# Patient Record
Sex: Female | Born: 1960 | Race: Black or African American | Hispanic: No | Marital: Single | Smoking: Never smoker
Health system: Southern US, Community
[De-identification: ages and names within clinical notes are randomized; demographics above are authoritative.]

## PROBLEM LIST (undated history)

## (undated) DIAGNOSIS — K469 Unspecified abdominal hernia without obstruction or gangrene: Secondary | ICD-10-CM

## (undated) DIAGNOSIS — M1990 Unspecified osteoarthritis, unspecified site: Secondary | ICD-10-CM

## (undated) DIAGNOSIS — F329 Major depressive disorder, single episode, unspecified: Secondary | ICD-10-CM

## (undated) DIAGNOSIS — J40 Bronchitis, not specified as acute or chronic: Secondary | ICD-10-CM

## (undated) DIAGNOSIS — K219 Gastro-esophageal reflux disease without esophagitis: Secondary | ICD-10-CM

## (undated) DIAGNOSIS — G4730 Sleep apnea, unspecified: Secondary | ICD-10-CM

## (undated) DIAGNOSIS — N840 Polyp of corpus uteri: Secondary | ICD-10-CM

## (undated) DIAGNOSIS — E669 Obesity, unspecified: Secondary | ICD-10-CM

## (undated) DIAGNOSIS — E7800 Pure hypercholesterolemia, unspecified: Secondary | ICD-10-CM

## (undated) DIAGNOSIS — I10 Essential (primary) hypertension: Secondary | ICD-10-CM

## (undated) DIAGNOSIS — F32A Depression, unspecified: Secondary | ICD-10-CM

## (undated) HISTORY — PX: CHOLECYSTECTOMY: SHX55

## (undated) HISTORY — PX: DILATION AND CURETTAGE OF UTERUS: SHX78

## (undated) HISTORY — DX: Essential (primary) hypertension: I10

## (undated) HISTORY — DX: Unspecified abdominal hernia without obstruction or gangrene: K46.9

## (undated) HISTORY — PX: UMBILICAL HERNIA REPAIR: SHX196

## (undated) HISTORY — DX: Obesity, unspecified: E66.9

## (undated) HISTORY — DX: Depression, unspecified: F32.A

## (undated) HISTORY — PX: OTHER SURGICAL HISTORY: SHX169

## (undated) HISTORY — DX: Unspecified osteoarthritis, unspecified site: M19.90

## (undated) HISTORY — DX: Gastro-esophageal reflux disease without esophagitis: K21.9

## (undated) HISTORY — DX: Major depressive disorder, single episode, unspecified: F32.9

## (undated) HISTORY — DX: Bronchitis, not specified as acute or chronic: J40

## (undated) HISTORY — DX: Polyp of corpus uteri: N84.0

---

## 1997-05-25 ENCOUNTER — Encounter: Admission: RE | Admit: 1997-05-25 | Discharge: 1997-05-25 | Payer: Self-pay | Admitting: Obstetrics & Gynecology

## 1997-05-25 ENCOUNTER — Other Ambulatory Visit: Admission: RE | Admit: 1997-05-25 | Discharge: 1997-05-25 | Payer: Self-pay | Admitting: Obstetrics & Gynecology

## 1998-12-01 ENCOUNTER — Encounter: Admission: RE | Admit: 1998-12-01 | Discharge: 1998-12-01 | Payer: Self-pay | Admitting: Hematology and Oncology

## 1999-01-16 ENCOUNTER — Inpatient Hospital Stay (HOSPITAL_COMMUNITY): Admission: EM | Admit: 1999-01-16 | Discharge: 1999-01-16 | Payer: Self-pay | Admitting: *Deleted

## 1999-02-28 ENCOUNTER — Encounter: Admission: RE | Admit: 1999-02-28 | Discharge: 1999-02-28 | Payer: Self-pay | Admitting: Obstetrics & Gynecology

## 1999-04-24 ENCOUNTER — Ambulatory Visit (HOSPITAL_COMMUNITY): Admission: RE | Admit: 1999-04-24 | Discharge: 1999-04-24 | Payer: Self-pay | Admitting: Obstetrics & Gynecology

## 1999-04-24 ENCOUNTER — Encounter (INDEPENDENT_AMBULATORY_CARE_PROVIDER_SITE_OTHER): Payer: Self-pay | Admitting: Specialist

## 1999-05-09 ENCOUNTER — Encounter: Admission: RE | Admit: 1999-05-09 | Discharge: 1999-05-09 | Payer: Self-pay | Admitting: Obstetrics & Gynecology

## 1999-06-29 ENCOUNTER — Encounter: Admission: RE | Admit: 1999-06-29 | Discharge: 1999-06-29 | Payer: Self-pay | Admitting: Internal Medicine

## 1999-08-27 ENCOUNTER — Emergency Department (HOSPITAL_COMMUNITY): Admission: EM | Admit: 1999-08-27 | Discharge: 1999-08-27 | Payer: Self-pay | Admitting: Emergency Medicine

## 1999-09-06 ENCOUNTER — Encounter: Admission: RE | Admit: 1999-09-06 | Discharge: 1999-09-06 | Payer: Self-pay | Admitting: Internal Medicine

## 1999-09-13 ENCOUNTER — Encounter: Admission: RE | Admit: 1999-09-13 | Discharge: 1999-12-12 | Payer: Self-pay | Admitting: *Deleted

## 1999-11-23 ENCOUNTER — Encounter: Admission: RE | Admit: 1999-11-23 | Discharge: 1999-11-23 | Payer: Self-pay | Admitting: Hematology and Oncology

## 1999-12-07 ENCOUNTER — Encounter: Admission: RE | Admit: 1999-12-07 | Discharge: 1999-12-07 | Payer: Self-pay | Admitting: Hematology and Oncology

## 2000-02-01 ENCOUNTER — Encounter: Admission: RE | Admit: 2000-02-01 | Discharge: 2000-02-01 | Payer: Self-pay | Admitting: Obstetrics

## 2000-03-20 ENCOUNTER — Encounter: Admission: RE | Admit: 2000-03-20 | Discharge: 2000-03-20 | Payer: Self-pay | Admitting: Internal Medicine

## 2000-05-08 ENCOUNTER — Encounter: Admission: RE | Admit: 2000-05-08 | Discharge: 2000-05-08 | Payer: Self-pay | Admitting: Internal Medicine

## 2000-06-05 ENCOUNTER — Encounter: Admission: RE | Admit: 2000-06-05 | Discharge: 2000-06-05 | Payer: Self-pay

## 2000-09-18 ENCOUNTER — Encounter: Admission: RE | Admit: 2000-09-18 | Discharge: 2000-09-18 | Payer: Self-pay | Admitting: Internal Medicine

## 2000-09-18 ENCOUNTER — Ambulatory Visit (HOSPITAL_COMMUNITY): Admission: RE | Admit: 2000-09-18 | Discharge: 2000-09-18 | Payer: Self-pay | Admitting: Internal Medicine

## 2000-09-18 ENCOUNTER — Encounter: Payer: Self-pay | Admitting: *Deleted

## 2000-09-18 ENCOUNTER — Ambulatory Visit (HOSPITAL_COMMUNITY): Admission: RE | Admit: 2000-09-18 | Discharge: 2000-09-18 | Payer: Self-pay | Admitting: *Deleted

## 2001-02-26 ENCOUNTER — Encounter: Admission: RE | Admit: 2001-02-26 | Discharge: 2001-02-26 | Payer: Self-pay

## 2001-04-09 ENCOUNTER — Encounter: Payer: Self-pay | Admitting: Emergency Medicine

## 2001-04-09 ENCOUNTER — Emergency Department (HOSPITAL_COMMUNITY): Admission: EM | Admit: 2001-04-09 | Discharge: 2001-04-09 | Payer: Self-pay | Admitting: Emergency Medicine

## 2001-04-11 ENCOUNTER — Encounter: Admission: RE | Admit: 2001-04-11 | Discharge: 2001-04-11 | Payer: Self-pay | Admitting: Internal Medicine

## 2001-05-14 ENCOUNTER — Encounter: Admission: RE | Admit: 2001-05-14 | Discharge: 2001-05-14 | Payer: Self-pay | Admitting: Internal Medicine

## 2001-07-17 ENCOUNTER — Encounter: Admission: RE | Admit: 2001-07-17 | Discharge: 2001-07-17 | Payer: Self-pay | Admitting: Internal Medicine

## 2001-08-21 ENCOUNTER — Encounter: Admission: RE | Admit: 2001-08-21 | Discharge: 2001-08-21 | Payer: Self-pay | Admitting: *Deleted

## 2001-10-17 ENCOUNTER — Encounter: Admission: RE | Admit: 2001-10-17 | Discharge: 2001-10-17 | Payer: Self-pay | Admitting: Internal Medicine

## 2001-10-17 ENCOUNTER — Ambulatory Visit (HOSPITAL_COMMUNITY): Admission: RE | Admit: 2001-10-17 | Discharge: 2001-10-17 | Payer: Self-pay | Admitting: Internal Medicine

## 2002-03-22 ENCOUNTER — Emergency Department (HOSPITAL_COMMUNITY): Admission: EM | Admit: 2002-03-22 | Discharge: 2002-03-22 | Payer: Self-pay | Admitting: Emergency Medicine

## 2002-03-22 ENCOUNTER — Encounter: Payer: Self-pay | Admitting: Emergency Medicine

## 2002-03-24 ENCOUNTER — Encounter: Admission: RE | Admit: 2002-03-24 | Discharge: 2002-03-24 | Payer: Self-pay | Admitting: Internal Medicine

## 2002-04-28 ENCOUNTER — Encounter: Admission: RE | Admit: 2002-04-28 | Discharge: 2002-04-28 | Payer: Self-pay | Admitting: Internal Medicine

## 2002-05-13 ENCOUNTER — Encounter: Admission: RE | Admit: 2002-05-13 | Discharge: 2002-05-13 | Payer: Self-pay | Admitting: Internal Medicine

## 2002-08-07 ENCOUNTER — Encounter: Admission: RE | Admit: 2002-08-07 | Discharge: 2002-08-07 | Payer: Self-pay | Admitting: Internal Medicine

## 2002-12-14 ENCOUNTER — Encounter: Admission: RE | Admit: 2002-12-14 | Discharge: 2002-12-14 | Payer: Self-pay | Admitting: Internal Medicine

## 2003-04-22 ENCOUNTER — Encounter: Admission: RE | Admit: 2003-04-22 | Discharge: 2003-04-22 | Payer: Self-pay | Admitting: Obstetrics and Gynecology

## 2003-04-29 ENCOUNTER — Ambulatory Visit (HOSPITAL_COMMUNITY): Admission: RE | Admit: 2003-04-29 | Discharge: 2003-04-29 | Payer: Self-pay | Admitting: Obstetrics and Gynecology

## 2003-04-29 ENCOUNTER — Encounter: Admission: RE | Admit: 2003-04-29 | Discharge: 2003-04-29 | Payer: Self-pay | Admitting: Internal Medicine

## 2004-01-12 ENCOUNTER — Ambulatory Visit: Payer: Self-pay | Admitting: Internal Medicine

## 2004-07-06 ENCOUNTER — Ambulatory Visit: Payer: Self-pay | Admitting: Internal Medicine

## 2004-08-01 ENCOUNTER — Encounter: Admission: RE | Admit: 2004-08-01 | Discharge: 2004-08-01 | Payer: Self-pay | Admitting: General Surgery

## 2004-08-01 ENCOUNTER — Ambulatory Visit (HOSPITAL_COMMUNITY): Admission: RE | Admit: 2004-08-01 | Discharge: 2004-08-01 | Payer: Self-pay | Admitting: General Surgery

## 2005-02-06 ENCOUNTER — Emergency Department (HOSPITAL_COMMUNITY): Admission: EM | Admit: 2005-02-06 | Discharge: 2005-02-06 | Payer: Self-pay | Admitting: Family Medicine

## 2005-09-21 ENCOUNTER — Ambulatory Visit: Payer: Self-pay | Admitting: Hospitalist

## 2005-10-03 ENCOUNTER — Ambulatory Visit: Payer: Self-pay | Admitting: Obstetrics & Gynecology

## 2005-10-05 ENCOUNTER — Encounter (INDEPENDENT_AMBULATORY_CARE_PROVIDER_SITE_OTHER): Payer: Self-pay | Admitting: Infectious Diseases

## 2005-10-05 LAB — CONVERTED CEMR LAB: Pap Smear: NORMAL

## 2005-10-14 ENCOUNTER — Inpatient Hospital Stay (HOSPITAL_COMMUNITY): Admission: AD | Admit: 2005-10-14 | Discharge: 2005-10-14 | Payer: Self-pay | Admitting: Obstetrics and Gynecology

## 2006-01-08 ENCOUNTER — Encounter (INDEPENDENT_AMBULATORY_CARE_PROVIDER_SITE_OTHER): Payer: Self-pay | Admitting: Infectious Diseases

## 2006-01-08 DIAGNOSIS — K439 Ventral hernia without obstruction or gangrene: Secondary | ICD-10-CM | POA: Insufficient documentation

## 2006-01-08 DIAGNOSIS — D179 Benign lipomatous neoplasm, unspecified: Secondary | ICD-10-CM | POA: Insufficient documentation

## 2006-01-08 DIAGNOSIS — K219 Gastro-esophageal reflux disease without esophagitis: Secondary | ICD-10-CM | POA: Insufficient documentation

## 2006-01-08 DIAGNOSIS — E785 Hyperlipidemia, unspecified: Secondary | ICD-10-CM

## 2006-01-08 DIAGNOSIS — J40 Bronchitis, not specified as acute or chronic: Secondary | ICD-10-CM

## 2006-01-08 DIAGNOSIS — F329 Major depressive disorder, single episode, unspecified: Secondary | ICD-10-CM

## 2006-01-08 DIAGNOSIS — N840 Polyp of corpus uteri: Secondary | ICD-10-CM

## 2006-01-08 DIAGNOSIS — M1990 Unspecified osteoarthritis, unspecified site: Secondary | ICD-10-CM | POA: Insufficient documentation

## 2006-01-08 DIAGNOSIS — I10 Essential (primary) hypertension: Secondary | ICD-10-CM | POA: Insufficient documentation

## 2006-01-08 HISTORY — DX: Polyp of corpus uteri: N84.0

## 2006-01-30 ENCOUNTER — Encounter (INDEPENDENT_AMBULATORY_CARE_PROVIDER_SITE_OTHER): Payer: Self-pay | Admitting: Infectious Diseases

## 2006-01-30 ENCOUNTER — Ambulatory Visit: Payer: Self-pay | Admitting: *Deleted

## 2006-01-30 ENCOUNTER — Ambulatory Visit: Payer: Self-pay | Admitting: Internal Medicine

## 2006-02-15 DIAGNOSIS — M170 Bilateral primary osteoarthritis of knee: Secondary | ICD-10-CM

## 2006-03-29 ENCOUNTER — Telehealth: Payer: Self-pay | Admitting: *Deleted

## 2006-04-24 ENCOUNTER — Telehealth (INDEPENDENT_AMBULATORY_CARE_PROVIDER_SITE_OTHER): Payer: Self-pay | Admitting: *Deleted

## 2006-08-05 ENCOUNTER — Encounter (INDEPENDENT_AMBULATORY_CARE_PROVIDER_SITE_OTHER): Payer: Self-pay | Admitting: Internal Medicine

## 2006-08-05 ENCOUNTER — Ambulatory Visit: Payer: Self-pay | Admitting: Internal Medicine

## 2006-08-05 DIAGNOSIS — M549 Dorsalgia, unspecified: Secondary | ICD-10-CM | POA: Insufficient documentation

## 2006-08-05 LAB — CONVERTED CEMR LAB
Albumin: 4 g/dL (ref 3.5–5.2)
Alkaline Phosphatase: 61 units/L (ref 39–117)
BUN: 7 mg/dL (ref 6–23)
Bilirubin Urine: NEGATIVE
Chloride: 100 meq/L (ref 96–112)
Creatinine, Ser: 0.62 mg/dL (ref 0.40–1.20)
HCT: 38.5 % (ref 36.0–46.0)
Hemoglobin, Urine: NEGATIVE
Hemoglobin: 12.8 g/dL (ref 12.0–15.0)
Leukocytes, UA: NEGATIVE
MCV: 94.1 fL (ref 78.0–100.0)
Nitrite: NEGATIVE
Protein, ur: NEGATIVE mg/dL
Sodium: 138 meq/L (ref 135–145)
Specific Gravity, Urine: 1.024 (ref 1.005–1.03)
Total Bilirubin: 0.3 mg/dL (ref 0.3–1.2)
Total Protein: 7.7 g/dL (ref 6.0–8.3)
Urine Glucose: NEGATIVE mg/dL
WBC: 6.1 10*3/uL (ref 4.0–10.5)

## 2006-08-08 ENCOUNTER — Telehealth (INDEPENDENT_AMBULATORY_CARE_PROVIDER_SITE_OTHER): Payer: Self-pay | Admitting: Internal Medicine

## 2006-08-20 ENCOUNTER — Telehealth: Payer: Self-pay | Admitting: *Deleted

## 2006-09-18 ENCOUNTER — Telehealth: Payer: Self-pay | Admitting: Internal Medicine

## 2006-09-30 ENCOUNTER — Ambulatory Visit: Payer: Self-pay | Admitting: Infectious Disease

## 2006-09-30 ENCOUNTER — Encounter (INDEPENDENT_AMBULATORY_CARE_PROVIDER_SITE_OTHER): Payer: Self-pay | Admitting: Infectious Diseases

## 2006-09-30 DIAGNOSIS — L293 Anogenital pruritus, unspecified: Secondary | ICD-10-CM

## 2006-09-30 LAB — CONVERTED CEMR LAB
Alkaline Phosphatase: 59 units/L (ref 39–117)
CO2: 28 meq/L (ref 19–32)
Chloride: 103 meq/L (ref 96–112)
Creatinine, Ser: 0.59 mg/dL (ref 0.40–1.20)
Glucose, Bld: 66 mg/dL — ABNORMAL LOW (ref 70–99)
LDL Cholesterol: 99 mg/dL (ref 0–99)
Potassium: 4 meq/L (ref 3.5–5.3)
VLDL: 30 mg/dL (ref 0–40)

## 2007-01-31 ENCOUNTER — Ambulatory Visit: Payer: Self-pay | Admitting: Internal Medicine

## 2007-01-31 ENCOUNTER — Telehealth (INDEPENDENT_AMBULATORY_CARE_PROVIDER_SITE_OTHER): Payer: Self-pay | Admitting: Infectious Diseases

## 2007-04-15 ENCOUNTER — Ambulatory Visit (HOSPITAL_COMMUNITY): Admission: RE | Admit: 2007-04-15 | Discharge: 2007-04-15 | Payer: Self-pay | Admitting: Obstetrics & Gynecology

## 2007-07-04 ENCOUNTER — Telehealth: Payer: Self-pay | Admitting: Internal Medicine

## 2007-07-09 ENCOUNTER — Encounter: Payer: Self-pay | Admitting: Internal Medicine

## 2007-08-11 ENCOUNTER — Ambulatory Visit: Payer: Self-pay | Admitting: *Deleted

## 2007-08-11 DIAGNOSIS — M25519 Pain in unspecified shoulder: Secondary | ICD-10-CM

## 2007-09-04 ENCOUNTER — Telehealth: Payer: Self-pay | Admitting: Internal Medicine

## 2007-09-23 ENCOUNTER — Telehealth: Payer: Self-pay | Admitting: Internal Medicine

## 2007-10-01 ENCOUNTER — Emergency Department (HOSPITAL_BASED_OUTPATIENT_CLINIC_OR_DEPARTMENT_OTHER): Admission: EM | Admit: 2007-10-01 | Discharge: 2007-10-01 | Payer: Self-pay | Admitting: Emergency Medicine

## 2007-10-24 ENCOUNTER — Emergency Department (HOSPITAL_BASED_OUTPATIENT_CLINIC_OR_DEPARTMENT_OTHER): Admission: EM | Admit: 2007-10-24 | Discharge: 2007-10-24 | Payer: Self-pay | Admitting: Emergency Medicine

## 2007-11-10 ENCOUNTER — Emergency Department (HOSPITAL_BASED_OUTPATIENT_CLINIC_OR_DEPARTMENT_OTHER): Admission: EM | Admit: 2007-11-10 | Discharge: 2007-11-10 | Payer: Self-pay | Admitting: Emergency Medicine

## 2007-11-12 ENCOUNTER — Ambulatory Visit: Payer: Self-pay | Admitting: Internal Medicine

## 2007-11-12 ENCOUNTER — Encounter: Payer: Self-pay | Admitting: Internal Medicine

## 2007-11-13 LAB — CONVERTED CEMR LAB
Albumin: 3.8 g/dL (ref 3.5–5.2)
CO2: 26 meq/L (ref 19–32)
Calcium: 10.5 mg/dL (ref 8.4–10.5)
Creatinine, Ser: 0.58 mg/dL (ref 0.40–1.20)
LDL Cholesterol: 78 mg/dL (ref 0–99)
Potassium: 4.3 meq/L (ref 3.5–5.3)
Total Protein: 7.1 g/dL (ref 6.0–8.3)
Triglycerides: 128 mg/dL (ref <150)
VLDL: 26 mg/dL (ref 0–40)

## 2007-11-26 ENCOUNTER — Emergency Department (HOSPITAL_COMMUNITY): Admission: EM | Admit: 2007-11-26 | Discharge: 2007-11-26 | Payer: Self-pay | Admitting: Family Medicine

## 2007-12-04 ENCOUNTER — Emergency Department (HOSPITAL_BASED_OUTPATIENT_CLINIC_OR_DEPARTMENT_OTHER): Admission: EM | Admit: 2007-12-04 | Discharge: 2007-12-04 | Payer: Self-pay | Admitting: Emergency Medicine

## 2007-12-09 ENCOUNTER — Telehealth: Payer: Self-pay | Admitting: Internal Medicine

## 2007-12-29 ENCOUNTER — Emergency Department (HOSPITAL_BASED_OUTPATIENT_CLINIC_OR_DEPARTMENT_OTHER): Admission: EM | Admit: 2007-12-29 | Discharge: 2007-12-29 | Payer: Self-pay | Admitting: Emergency Medicine

## 2008-01-19 ENCOUNTER — Telehealth: Payer: Self-pay | Admitting: Internal Medicine

## 2008-01-20 ENCOUNTER — Emergency Department (HOSPITAL_BASED_OUTPATIENT_CLINIC_OR_DEPARTMENT_OTHER): Admission: EM | Admit: 2008-01-20 | Discharge: 2008-01-20 | Payer: Self-pay | Admitting: Emergency Medicine

## 2008-01-20 ENCOUNTER — Ambulatory Visit: Payer: Self-pay | Admitting: Diagnostic Radiology

## 2008-02-09 ENCOUNTER — Telehealth: Payer: Self-pay | Admitting: Internal Medicine

## 2008-02-14 ENCOUNTER — Ambulatory Visit: Payer: Self-pay | Admitting: Diagnostic Radiology

## 2008-02-14 ENCOUNTER — Emergency Department (HOSPITAL_BASED_OUTPATIENT_CLINIC_OR_DEPARTMENT_OTHER): Admission: EM | Admit: 2008-02-14 | Discharge: 2008-02-14 | Payer: Self-pay | Admitting: Emergency Medicine

## 2008-03-02 ENCOUNTER — Telehealth (INDEPENDENT_AMBULATORY_CARE_PROVIDER_SITE_OTHER): Payer: Self-pay | Admitting: Internal Medicine

## 2008-04-05 ENCOUNTER — Telehealth: Payer: Self-pay | Admitting: Internal Medicine

## 2008-05-26 ENCOUNTER — Encounter: Payer: Self-pay | Admitting: Internal Medicine

## 2008-05-26 ENCOUNTER — Ambulatory Visit: Payer: Self-pay | Admitting: Internal Medicine

## 2008-05-26 DIAGNOSIS — J301 Allergic rhinitis due to pollen: Secondary | ICD-10-CM | POA: Insufficient documentation

## 2008-05-26 LAB — CONVERTED CEMR LAB
Albumin: 3.8 g/dL (ref 3.5–5.2)
BUN: 12 mg/dL (ref 6–23)
CO2: 26 meq/L (ref 19–32)
Chloride: 105 meq/L (ref 96–112)
Creatinine, Ser: 0.64 mg/dL (ref 0.40–1.20)
GFR calc non Af Amer: >60 mL/min (ref >60)
Total Bilirubin: 0.3 mg/dL (ref 0.3–1.2)
Total Protein: 7 g/dL (ref 6.0–8.3)

## 2008-07-23 ENCOUNTER — Emergency Department (HOSPITAL_BASED_OUTPATIENT_CLINIC_OR_DEPARTMENT_OTHER): Admission: EM | Admit: 2008-07-23 | Discharge: 2008-07-23 | Payer: Self-pay | Admitting: Emergency Medicine

## 2008-07-26 ENCOUNTER — Telehealth (INDEPENDENT_AMBULATORY_CARE_PROVIDER_SITE_OTHER): Payer: Self-pay | Admitting: Internal Medicine

## 2008-08-18 ENCOUNTER — Encounter: Payer: Self-pay | Admitting: Internal Medicine

## 2008-08-26 ENCOUNTER — Telehealth: Payer: Self-pay | Admitting: Internal Medicine

## 2008-09-24 ENCOUNTER — Ambulatory Visit: Payer: Self-pay | Admitting: Internal Medicine

## 2008-10-26 ENCOUNTER — Telehealth: Payer: Self-pay | Admitting: Internal Medicine

## 2008-11-23 ENCOUNTER — Telehealth: Payer: Self-pay | Admitting: Internal Medicine

## 2008-12-23 ENCOUNTER — Encounter: Payer: Self-pay | Admitting: Internal Medicine

## 2008-12-23 ENCOUNTER — Telehealth: Payer: Self-pay | Admitting: Internal Medicine

## 2008-12-23 ENCOUNTER — Ambulatory Visit: Payer: Self-pay | Admitting: Internal Medicine

## 2008-12-24 ENCOUNTER — Encounter: Payer: Self-pay | Admitting: Internal Medicine

## 2008-12-27 LAB — CONVERTED CEMR LAB
AST: 15 units/L (ref 0–37)
Albumin: 3.9 g/dL (ref 3.5–5.2)
Cholesterol: 149 mg/dL (ref 0–200)
HDL: 51 mg/dL (ref >39)
Sodium: 137 meq/L (ref 135–145)
Total Bilirubin: 0.4 mg/dL (ref 0.3–1.2)
Total CHOL/HDL Ratio: 2.9
Triglycerides: 160 mg/dL — ABNORMAL HIGH (ref <150)

## 2008-12-30 ENCOUNTER — Telehealth: Payer: Self-pay | Admitting: *Deleted

## 2009-01-03 ENCOUNTER — Telehealth: Payer: Self-pay | Admitting: Internal Medicine

## 2009-03-11 ENCOUNTER — Telehealth: Payer: Self-pay | Admitting: Internal Medicine

## 2009-04-04 ENCOUNTER — Telehealth: Payer: Self-pay | Admitting: Internal Medicine

## 2009-04-12 ENCOUNTER — Encounter: Payer: Self-pay | Admitting: Internal Medicine

## 2009-05-13 ENCOUNTER — Encounter: Payer: Self-pay | Admitting: Internal Medicine

## 2009-05-13 ENCOUNTER — Ambulatory Visit: Payer: Self-pay | Admitting: Internal Medicine

## 2009-05-13 DIAGNOSIS — R05 Cough: Secondary | ICD-10-CM

## 2009-05-13 DIAGNOSIS — R059 Cough, unspecified: Secondary | ICD-10-CM | POA: Insufficient documentation

## 2009-05-30 ENCOUNTER — Telehealth: Payer: Self-pay | Admitting: Internal Medicine

## 2009-06-12 ENCOUNTER — Emergency Department (HOSPITAL_BASED_OUTPATIENT_CLINIC_OR_DEPARTMENT_OTHER): Admission: EM | Admit: 2009-06-12 | Discharge: 2009-06-12 | Payer: Self-pay | Admitting: Emergency Medicine

## 2009-06-27 ENCOUNTER — Telehealth: Payer: Self-pay | Admitting: Internal Medicine

## 2009-07-26 ENCOUNTER — Telehealth: Payer: Self-pay | Admitting: Internal Medicine

## 2009-08-29 ENCOUNTER — Telehealth: Payer: Self-pay | Admitting: Internal Medicine

## 2009-09-13 ENCOUNTER — Emergency Department (HOSPITAL_BASED_OUTPATIENT_CLINIC_OR_DEPARTMENT_OTHER): Admission: EM | Admit: 2009-09-13 | Discharge: 2009-09-13 | Payer: Self-pay | Admitting: Emergency Medicine

## 2009-09-13 ENCOUNTER — Ambulatory Visit: Payer: Self-pay | Admitting: Radiology

## 2009-11-21 ENCOUNTER — Telehealth: Payer: Self-pay | Admitting: Internal Medicine

## 2010-01-02 ENCOUNTER — Encounter: Payer: Self-pay | Admitting: Internal Medicine

## 2010-01-11 ENCOUNTER — Telehealth (INDEPENDENT_AMBULATORY_CARE_PROVIDER_SITE_OTHER): Payer: Self-pay | Admitting: *Deleted

## 2010-01-16 ENCOUNTER — Encounter: Payer: Self-pay | Admitting: Internal Medicine

## 2010-01-18 ENCOUNTER — Ambulatory Visit: Payer: Self-pay | Admitting: Internal Medicine

## 2010-01-18 LAB — CONVERTED CEMR LAB
ALT: 10 units/L (ref 0–35)
AST: 15 units/L (ref 0–37)
BUN: 8 mg/dL (ref 6–23)
Calcium: 9.8 mg/dL (ref 8.4–10.5)
Chloride: 100 meq/L (ref 96–112)
Cholesterol: 157 mg/dL (ref 0–200)
Glucose, Bld: 96 mg/dL (ref 70–99)
HDL: 52 mg/dL (ref >39)
Triglycerides: 138 mg/dL (ref <150)
VLDL: 28 mg/dL (ref 0–40)

## 2010-01-26 ENCOUNTER — Telehealth: Payer: Self-pay | Admitting: Internal Medicine

## 2010-03-09 ENCOUNTER — Other Ambulatory Visit: Payer: Self-pay | Admitting: Obstetrics & Gynecology

## 2010-03-09 DIAGNOSIS — Z3490 Encounter for supervision of normal pregnancy, unspecified, unspecified trimester: Secondary | ICD-10-CM

## 2010-03-12 ENCOUNTER — Encounter: Payer: Self-pay | Admitting: *Deleted

## 2010-03-12 ENCOUNTER — Encounter: Payer: Self-pay | Admitting: Obstetrics & Gynecology

## 2010-03-12 ENCOUNTER — Encounter: Payer: Self-pay | Admitting: Infectious Diseases

## 2010-03-13 ENCOUNTER — Encounter: Payer: Self-pay | Admitting: Infectious Diseases

## 2010-03-13 ENCOUNTER — Encounter: Payer: Self-pay | Admitting: Surgery

## 2010-03-23 NOTE — Progress Notes (Signed)
Summary: med refill/gp  Phone Note Refill Request Message from:  Fax from Pharmacy on Jun 27, 2009 2:14 PM  Refills Requested: Medication #1:  OMEPRAZOLE 20 MG CPDR Take 1 tablet by mouth two times a day   Last Refilled: 05/28/2009  Medication #2:    Method Requested: Electronic Initial call taken by: Chinita Pester RN,  Jun 27, 2009 2:14 PM  Follow-up for Phone Call        Rx faxed to pharmacy Follow-up by: Mariea Stable MD,  Jun 29, 2009 8:31 AM    Prescriptions: OMEPRAZOLE 20 MG CPDR (OMEPRAZOLE) Take 1 tablet by mouth two times a day  #60 Capsule x 2   Entered and Authorized by:   Mariea Stable MD   Signed by:   Mariea Stable MD on 06/29/2009   Method used:   [facility]        [facility] [phone][facility] (retail)       [address]       [place], [place]  [address]       Ph: [phone] or [phone]       Fax: [phone]   RxID:   [id number]

## 2010-03-23 NOTE — Progress Notes (Signed)
Summary: refill/gg  Phone Note Refill Request  on August 29, 2009 12:26 PM  Refills Requested: Medication #1:  TRAMADOL HCL 50 MG  TABS take 1 tablet every 6 hours as needed for pain.   Last Refilled: 07/27/2009  Method Requested: Electronic Initial call taken by: Merrie Roof RN,  August 29, 2009 12:26 PM  Follow-up for Phone Call        Rx faxed to pharmacy Follow-up by: Mariea Stable MD,  August 29, 2009 5:05 PM    Prescriptions: TRAMADOL HCL 50 MG  TABS (TRAMADOL HCL) take 1 tablet every 6 hours as needed for pain.  #40 Tablet x 0   Entered and Authorized by:   Mariea Stable MD   Signed by:   Mariea Stable MD on 08/29/2009   Method used:   Electronically to        [facility] [phone]* (retail)       [address]       [place], [place]  [address]       Ph: [phone] or [phone]       Fax: [phone]   RxID:   [id number]

## 2010-03-23 NOTE — Assessment & Plan Note (Signed)
Summary: checkup [mkj]   Vital Signs:  Patient profile:   50 year old female Height:      65 inches (165.10 cm) Weight:      365.2 pounds (166.00 kg) BMI:     60.99 Temp:     98.5 degrees F oral Pulse rate:   84 / minute BP sitting:   141 / 83  (right arm)  Vitals Entered By: Chinita Pester RN (May 13, 2009 2:40 PM) CC: Check-up. Sorethroat. Difficulty sleeping at night. Coughing.  ? sinus or a cold.  Also dropping objects  Dizziness when she stands up. Is Patient Diabetic? No Pain Assessment Patient in pain? yes     Location: throat Intensity: 4 Type: sore Onset of pain  Constant Nutritional Status BMI of > 30 = obese  Have you ever been in a relationship where you felt threatened, hurt or afraid?No   Does patient need assistance? Functional Status Self care Ambulation Normal   Primary Care Provider:  Ronda Fairly MD  CC:  Check-up. Sorethroat. Difficulty sleeping at night. Coughing.  ? sinus or a cold.  Also dropping objects  Dizziness when she stands up.Marland Kitchen  History of Present Illness: Ellen [name] is a 50 yo woman with PMH as outlined in chart.  She is here for routine visit.  She has a few complaints today.  1.  Headache, itchy/watery eyes, right ear hurts and feels plugged up, and sore throat.  Also coughing, dry.  No fever, chills N/V/D.  Started 4 days ago.  OTC meds not helping.  2.  Tingling: right jaw, right hand (not the arm), and right calf area.  States there are times she is holding something with her right hand and drops it unintentionally.  Ongoing for about 1 month now.  No weakness.   Taking all meds, but reports pharmacy did not recieve fill on tramadol and is out of flonase.  Depression History:      The patient denies a depressed mood most of the day and a diminished interest in her usual daily activities.         Preventive Screening-Counseling & Management  Alcohol-Tobacco     Alcohol drinks/day: 0     Smoking Status:  never  Caffeine-Diet-Exercise     Does Patient Exercise: yes     Type of exercise: WALKING     Exercise (avg: min/session): 6     Times/week: 3  Current Medications (verified): 1)  Zocor 80 Mg  Tabs (Simvastatin) .... Take 1 Tablet At Bedtime 2)  Hydrochlorothiazide 25 Mg Tabs (Hydrochlorothiazide) .... Take 1 Tablet By Mouth Once A Day 3)  Enalapril Maleate 20 Mg Tabs (Enalapril Maleate) .... Take 2 Tablets By Mouth Once A Day 4)  Lexapro 20 Mg Tabs (Escitalopram Oxalate) .... Take 1 Tablet By Mouth Once A Day 5)  Adult Aspirin Low Strength 81 Mg  Chew (Aspirin) .... Take 1 Tablet By Mouth Once A Day 6)  Tramadol Hcl 50 Mg  Tabs (Tramadol Hcl) .... Take 1 Tablet Every 6 Hours As Needed For Pain. 7)  Omeprazole 20 Mg Cpdr (Omeprazole) .... Take 1 Tablet By Mouth Two Times A Day 8)  Flonase 50 Mcg/act Susp (Fluticasone Propionate) .... Apply 2 Sprays in Each Nostril At Bedtime. 9)  Zyrtec Allergy 10 Mg Tabs (Cetirizine Hcl) .... Take 1 Tablet By Mouth Once A Day  Allergies (verified): 1)  ! Codeine  Past History:  Past Medical History: Last updated: 01/08/2006 Depression GERD Hypertension Lipoma Degenerative joint  disease Bronchitis, hx of Obesity Hernia Benign endometrial polyp Dislipidemia  Past Surgical History: Last updated: 01/08/2006 Cholecystectomy Resection biopsy of lipoma Umbilical hernai repair 2000  Social History: Last updated: 12/23/2008 Occupation:  unemployed Single Never Smoked Alcohol use-no, former Drug use-no  Risk Factors: Smoking Status: never (05/13/2009)  Review of Systems      See HPI  Physical Exam  General:  morbidly obese, very pleasant woman, NAD.   Eyes:  pupils equal, pupils round, and pupils reactive to light.  conjunctival injection bilateral eyes Ears:  R ear normal and L ear normal.   Nose:  no external deformity, no external erythema, no nasal discharge, and no mucosal edema.   Mouth:  pharynx pink and moist, no  erythema, and no exudates.   Neck:  supple, no thyromegaly, no JVD, no carotid bruits, and no cervical lymphadenopathy.   Lungs:  normal respiratory effort, no accessory muscle use, normal breath sounds, no crackles, and no wheezes.   Heart:  normal rate, regular rhythm, no murmur, no gallop, no rub, and no JVD.   Abdomen:  normal bowel sounds.   Extremities:  +1 bilateral LE edema Neurologic:  alert & oriented X3, cranial nerves II-XII intact, strength normal in all extremities, and gait normal.    senstation to light touch, pin prick and vibration intact. Cervical Nodes:  no anterior cervical adenopathy and no posterior cervical adenopathy.   Psych:  Oriented X3, memory intact for recent and remote, normally interactive, good eye contact, not anxious appearing, and not depressed appearing.     Impression & Recommendations:  Problem # 1:  COUGH (ICD-786.2) likely related to URI vs allergies. already on zyrtec and flonase (though out of flonase, will refill) will give short supply of tessalon  Problem # 2:  OBESITY, MORBID (ICD-278.01) please see instructions. lengthy discussion including possible meds, but I do not generally feel this is appropriate due to low efficacy and possible side effects.. pt is agreeable to not starting meds.  Problem # 3:  DYSLIPIDEMIA (ICD-272.4) at goal check LFTs and Lipids on f/u visit  Her updated medication list for this problem includes:    Zocor 80 Mg Tabs (Simvastatin) .Marland Kitchen... Take 1 tablet at bedtime  Labs Reviewed: SGOT: 15 (12/23/2008)   SGPT: 11 (12/23/2008)   HDL:51 (12/23/2008), 57 (11/12/2007)  LDL:66 (12/23/2008), 78 (11/12/2007)  Chol:149 (12/23/2008), 161 (11/12/2007)  Trig:160 (12/23/2008), 128 (11/12/2007)  Problem # 4:  HYPERTENSION (ICD-401.9) pt has been well controlled in past likely elevated due to OTC cough medicines (decongestant) will monitor  Her updated medication list for this problem includes:    Hydrochlorothiazide 25  Mg Tabs (Hydrochlorothiazide) .Marland Kitchen... Take 1 tablet by mouth once a day    Enalapril Maleate 20 Mg Tabs (Enalapril maleate) .Marland Kitchen... Take 2 tablets by mouth once a day  BP today: 141/83 Prior BP: 126/81 (12/23/2008)  Labs Reviewed: K+: 4.2 (12/23/2008) Creat: : 0.61 (12/23/2008)   Chol: 149 (12/23/2008)   HDL: 51 (12/23/2008)   LDL: 66 (12/23/2008)   TG: 160 (12/23/2008)  Problem # 5:  PREVENTIVE HEALTH CARE (ICD-V70.0) mammo due in July refused flu shot sees Dr. Tamela Oddi gyn (request records)  Problem # 6:  BACK PAIN (ICD-724.5) atable on tramadol  Her updated medication list for this problem includes:    Adult Aspirin Low Strength 81 Mg Chew (Aspirin) .Marland Kitchen... Take 1 tablet by mouth once a day    Tramadol Hcl 50 Mg Tabs (Tramadol hcl) .Marland Kitchen... Take 1 tablet every 6 hours  as needed for pain.  Complete Medication List: 1)  Zocor 80 Mg Tabs (Simvastatin) .... Take 1 tablet at bedtime 2)  Hydrochlorothiazide 25 Mg Tabs (Hydrochlorothiazide) .... Take 1 tablet by mouth once a day 3)  Enalapril Maleate 20 Mg Tabs (Enalapril maleate) .... Take 2 tablets by mouth once a day 4)  Lexapro 20 Mg Tabs (Escitalopram oxalate) .... Take 1 tablet by mouth once a day 5)  Adult Aspirin Low Strength 81 Mg Chew (Aspirin) .... Take 1 tablet by mouth once a day 6)  Tramadol Hcl 50 Mg Tabs (Tramadol hcl) .... Take 1 tablet every 6 hours as needed for pain. 7)  Omeprazole 20 Mg Cpdr (Omeprazole) .... Take 1 tablet by mouth two times a day 8)  Flonase 50 Mcg/act Susp (Fluticasone propionate) .... Apply 2 sprays in each nostril at bedtime. 9)  Zyrtec Allergy 10 Mg Tabs (Cetirizine hcl) .... Take 1 tablet by mouth once a day 10)  Tessalon Perles 100 Mg Caps (Benzonatate) .... Take 1 tablet by mouth three times a day as needed cough  Patient Instructions: 1)  Please schedule a follow-up appointment in 6 months. 2)  Doing well overall. 3)  Continue your medications listed below. 4)  Have sent a prescription for  tessalon for your cough, take as directed. 5)  I have refilled your flonase and tramadol. 6)  Make sure to get your mammogram in July. 7)  Keep track of your calories every day on a notepad.  Once you have an idea of how many calories you are eating, cut back by 500 calories per day.  This should equal about 1 pound per week if you do it correctly.  8)  As for the numbness, I do not think it is anything concerning.  If it gets worse or you have any other symptoms, call clinic.  Prescriptions: FLONASE 50 MCG/ACT SUSP (FLUTICASONE PROPIONATE) apply 2 sprays in each nostril at bedtime.  #1 x 2   Entered and Authorized by:   Mariea Stable MD   Signed by:   Mariea Stable MD on 05/13/2009   Method used:   [facility] to        [facility] [phone]* (retail)       [address]       [place], [place]  [address]       Ph: [phone] or [phone]       Fax: [phone]   RxID:   [id number] TRAMADOL HCL 50 MG  TABS (TRAMADOL HCL) take 1 tablet every 6 hours as needed for pain.  #40 Tablet x 1   Entered and Authorized by:   Mariea Stable MD   Signed by:   Mariea Stable MD on 05/13/2009   Method used:   [facility]ronically to        [facility] [phone]* (retail)       [address]       [place], [place]  [address]       Ph: [phone] or [phone]       Fax: [phone]   RxID:   [id number] TESSALON PERLES 100 MG CAPS (BENZONATATE) Take 1 tablet by mouth three times a day as needed cough  #30 x 0   Entered and Authorized by:   Mariea Stable MD   Signed by:   Mariea Stable MD on 05/13/2009   Method used:  Electronically to        [id number] (retail)       [address]       [place], [place]  [address]       Ph: [phone] or [phone]       Fax: [phone]   RxID:   [id number]   Prevention & Chronic Care Immunizations   Influenza vaccine: refuses  (11/12/2007)   Influenza  vaccine deferral: Refused  (05/13/2009)    Tetanus booster: 03/22/2008: Historical    Pneumococcal vaccine: Not documented  Other Screening   Pap smear: Normal  (10/05/2005)    Mammogram: Not documented   Mammogram action/deferral: Screening mammogram in 1 year.     (08/18/2008)   Mammogram due: 08/2009  Reports requested:   Last Pap report requested.  Smoking status: never  (05/13/2009)  Lipids   Total Cholesterol: 149  (12/23/2008)   LDL: 66  (12/23/2008)   LDL Direct: Not documented   HDL: 51  (12/23/2008)   Triglycerides: 160  (12/23/2008)    SGOT (AST): 15  (12/23/2008)   SGPT (ALT): 11  (12/23/2008)   Alkaline phosphatase: 54  (12/23/2008)   Total bilirubin: 0.4  (12/23/2008)    Lipid flowsheet reviewed?: Yes   Progress toward LDL goal: At goal  Hypertension   Last Blood Pressure: 141 / 83  (05/13/2009)   Serum creatinine: 0.61  (12/23/2008)   Serum potassium 4.2  (12/23/2008)    Hypertension flowsheet reviewed?: Yes   Progress toward BP goal: At goal  Self-Management Support :   Personal Goals (by the next clinic visit) :      Personal blood pressure goal: 140/90  (05/13/2009)     Personal LDL goal: 100  (05/13/2009)    Patient will work on the following items until the next clinic visit to reach self-care goals:     Medications and monitoring: check my blood pressure, weigh myself weekly  (05/13/2009)     Eating: eat more vegetables, use fresh or frozen vegetables, eat baked foods instead of fried foods  (05/13/2009)     Activity: take a 30 minute walk every day  (05/13/2009)    Hypertension self-management support: Education handout, Resources for patients handout, Written self-care plan  (05/13/2009)   Hypertension self-care plan printed.   Hypertension education handout printed    Lipid self-management support: Education handout, Resources for patients handout, Written self-care plan  (05/13/2009)   Lipid self-care plan printed.   Lipid education  handout printed      Resource handout printed.   Nursing Instructions: Request report of last Pap     Appended Document: checkup [mkj] Received last PAP result; to be scanned into pt.'s chart.

## 2010-03-23 NOTE — Progress Notes (Signed)
Summary: refill/ hla  Phone Note Refill Request Message from:  Patient on January 26, 2010 2:06 PM  Refills Requested: Medication #1:  TRAMADOL HCL 50 MG  TABS take 1 tablet every 6 hours as needed for pain.   Dosage confirmed as above?Dosage Confirmed   Last Refilled: 10/4 last visit 11/30  Initial call taken by: Marin Roberts RN,  January 26, 2010 2:06 PM  Follow-up for Phone Call        Rx faxed to pharmacy Follow-up by: Mariea Stable MD,  January 27, 2010 10:24 AM    Prescriptions: TRAMADOL HCL 50 MG  TABS (TRAMADOL HCL) take 1 tablet every 6 hours as needed for pain.  #40 Tablet x 0   Entered and Authorized by:   Mariea Stable MD   Signed by:   Mariea Stable MD on 01/27/2010   Method used:   Electronically to        [facility] [phone]* (retail)       [address]       [place], [place]  [address]       Ph: [phone] or [phone]       Fax: [phone]   RxID:   [id number]

## 2010-03-23 NOTE — Assessment & Plan Note (Signed)
Summary: BACK PAIN/SB.   Vital Signs:  Patient profile:   50 year old female Height:      65 inches (165.10 cm) Weight:      377.7 pounds (171.68 kg) BMI:     63.08 Temp:     98.0 degrees F (36.67 degrees C) oral Pulse rate:   77 / minute BP sitting:   123 / 78  (right arm)  Vitals Entered By: Stanton Kidney Ditzler RN (January 18, 2010 11:41 AM) Is Patient Diabetic? No Pain Assessment Patient in pain? yes     Location: both knees Intensity: 8 Type: sharp Onset of pain  since fall 11/2009 Nutritional Status BMI of > 30 = obese Nutritional Status Detail appetite good  Have you ever been in a relationship where you felt threatened, hurt or afraid?denies   Does patient need assistance? Functional Status Self care Ambulation Normal Comments Since fall 11/2009 - problems with stiffness and both knees.   Primary Care Madden Piazza:  Ronda Fairly MD   History of Present Illness: 50 y/o woman with PMH significant for OA, depression, HTN, HLD comes to the clinic for a f/u visit  She complains of pain and stiffness in her both knees, which is worse after she fell down in october.  Denies abdominal pain/N/V/D/fever/cough.  Depression History:      The patient denies a depressed mood most of the day and a diminished interest in her usual daily activities.         Preventive Screening-Counseling & Management  Alcohol-Tobacco     Alcohol drinks/day: 0     Smoking Status: never  Caffeine-Diet-Exercise     Does Patient Exercise: yes     Type of exercise: WALKING     Exercise (avg: min/session): 6     Times/week: 3  Current Medications (verified): 1)  Zocor 80 Mg  Tabs (Simvastatin) .... Take 1 Tablet At Bedtime 2)  Hydrochlorothiazide 25 Mg Tabs (Hydrochlorothiazide) .... Take 1 Tablet By Mouth Once A Day 3)  Enalapril Maleate 20 Mg Tabs (Enalapril Maleate) .... Take 2 Tablets By Mouth Once A Day 4)  Lexapro 20 Mg Tabs (Escitalopram Oxalate) .... Take 1 Tablet By Mouth Once A  Day 5)  Adult Aspirin Low Strength 81 Mg  Chew (Aspirin) .... Take 1 Tablet By Mouth Once A Day 6)  Tramadol Hcl 50 Mg  Tabs (Tramadol Hcl) .... Take 1 Tablet Every 6 Hours As Needed For Pain. 7)  Omeprazole 20 Mg Cpdr (Omeprazole) .... Take 1 Tablet By Mouth Two Times A Day 8)  Flonase 50 Mcg/act Susp (Fluticasone Propionate) .... Apply 2 Sprays in Each Nostril At Bedtime. 9)  Zyrtec Allergy 10 Mg Tabs (Cetirizine Hcl) .... Take 1 Tablet By Mouth Once A Day 10)  Tessalon Perles 100 Mg Caps (Benzonatate) .... Take 1 Tablet By Mouth Three Times A Day As Needed Cough  Allergies: 1)  ! Codeine  Review of Systems      See HPI  The patient denies anorexia, fever, decreased hearing, hoarseness, chest pain, syncope, dyspnea on exertion, peripheral edema, headaches, hemoptysis, abdominal pain, and hematochezia.    Physical Exam  General:  alert , morbily obese, alert, well-nourished, and well-hydrated.   Head:  normocephalic and atraumatic.   Eyes:  vision grossly intact, pupils equal, pupils round, and pupils reactive to light.   Mouth:  pharynx pink and moist.   Neck:  supple and full ROM.   Lungs:  normal respiratory effort, no intercostal retractions, no accessory muscle use,  normal breath sounds, no dullness, no fremitus, no crackles, and no wheezes.   Heart:  normal rate, regular rhythm, no murmur, no gallop, and no rub.   Abdomen:  soft, non-tender, normal bowel sounds, no distention, no masses, no guarding, and no rigidity.   Msk:  R and L knee normal ROM, no joint tenderness, no joint swelling, no joint warmth, and no redness over joints.   Pulses:  2+pulses b/l. Extremities:  no cyanosis, clubbing or edema. Neurologic:  alert & oriented X3, cranial nerves II-XII intact, strength normal in all extremities, sensation intact to light touch, gait normal, and DTRs symmetrical and normal.     Impression & Recommendations:  Problem # 1:  DEGENERATIVE JOINT DISEASE, KNEES, BILATERAL  (ICD-715.96) Assessment Comment Only She complains of joint stiffness and pain, which is most likely due her OA. She is morbidly obese and understands that weight reduction will help allevating her symptoms. She also understands that the ultimate treatment to her joint disease is surgery, for which to be qualified she needs to reduce her weight. She seems highly motivated and was encouraged to loose her weight. Will also make a PT referral today. Her updated medication list for this problem includes:    Adult Aspirin Low Strength 81 Mg Chew (Aspirin) .Marland Kitchen... Take 1 tablet by mouth once a day    Tramadol Hcl 50 Mg Tabs (Tramadol hcl) .Marland Kitchen... Take 1 tablet every 6 hours as needed for pain.  Orders: Physical Therapy Referral (PT)  Discussed strengthening exercises, use of ice or heat, and medications.   Problem # 2:  HYPERTENSION (ICD-401.9) Assessment: Comment Only Well controlled. Continue current treatment. Her updated medication list for this problem includes:    Hydrochlorothiazide 25 Mg Tabs (Hydrochlorothiazide) .Marland Kitchen... Take 1 tablet by mouth once a day    Enalapril Maleate 20 Mg Tabs (Enalapril maleate) .Marland Kitchen... Take 2 tablets by mouth once a day  Orders: T-Comprehensive Metabolic Panel ([id number])  BP today: 123/78 Prior BP: 141/83 (05/13/2009)  Labs Reviewed: K+: 4.2 (12/23/2008) Creat: : 0.61 (12/23/2008)   Chol: 149 (12/23/2008)   HDL: 51 (12/23/2008)   LDL: 66 (12/23/2008)   TG: 160 (12/23/2008)  Problem # 3:  DYSLIPIDEMIA (ICD-272.4) Assessment: Comment Only Will check her lipid panel ad CMP . Her updated medication list for this problem includes:    Zocor 80 Mg Tabs (Simvastatin) .Marland Kitchen... Take 1 tablet at bedtime  Orders: T-Comprehensive Metabolic Panel [id number]) T-Lipid Profile ([id number])  Problem # 4:  PREVENTIVE HEALTH CARE (ICD-V70.0) Assessment: Comment Only She refused flu shot, pneumococcal shot. Will refer her to GI for  colonoscopy. Orders: Gastroenterology Referral (GI)  Complete Medication List: 1)  Zocor 80 Mg Tabs (Simvastatin) .... Take 1 tablet at bedtime 2)  Hydrochlorothiazide 25 Mg Tabs (Hydrochlorothiazide) .... Take 1 tablet by mouth once a day 3)  Enalapril Maleate 20 Mg Tabs (Enalapril maleate) .... Take 2 tablets by mouth once a day 4)  Lexapro 20 Mg Tabs (Escitalopram oxalate) .... Take 1 tablet by mouth once a day 5)  Adult Aspirin Low Strength 81 Mg Chew (Aspirin) .... Take 1 tablet by mouth once a day 6)  Tramadol Hcl 50 Mg Tabs (Tramadol hcl) .... Take 1 tablet every 6 hours as needed for pain. 7)  Omeprazole 20 Mg Cpdr (Omeprazole) .... Take 1 tablet by mouth two times a day 8)  Flonase 50 Mcg/act Susp (Fluticasone propionate) .... Apply 2 sprays in each nostril at bedtime. 9)  Zyrtec Allergy 10 Mg Tabs (Cetirizine hcl) .Marland KitchenMarland KitchenMarland Kitchen  Take 1 tablet by mouth once a day 10)  Tessalon Perles 100 Mg Caps (Benzonatate) .... Take 1 tablet by mouth three times a day as needed cough  Patient Instructions: 1)  Please take your medicines as prescribed. 2)  Please schedule a follow-up appointment in 3 month.   Orders Added: 1)  Gastroenterology Referral [GI] 2)  Physical Therapy Referral [PT] 3)  T-Comprehensive Metabolic Panel [[id number]] 4)  T-Lipid Profile [[id number]] 5)  Est. Patient Level IV [47829]   Process Orders Check Orders Results:     [facility]: Check successful Tests Sent for requisitioning (January 21, 2010 3:09 PM):     01/18/2010: [facility] -- T-Comprehensive Metabolic Panel [[id number]] (signed)     01/18/2010: [facility] -- T-Lipid Profile [phone] (signed)     Prevention & Chronic Care Immunizations   Influenza vaccine: refuses  (11/12/2007)   Influenza vaccine deferral: Refused  (01/18/2010)    Tetanus booster: 03/22/2008: Historical    Pneumococcal vaccine: Not documented  Colorectal Screening    Hemoccult: Not documented    Colonoscopy: Not documented  Other Screening   Pap smear: Interpretation/Result:Negative for intraepithelial Lesion or Malignancy.     (04/12/2008)    Mammogram: Not documented   Mammogram action/deferral: Screening mammogram in 1 year.     (08/18/2008)   Mammogram due: 08/2009   Smoking status: never  (01/18/2010)  Lipids   Total Cholesterol: 149  (12/23/2008)   LDL: 66  (12/23/2008)   LDL Direct: Not documented   HDL: 51  (12/23/2008)   Triglycerides: 160  (12/23/2008)    SGOT (AST): 15  (12/23/2008)   SGPT (ALT): 11  (12/23/2008) CMP ordered    Alkaline phosphatase: 54  (12/23/2008)   Total bilirubin: 0.4  (12/23/2008)  Hypertension   Last Blood Pressure: 123 / 78  (01/18/2010)   Serum creatinine: 0.61  (12/23/2008)   Serum potassium 4.2  (12/23/2008) CMP ordered   Self-Management Support :   Personal Goals (by the next clinic visit) :      Personal blood pressure goal: 140/90  (05/13/2009)     Personal LDL goal: 100  (05/13/2009)    Patient will work on the following items until the next clinic visit to reach self-care goals:     Medications and monitoring: take my medicines every day, check my blood pressure, bring all of my medications to every visit, weigh myself weekly  (01/18/2010)     Eating: drink diet soda or water instead of juice or soda, eat more vegetables, use fresh or frozen vegetables, eat foods that are low in salt, eat baked foods instead of fried foods, eat fruit for snacks and desserts  (01/18/2010)     Activity: take a 30 minute walk every day  (01/18/2010)    Hypertension self-management support: Written self-care plan, Education handout, Resources for patients handout  (01/18/2010)   Hypertension self-care plan printed.   Hypertension education handout printed    Lipid self-management support: Written self-care plan, Education handout, Resources for patients handout  (01/18/2010)   Lipid self-care plan  printed.   Lipid education handout printed      Resource handout printed.

## 2010-03-23 NOTE — Miscellaneous (Signed)
Summary: No further refills until seen  Pt cancelled 9/7, then No Showed 9/8, 10/19, and 10/31.  Gave 1 mo refill, and will not provide any more until seen.  Front desk to address issue with cancellations/No Shows per policy.  Clinical Lists Changes

## 2010-03-23 NOTE — Progress Notes (Signed)
Summary: refill/ hla  Phone Note Refill Request Message from:  Patient on March 11, 2009 2:16 PM  Refills Requested: Medication #1:  OMEPRAZOLE 20 MG CPDR Take 1 tablet by mouth two times a day Initial call taken by: Marin Roberts RN,  March 11, 2009 2:16 PM  Follow-up for Phone Call        Rx faxed to pharmacy Follow-up by: Mariea Stable MD,  March 11, 2009 2:31 PM    Prescriptions: OMEPRAZOLE 20 MG CPDR (OMEPRAZOLE) Take 1 tablet by mouth two times a day  #60 Capsule x 2   Entered and Authorized by:   Mariea Stable MD   Signed by:   Mariea Stable MD on 03/11/2009   Method used:   [facility]ronically to        [facility] [phone]* (retail)       [address]       [place], [place]  [address]       Ph: [phone] or [phone]       Fax: [phone]   RxID:   [id number]

## 2010-03-23 NOTE — Progress Notes (Signed)
Summary: med refill/gp  Phone Note Refill Request Message from:  Fax from Pharmacy on January 11, 2010 10:19 AM  Refills Requested: Medication #1:  HYDROCHLOROTHIAZIDE 25 MG TABS Take 1 tablet by mouth once a day   Last Refilled: 12/09/2009  Medication #2:  ENALAPRIL MALEATE 20 MG TABS Take 2 tablets by mouth once a day   Last Refilled: 12/09/2009 Last appt. 09/07/09.   Method Requested: Electronic Initial call taken by: Chinita Pester RN,  January 11, 2010 10:19 AM    Prescriptions: ENALAPRIL MALEATE 20 MG TABS (ENALAPRIL MALEATE) Take 2 tablets by mouth once a day  #60 Tablet x 11   Entered and Authorized by:   Zoila Shutter MD   Signed by:   Zoila Shutter MD on 01/11/2010   Method used:   Electronically to        [facility] [phone]* (retail)       [address]       [place], [place]  [address]       Ph: [phone] or [phone]       Fax: [phone]   RxID:   [id number] HYDROCHLOROTHIAZIDE 25 MG TABS (HYDROCHLOROTHIAZIDE) Take 1 tablet by mouth once a day  #30 Tablet x 11   Entered and Authorized by:   Zoila Shutter MD   Signed by:   Zoila Shutter MD on 01/11/2010   Method used:   Electronically to        [facility] [phone]* (retail)       [address]       [place], [place]  [address]       Ph: [phone] or [phone]       Fax: [phone]   RxID:   [id number]

## 2010-03-23 NOTE — Progress Notes (Signed)
Summary: med refill/gp  Phone Note Refill Request Message from:  Fax from Pharmacy on July 26, 2009 9:25 AM  Refills Requested: Medication #1:  ZYRTEC ALLERGY 10 MG TABS Take 1 tablet by mouth once a day   Last Refilled: 06/27/2009  Medication #2:  ZOCOR 80 MG  TABS Take 1 tablet at bedtime   Last Refilled: 06/27/2009 Last appt. 05/13/09.   Method Requested: Electronic Initial call taken by: Chinita Pester RN,  July 26, 2009 9:25 AM  Follow-up for Phone Call        Rx faxed to pharmacy Follow-up by: Mariea Stable MD,  July 26, 2009 12:19 PM    Prescriptions: ZYRTEC ALLERGY 10 MG TABS (CETIRIZINE HCL) Take 1 tablet by mouth once a day  #30 x 5   Entered and Authorized by:   Mariea Stable MD   Signed by:   Mariea Stable MD on 07/26/2009   Method used:   Electronically to        [facility] [phone]* (retail)       [address]       [place], [place]  [address]       Ph: [phone] or [phone]       Fax: [phone]   RxID:   [id number] ZOCOR 80 MG  TABS (SIMVASTATIN) Take 1 tablet at bedtime  #31 x 6   Entered and Authorized by:   Mariea Stable MD   Signed by:   Mariea Stable MD on 07/26/2009   Method used:   Electronically to        [facility] [phone]* (retail)       [address]       [place], [place]  [address]       Ph: [phone] or [phone]       Fax: [phone]   RxID:   [id number]

## 2010-03-23 NOTE — Letter (Signed)
Summary: [facility]  [facility]   Imported By: Margie Billet 05/17/2009 10:32:18  _____________________________________________________________________  External Attachment:    Type:   Image     Comment:   External Document  Appended Document: [facility]    Clinical Lists Changes  Observations: Added new observation of PAP SMEAR: Interpretation/Result:Negative for intraepithelial Lesion or Malignancy.    (04/12/2008 17:37)       Pap Smear  Procedure date:  04/12/2008  Findings:      Interpretation/Result:Negative for intraepithelial Lesion or Malignancy.      Pap Smear  Procedure date:  04/12/2008  Findings:      Interpretation/Result:Negative for intraepithelial Lesion or Malignancy.

## 2010-03-23 NOTE — Progress Notes (Signed)
Summary: refill/gg  Phone Note Refill Request  on May 30, 2009 3:39 PM  Refills Requested: Medication #1:  LEXAPRO 20 MG TABS Take 1 tablet by mouth once a day   Last Refilled: 04/26/2009  Method Requested: Electronic Initial call taken by: Merrie Roof RN,  May 30, 2009 3:39 PM  Follow-up for Phone Call        Rx faxed to pharmacy Follow-up by: Mariea Stable MD,  May 31, 2009 9:19 AM    Prescriptions: LEXAPRO 20 MG TABS (ESCITALOPRAM OXALATE) Take 1 tablet by mouth once a day  #31 x 6   Entered and Authorized by:   Mariea Stable MD   Signed by:   Mariea Stable MD on 05/31/2009   Method used:   Electronically to        [facility] [phone]* (retail)       [address]       [place], [place]  [address]       Ph: [phone] or [phone]       Fax: [phone]   RxID:   [id number]

## 2010-03-23 NOTE — Miscellaneous (Signed)
Summary: HEALTH INFORMATION RELEASE OF INFORMATION FORM   HEALTH INFORMATION RELEASE OF INFORMATION FORM   Imported By: Margie Billet 05/17/2009 12:02:33  _____________________________________________________________________  External Attachment:    Type:   Image     Comment:   External Document

## 2010-03-23 NOTE — Progress Notes (Signed)
Summary: Refill/gh  Phone Note Refill Request Message from:  Fax from Pharmacy on November 21, 2009 12:35 PM  Refills Requested: Medication #1:  TRAMADOL HCL 50 MG  TABS take 1 tablet every 6 hours as needed for pain.   Last Refilled: 10/14/2009  Method Requested: Electronic Initial call taken by: Angelina Ok RN,  November 21, 2009 12:35 PM  Follow-up for Phone Call        Rx faxed to pharmacy Follow-up by: Mariea Stable MD,  November 22, 2009 8:09 AM    Prescriptions: TRAMADOL HCL 50 MG  TABS (TRAMADOL HCL) take 1 tablet every 6 hours as needed for pain.  #40 Tablet x 0   Entered and Authorized by:   Mariea Stable MD   Signed by:   Mariea Stable MD on 11/22/2009   Method used:   Electronically to        [facility] [phone]* (retail)       [address]       [place], [place]  [address]       Ph: [phone] or [phone]       Fax: [phone]   RxID:   [id number]

## 2010-03-23 NOTE — Progress Notes (Signed)
Summary: med refill/gp  Phone Note Refill Request Message from:  Fax from Pharmacy on April 04, 2009 9:47 AM  Refills Requested: Medication #1:  TRAMADOL HCL 50 MG  TABS take 1 tablet every 6 hours as needed for pain.   Last Refilled: 02/10/2009  Method Requested: Telephone to Pharmacy Initial call taken by: Chinita Pester RN,  April 04, 2009 9:48 AM  Follow-up for Phone Call        Rx faxed to pharmacy Follow-up by: Mariea Stable MD,  April 04, 2009 1:08 PM    Prescriptions: TRAMADOL HCL 50 MG  TABS (TRAMADOL HCL) take 1 tablet every 6 hours as needed for pain.  #40 Tablet x 1   Entered and Authorized by:   Mariea Stable MD   Signed by:   Mariea Stable MD on 04/04/2009   Method used:   Electronically to        [facility] [phone]* (retail)       [address]       [place], [place]  [address]       Ph: [phone] or [phone]       Fax: [phone]   RxID:   [id number]

## 2010-03-28 ENCOUNTER — Other Ambulatory Visit: Payer: Self-pay | Admitting: Internal Medicine

## 2010-03-29 ENCOUNTER — Other Ambulatory Visit: Payer: Self-pay | Admitting: Internal Medicine

## 2010-03-29 MED ORDER — SIMVASTATIN 40 MG PO TABS: 40.0000 mg | ORAL_TABLET | Freq: Every evening | ORAL | Status: DC

## 2010-04-19 ENCOUNTER — Other Ambulatory Visit (HOSPITAL_COMMUNITY): Payer: Self-pay

## 2010-04-19 ENCOUNTER — Ambulatory Visit (HOSPITAL_COMMUNITY): Admission: RE | Admit: 2010-04-19 | Payer: Medicare Other | Source: Ambulatory Visit

## 2010-04-20 ENCOUNTER — Other Ambulatory Visit: Payer: Self-pay | Admitting: Internal Medicine

## 2010-04-21 ENCOUNTER — Other Ambulatory Visit: Payer: Self-pay | Admitting: Obstetrics & Gynecology

## 2010-04-21 DIAGNOSIS — R1900 Intra-abdominal and pelvic swelling, mass and lump, unspecified site: Secondary | ICD-10-CM

## 2010-05-02 ENCOUNTER — Ambulatory Visit (HOSPITAL_COMMUNITY): Admission: RE | Admit: 2010-05-02 | Payer: Medicare Other | Source: Ambulatory Visit

## 2010-05-06 LAB — CBC
MCH: 31.1 pg (ref 26.0–34.0)
MCHC: 33.2 g/dL (ref 30.0–36.0)
Platelets: 299 10*3/uL (ref 150–400)
RBC: 4.07 MIL/uL (ref 3.87–5.11)

## 2010-05-06 LAB — URINALYSIS, ROUTINE W REFLEX MICROSCOPIC
Hgb urine dipstick: NEGATIVE
Nitrite: NEGATIVE
Specific Gravity, Urine: 1.008 (ref 1.005–1.030)
Urobilinogen, UA: 0.2 mg/dL (ref 0.0–1.0)

## 2010-05-06 LAB — BASIC METABOLIC PANEL
Chloride: 103 mEq/L (ref 96–112)
Creatinine, Ser: 0.6 mg/dL (ref 0.4–1.2)
GFR calc Af Amer: >60 mL/min (ref >60)
Glucose, Bld: 91 mg/dL (ref 70–99)
Potassium: 4 mEq/L (ref 3.5–5.1)

## 2010-05-06 LAB — DIFFERENTIAL
Basophils Relative: 3 % — ABNORMAL HIGH (ref 0–1)
Eosinophils Absolute: 0.3 10*3/uL (ref 0.0–0.7)
Lymphs Abs: 2.2 10*3/uL (ref 0.7–4.0)
Neutrophils Relative %: 47 % (ref 43–77)

## 2010-05-06 LAB — POCT CARDIAC MARKERS: Myoglobin, poc: 101 ng/mL (ref 12–200)

## 2010-05-06 LAB — GLUCOSE, CAPILLARY: Glucose-Capillary: 92 mg/dL (ref 70–99)

## 2010-05-09 ENCOUNTER — Other Ambulatory Visit: Payer: Self-pay | Admitting: Obstetrics & Gynecology

## 2010-05-09 DIAGNOSIS — R9389 Abnormal findings on diagnostic imaging of other specified body structures: Secondary | ICD-10-CM

## 2010-05-17 ENCOUNTER — Ambulatory Visit (HOSPITAL_COMMUNITY)
Admission: RE | Admit: 2010-05-17 | Discharge: 2010-05-17 | Disposition: A | Discharge status: Home / Self Care | Payer: Medicare Other | Source: Ambulatory Visit | Attending: Obstetrics & Gynecology | Admitting: Obstetrics & Gynecology

## 2010-05-17 DIAGNOSIS — R9389 Abnormal findings on diagnostic imaging of other specified body structures: Secondary | ICD-10-CM

## 2010-05-17 DIAGNOSIS — N950 Postmenopausal bleeding: Secondary | ICD-10-CM | POA: Insufficient documentation

## 2010-05-17 DIAGNOSIS — N949 Unspecified condition associated with female genital organs and menstrual cycle: Secondary | ICD-10-CM | POA: Insufficient documentation

## 2010-05-26 ENCOUNTER — Other Ambulatory Visit: Payer: Self-pay | Admitting: Internal Medicine

## 2010-06-14 ENCOUNTER — Encounter: Payer: Medicare Other | Admitting: Internal Medicine

## 2010-07-01 ENCOUNTER — Other Ambulatory Visit: Payer: Self-pay | Admitting: Internal Medicine

## 2010-07-04 ENCOUNTER — Other Ambulatory Visit (INDEPENDENT_AMBULATORY_CARE_PROVIDER_SITE_OTHER): Payer: Medicare Other | Admitting: *Deleted

## 2010-07-04 DIAGNOSIS — M25569 Pain in unspecified knee: Secondary | ICD-10-CM

## 2010-07-04 MED ORDER — ESCITALOPRAM OXALATE 20 MG PO TABS: 20.0000 mg | ORAL_TABLET | Freq: Every day | ORAL | Status: DC

## 2010-07-07 ENCOUNTER — Other Ambulatory Visit (HOSPITAL_COMMUNITY): Payer: Medicare Other

## 2010-07-07 NOTE — Assessment & Plan Note (Signed)
[facility]                         GASTROENTEROLOGY OFFICE NOTE   NAME:Ellen [name], Ellen [name]                     MRN:          [id number]  DATE:01/30/2006                            DOB:          [birth year]    REFERRING PHYSICIAN:  Roseanna Rainbow, M.D.   REASON FOR CONSULTATION:  Abdominal pain, change in bowels.   ASSESSMENT:  A 50 year old morbidly obese black woman who has had  problems with peri-menstrual crampy abdominal pain with severe  defecation difficulty, rectal pain.  She is constipated during that  time.  It started about 4 or 5 years ago and seems worse.  Sometimes she  gets so ill she vomits.  She has some mild midline abdominal tenderness  where I think she has a ventral hernia.  This has been shown on previous  CT.   RECOMMENDATIONS AND PLAN:  1. I think a colonoscopy is appropriate to rule out endometriosis of      the bowel or intermittent obstructing lesion.  2. Pending that, an antispasmodic may be useful.  Perhaps further      assessment of this hernia, which is mildly tender, though I doubt      that.  3. Weight loss is advised.  She says she is working on that.   HISTORY:  A 50 year old African-American woman with problems as outlined  above.  She does not describe any bleeding, but her left lower quadrant  becomes quite painful and it is very difficult to defecate, and she  strains and strains.  It is around the time of her period.   PAST MEDICAL HISTORY:  1. Morbid obesity.  2. Hypertension.  3. Dyslipidemia.  4. Osteoarthritis.  5. Dilation and curettage.  6. Umbilical hernia repair.  7. Cholecystectomy.   MEDICATIONS:  1. Hydrochlorothiazide 25 mg daily.  2. Omeprazole 20 mg 2 twice a day.  3. Lipitor 40 mg daily.  4. Tramadol q.6 hours p.r.n.  5. Enalapril 20 mg 2 each day.  6. Lexapro 20 mg daily.   DRUG ALLERGIES:  CODEINE.   Note, she has a clinical diagnosis of gastroesophageal reflux disease  as  well.   FAMILY HISTORY:  Diabetes in her mother and siblings, and grandmother.   SOCIAL HISTORY:  She is single.  She lives with her mother.  She is  disabled.  No alcohol, tobacco, or drugs.   REVIEW OF SYSTEMS:  See medical history form.   PHYSICAL:  Morbidly obese black woman in no acute distress.  Height 5 feet 6 inches, weight 354 pounds, pulse 84, blood pressure  122/78.  EYES:  Anicteric.  ENT:  Normal mouth and oropharynx.  NECK:  Supple.  No thyromegaly.  CHEST:  Clear.  HEART:  S1, S2, no murmur, rub, or gallop.  ABDOMEN:  Obese, mildly tender in the midline just above the umbilicus.  There is a scar from previous umbilical hernia repair.  LYMPHATICS:  No neck or supraclavicular nodes.  SKIN:  Intertriginous areas are hyperpigmented.  EXTREMITIES:  No peripheral edema.  PSYCH:  She is alert and oriented x3.  RECTAL:  Performed in the  presence of female nursing staff, revealing  minimal anal stenosis.  No stool.  Given the size, it is difficult to  get the entire finger inserted.  She was mildly tender, but there was no  suggestion of a fissure.   I appreciate the opportunity to care for this patient.     Iva Boop, MD,FACG  Electronically Signed    CEG/MedQ  DD: 01/30/2006  DT: 01/30/2006  Job #: [id number]   cc:   Roseanna Rainbow, M.D.

## 2010-07-07 NOTE — Group Therapy Note (Signed)
NAME:  Ellen [name], Ellen [name]                        ACCOUNT NO.:  [id number]   MEDICAL RECORD NO.:  [id number]                   PATIENT TYPE:  OUT   LOCATION:  [facility]                           FACILITY:  [facility]   PHYSICIAN:  [name], MD                     DATE OF BIRTH:  Jun 12, 1960   DATE OF SERVICE:  04/22/2003                                    CLINIC NOTE   REASON FOR VISIT:  The patient is a 50 year old nulligravida black female,  morbidly obese, who weighs 381 pounds, with a blood pressure of 148/83 and  is being seen in the medical clinic for hypertension and is on Lipitor,  Lexapro, and aspirin.  She is in for her routine examinations.  Breast exam:  There are no supraclavicular nodes noted.  The breasts are very large and  pendulous but no dominant masses noted.  The abdomen is soft, obese, no  organomegaly palpated.  External genitalia is normal, BUS within normal  limits.  The vagina is clean and well rugated.  Uterus and adnexa could not  be well palpated because of the habitus of the patient.  Pap smear was  taken.  The patient does complain of dysuria, terminal, and clean catch  urine will be obtained for analysis.  Also, we will order a mammogram as the  patient has not had one in several years.                                               [name], MD    PR/MEDQ  D:  04/22/2003  T:  04/22/2003  Job:  [id number]

## 2010-07-07 NOTE — Op Note (Signed)
[facility]  Patient:    Ellen [name], Ellen [name]                     MRN: [id number] Proc. Date: 04/24/99 Adm. Date:  [id number] Attending:  Antionette Char CC:         GYN Outpatient clinic                           Operative Report  PREOPERATIVE DIAGNOSIS:       Rule out endometrial polyps, abnormal uterine bleeding.  POSTOPERATIVE DIAGNOSIS:      Rule out endometrial polyps, abnormal uterine bleeding.  OPERATION:                    D&C, hysteroscopy.  SURGEON:                      Roseanna Rainbow, M.D.  ASSISTANT:  ANESTHESIA:                   Managed anesthesia care, paracervical block.  COMPLICATIONS:                None.  ESTIMATED BLOOD LOSS:         Less than 50 cc.  HYSKON:                       150 cc.  FINDINGS:                     Upon hysteroscopic examination the endometrium appeared polypoid and lush.  DESCRIPTION OF PROCEDURE:     The patient was taken to the operating room.  A sterile speculum was placed in the patients vagina.  2 cc of lidocaine were injected into the anterior lip of the cervix.  The single tooth tenaculum was then applied to this location and 4 cc of lidocaine were injected at 4 and 7 oclock o produce a paracervical block.  The cervix was then dilated with Shawnie Pons dilators nd the hysteroscope was then advanced into the cervical canal and subsequently to he uterine fundus.  As the endometrium appeared very lush and polypoid as described above, it was difficult to visualize the ostium.  A sharp curettage was then performed until a gritty texture was noted.  The graspers were then placed through the operative port to grasp the polypoid tissue.  The scope was then reintroduced into the uterus after sharp curettage was performed.  All the instruments were hen removed from the vagina.  There was minimal bleeding noted and the tenaculum removed with good hemostasis noted.  The patient tolerated the  procedure well. The patient was taken to the PACU in stable condition.  Pathology was endometrial curettings. DD:  04/24/99 TD:  04/25/99 Job: [id number]

## 2010-07-11 ENCOUNTER — Other Ambulatory Visit: Payer: Self-pay | Admitting: Obstetrics & Gynecology

## 2010-07-11 ENCOUNTER — Encounter (HOSPITAL_COMMUNITY): Payer: Medicare Other

## 2010-07-11 LAB — CBC
HCT: 38.1 % (ref 36.0–46.0)
Hemoglobin: 12.4 g/dL (ref 12.0–15.0)
MCH: 30.3 pg (ref 26.0–34.0)
MCHC: 32.5 g/dL (ref 30.0–36.0)
MCV: 93.2 fL (ref 78.0–100.0)

## 2010-07-11 LAB — BASIC METABOLIC PANEL
CO2: 27 mEq/L (ref 19–32)
Calcium: 10.5 mg/dL (ref 8.4–10.5)
Creatinine, Ser: 0.52 mg/dL (ref 0.4–1.2)
Glucose, Bld: 99 mg/dL (ref 70–99)

## 2010-07-14 ENCOUNTER — Other Ambulatory Visit: Payer: Self-pay | Admitting: Obstetrics & Gynecology

## 2010-07-14 ENCOUNTER — Ambulatory Visit (HOSPITAL_COMMUNITY)
Admission: RE | Admit: 2010-07-14 | Discharge: 2010-07-14 | Disposition: A | Discharge status: Home / Self Care | Payer: Medicare Other | Attending: Obstetrics & Gynecology | Admitting: Obstetrics & Gynecology

## 2010-07-14 DIAGNOSIS — N840 Polyp of corpus uteri: Secondary | ICD-10-CM | POA: Insufficient documentation

## 2010-07-14 DIAGNOSIS — R9389 Abnormal findings on diagnostic imaging of other specified body structures: Secondary | ICD-10-CM | POA: Insufficient documentation

## 2010-07-14 DIAGNOSIS — N9089 Other specified noninflammatory disorders of vulva and perineum: Secondary | ICD-10-CM | POA: Insufficient documentation

## 2010-07-14 DIAGNOSIS — Z01812 Encounter for preprocedural laboratory examination: Secondary | ICD-10-CM | POA: Insufficient documentation

## 2010-07-14 DIAGNOSIS — Z01818 Encounter for other preprocedural examination: Secondary | ICD-10-CM | POA: Insufficient documentation

## 2010-07-19 NOTE — Op Note (Signed)
[name], [name]              ACCOUNT NO.:  [id number]  MEDICAL RECORD NO.:  [id number]           PATIENT TYPE:  O  LOCATION:  [facility]                          FACILITY:  [facility]  PHYSICIAN:  Roseanna Rainbow, M.D.DATE OF BIRTH:  08-11-1960  DATE OF PROCEDURE:  07/14/2010 DATE OF DISCHARGE:                              OPERATIVE REPORT   PREOPERATIVE DIAGNOSES:  Thickened endometrium, peau d'orange, mons pubis skin area.  POSTOPERATIVE DIAGNOSIS:  Thickened endometrium, peau d'orange, mons pubis skin area, endometrial polyp, uterine synechiae.  PROCEDURE:  Diagnostic dilatation and curettage, hysteroscopy, polypectomy, biopsy of mons pubis.  SURGEON:  Roseanna Rainbow, MD.  ANESTHESIA:  Laryngeal mask airway, paracervical block, local.  FINDINGS:  In the midline, there is an area of peau d'orange appearing skin, thickened in the mons pubis.  At hysteroscopy, the tubal ostia could not be seen as there were diffuse synechiae in the cornu as well as the fundus in the midline.  As there was a fairly dense looking scar in the midline in the central region of the fundus.  There is a polypoid structure emanating from the left uterine sidewall on the fundus.  This was 1 cm in diameter.  There were no other discrete lesions noted.  PATHOLOGY:  Endometrial curettings and a biopsy of the skin of the mons pubis.  All specimens to pathology.  Endometrial polyp.  ESTIMATED BLOOD LOSS:  Minimal.  COMPLICATIONS:  None.  PROCEDURE:  The patient was taken to the operating room with an IV running.  A laryngeal mask airway was placed.  She was then placed in the semi-lithotomy position in [place] stirrups and prepped and draped in the usual sterile fashion.  After a time-out had been completed, a speculum was placed.  The anterior lip of the cervix was then infiltrated with 2 mL of 1% lidocaine plain.  The single-tooth tenaculum was then applied to this location.  4 mL of 1%  lidocaine plain were then injected at 4 and 7 o'clock to produce a paracervical block.  The cervix was then dilated with [facility] dilators.  The hysteroscope was then introduced into the uterine cavity with the above findings noted.  A sharp curettage was then performed.  Using a polyp forceps, the above- noted polyp was removed.  The uterus sounded to 10 cm.  A gritty texture was noted during the curettage.  The single-tooth tenaculum was then removed with minimal bleeding noted from the cervix.  There was minimal deficit for the glycine used as a distending medium.  A patch of skin on the mons pubis was then infiltrated with 5 mL of 1% lidocaine plain and approximately 1 cm x 0.5 cm segment of skin was then excised including some of the subcutaneous tissue with a 15 blade scalpel.  The area was oversewn with horizontal mattress sutures. Adequate hemostasis was noted.  At the close of the procedure, the instrument and pack counts were said to be correct x2.  The patient was taken to the PACU awake and in stable condition.     Roseanna Rainbow, M.D.     LAJ/MEDQ  D:  07/14/2010  T:  07/15/2010  Job:  [id number]  Electronically Signed by Antionette Char M.D. on 07/19/2010 09:41:41 PM

## 2010-07-21 ENCOUNTER — Encounter: Payer: Self-pay | Admitting: Internal Medicine

## 2010-07-27 ENCOUNTER — Encounter: Payer: Self-pay | Admitting: Internal Medicine

## 2010-08-16 ENCOUNTER — Other Ambulatory Visit (INDEPENDENT_AMBULATORY_CARE_PROVIDER_SITE_OTHER): Payer: Self-pay | Admitting: Surgery

## 2010-08-16 ENCOUNTER — Encounter (HOSPITAL_COMMUNITY): Payer: Medicare Other

## 2010-08-16 ENCOUNTER — Ambulatory Visit (HOSPITAL_COMMUNITY)
Admission: RE | Admit: 2010-08-16 | Discharge: 2010-08-16 | Disposition: A | Discharge status: Home / Self Care | Payer: Medicare Other | Source: Ambulatory Visit | Attending: Surgery | Admitting: Surgery

## 2010-08-16 DIAGNOSIS — Z01811 Encounter for preprocedural respiratory examination: Secondary | ICD-10-CM

## 2010-08-16 DIAGNOSIS — M79609 Pain in unspecified limb: Secondary | ICD-10-CM | POA: Insufficient documentation

## 2010-08-16 DIAGNOSIS — Z01818 Encounter for other preprocedural examination: Secondary | ICD-10-CM | POA: Insufficient documentation

## 2010-08-16 DIAGNOSIS — Z7982 Long term (current) use of aspirin: Secondary | ICD-10-CM | POA: Insufficient documentation

## 2010-08-16 DIAGNOSIS — R209 Unspecified disturbances of skin sensation: Secondary | ICD-10-CM | POA: Insufficient documentation

## 2010-08-16 DIAGNOSIS — K430 Incisional hernia with obstruction, without gangrene: Secondary | ICD-10-CM | POA: Insufficient documentation

## 2010-08-16 DIAGNOSIS — I10 Essential (primary) hypertension: Secondary | ICD-10-CM | POA: Insufficient documentation

## 2010-08-16 DIAGNOSIS — Z79899 Other long term (current) drug therapy: Secondary | ICD-10-CM | POA: Insufficient documentation

## 2010-08-16 DIAGNOSIS — Z01812 Encounter for preprocedural laboratory examination: Secondary | ICD-10-CM | POA: Insufficient documentation

## 2010-08-16 LAB — BASIC METABOLIC PANEL
BUN: 14 mg/dL (ref 6–23)
Chloride: 102 mEq/L (ref 96–112)
Creatinine, Ser: 0.68 mg/dL (ref 0.50–1.10)
GFR calc Af Amer: >60 mL/min (ref >60)

## 2010-08-16 LAB — CBC
Hemoglobin: 12.5 g/dL (ref 12.0–15.0)
MCH: 30.3 pg (ref 26.0–34.0)
MCHC: 32.8 g/dL (ref 30.0–36.0)
Platelets: 303 10*3/uL (ref 150–400)

## 2010-08-16 LAB — SURGICAL PCR SCREEN: MRSA, PCR: NEGATIVE

## 2010-08-24 ENCOUNTER — Other Ambulatory Visit (INDEPENDENT_AMBULATORY_CARE_PROVIDER_SITE_OTHER): Payer: Self-pay | Admitting: Surgery

## 2010-08-24 ENCOUNTER — Ambulatory Visit (HOSPITAL_COMMUNITY)
Admission: RE | Admit: 2010-08-24 | Discharge: 2010-08-27 | Disposition: A | Discharge status: Home / Self Care | Payer: Medicare Other | Source: Ambulatory Visit | Attending: Surgery | Admitting: Surgery

## 2010-08-24 DIAGNOSIS — D280 Benign neoplasm of vulva: Secondary | ICD-10-CM

## 2010-08-24 DIAGNOSIS — K430 Incisional hernia with obstruction, without gangrene: Secondary | ICD-10-CM | POA: Insufficient documentation

## 2010-08-24 DIAGNOSIS — K565 Intestinal adhesions [bands], unspecified as to partial versus complete obstruction: Secondary | ICD-10-CM

## 2010-08-24 DIAGNOSIS — N9489 Other specified conditions associated with female genital organs and menstrual cycle: Secondary | ICD-10-CM | POA: Insufficient documentation

## 2010-08-26 LAB — CREATININE, SERUM: GFR calc Af Amer: >60 mL/min (ref >60)

## 2010-08-27 LAB — BASIC METABOLIC PANEL
BUN: 9 mg/dL (ref 6–23)
Creatinine, Ser: 0.54 mg/dL (ref 0.50–1.10)
GFR calc Af Amer: >60 mL/min (ref >60)
GFR calc non Af Amer: >60 mL/min (ref >60)
Potassium: 4.1 mEq/L (ref 3.5–5.1)

## 2010-08-27 LAB — CBC
HCT: 35.1 % — ABNORMAL LOW (ref 36.0–46.0)
MCHC: 32.2 g/dL (ref 30.0–36.0)
MCV: 94.1 fL (ref 78.0–100.0)
Platelets: 276 10*3/uL (ref 150–400)
RDW: 11.9 % (ref 11.5–15.5)

## 2010-08-29 ENCOUNTER — Other Ambulatory Visit (INDEPENDENT_AMBULATORY_CARE_PROVIDER_SITE_OTHER): Payer: Self-pay | Admitting: General Surgery

## 2010-08-29 DIAGNOSIS — R112 Nausea with vomiting, unspecified: Secondary | ICD-10-CM

## 2010-08-29 MED ORDER — PROMETHAZINE HCL 25 MG PO TABS: 25.0000 mg | ORAL_TABLET | Freq: Four times a day (QID) | ORAL | Status: DC | PRN

## 2010-09-05 NOTE — Discharge Summary (Signed)
  [name], [name]              ACCOUNT NO.:  [id number]  MEDICAL RECORD NO.:  [id number]  LOCATION:  1534                         FACILITY:  [facility]  PHYSICIAN:  Ardeth Sportsman, MD     DATE OF BIRTH:  1960/07/06  DATE OF ADMISSION:  08/24/2010 DATE OF DISCHARGE:  08/27/2010                              DISCHARGE SUMMARY   PRIMARY CARE PHYSICIAN:  [facility].  GYNECOLOGIST:  Roseanna Rainbow, MD  DIAGNOSIS:  Supraumbilical recurrent incarcerated ventral hernia.  PROCEDURE PERFORMED:  Laparoscopic lysis of adhesions and ventral hernia repair with mesh as well as excision of subcutaneous mass on mons pubis.  OTHER DIAGNOSES: 1. Morbid obesity. 2. Hypercholesterolemia. 3. Hypertension.  SUMMARY OF HOSPITAL COURSE:  [name] is a 50 year old morbidly obese female who developed recurrent incisional hernia.  She underwent repair. Postoperatively, she had pain and soreness that required IV narcotics. She had an On-Q continuous bupivacaine pump as well.  By the time of discharge, she was walking better in the hallways, she was tolerating oral intake, she is having her return of bowel function. Her pain was better controlled with pills.  We, therefore, thought it was reasonable to discharge home with the following instructions, 1. She is to return to clinic to see me in about 2-3 weeks. 2. She should resume her home medications as noted on the medical     discharge sheet. 3. She should call if she has worsening fever, chills, sweats, nausea,     vomiting, uncontrolled pain, diarrhea, or other concerns.     Ardeth Sportsman, MD     SCG/MEDQ  D:  08/28/2010  T:  08/28/2010  Job:  [id number]  Electronically Signed by Karie Soda MD on 09/05/2010 12:46:54 PM

## 2010-09-05 NOTE — Op Note (Signed)
Ellen [name], Ellen [name]              ACCOUNT NO.:  [id number]  MEDICAL RECORD NO.:  [id number]  LOCATION:  1534                         FACILITY:  [facility]  PHYSICIAN:  Ardeth Sportsman, MD     DATE OF BIRTH:  1960-10-03  DATE OF PROCEDURE:  08/24/2010 DATE OF DISCHARGE:                              OPERATIVE REPORT   PRIMARY CARE PHYSICIAN:  [facility].  REFERRING PHYSICIAN:  Roseanna Rainbow, MD, OB-GYN.  SURGEON:  Ardeth Sportsman, MD  ASSISTANT:  RN.  PREOPERATIVE DIAGNOSIS:  Supraumbilical recurrent incarcerated ventral wall incisional hernia.  POSTOPERATIVE DIAGNOSES: 1. Supraumbilical recurrent incarcerated ventral wall incisional     hernia. 2. Subcutaneous mass on mons pubis, question of hidradenitis versus     sebaceous cyst.  PROCEDURES PERFORMED: 1. Laparoscopic reduction and lysis of adhesions of incarcerated     ventral wall hernia. 2. Laparoscopic ventral wall hernia repair with mesh (20 x 30 cm     Parietex/Seprafilm) mesh. 3. Excision of subcutaneous mass of mons pubis with primary closure.  SPECIMENS:  Subcutaneous mass of mons pubis.  DRAINS:  None.  ESTIMATED BLOOD LOSS:  Minimal.  COMPLICATIONS:  None apparent.  INDICATIONS:  Ellen [name] is a 50 year old super morbidly obese female who has had open cholecystectomy and prior surgeries in the past.  She developed a painful mass supraumbilically.  Dr. Tamela Oddi was concerned about hernia and sent the patient to me 2 years ago.  I recommended consideration of surgery.  She had a lot of family issues that delayed her treatment, however, she had worsening symptoms and came back wishing to have it repaired.  Pathophysiology of herniation with its natural history and risks were discussed.  Options discussed.  Recommendation was made for laparoscopic reduction and repair.  Risks, benefits, alternatives discussed. Questions answered.  She agreed to proceed.  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  Her prior open cholecystectomy and fascial closure seemed intact.  She had some subcu nodularity on her mons pubis most noticeably in the midline with some mildly draining cysts concerning for some hidradenitis versus some sebaceous cyst.  I decided to excise given the inflammation around it to avoid mesh infection.  DESCRIPTION OF PROCEDURE:  Informed consent was confirmed.  The patient received IV antibiotics.  She underwent general anesthesia without any difficulty.  She was supine with her left arm tucked.  She had a Foley catheter sterilely placed.  Her abdomen and mons pubis were clipped, prepped and draped in sterile fashion.  Surgical timeout confirmed our plan.  I placed a 5 mm port in left upper quadrant using optical entry technique with the patient in steep reverse Trendelenburg and left side up.  Entry was clean.  Camera inspection revealed moderate swath of omentum and fat going up into a periumbilical and supraumbilical region of her abdomen consistent with incarcerated ventral hernia.  I placed a 2 mm port in the left lateral flank and a 5 mm port in left lower quadrant.  I began dissection and found the transverse colon and noted it was not incarcerated into the midline.  There were no adhesions  to the midline.  It was up close to it, but not complete.  I went ahead and gradually reduced a large swath of incarcerated greater omentum out of the Swiss cheese hernia using focused cautery to help free off the hernia sac and reduce it down.  There was some greater omentum noted within the hernia sac and I morcellated those and removed those.  She did have some adhesions in her right upper quadrant and I freed those down to make sure she did not have any hernia up along her prior cholecystectomy and there was not.  I matched out a region of the defects, which went from her umbilicus about 10 cm cephalad  and was about 5 cm in breath consistent with numerous Swiss cheese ventral hernias.  I chose a Parietex/Seprafilm dual-side mesh.  I chose a 20 x 30 as the only other side available was 10 x 15 and decided to incise a larger piece of mesh to help patch this up.  I secured it to the anterior abdominal wall using #1 Prolene interrupted stitches.  I had 18 stitches around the periphery and 4 centrally in 4 corners around the mesh to hold it in place.  I had good overlap of over 10 cm.  I evacuated carbon dioxide and tied the fascial stitch down and resufflated.  I used a tacker to help tack the mesh in place as well.  I had excellent overlap.  I placed a 0 Vicryl stitch around the 10 mm port site to help close that down as well.  I evacuated camera from perineum and removed the ports.  I closed the port sites and placed Steri-Strips around the puncture sites of the fascial stitch.  On inspection of the mons pubis, she did have an inflamed midline structure on the mons pubis that was 2 x 3 cm in size.  I was concerned there may be some mildly inflamed hidradenitis suppurativa.  I went ahead and excised that using horizontal biconcave ellipsoid excision until I had healthier subcutaneous tissue.  I reapproximated that using 4-0 Monocryl stitch.  Steri-Strips were applied.  Note, I did place an On-Q continuous bupivacaine pain pump in the preperitoneal planes along both subcostal ridge aiming down towards the posterior hips on both sides to makes sure I had good potential nerve blocks to help take the edge off the repair.  I had placed the sheaths in the preperitoneal plane laparoscopically and then placed the catheters through the sheath and peeled those away near the end of the case.  The patient was extubated and taken to the recovery room in stable condition.  I discussed postop care with the patient in detail in my office and prior to surgery and I am about to discuss it  again.     Ardeth Sportsman, MD     SCG/MEDQ  D:  08/24/2010  T:  08/24/2010  Job:  [id number]  cc:   Roseanna Rainbow, M.D. Fax: [phone]  [facility]  Electronically Signed by Karie Soda MD on 09/05/2010 12:46:59 PM

## 2010-09-11 ENCOUNTER — Ambulatory Visit (INDEPENDENT_AMBULATORY_CARE_PROVIDER_SITE_OTHER): Payer: Self-pay | Admitting: Surgery

## 2010-09-11 ENCOUNTER — Encounter (INDEPENDENT_AMBULATORY_CARE_PROVIDER_SITE_OTHER): Payer: Self-pay | Admitting: Surgery

## 2010-09-11 VITALS — BP 126/80 | HR 68 | Temp 96.7°F | Ht 67.0 in | Wt 351.8 lb

## 2010-09-11 DIAGNOSIS — K436 Other and unspecified ventral hernia with obstruction, without gangrene: Secondary | ICD-10-CM

## 2010-09-11 MED ORDER — OXYCODONE HCL 5 MG PO TABS: 5.0000 mg | ORAL_TABLET | Freq: Four times a day (QID) | ORAL | Status: AC | PRN

## 2010-09-11 NOTE — Progress Notes (Signed)
Subjective:     Patient ID: Ellen [name], female   DOB: 09/30/1960, 50 y.o.   MRN: [id number]  HPI  Patient comes in today feeling okay. She's had a lot of soreness. She can sleep only on her back. She just finished her oxyodone pain prescription. However she is feeling better overall. She is up walking more. He wonders if that is okay to start having sex. Urinating fine. No problems with constipation.  Review of Systems  Constitutional: Negative for fever, chills and diaphoresis.  HENT: Negative for ear pain, sore throat and trouble swallowing.   Eyes: Negative for photophobia and visual disturbance.  Respiratory: Negative for cough and choking.   Cardiovascular: Negative for chest pain and palpitations.  Gastrointestinal: Positive for abdominal pain. Negative for nausea, vomiting, diarrhea, constipation, blood in stool, abdominal distention, anal bleeding and rectal pain.  Genitourinary: Negative for dysuria, frequency and difficulty urinating.  Musculoskeletal: Negative for myalgias and gait problem.  Skin: Negative for color change, pallor and rash.  Neurological: Negative for dizziness, speech difficulty, weakness and numbness.  Hematological: Negative for adenopathy.  Psychiatric/Behavioral: Negative for confusion and agitation. The patient is not nervous/anxious.        Objective:   Physical Exam  Constitutional: She is oriented to person, place, and time. She appears well-developed and well-nourished. No distress.  HENT:  Head: Normocephalic.  Mouth/Throat: Oropharynx is clear and moist. No oropharyngeal exudate.  Eyes: Conjunctivae and EOM are normal. Pupils are equal, round, and reactive to light. No scleral icterus.  Neck: Normal range of motion. Neck supple. No tracheal deviation present.  Cardiovascular: Normal rate, regular rhythm and intact distal pulses.   Pulmonary/Chest: Effort normal and breath sounds normal. No respiratory distress. She exhibits no tenderness.    Abdominal: Soft. She exhibits no distension and no mass. There is tenderness. There is no guarding. Hernia confirmed negative in the right inguinal area and confirmed negative in the left inguinal area.       Healed lap incisions & puncture sites.  Mild soreness, esp RUQ.  Genitourinary: No vaginal discharge found.  Musculoskeletal: Normal range of motion. She exhibits no tenderness.  Lymphadenopathy:    She has no cervical adenopathy.       Right: No inguinal adenopathy present.       Left: No inguinal adenopathy present.  Neurological: She is alert and oriented to person, place, and time. No cranial nerve deficit. She exhibits normal muscle tone. Coordination normal.  Skin: Skin is warm and dry. No rash noted. She is not diaphoretic. No erythema.  Psychiatric: She has a normal mood and affect. Her behavior is normal. Judgment and thought content normal.       Assessment:     Status post repair of incarcerated supraumbilical ventral wall herniae. Sore but recovering well less than a month out.  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    Plan:     I renewed oxycodone #40. Continue Tylenol. Continue ice and heat. Activity as tolerated. Do not push through pain.  Return to clinic p.r.n. If she's recovering this well, I don't think I have to see her unless things do not improve or she worsens. She felt comfortable with this plan. She expressed appreciation for our care.

## 2010-09-13 ENCOUNTER — Other Ambulatory Visit: Payer: Self-pay | Admitting: Internal Medicine

## 2010-09-15 ENCOUNTER — Encounter: Payer: Medicare Other | Admitting: Internal Medicine

## 2010-10-14 ENCOUNTER — Other Ambulatory Visit: Payer: Self-pay | Admitting: Internal Medicine

## 2010-10-14 DIAGNOSIS — K219 Gastro-esophageal reflux disease without esophagitis: Secondary | ICD-10-CM

## 2010-10-16 NOTE — Telephone Encounter (Signed)
On omeprazole for GERD.  No showed appointment on 09/15/2010.  Will refill for one month and try to get her into clinic with Dr. Manson Passey to discuss appropriateness of further refills at that dose.

## 2010-11-08 ENCOUNTER — Encounter: Payer: PRIVATE HEALTH INSURANCE | Admitting: Internal Medicine

## 2010-11-15 ENCOUNTER — Other Ambulatory Visit: Payer: Self-pay | Admitting: *Deleted

## 2010-11-15 MED ORDER — TRAMADOL HCL 50 MG PO TABS: 50.0000 mg | ORAL_TABLET | Freq: Four times a day (QID) | ORAL | Status: DC | PRN

## 2010-11-17 LAB — URINE CULTURE
Colony Count: NO GROWTH
Culture: NO GROWTH

## 2010-11-17 LAB — CBC
HCT: 37.9
Hemoglobin: 12.9
MCHC: 34
MCV: 92.4
Platelets: 361
RBC: 4.1
RDW: 11.7
WBC: 7.9

## 2010-11-17 LAB — DIFFERENTIAL
Basophils Absolute: 0.1
Basophils Relative: 1
Eosinophils Absolute: 0.3
Eosinophils Relative: 4
Lymphocytes Relative: 37
Lymphs Abs: 2.9
Monocytes Absolute: 0.7
Monocytes Relative: 9
Neutro Abs: 3.9
Neutrophils Relative %: 50

## 2010-11-17 LAB — URINALYSIS, ROUTINE W REFLEX MICROSCOPIC
Bilirubin Urine: NEGATIVE
Glucose, UA: NEGATIVE
Ketones, ur: NEGATIVE
Nitrite: NEGATIVE
Protein, ur: NEGATIVE
Specific Gravity, Urine: 1.03
Urobilinogen, UA: 1
pH: 5.5

## 2010-11-17 LAB — BASIC METABOLIC PANEL WITH GFR
CO2: 24
Chloride: 99
GFR calc Af Amer: >60
Glucose, Bld: 81
Potassium: 4.5
Sodium: 138

## 2010-11-17 LAB — BASIC METABOLIC PANEL
BUN: 9
Calcium: 10.1
Creatinine, Ser: 0.6
GFR calc non Af Amer: >60

## 2010-11-17 LAB — URINE MICROSCOPIC-ADD ON

## 2010-11-17 LAB — PREGNANCY, URINE: Preg Test, Ur: NEGATIVE

## 2010-11-17 LAB — CK: Total CK: 88

## 2010-11-20 LAB — POCT PREGNANCY, URINE: Preg Test, Ur: NEGATIVE

## 2010-11-20 LAB — POCT URINALYSIS DIP (DEVICE)
Bilirubin Urine: NEGATIVE
Nitrite: NEGATIVE
Protein, ur: NEGATIVE
pH: 6.5

## 2010-11-20 LAB — HERPES SIMPLEX VIRUS CULTURE: Culture: NOT DETECTED

## 2010-11-21 LAB — RAPID STREP SCREEN (MED CTR MEBANE ONLY): Streptococcus, Group A Screen (Direct): POSITIVE — AB

## 2010-11-22 LAB — RAPID STREP SCREEN (MED CTR MEBANE ONLY): Streptococcus, Group A Screen (Direct): NEGATIVE

## 2010-11-24 LAB — URINALYSIS, ROUTINE W REFLEX MICROSCOPIC
Nitrite: NEGATIVE
Specific Gravity, Urine: 1.016 (ref 1.005–1.030)
Urobilinogen, UA: 0.2 mg/dL (ref 0.0–1.0)

## 2010-11-24 LAB — URINE MICROSCOPIC-ADD ON

## 2010-12-13 ENCOUNTER — Encounter: Payer: PRIVATE HEALTH INSURANCE | Admitting: Internal Medicine

## 2011-01-01 ENCOUNTER — Telehealth: Payer: Self-pay | Admitting: *Deleted

## 2011-01-01 NOTE — Telephone Encounter (Signed)
Pt is requesting a refill on Lexapro 20 mg tablets # 90.  Take 1 tablet daily.  Last refill was 12/07/2010.   Angelina Ok, RN 01/01/2011 4:13 PM.

## 2011-01-03 ENCOUNTER — Other Ambulatory Visit: Payer: Self-pay | Admitting: Internal Medicine

## 2011-01-03 ENCOUNTER — Telehealth: Payer: Self-pay | Admitting: *Deleted

## 2011-01-03 MED ORDER — ESCITALOPRAM OXALATE 20 MG PO TABS: 20.0000 mg | ORAL_TABLET | Freq: Every day | ORAL | Status: DC

## 2011-01-03 NOTE — Telephone Encounter (Signed)
Request refill per pharmacy on:  Escitalopram 20mg  - Take 1 tab daily  Qty 90 Last refilled 12/07/10.

## 2011-01-05 ENCOUNTER — Telehealth: Payer: Self-pay | Admitting: *Deleted

## 2011-01-05 NOTE — Telephone Encounter (Signed)
Pt states she has changed practices; her PCP is Dr. Ward Chatters in [facility]. Also [facility] in [facility] was called to canceled refill for Lexapro per Dr. Rogelia Boga.

## 2011-01-05 NOTE — Telephone Encounter (Signed)
Dr Manson Passey refilled on the 14th for 9 month supply. Problem is that the pt has not been seen since 11/11 so I am sending to front desk to make pt an appt.

## 2011-01-05 NOTE — Telephone Encounter (Signed)
Message copied by Hassan Buckler on Fri Jan 05, 2011  1:53 PM ------      Message from: Lianne Bushy      Created: Fri Jan 05, 2011  1:49 PM       HERE YOU GO      ----- Message -----         From: Blanch Media         Sent: 01/05/2011  12:16 PM           To: Earleen Reaper, RN            [place] and or [place],      Please cancel the refills that Dr Manson Passey provided and let pt know that since she has not been seen in one year she herself will need to take responsibility of making an appt to get additional refills. There is no reason for the front desk to make an appt that she has a hx of not keeping. Thanks!      ----- Message -----         From: Lianne Bushy         Sent: 01/05/2011  10:43 AM           To: Blanch Media            Just wanted to let you know that patient has no show last three appts with Dr. Manson Passey.  He has no appts available for Dec or Jan.  Would you like the patient to come in and see the [facility] doctor of the month.  Please just let me know.            Thanks,            [employer]

## 2011-02-09 ENCOUNTER — Other Ambulatory Visit: Payer: Self-pay | Admitting: *Deleted

## 2011-02-09 NOTE — Telephone Encounter (Signed)
Pt no longer comes to the Clinics.  Message was faxed to the requesting pharmacy.  Angelina Ok, RN 02/09/2011 2:51 PM.

## 2011-03-08 ENCOUNTER — Other Ambulatory Visit: Payer: Self-pay | Admitting: Internal Medicine

## 2011-03-19 ENCOUNTER — Emergency Department (INDEPENDENT_AMBULATORY_CARE_PROVIDER_SITE_OTHER): Payer: Medicare Other

## 2011-03-19 ENCOUNTER — Emergency Department (HOSPITAL_BASED_OUTPATIENT_CLINIC_OR_DEPARTMENT_OTHER)
Admission: EM | Admit: 2011-03-19 | Discharge: 2011-03-19 | Disposition: A | Discharge status: Home / Self Care | Payer: Medicare Other | Attending: Emergency Medicine | Admitting: Emergency Medicine

## 2011-03-19 ENCOUNTER — Encounter (HOSPITAL_BASED_OUTPATIENT_CLINIC_OR_DEPARTMENT_OTHER): Payer: Self-pay | Admitting: *Deleted

## 2011-03-19 DIAGNOSIS — R0602 Shortness of breath: Secondary | ICD-10-CM | POA: Insufficient documentation

## 2011-03-19 DIAGNOSIS — K219 Gastro-esophageal reflux disease without esophagitis: Secondary | ICD-10-CM | POA: Insufficient documentation

## 2011-03-19 DIAGNOSIS — F329 Major depressive disorder, single episode, unspecified: Secondary | ICD-10-CM | POA: Insufficient documentation

## 2011-03-19 DIAGNOSIS — F3289 Other specified depressive episodes: Secondary | ICD-10-CM | POA: Insufficient documentation

## 2011-03-19 DIAGNOSIS — R05 Cough: Secondary | ICD-10-CM

## 2011-03-19 DIAGNOSIS — E669 Obesity, unspecified: Secondary | ICD-10-CM | POA: Insufficient documentation

## 2011-03-19 DIAGNOSIS — I10 Essential (primary) hypertension: Secondary | ICD-10-CM | POA: Insufficient documentation

## 2011-03-19 DIAGNOSIS — Z79899 Other long term (current) drug therapy: Secondary | ICD-10-CM | POA: Insufficient documentation

## 2011-03-19 DIAGNOSIS — R059 Cough, unspecified: Secondary | ICD-10-CM

## 2011-03-19 DIAGNOSIS — J029 Acute pharyngitis, unspecified: Secondary | ICD-10-CM

## 2011-03-19 DIAGNOSIS — J40 Bronchitis, not specified as acute or chronic: Secondary | ICD-10-CM | POA: Insufficient documentation

## 2011-03-19 DIAGNOSIS — R0989 Other specified symptoms and signs involving the circulatory and respiratory systems: Secondary | ICD-10-CM

## 2011-03-19 NOTE — ED Provider Notes (Signed)
History     CSN: [id number]  Arrival date & time 03/19/11  1840   First MD Initiated Contact with Patient 03/19/11 2005      Chief Complaint  Patient presents with  . Sore Throat  . Shortness of Breath    (Consider location/radiation/quality/duration/timing/severity/associated sxs/prior treatment) Patient is a 51 y.o. female presenting with cough. The history is provided by the patient. No language interpreter was used.  Cough This is a new problem. The current episode started more than 1 week ago. The problem occurs constantly. The problem has been gradually worsening. The cough is productive of sputum. There has been no fever. The fever has been present for less than 1 day. Associated symptoms include ear congestion, headaches, rhinorrhea, sore throat and shortness of breath. Pertinent negatives include no chest pain. She has tried decongestants for the symptoms. The treatment provided no relief. She is not a smoker. Her past medical history does not include bronchitis or pneumonia.  Pt complains of couging and congestion.    Past Medical History  Diagnosis Date  . Depression   . GERD (gastroesophageal reflux disease)   . Hypertension   . Lipoma   . Degenerative joint disease   . Bronchitis     hx of  . Obesity   . Hernia   . ENDOMETRIAL POLYP 01/08/2006    Dr. Roseanna Rainbow.  2001:  D&C, benign;  07/14/10:  D&C, hysteroscopy, polypectomy and bx of mons pubis (peau d'orange)...... 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    Past Surgical History  Procedure Date  . Cholecystectomy   . Resection of lipoma   . Umbilical hernia repair     History reviewed. No pertinent family history.  History  Substance Use Topics  . Smoking status: Never Smoker   . Smokeless tobacco: Not on file  . Alcohol Use: No    OB History    Grav Para Term Preterm Abortions TAB SAB Ect Mult Living                  Review of Systems  HENT: Positive for sore  throat and rhinorrhea.   Respiratory: Positive for cough and shortness of breath.   Cardiovascular: Negative for chest pain.  Neurological: Positive for headaches.  All other systems reviewed and are negative.    Allergies  Codeine  Home Medications   Current Outpatient Rx  Name Route Sig Dispense Refill  . ASPIRIN 81 MG PO CHEW Oral Chew 81 mg by mouth daily.      Marland Kitchen CETIRIZINE HCL 10 MG PO TABS  TAKE 1 TABLET BY MOUTH ONCE A DAY 90 tablet 2  . ENALAPRIL MALEATE 20 MG PO TABS Oral Take 20 mg by mouth daily. Take tablets by mouth once a day     . ESCITALOPRAM OXALATE 20 MG PO TABS Oral Take 1 tablet (20 mg total) by mouth daily. 90 tablet 2  . FLUTICASONE PROPIONATE 50 MCG/ACT NA SUSP Nasal Place 2 sprays into the nose daily. Apply 2 sprays in each nostril at bedtime     . HYDROCHLOROTHIAZIDE 25 MG PO TABS Oral Take 25 mg by mouth daily.      Marland Kitchen OMEPRAZOLE 20 MG PO CPDR Oral Take 1 capsule (20 mg total) by mouth 2 (two) times daily. 60 capsule 1  . OXYCODONE HCL 5 MG PO TABS Oral Take 1 tablet (5 mg total) by mouth every 6 (six) hours as needed for pain. 40 tablet 0  . SIMVASTATIN  40 MG PO TABS Oral Take 1 tablet (40 mg total) by mouth every evening. 30 tablet 11  . TRAMADOL HCL 50 MG PO TABS Oral Take 1 tablet (50 mg total) by mouth every 6 (six) hours as needed for pain. 40 tablet 1    BP 136/69  Pulse 92  Temp(Src) 99.4 F (37.4 C) (Oral)  Resp 22  Wt 344 lb (156.037 kg)  SpO2 98%  Physical Exam  Nursing note and vitals reviewed. Constitutional: She appears well-developed and well-nourished.  HENT:  Head: Normocephalic and atraumatic.  Right Ear: External ear normal.  Left Ear: External ear normal.  Nose: Nose normal.  Mouth/Throat: Oropharynx is clear and moist.  Eyes: Conjunctivae and EOM are normal. Pupils are equal, round, and reactive to light.  Neck: Normal range of motion. Neck supple.  Cardiovascular: Normal rate and normal heart sounds.   Pulmonary/Chest:  Effort normal.  Musculoskeletal: Normal range of motion.  Neurological: She is alert.  Skin: Skin is warm.  Psychiatric: She has a normal mood and affect.    ED Course  Procedures (including critical care time)   Labs Reviewed  RAPID STREP SCREEN   Dg Chest 2 View  03/19/2011  *RADIOLOGY REPORT*  Clinical Data: Cough, congestion, sore throat  CHEST - 2 VIEW  Comparison: 08/15/2010  Findings: Normal heart size, mediastinal contours, and pulmonary vascularity. Lungs clear. Mild elongation of aorta noted. No pleural effusion or pneumothorax. Bones unremarkable.  IMPRESSION: No acute abnormalities.  Original Report Authenticated By: Lollie Marrow, M.D.     No diagnosis found.    MDM  Chest xray no pneumonia.  Pt given rx for zithromax.  I advised see primary MD for recheck        Langston Masker, [place] 03/19/11 2156

## 2011-03-19 NOTE — ED Provider Notes (Signed)
Medical screening examination/treatment/procedure(s) were performed by non-physician practitioner and as supervising physician I was immediately available for consultation/collaboration.   Rolan Bucco, MD 03/19/11 [phone]

## 2011-05-11 ENCOUNTER — Emergency Department (HOSPITAL_BASED_OUTPATIENT_CLINIC_OR_DEPARTMENT_OTHER)
Admission: EM | Admit: 2011-05-11 | Discharge: 2011-05-11 | Disposition: A | Discharge status: Home / Self Care | Payer: Medicare Other | Attending: Emergency Medicine | Admitting: Emergency Medicine

## 2011-05-11 ENCOUNTER — Emergency Department (INDEPENDENT_AMBULATORY_CARE_PROVIDER_SITE_OTHER): Payer: Medicare Other

## 2011-05-11 ENCOUNTER — Encounter (HOSPITAL_BASED_OUTPATIENT_CLINIC_OR_DEPARTMENT_OTHER): Payer: Self-pay

## 2011-05-11 DIAGNOSIS — H53149 Visual discomfort, unspecified: Secondary | ICD-10-CM | POA: Insufficient documentation

## 2011-05-11 DIAGNOSIS — Z7982 Long term (current) use of aspirin: Secondary | ICD-10-CM | POA: Insufficient documentation

## 2011-05-11 DIAGNOSIS — I10 Essential (primary) hypertension: Secondary | ICD-10-CM | POA: Insufficient documentation

## 2011-05-11 DIAGNOSIS — F329 Major depressive disorder, single episode, unspecified: Secondary | ICD-10-CM | POA: Insufficient documentation

## 2011-05-11 DIAGNOSIS — R1110 Vomiting, unspecified: Secondary | ICD-10-CM | POA: Insufficient documentation

## 2011-05-11 DIAGNOSIS — R51 Headache: Secondary | ICD-10-CM

## 2011-05-11 DIAGNOSIS — H539 Unspecified visual disturbance: Secondary | ICD-10-CM

## 2011-05-11 DIAGNOSIS — F3289 Other specified depressive episodes: Secondary | ICD-10-CM | POA: Insufficient documentation

## 2011-05-11 DIAGNOSIS — K219 Gastro-esophageal reflux disease without esophagitis: Secondary | ICD-10-CM | POA: Insufficient documentation

## 2011-05-11 DIAGNOSIS — Z79899 Other long term (current) drug therapy: Secondary | ICD-10-CM | POA: Insufficient documentation

## 2011-05-11 MED ORDER — KETOROLAC TROMETHAMINE 60 MG/2ML IM SOLN
60.0000 mg | Freq: Once | INTRAMUSCULAR | Status: AC
  Administered 2011-05-11: 60 mg via INTRAMUSCULAR
  Filled 2011-05-11: qty 2

## 2011-05-11 MED ORDER — DIPHENHYDRAMINE HCL 50 MG/ML IJ SOLN
25.0000 mg | Freq: Once | INTRAMUSCULAR | Status: AC
  Administered 2011-05-11: 25 mg via INTRAMUSCULAR
  Filled 2011-05-11: qty 1

## 2011-05-11 MED ORDER — METOCLOPRAMIDE HCL 5 MG/ML IJ SOLN
10.0000 mg | Freq: Once | INTRAMUSCULAR | Status: AC
  Administered 2011-05-11: 10 mg via INTRAMUSCULAR
  Filled 2011-05-11: qty 2

## 2011-05-11 NOTE — ED Notes (Addendum)
C/o HA above left eye with pain to both eye-visual disturbance-started yesterday at 7pm-denies n/v-positive light sensitivity-pt wearing sunglasses-pt states she has rx glasses but does not wear them

## 2011-05-11 NOTE — ED Provider Notes (Signed)
History     CSN: [id number]  Arrival date & time 05/11/11  1528   First MD Initiated Contact with Patient 05/11/11 1548      Chief Complaint  Patient presents with  . Headache    (Consider location/radiation/quality/duration/timing/severity/associated sxs/prior treatment) HPI Comments: Pt states that she has sensitivity to light:pt states that she wears reading glasses and hasn't been wearing them:pt denies weakness, fever, numbness:pt denies migraines:nothing makes the pain better or worse and it located above both eyes  Patient is a 51 y.o. female presenting with headaches. The history is provided by the patient. No language interpreter was used.  Headache  This is a new problem. The current episode started yesterday. The problem occurs constantly. The problem has not changed since onset.The headache is associated with bright light. The pain is located in the frontal region. The quality of the pain is described as throbbing. The pain is moderate. The pain does not radiate. Associated symptoms include vomiting. Pertinent negatives include no anorexia, no fever, no chest pressure, no near-syncope and no nausea. She has tried nothing for the symptoms.    Past Medical History  Diagnosis Date  . Depression   . GERD (gastroesophageal reflux disease)   . Hypertension   . Lipoma   . Degenerative joint disease   . Bronchitis     hx of  . Obesity   . Hernia   . ENDOMETRIAL POLYP 01/08/2006    Dr. Roseanna Rainbow.  2001:  D&C, benign;  07/14/10:  D&C, hysteroscopy, polypectomy and bx of mons pubis (peau d'orange)...... 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    Past Surgical History  Procedure Date  . Cholecystectomy   . Resection of lipoma   . Umbilical hernia repair     No family history on file.  History  Substance Use Topics  . Smoking status: Never Smoker   . Smokeless tobacco: Not on file  . Alcohol Use: No    OB History    Grav Para Term  Preterm Abortions TAB SAB Ect Mult Living                  Review of Systems  Constitutional: Negative.  Negative for fever.  HENT: Negative for voice change.   Eyes: Positive for photophobia. Negative for visual disturbance.  Cardiovascular: Negative.  Negative for near-syncope.  Gastrointestinal: Positive for vomiting. Negative for nausea and anorexia.  Genitourinary: Negative.   Skin: Negative.   Neurological: Positive for headaches. Negative for dizziness.    Allergies  Codeine  Home Medications   Current Outpatient Rx  Name Route Sig Dispense Refill  . ASPIRIN 81 MG PO CHEW Oral Chew 81 mg by mouth daily.      Marland Kitchen CETIRIZINE HCL 10 MG PO TABS  TAKE 1 TABLET BY MOUTH ONCE A DAY 90 tablet 2  . ENALAPRIL MALEATE 20 MG PO TABS Oral Take 20 mg by mouth daily. Take tablets by mouth once a day     . HYDROCHLOROTHIAZIDE 25 MG PO TABS Oral Take 25 mg by mouth daily.      Marland Kitchen OMEPRAZOLE 20 MG PO CPDR Oral Take 1 capsule (20 mg total) by mouth 2 (two) times daily. 60 capsule 1  . OXYCODONE HCL 5 MG PO TABS Oral Take 1 tablet (5 mg total) by mouth every 6 (six) hours as needed for pain. 40 tablet 0  . TRAMADOL HCL 50 MG PO TABS Oral Take 1 tablet (50 mg total) by mouth every  6 (six) hours as needed for pain. 40 tablet 1    BP 142/62  Pulse 80  Temp(Src) 98.6 F (37 C) (Oral)  Resp 18  Ht 5\' 7"  (1.702 m)  Wt 349 lb 7 oz (158.504 kg)  BMI 54.73 kg/m2  SpO2 96%  Physical Exam  Nursing note and vitals reviewed. Constitutional: She is oriented to person, place, and time. She appears well-developed and well-nourished.  HENT:  Head: Normocephalic and atraumatic.  Right Ear: External ear normal.  Left Ear: External ear normal.  Mouth/Throat: Oropharynx is clear and moist.  Eyes: EOM are normal. Pupils are equal, round, and reactive to light.  Fundoscopic exam:      The right eye shows red reflex.      The left eye shows red reflex. Neck: Neck supple.  Cardiovascular: Normal rate  and regular rhythm.   Pulmonary/Chest: Effort normal and breath sounds normal.  Musculoskeletal: Normal range of motion.  Neurological: She is alert and oriented to person, place, and time. She exhibits normal muscle tone. Coordination normal.       No pronator drift:normal finger to nose  Skin: Skin is warm and dry.  Psychiatric: She has a normal mood and affect.    ED Course  Procedures (including critical care time)  Labs Reviewed - No data to display Ct Head Wo Contrast  05/11/2011  *RADIOLOGY REPORT*  Clinical Data: Left supraorbital headache.  Visual disturbances. Photophobia.  History of hypertension.  CT HEAD WITHOUT CONTRAST 05/11/2011:  Technique:  Contiguous axial images were obtained from the base of the skull through the vertex without contrast.  Comparison: Unenhanced cranial CT 09/13/2009 [facility].  Findings: Ventricular system normal in size and appearance for age. No mass lesion.  No midline shift.  No acute hemorrhage or hematoma.  No extra-axial fluid collections.  No evidence of acute infarction.  No focal brain parenchymal abnormality.  Note is again made of dense calcification along the tentorium and falx, a normal variant.  No significant interval change.  No focal osseous abnormality involving the skull.  Visualized paranasal sinuses, mastoid air cells, and middle ear cavities well- aerated.  IMPRESSION: No acute or significant abnormalities.  Stable examination.  Original Report Authenticated By: Arnell Sieving, M.D.     1. Headache       MDM  Pt feeling better at this time after a migraine cocktail:pt ct negative:pt neurologically intact        Teressa Lower, NP 05/11/11 1731

## 2011-05-11 NOTE — Discharge Instructions (Signed)

## 2011-05-12 NOTE — ED Provider Notes (Signed)
Medical screening examination/treatment/procedure(s) were performed by non-physician practitioner and as supervising physician I was immediately available for consultation/collaboration.   Rolan Bucco, MD 05/12/11 [phone]

## 2011-06-14 ENCOUNTER — Emergency Department (HOSPITAL_BASED_OUTPATIENT_CLINIC_OR_DEPARTMENT_OTHER)
Admission: EM | Admit: 2011-06-14 | Discharge: 2011-06-14 | Disposition: A | Discharge status: Home / Self Care | Payer: Medicare Other | Attending: Emergency Medicine | Admitting: Emergency Medicine

## 2011-06-14 ENCOUNTER — Encounter (HOSPITAL_BASED_OUTPATIENT_CLINIC_OR_DEPARTMENT_OTHER): Payer: Self-pay | Admitting: *Deleted

## 2011-06-14 DIAGNOSIS — R109 Unspecified abdominal pain: Secondary | ICD-10-CM | POA: Insufficient documentation

## 2011-06-14 DIAGNOSIS — K529 Noninfective gastroenteritis and colitis, unspecified: Secondary | ICD-10-CM

## 2011-06-14 DIAGNOSIS — R6883 Chills (without fever): Secondary | ICD-10-CM | POA: Insufficient documentation

## 2011-06-14 DIAGNOSIS — R197 Diarrhea, unspecified: Secondary | ICD-10-CM | POA: Insufficient documentation

## 2011-06-14 DIAGNOSIS — R110 Nausea: Secondary | ICD-10-CM | POA: Insufficient documentation

## 2011-06-14 DIAGNOSIS — K5289 Other specified noninfective gastroenteritis and colitis: Secondary | ICD-10-CM | POA: Insufficient documentation

## 2011-06-14 DIAGNOSIS — R12 Heartburn: Secondary | ICD-10-CM | POA: Insufficient documentation

## 2011-06-14 DIAGNOSIS — E669 Obesity, unspecified: Secondary | ICD-10-CM | POA: Insufficient documentation

## 2011-06-14 DIAGNOSIS — R10816 Epigastric abdominal tenderness: Secondary | ICD-10-CM | POA: Insufficient documentation

## 2011-06-14 DIAGNOSIS — I10 Essential (primary) hypertension: Secondary | ICD-10-CM | POA: Insufficient documentation

## 2011-06-14 LAB — COMPREHENSIVE METABOLIC PANEL
AST: 16 U/L (ref 0–37)
Albumin: 3.1 g/dL — ABNORMAL LOW (ref 3.5–5.2)
CO2: 28 mEq/L (ref 19–32)
Calcium: 10.6 mg/dL — ABNORMAL HIGH (ref 8.4–10.5)
Creatinine, Ser: 0.6 mg/dL (ref 0.50–1.10)
GFR calc non Af Amer: >90 mL/min (ref >90)

## 2011-06-14 LAB — URINALYSIS, ROUTINE W REFLEX MICROSCOPIC
Glucose, UA: NEGATIVE mg/dL
Hgb urine dipstick: NEGATIVE
Ketones, ur: 15 mg/dL — AB
Protein, ur: NEGATIVE mg/dL

## 2011-06-14 LAB — DIFFERENTIAL
Basophils Absolute: 0 10*3/uL (ref 0.0–0.1)
Basophils Relative: 0 % (ref 0–1)
Eosinophils Relative: 1 % (ref 0–5)
Monocytes Absolute: 1.3 10*3/uL — ABNORMAL HIGH (ref 0.1–1.0)

## 2011-06-14 LAB — CBC
HCT: 38.1 % (ref 36.0–46.0)
MCHC: 34.4 g/dL (ref 30.0–36.0)
MCV: 92 fL (ref 78.0–100.0)
RDW: 11.3 % — ABNORMAL LOW (ref 11.5–15.5)

## 2011-06-14 LAB — LIPASE, BLOOD: Lipase: 12 U/L (ref 11–59)

## 2011-06-14 MED ORDER — ONDANSETRON HCL 4 MG/2ML IJ SOLN
4.0000 mg | Freq: Once | INTRAMUSCULAR | Status: AC
  Administered 2011-06-14: 4 mg via INTRAVENOUS
  Filled 2011-06-14: qty 2

## 2011-06-14 MED ORDER — KETOROLAC TROMETHAMINE 30 MG/ML IJ SOLN
30.0000 mg | Freq: Once | INTRAMUSCULAR | Status: AC
  Administered 2011-06-14: 30 mg via INTRAVENOUS
  Filled 2011-06-14: qty 1

## 2011-06-14 MED ORDER — ONDANSETRON HCL 4 MG/2ML IJ SOLN
INTRAMUSCULAR | Status: AC
  Filled 2011-06-14: qty 2

## 2011-06-14 MED ORDER — SODIUM CHLORIDE 0.9 % IV SOLN
Freq: Once | INTRAVENOUS | Status: AC
  Administered 2011-06-14: 19:00:00 via INTRAVENOUS

## 2011-06-14 MED ORDER — PROMETHAZINE HCL 25 MG PO TABS: 25.0000 mg | ORAL_TABLET | Freq: Four times a day (QID) | ORAL | Status: DC | PRN

## 2011-06-14 MED ORDER — HYDROCODONE-ACETAMINOPHEN 5-500 MG PO TABS: 1.0000 | ORAL_TABLET | Freq: Four times a day (QID) | ORAL | Status: AC | PRN

## 2011-06-14 NOTE — ED Notes (Signed)
MD at bedside. 

## 2011-06-14 NOTE — Discharge Instructions (Signed)
Viral Gastroenteritis Viral gastroenteritis is also known as stomach flu. This condition affects the stomach and intestinal tract. It can cause sudden diarrhea and vomiting. The illness typically lasts 3 to 8 days. Most people develop an immune response that eventually gets rid of the virus. While this natural response develops, the virus can make you quite ill. CAUSES  Many different viruses can cause gastroenteritis, such as rotavirus or noroviruses. You can catch one of these viruses by consuming contaminated food or water. You may also catch a virus by sharing utensils or other personal items with an infected person or by touching a contaminated surface. SYMPTOMS  The most common symptoms are diarrhea and vomiting. These problems can cause a severe loss of body fluids (dehydration) and a body salt (electrolyte) imbalance. Other symptoms may include:  Fever.   Headache.   Fatigue.   Abdominal pain.  DIAGNOSIS  Your caregiver can usually diagnose viral gastroenteritis based on your symptoms and a physical exam. A stool sample may also be taken to test for the presence of viruses or other infections. TREATMENT  This illness typically goes away on its own. Treatments are aimed at rehydration. The most serious cases of viral gastroenteritis involve vomiting so severely that you are not able to keep fluids down. In these cases, fluids must be given through an intravenous line (IV). HOME CARE INSTRUCTIONS   Drink enough fluids to keep your urine clear or pale yellow. Drink small amounts of fluids frequently and increase the amounts as tolerated.   Ask your caregiver for specific rehydration instructions.   Avoid:   Foods high in sugar.   Alcohol.   Carbonated drinks.   Tobacco.   Juice.   Caffeine drinks.   Extremely hot or cold fluids.   Fatty, greasy foods.   Too much intake of anything at one time.   Dairy products until 24 to 48 hours after diarrhea stops.   You may  consume probiotics. Probiotics are active cultures of beneficial bacteria. They may lessen the amount and number of diarrheal stools in adults. Probiotics can be found in yogurt with active cultures and in supplements.   Wash your hands well to avoid spreading the virus.   Only take over-the-counter or prescription medicines for pain, discomfort, or fever as directed by your caregiver. Do not give aspirin to children. Antidiarrheal medicines are not recommended.   Ask your caregiver if you should continue to take your regular prescribed and over-the-counter medicines.   Keep all follow-up appointments as directed by your caregiver.  SEEK IMMEDIATE MEDICAL CARE IF:   You are unable to keep fluids down.   You do not urinate at least once every 6 to 8 hours.   You develop shortness of breath.   You notice blood in your stool or vomit. This may look like coffee grounds.   You have abdominal pain that increases or is concentrated in one small area (localized).   You have persistent vomiting or diarrhea.   You have a fever.   The patient is a child younger than 3 months, and he or she has a fever.   The patient is a child older than 3 months, and he or she has a fever and persistent symptoms.   The patient is a child older than 3 months, and he or she has a fever and symptoms suddenly get worse.   The patient is a baby, and he or she has no tears when crying.  MAKE SURE YOU:     Understand these instructions.   Will watch your condition.   Will get help right away if you are not doing well or get worse.  Document Released: 02/05/2005 Document Revised: 01/25/2011 Document Reviewed: 11/22/2010 [employer] Patient Information 2012 [employer]. 

## 2011-06-14 NOTE — ED Notes (Signed)
Lower abdominal cramping x 3 days. Diarrhea x 2 days and vomited 1 x  3 days ago.

## 2011-06-14 NOTE — ED Provider Notes (Signed)
History     CSN: [id number]  Arrival date & time 06/14/11  1818   First MD Initiated Contact with Patient 06/14/11 1827      No chief complaint on file.   (Consider location/radiation/quality/duration/timing/severity/associated sxs/prior treatment) Patient is a 51 y.o. female presenting with abdominal pain. The history is provided by the patient.  Abdominal Pain The primary symptoms of the illness include abdominal pain, nausea and diarrhea. The primary symptoms of the illness do not include fever or vomiting. Episode onset: 3-4 days ago. The onset of the illness was sudden. The problem has been gradually worsening.  The patient states that she believes she is currently not pregnant. Change in bowel habit: diarrhea. Additional symptoms associated with the illness include chills and heartburn. Associated medical issues comments: lap chole in past.    Past Medical History  Diagnosis Date  . Depression   . GERD (gastroesophageal reflux disease)   . Hypertension   . Lipoma   . Degenerative joint disease   . Bronchitis     hx of  . Obesity   . Hernia   . ENDOMETRIAL POLYP 01/08/2006    Dr. Roseanna Rainbow.  2001:  D&C, benign;  07/14/10:  D&C, hysteroscopy, polypectomy and bx of mons pubis (peau d'orange)...... 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    Past Surgical History  Procedure Date  . Cholecystectomy   . Resection of lipoma   . Umbilical hernia repair     No family history on file.  History  Substance Use Topics  . Smoking status: Never Smoker   . Smokeless tobacco: Not on file  . Alcohol Use: No    OB History    Grav Para Term Preterm Abortions TAB SAB Ect Mult Living                  Review of Systems  Constitutional: Positive for chills. Negative for fever.  Gastrointestinal: Positive for heartburn, nausea, abdominal pain and diarrhea. Negative for vomiting.  All other systems reviewed and are negative.    Allergies    Codeine  Home Medications   Current Outpatient Rx  Name Route Sig Dispense Refill  . ASPIRIN 81 MG PO CHEW Oral Chew 81 mg by mouth daily.      Marland Kitchen CETIRIZINE HCL 10 MG PO TABS  TAKE 1 TABLET BY MOUTH ONCE A DAY 90 tablet 2  . ENALAPRIL MALEATE 20 MG PO TABS Oral Take 20 mg by mouth daily. Take tablets by mouth once a day     . HYDROCHLOROTHIAZIDE 25 MG PO TABS Oral Take 25 mg by mouth daily.      Marland Kitchen OMEPRAZOLE 20 MG PO CPDR Oral Take 1 capsule (20 mg total) by mouth 2 (two) times daily. 60 capsule 1  . OXYCODONE HCL 5 MG PO TABS Oral Take 1 tablet (5 mg total) by mouth every 6 (six) hours as needed for pain. 40 tablet 0  . TRAMADOL HCL 50 MG PO TABS Oral Take 1 tablet (50 mg total) by mouth every 6 (six) hours as needed for pain. 40 tablet 1    BP 135/88  Pulse 95  Temp(Src) 99.1 F (37.3 C) (Oral)  Resp 22  SpO2 98%  Physical Exam  Nursing note and vitals reviewed. Constitutional: She is oriented to person, place, and time. She appears well-developed and well-nourished. No distress.  HENT:  Head: Normocephalic and atraumatic.  Neck: Normal range of motion. Neck supple.  Cardiovascular: Normal rate and regular  rhythm.   Pulmonary/Chest: Effort normal and breath sounds normal.  Abdominal: Soft. Bowel sounds are normal.       Patient is obese.  She is ttp in the epigastrium and epigastric region.  When abd is palpated, it triggers spasms.  No rebound or guarding.  Musculoskeletal: Normal range of motion. She exhibits no edema.  Neurological: She is alert and oriented to person, place, and time.  Skin: Skin is warm and dry. She is not diaphoretic.    ED Course  Procedures (including critical care time)  Labs Reviewed  URINALYSIS, ROUTINE W REFLEX MICROSCOPIC - Abnormal; Notable for the following:    Color, Urine AMBER (*) BIOCHEMICALS MAY BE AFFECTED BY COLOR   APPearance CLOUDY (*)    Ketones, ur 15 (*)    All other components within normal limits   No results  found.   No diagnosis found.    MDM  Labs, presentation, exam consistent with viral gastroenteritis.  Will discharge with pain meds, nausea meds.        Geoffery Lyons, MD 06/14/11 2003

## 2011-08-02 ENCOUNTER — Other Ambulatory Visit: Payer: Self-pay | Admitting: Internal Medicine

## 2011-08-06 NOTE — Telephone Encounter (Signed)
Pharmacy aware reason of denial.

## 2011-10-05 ENCOUNTER — Encounter (HOSPITAL_BASED_OUTPATIENT_CLINIC_OR_DEPARTMENT_OTHER): Payer: Self-pay | Admitting: *Deleted

## 2011-10-05 ENCOUNTER — Emergency Department (HOSPITAL_BASED_OUTPATIENT_CLINIC_OR_DEPARTMENT_OTHER)
Admission: EM | Admit: 2011-10-05 | Discharge: 2011-10-05 | Disposition: A | Discharge status: Home / Self Care | Payer: Medicare Other | Attending: Emergency Medicine | Admitting: Emergency Medicine

## 2011-10-05 DIAGNOSIS — I10 Essential (primary) hypertension: Secondary | ICD-10-CM | POA: Insufficient documentation

## 2011-10-05 DIAGNOSIS — S2002XA Contusion of left breast, initial encounter: Secondary | ICD-10-CM

## 2011-10-05 DIAGNOSIS — M1990 Unspecified osteoarthritis, unspecified site: Secondary | ICD-10-CM | POA: Insufficient documentation

## 2011-10-05 DIAGNOSIS — W19XXXA Unspecified fall, initial encounter: Secondary | ICD-10-CM

## 2011-10-05 DIAGNOSIS — S2000XA Contusion of breast, unspecified breast, initial encounter: Secondary | ICD-10-CM | POA: Insufficient documentation

## 2011-10-05 DIAGNOSIS — K219 Gastro-esophageal reflux disease without esophagitis: Secondary | ICD-10-CM | POA: Insufficient documentation

## 2011-10-05 DIAGNOSIS — E669 Obesity, unspecified: Secondary | ICD-10-CM | POA: Insufficient documentation

## 2011-10-05 NOTE — ED Notes (Signed)
Patient states she tripped and fell in her bedroom and landed on her nightstand on her left breast 4 days ago.  C/O pain left breast and bilateral knees.

## 2011-10-05 NOTE — ED Provider Notes (Signed)
History     CSN: [id number]  Arrival date & time 10/05/11  1238   First MD Initiated Contact with Patient 10/05/11 1315      Chief Complaint  Patient presents with  . Fall    (Consider location/radiation/quality/duration/timing/severity/associated sxs/prior treatment) HPI Patient presents with complaint of pain in her left breast after a fall forward hitting her breast on her nightstand. The fall occurred 4 days ago. She states the area in her left breast continues to be sore and swollen. She has not had any nipple discharge or break in the skin. She also complains of some soreness in her knees and bilateral feet as she fell forward onto her knees. However she has been able to ambulate normally. She denies neck or back pain. Palpation makes pain worse. She has not tried anything specific for her symptoms.  There are no other associated systemic symptoms, there are no other alleviating or modifying factors.   Past Medical History  Diagnosis Date  . Depression   . GERD (gastroesophageal reflux disease)   . Hypertension   . Lipoma   . Degenerative joint disease   . Bronchitis     hx of  . Obesity   . Hernia   . ENDOMETRIAL POLYP 01/08/2006    Dr. Roseanna Rainbow.  2001:  D&C, benign;  07/14/10:  D&C, hysteroscopy, polypectomy and bx of mons pubis (peau d'orange)...... 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    Past Surgical History  Procedure Date  . Cholecystectomy   . Resection of lipoma   . Umbilical hernia repair     No family history on file.  History  Substance Use Topics  . Smoking status: Never Smoker   . Smokeless tobacco: Not on file  . Alcohol Use: No    OB History    Grav Para Term Preterm Abortions TAB SAB Ect Mult Living                  Review of Systems ROS reviewed and all otherwise negative except for mentioned in HPI  Allergies  Codeine  Home Medications   Current Outpatient Rx  Name Route Sig Dispense Refill  .  ASPIRIN 81 MG PO CHEW Oral Chew 81 mg by mouth daily.      Marland Kitchen CETIRIZINE HCL 10 MG PO TABS  TAKE 1 TABLET BY MOUTH ONCE A DAY 90 tablet 2  . ENALAPRIL MALEATE 20 MG PO TABS Oral Take 20 mg by mouth daily. Take tablets by mouth once a day     . HYDROCHLOROTHIAZIDE 25 MG PO TABS Oral Take 25 mg by mouth daily.      Marland Kitchen OMEPRAZOLE 20 MG PO CPDR Oral Take 1 capsule (20 mg total) by mouth 2 (two) times daily. 60 capsule 1  . PROMETHAZINE HCL 25 MG PO TABS Oral Take 1 tablet (25 mg total) by mouth every 6 (six) hours as needed for nausea. 30 tablet 0  . PROMETHAZINE HCL 25 MG PO TABS Oral Take 1 tablet (25 mg total) by mouth every 6 (six) hours as needed for nausea. 10 tablet 0  . TRAMADOL HCL 50 MG PO TABS Oral Take 1 tablet (50 mg total) by mouth every 6 (six) hours as needed for pain. 40 tablet 1    BP 141/75  Pulse 71  Temp 98.2 F (36.8 C) (Oral)  Resp 20  Ht 5\' 6"  (1.676 m)  Wt 370 lb (167.831 kg)  BMI 59.72 kg/m2  SpO2 97% Vitals  reviewed Physical Exam Physical Examination: General appearance - alert, well appearing, and in no distress Mental status - alert, oriented to person, place, and time Neck - supple, no midline tenderness to palpation Chest - clear to auscultation, no wheezes, rales or rhonchi, symmetric air entry Heart - normal rate, regular rhythm, normal S1, S2, no murmurs, rubs, clicks or gallops Abdomen - soft, nontender, nondistended, no masses or organomegaly Breasts - breasts appear normal with the exception of approx 2cm area of contusion overlying mid upper breast- no warmth or overlying erythema, no suspicious masses, no skin or nipple changes or axillary nodes Musculoskeletal - no joint tenderness, deformity or swelling Extremities - peripheral pulses normal, no pedal edema, no clubbing or cyanosis Skin - normal coloration and turgor, no rashes  ED Course  Procedures (including critical care time)  Labs Reviewed - No data to display No results found.   1.  Contusion of breast, left   2. Fall       MDM  Pt presenting with c/o breast pain after fall and hitting left breast on nightstand.  Contusion overlying left breast tissue- no sign of warmth/infection or abscess.  No nipple discharge.  Pt states she has a PMD and has gotten regular mammograms and plans to continue to do so.  Discharged with strict return precautions.  Pt agreeable with plan.        Ethelda Chick, MD 10/06/11 [phone]

## 2011-10-29 ENCOUNTER — Encounter (INDEPENDENT_AMBULATORY_CARE_PROVIDER_SITE_OTHER): Payer: Medicare Other | Admitting: Surgery

## 2011-11-13 ENCOUNTER — Encounter (INDEPENDENT_AMBULATORY_CARE_PROVIDER_SITE_OTHER): Payer: Medicare Other | Admitting: Surgery

## 2011-11-20 ENCOUNTER — Encounter (INDEPENDENT_AMBULATORY_CARE_PROVIDER_SITE_OTHER): Payer: Self-pay | Admitting: Surgery

## 2012-01-29 ENCOUNTER — Encounter (INDEPENDENT_AMBULATORY_CARE_PROVIDER_SITE_OTHER): Payer: Medicare Other | Admitting: Surgery

## 2012-02-22 ENCOUNTER — Encounter (INDEPENDENT_AMBULATORY_CARE_PROVIDER_SITE_OTHER): Payer: Self-pay | Admitting: Surgery

## 2012-03-10 ENCOUNTER — Encounter (INDEPENDENT_AMBULATORY_CARE_PROVIDER_SITE_OTHER): Payer: Medicare Other | Admitting: Surgery

## 2012-03-11 ENCOUNTER — Encounter (INDEPENDENT_AMBULATORY_CARE_PROVIDER_SITE_OTHER): Payer: Self-pay | Admitting: Surgery

## 2012-08-20 ENCOUNTER — Emergency Department (HOSPITAL_BASED_OUTPATIENT_CLINIC_OR_DEPARTMENT_OTHER)
Admission: EM | Admit: 2012-08-20 | Discharge: 2012-08-20 | Disposition: A | Discharge status: Home / Self Care | Payer: Medicare HMO | Attending: Emergency Medicine | Admitting: Emergency Medicine

## 2012-08-20 ENCOUNTER — Encounter (HOSPITAL_BASED_OUTPATIENT_CLINIC_OR_DEPARTMENT_OTHER): Payer: Self-pay

## 2012-08-20 DIAGNOSIS — Z7982 Long term (current) use of aspirin: Secondary | ICD-10-CM | POA: Insufficient documentation

## 2012-08-20 DIAGNOSIS — R059 Cough, unspecified: Secondary | ICD-10-CM | POA: Insufficient documentation

## 2012-08-20 DIAGNOSIS — E669 Obesity, unspecified: Secondary | ICD-10-CM | POA: Insufficient documentation

## 2012-08-20 DIAGNOSIS — Z8719 Personal history of other diseases of the digestive system: Secondary | ICD-10-CM | POA: Insufficient documentation

## 2012-08-20 DIAGNOSIS — Z79899 Other long term (current) drug therapy: Secondary | ICD-10-CM | POA: Insufficient documentation

## 2012-08-20 DIAGNOSIS — Z8742 Personal history of other diseases of the female genital tract: Secondary | ICD-10-CM | POA: Insufficient documentation

## 2012-08-20 DIAGNOSIS — I10 Essential (primary) hypertension: Secondary | ICD-10-CM | POA: Insufficient documentation

## 2012-08-20 DIAGNOSIS — F329 Major depressive disorder, single episode, unspecified: Secondary | ICD-10-CM | POA: Insufficient documentation

## 2012-08-20 DIAGNOSIS — J020 Streptococcal pharyngitis: Secondary | ICD-10-CM | POA: Insufficient documentation

## 2012-08-20 DIAGNOSIS — Z872 Personal history of diseases of the skin and subcutaneous tissue: Secondary | ICD-10-CM | POA: Insufficient documentation

## 2012-08-20 DIAGNOSIS — IMO0002 Reserved for concepts with insufficient information to code with codable children: Secondary | ICD-10-CM | POA: Insufficient documentation

## 2012-08-20 DIAGNOSIS — K219 Gastro-esophageal reflux disease without esophagitis: Secondary | ICD-10-CM | POA: Insufficient documentation

## 2012-08-20 DIAGNOSIS — Z8709 Personal history of other diseases of the respiratory system: Secondary | ICD-10-CM | POA: Insufficient documentation

## 2012-08-20 DIAGNOSIS — F3289 Other specified depressive episodes: Secondary | ICD-10-CM | POA: Insufficient documentation

## 2012-08-20 DIAGNOSIS — H9209 Otalgia, unspecified ear: Secondary | ICD-10-CM | POA: Insufficient documentation

## 2012-08-20 DIAGNOSIS — R05 Cough: Secondary | ICD-10-CM | POA: Insufficient documentation

## 2012-08-20 LAB — RAPID STREP SCREEN (MED CTR MEBANE ONLY): Streptococcus, Group A Screen (Direct): POSITIVE — AB

## 2012-08-20 MED ORDER — DEXAMETHASONE SODIUM PHOSPHATE 10 MG/ML IJ SOLN
10.0000 mg | Freq: Once | INTRAMUSCULAR | Status: AC
  Administered 2012-08-20: 10 mg via INTRAMUSCULAR
  Filled 2012-08-20: qty 1

## 2012-08-20 MED ORDER — PENICILLIN G BENZATHINE 1200000 UNIT/2ML IM SUSP
1.2000 10*6.[IU] | Freq: Once | INTRAMUSCULAR | Status: AC
  Administered 2012-08-20: 1.2 10*6.[IU] via INTRAMUSCULAR
  Filled 2012-08-20: qty 2

## 2012-08-20 NOTE — ED Notes (Signed)
Pt reports sore throat and bilateral ear pain 

## 2012-08-20 NOTE — ED Provider Notes (Signed)
History    CSN: [id number] Arrival date & time 08/20/12  1246  First MD Initiated Contact with Patient 08/20/12 1256     Chief Complaint  Patient presents with  . Sore Throat  . Otalgia   (Consider location/radiation/quality/duration/timing/severity/associated sxs/prior Treatment) HPI Comments: Sore throat and bilateral ear pain  Patient is a 52 y.o. female presenting with pharyngitis. The history is provided by the patient. No language interpreter was used.  Sore Throat This is a new problem. The current episode started yesterday. The problem occurs constantly. The problem has been unchanged. Associated symptoms include coughing. Nothing aggravates the symptoms. She has tried nothing for the symptoms.   Past Medical History  Diagnosis Date  . Depression   . GERD (gastroesophageal reflux disease)   . Hypertension   . Lipoma   . Degenerative joint disease   . Bronchitis     hx of  . Obesity   . Hernia   . ENDOMETRIAL POLYP 01/08/2006    Dr. Roseanna Rainbow.  2001:  D&C, benign;  07/14/10:  D&C, hysteroscopy, polypectomy and bx of mons pubis (peau d'orange)...... 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   Past Surgical History  Procedure Laterality Date  . Cholecystectomy    . Resection of lipoma    . Umbilical hernia repair     No family history on file. History  Substance Use Topics  . Smoking status: Never Smoker   . Smokeless tobacco: Not on file  . Alcohol Use: No   OB History   Grav Para Term Preterm Abortions TAB SAB Ect Mult Living                 Review of Systems  Constitutional: Negative.   Respiratory: Positive for cough.   Cardiovascular: Negative.     Allergies  Codeine  Home Medications   Current Outpatient Rx  Name  Route  Sig  Dispense  Refill  . ESCITALOPRAM OXALATE PO   Oral   Take by mouth.         . Gabapentin (NEURONTIN PO)   Oral   Take by mouth.         . simvastatin (ZOCOR) 40 MG tablet    Oral   Take 40 mg by mouth every evening.         . solifenacin (VESICARE) 10 MG tablet   Oral   Take by mouth daily.         Marland Kitchen aspirin (BL ADULT ASPIRIN LOW STRENGTH) 81 MG chewable tablet   Oral   Chew 81 mg by mouth daily.           . cetirizine (ZYRTEC) 10 MG tablet      TAKE 1 TABLET BY MOUTH ONCE A DAY   90 tablet   2   . enalapril (VASOTEC) 20 MG tablet   Oral   Take 20 mg by mouth daily. Take tablets by mouth once a day          . EXPIRED: escitalopram (LEXAPRO) 20 MG tablet   Oral   Take 1 tablet (20 mg total) by mouth daily.   90 tablet   2   . hydrochlorothiazide 25 MG tablet   Oral   Take 25 mg by mouth daily.           Marland Kitchen omeprazole (PRILOSEC) 20 MG capsule   Oral   Take 1 capsule (20 mg total) by mouth 2 (two) times daily.   60 capsule  1   . EXPIRED: simvastatin (ZOCOR) 40 MG tablet   Oral   Take 1 tablet (40 mg total) by mouth every evening.   30 tablet   11   . traMADol (ULTRAM) 50 MG tablet   Oral   Take 1 tablet (50 mg total) by mouth every 6 (six) hours as needed for pain.   40 tablet   1    BP 131/84  Pulse 104  Temp(Src) 98.4 F (36.9 C) (Oral)  Resp 22  Ht 5\' 7"  (1.702 m)  Wt 360 lb (163.295 kg)  BMI 56.37 kg/m2  SpO2 96%  LMP 08/06/2012 Physical Exam  Nursing note and vitals reviewed. Constitutional: She appears well-developed and well-nourished.  HENT:  Head: Normocephalic and atraumatic.  Right Ear: External ear normal.  Left Ear: External ear normal.  Mouth/Throat: Posterior oropharyngeal edema and posterior oropharyngeal erythema present.  Cardiovascular: Normal rate and regular rhythm.   Pulmonary/Chest: Effort normal and breath sounds normal.    ED Course  Procedures (including critical care time) Labs Reviewed  RAPID STREP SCREEN - Abnormal; Notable for the following:    Streptococcus, Group A Screen (Direct) POSITIVE (*)    All other components within normal limits   No results found. 1. Strep  pharyngitis     MDM  Pt is positive for strep:will treat pt here  Teressa Lower, NP 08/20/12 1335

## 2012-08-20 NOTE — ED Provider Notes (Signed)
Medical screening examination/treatment/procedure(s) were performed by non-physician practitioner and as supervising physician I was immediately available for consultation/collaboration.   Charles B. Sheldon, MD 08/20/12 1340 

## 2012-08-29 ENCOUNTER — Encounter: Payer: Self-pay | Admitting: Obstetrics & Gynecology

## 2012-08-29 ENCOUNTER — Other Ambulatory Visit: Payer: Self-pay

## 2012-08-29 ENCOUNTER — Ambulatory Visit (INDEPENDENT_AMBULATORY_CARE_PROVIDER_SITE_OTHER): Payer: Medicare Other | Admitting: Obstetrics & Gynecology

## 2012-08-29 ENCOUNTER — Other Ambulatory Visit: Payer: Self-pay | Admitting: Obstetrics & Gynecology

## 2012-08-29 VITALS — BP 122/85 | HR 85 | Temp 98.2°F | Ht 67.0 in | Wt 363.0 lb

## 2012-08-29 DIAGNOSIS — Z3202 Encounter for pregnancy test, result negative: Secondary | ICD-10-CM

## 2012-08-29 DIAGNOSIS — N939 Abnormal uterine and vaginal bleeding, unspecified: Secondary | ICD-10-CM

## 2012-08-29 DIAGNOSIS — N926 Irregular menstruation, unspecified: Secondary | ICD-10-CM

## 2012-08-29 LAB — HEMOGLOBIN AND HEMATOCRIT, BLOOD
HCT: 36.4 % (ref 36.0–46.0)
Hemoglobin: 12.1 g/dL (ref 12.0–15.0)

## 2012-08-29 LAB — POCT URINE PREGNANCY: Preg Test, Ur: NEGATIVE

## 2012-08-29 MED ORDER — TRANEXAMIC ACID 650 MG PO TABS: 1300.0000 mg | ORAL_TABLET | Freq: Three times a day (TID) | ORAL | Status: DC

## 2012-08-29 NOTE — Progress Notes (Signed)
.   Subjective:     Ellen [name] is a 52 y.o. female here for a problem exam.  Current complaints:Patinet has been bleeding since 08/06/2012 - Light to heavy flow with large clots.  Personal health questionnaire reviewed: no.   Gynecologic History Patient's last menstrual period was 08/06/2012. Contraception: none Last Pap: 03/2008. Results were: normal.  Endo biopsy - 02/2010 (negative) Last mammogram: 2013. Results were:  Normal.  Has an appointment next week.  Obstetric History OB History   Grav Para Term Preterm Abortions TAB SAB Ect Mult Living                   The following portions of the patient's history were reviewed and updated as appropriate: allergies, current medications, past family history, past medical history, past social history, past surgical history and problem list.  Review of Systems Pertinent items are noted in HPI.    Objective:      General:  alert     Abdomen: soft, non-tender; bowel sounds normal; no masses,  no organomegaly   Vulva:  normal  Vagina: normal vagina  Cervix:  no lesions  Corpus: Exam limited by body habitus  Adnexa:  normal adnexa      Consent was obtained for an endometrial biopsy.  The cervix was prepped.  A single tooth tenaculum was applied to the anterior lip of the cervix.  Two passes were made with the [place].  The uterus sounded to 7 cm. Assessment:    Perimenopausal, AUB  Current episode of prolonged bleeding Plan:   TVS [place] Pt education materials given re: alternatives to hysterectomy Return after the TVS

## 2012-08-29 NOTE — Patient Instructions (Signed)

## 2012-09-01 LAB — PAP IG, CT-NG, RFX HPV ASCU
Chlamydia Probe Amp: NEGATIVE
GC Probe Amp: NEGATIVE

## 2012-09-03 IMAGING — CR DG CHEST 2V
2 series · 2 of 2 positions shown · non-contrast
Comparison: 02/14/2008.

CLINICAL DATA: Preop.

CHEST - 2 VIEW

[w chest pa]
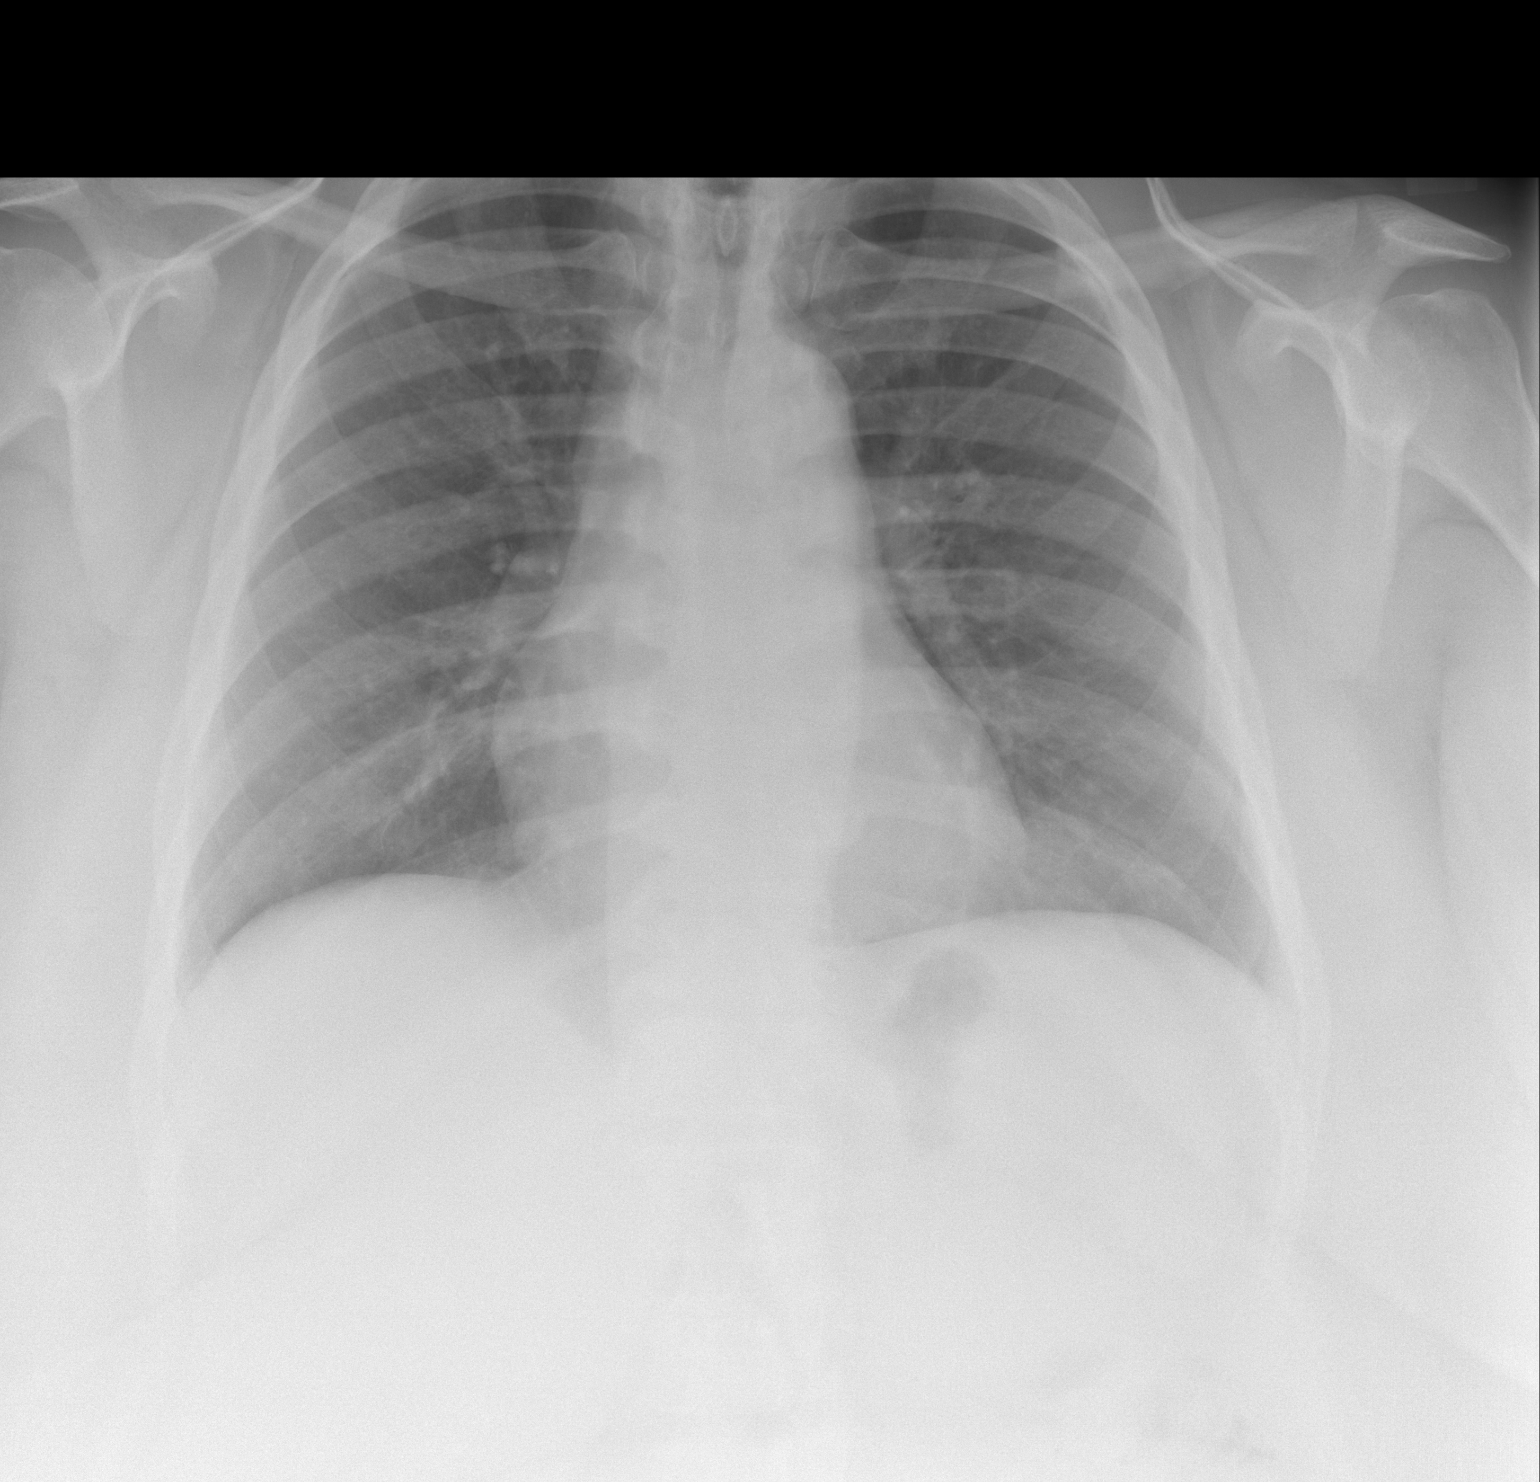

[w chest lat *]
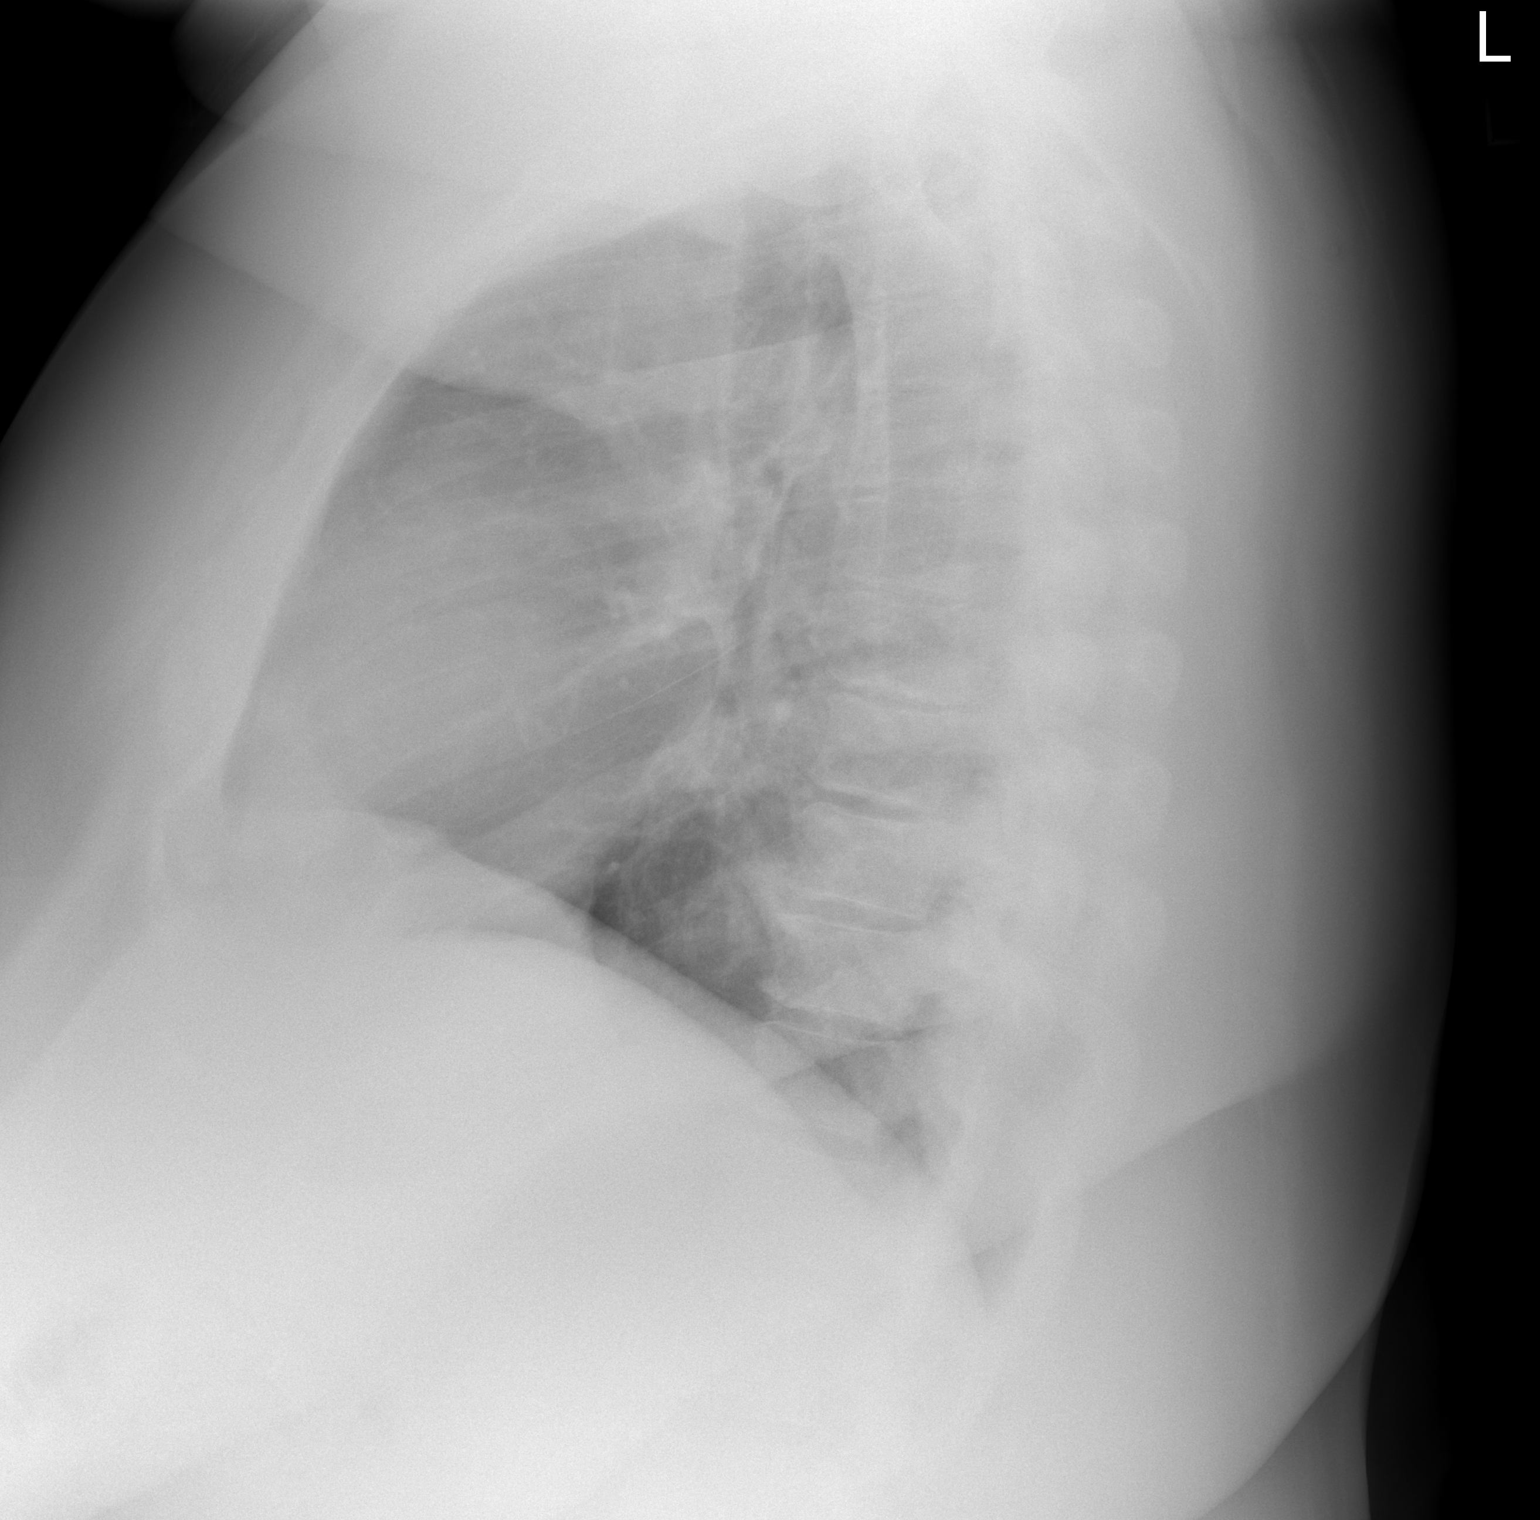

[2 of 2 positions shown; findings below may reference images not displayed]

FINDINGS: Trachea is midline.  Heart size normal.  Lungs are clear.
No pleural fluid.
IMPRESSION: No acute findings.

## 2012-09-04 ENCOUNTER — Ambulatory Visit (HOSPITAL_COMMUNITY)
Admission: RE | Admit: 2012-09-04 | Discharge: 2012-09-04 | Disposition: A | Discharge status: Home / Self Care | Payer: Medicare HMO | Source: Ambulatory Visit | Attending: Obstetrics & Gynecology | Admitting: Obstetrics & Gynecology

## 2012-09-04 DIAGNOSIS — N926 Irregular menstruation, unspecified: Secondary | ICD-10-CM

## 2012-09-04 DIAGNOSIS — N950 Postmenopausal bleeding: Secondary | ICD-10-CM | POA: Insufficient documentation

## 2012-12-25 ENCOUNTER — Other Ambulatory Visit: Payer: Self-pay | Admitting: *Deleted

## 2012-12-25 DIAGNOSIS — N390 Urinary tract infection, site not specified: Secondary | ICD-10-CM

## 2012-12-25 MED ORDER — SULFAMETHOXAZOLE-TMP DS 800-160 MG PO TABS: 1.0000 | ORAL_TABLET | Freq: Two times a day (BID) | ORAL | Status: DC

## 2013-09-23 ENCOUNTER — Ambulatory Visit (INDEPENDENT_AMBULATORY_CARE_PROVIDER_SITE_OTHER): Payer: Medicare HMO | Admitting: Surgery

## 2013-10-06 ENCOUNTER — Ambulatory Visit (INDEPENDENT_AMBULATORY_CARE_PROVIDER_SITE_OTHER): Payer: Medicare HMO | Admitting: Surgery

## 2013-10-14 ENCOUNTER — Encounter (INDEPENDENT_AMBULATORY_CARE_PROVIDER_SITE_OTHER): Payer: Self-pay | Admitting: Surgery

## 2014-08-10 ENCOUNTER — Encounter (HOSPITAL_BASED_OUTPATIENT_CLINIC_OR_DEPARTMENT_OTHER): Payer: Self-pay | Admitting: Emergency Medicine

## 2014-08-10 ENCOUNTER — Emergency Department (HOSPITAL_BASED_OUTPATIENT_CLINIC_OR_DEPARTMENT_OTHER)
Admission: EM | Admit: 2014-08-10 | Discharge: 2014-08-10 | Disposition: A | Discharge status: Home / Self Care | Payer: No Typology Code available for payment source | Attending: Emergency Medicine | Admitting: Emergency Medicine

## 2014-08-10 DIAGNOSIS — Z792 Long term (current) use of antibiotics: Secondary | ICD-10-CM | POA: Insufficient documentation

## 2014-08-10 DIAGNOSIS — Z8572 Personal history of non-Hodgkin lymphomas: Secondary | ICD-10-CM | POA: Diagnosis not present

## 2014-08-10 DIAGNOSIS — Z79899 Other long term (current) drug therapy: Secondary | ICD-10-CM | POA: Insufficient documentation

## 2014-08-10 DIAGNOSIS — S3992XA Unspecified injury of lower back, initial encounter: Secondary | ICD-10-CM | POA: Diagnosis not present

## 2014-08-10 DIAGNOSIS — Z8709 Personal history of other diseases of the respiratory system: Secondary | ICD-10-CM | POA: Diagnosis not present

## 2014-08-10 DIAGNOSIS — E669 Obesity, unspecified: Secondary | ICD-10-CM | POA: Diagnosis not present

## 2014-08-10 DIAGNOSIS — Z7982 Long term (current) use of aspirin: Secondary | ICD-10-CM | POA: Insufficient documentation

## 2014-08-10 DIAGNOSIS — Y998 Other external cause status: Secondary | ICD-10-CM | POA: Insufficient documentation

## 2014-08-10 DIAGNOSIS — M1990 Unspecified osteoarthritis, unspecified site: Secondary | ICD-10-CM | POA: Diagnosis not present

## 2014-08-10 DIAGNOSIS — F329 Major depressive disorder, single episode, unspecified: Secondary | ICD-10-CM | POA: Diagnosis not present

## 2014-08-10 DIAGNOSIS — M545 Low back pain, unspecified: Secondary | ICD-10-CM

## 2014-08-10 DIAGNOSIS — Y92410 Unspecified street and highway as the place of occurrence of the external cause: Secondary | ICD-10-CM | POA: Insufficient documentation

## 2014-08-10 DIAGNOSIS — K219 Gastro-esophageal reflux disease without esophagitis: Secondary | ICD-10-CM | POA: Diagnosis not present

## 2014-08-10 DIAGNOSIS — Z87448 Personal history of other diseases of urinary system: Secondary | ICD-10-CM | POA: Insufficient documentation

## 2014-08-10 DIAGNOSIS — S199XXA Unspecified injury of neck, initial encounter: Secondary | ICD-10-CM | POA: Insufficient documentation

## 2014-08-10 DIAGNOSIS — Y9389 Activity, other specified: Secondary | ICD-10-CM | POA: Diagnosis not present

## 2014-08-10 DIAGNOSIS — M542 Cervicalgia: Secondary | ICD-10-CM

## 2014-08-10 MED ORDER — IBUPROFEN 600 MG PO TABS: 600.0000 mg | ORAL_TABLET | Freq: Four times a day (QID) | ORAL | Status: DC | PRN

## 2014-08-10 NOTE — Discharge Instructions (Signed)
Abdominal Pain Many things can cause belly (abdominal) pain. Most times, the belly pain is not dangerous. Many cases of belly pain can be watched and treated at home. HOME CARE   Do not take medicines that help you go poop (laxatives) unless told to by your doctor.  Only take medicine as told by your doctor.  Eat or drink as told by your doctor. Your doctor will tell you if you should be on a special diet. GET HELP IF:  You do not know what is causing your belly pain.  You have belly pain while you are sick to your stomach (nauseous) or have runny poop (diarrhea).  You have pain while you pee or poop.  Your belly pain wakes you up at night.  You have belly pain that gets worse or better when you eat.  You have belly pain that gets worse when you eat fatty foods.  You have a fever. GET HELP RIGHT AWAY IF:   The pain does not go away within 2 hours.  You keep throwing up (vomiting).  The pain changes and is only in the right or left part of the belly.  You have bloody or tarry looking poop. MAKE SURE YOU:   Understand these instructions.  Will watch your condition.  Will get help right away if you are not doing well or get worse. Document Released: 07/25/2007 Document Revised: 02/10/2013 Document Reviewed: 10/15/2012 [facility] Patient Information 2015 [place], [place]. This information is not intended to replace advice given to you by your health care provider. Make sure you discuss any questions you have with your health care provider.  Follow up with [facility] further evaluation of symptoms, return immediately if new worsening signs or symptoms present
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
## 2014-12-29 ENCOUNTER — Encounter (HOSPITAL_BASED_OUTPATIENT_CLINIC_OR_DEPARTMENT_OTHER): Payer: Self-pay

## 2014-12-29 ENCOUNTER — Emergency Department (HOSPITAL_BASED_OUTPATIENT_CLINIC_OR_DEPARTMENT_OTHER): Payer: Medicare Other

## 2014-12-29 ENCOUNTER — Emergency Department (HOSPITAL_BASED_OUTPATIENT_CLINIC_OR_DEPARTMENT_OTHER)
Admission: EM | Admit: 2014-12-29 | Discharge: 2014-12-29 | Disposition: A | Discharge status: Home / Self Care | Payer: Medicare Other | Attending: Emergency Medicine | Admitting: Emergency Medicine

## 2014-12-29 DIAGNOSIS — E669 Obesity, unspecified: Secondary | ICD-10-CM | POA: Diagnosis not present

## 2014-12-29 DIAGNOSIS — M545 Low back pain, unspecified: Secondary | ICD-10-CM

## 2014-12-29 DIAGNOSIS — Z7982 Long term (current) use of aspirin: Secondary | ICD-10-CM | POA: Insufficient documentation

## 2014-12-29 DIAGNOSIS — Y998 Other external cause status: Secondary | ICD-10-CM | POA: Insufficient documentation

## 2014-12-29 DIAGNOSIS — I10 Essential (primary) hypertension: Secondary | ICD-10-CM | POA: Diagnosis not present

## 2014-12-29 DIAGNOSIS — S79911A Unspecified injury of right hip, initial encounter: Secondary | ICD-10-CM | POA: Insufficient documentation

## 2014-12-29 DIAGNOSIS — K219 Gastro-esophageal reflux disease without esophagitis: Secondary | ICD-10-CM | POA: Insufficient documentation

## 2014-12-29 DIAGNOSIS — Z79899 Other long term (current) drug therapy: Secondary | ICD-10-CM | POA: Diagnosis not present

## 2014-12-29 DIAGNOSIS — S3992XA Unspecified injury of lower back, initial encounter: Secondary | ICD-10-CM | POA: Diagnosis not present

## 2014-12-29 DIAGNOSIS — M25561 Pain in right knee: Secondary | ICD-10-CM

## 2014-12-29 DIAGNOSIS — Z8709 Personal history of other diseases of the respiratory system: Secondary | ICD-10-CM | POA: Diagnosis not present

## 2014-12-29 DIAGNOSIS — Y9289 Other specified places as the place of occurrence of the external cause: Secondary | ICD-10-CM | POA: Insufficient documentation

## 2014-12-29 DIAGNOSIS — F329 Major depressive disorder, single episode, unspecified: Secondary | ICD-10-CM | POA: Diagnosis not present

## 2014-12-29 DIAGNOSIS — R55 Syncope and collapse: Secondary | ICD-10-CM | POA: Diagnosis not present

## 2014-12-29 DIAGNOSIS — S8991XA Unspecified injury of right lower leg, initial encounter: Secondary | ICD-10-CM | POA: Insufficient documentation

## 2014-12-29 DIAGNOSIS — Y9389 Activity, other specified: Secondary | ICD-10-CM | POA: Insufficient documentation

## 2014-12-29 DIAGNOSIS — Z8572 Personal history of non-Hodgkin lymphomas: Secondary | ICD-10-CM | POA: Diagnosis not present

## 2014-12-29 DIAGNOSIS — W06XXXA Fall from bed, initial encounter: Secondary | ICD-10-CM | POA: Insufficient documentation

## 2014-12-29 MED ORDER — IBUPROFEN 800 MG PO TABS: 800.0000 mg | ORAL_TABLET | Freq: Three times a day (TID) | ORAL | Status: DC

## 2014-12-29 MED ORDER — METHOCARBAMOL 500 MG PO TABS
1000.0000 mg | ORAL_TABLET | Freq: Once | ORAL | Status: AC
  Administered 2014-12-29: 1000 mg via ORAL
  Filled 2014-12-29: qty 2

## 2014-12-29 MED ORDER — TIZANIDINE HCL 4 MG PO TABS: 4.0000 mg | ORAL_TABLET | Freq: Four times a day (QID) | ORAL | Status: DC | PRN

## 2014-12-29 MED ORDER — IBUPROFEN 800 MG PO TABS
800.0000 mg | ORAL_TABLET | Freq: Once | ORAL | Status: AC
  Administered 2014-12-29: 800 mg via ORAL
  Filled 2014-12-29: qty 1

## 2014-12-29 MED ORDER — METHOCARBAMOL 500 MG PO TABS: 500.0000 mg | ORAL_TABLET | Freq: Two times a day (BID) | ORAL | Status: DC

## 2014-12-29 NOTE — ED Notes (Signed)
C/o bilat leg pain since Sunday-states she fell Sunday before last but did not have pain at that time-pt NAD-steady gait

## 2014-12-29 NOTE — Discharge Instructions (Signed)
1. Medications: robaxin, ibuprofen, usual home medications 2. Treatment: rest, drink plenty of fluids, gentle stretching as discussed, alternate ice and heat 3. Follow Up: Please followup with your primary doctor in 3 days for discussion of your diagnoses and further evaluation after today's visit; if you do not have a primary care doctor use the resource guide provided to find one;  Return to the ER for worsening back pain, difficulty walking, loss of bowel or bladder control or other concerning symptoms     Back Exercises The following exercises strengthen the muscles that help to support the back. They also help to keep the lower back flexible. Doing these exercises can help to prevent back pain or lessen existing pain. If you have back pain or discomfort, try doing these exercises 2-3 times each day or as told by your health care provider. When the pain goes away, do them once each day, but increase the number of times that you repeat the steps for each exercise (do more repetitions). If you do not have back pain or discomfort, do these exercises once each day or as told by your health care provider. EXERCISES Single Knee to Chest Repeat these steps 3-5 times for each leg: 1. Lie on your back on a firm bed or the floor with your legs extended. 2. Bring one knee to your chest. Your other leg should stay extended and in contact with the floor. 3. Hold your knee in place by grabbing your knee or thigh. 4. Pull on your knee until you feel a gentle stretch in your lower back. 5. Hold the stretch for 10-30 seconds. 6. Slowly release and straighten your leg. Pelvic Tilt Repeat these steps 5-10 times: 1. Lie on your back on a firm bed or the floor with your legs extended. 2. Bend your knees so they are pointing toward the ceiling and your feet are flat on the floor. 3. Tighten your lower abdominal muscles to press your lower back against the floor. This motion will tilt your pelvis so your  tailbone points up toward the ceiling instead of pointing to your feet or the floor. 4. With gentle tension and even breathing, hold this position for 5-10 seconds. Cat-Cow Repeat these steps until your lower back becomes more flexible: 1. Get into a hands-and-knees position on a firm surface. Keep your hands under your shoulders, and keep your knees under your hips. You may place padding under your knees for comfort. 2. Let your head hang down, and point your tailbone toward the floor so your lower back becomes rounded like the back of a cat. 3. Hold this position for 5 seconds. 4. Slowly lift your head and point your tailbone up toward the ceiling so your back forms a sagging arch like the back of a cow. 5. Hold this position for 5 seconds. Press-Ups Repeat these steps 5-10 times: 1. Lie on your abdomen (face-down) on the floor. 2. Place your palms near your head, about shoulder-width apart. 3. While you keep your back as relaxed as possible and keep your hips on the floor, slowly straighten your arms to raise the top half of your body and lift your shoulders. Do not use your back muscles to raise your upper torso. You may adjust the placement of your hands to make yourself more comfortable. 4. Hold this position for 5 seconds while you keep your back relaxed. 5. Slowly return to lying flat on the floor. Bridges Repeat these steps 10 times: 1. Lie on your  back on a firm surface. 2. Bend your knees so they are pointing toward the ceiling and your feet are flat on the floor. 3. Tighten your buttocks muscles and lift your buttocks off of the floor until your waist is at almost the same height as your knees. You should feel the muscles working in your buttocks and the back of your thighs. If you do not feel these muscles, slide your feet 1-2 inches farther away from your buttocks. 4. Hold this position for 3-5 seconds. 5. Slowly lower your hips to the starting position, and allow your buttocks  muscles to relax completely. If this exercise is too easy, try doing it with your arms crossed over your chest. Abdominal Crunches Repeat these steps 5-10 times: 1. Lie on your back on a firm bed or the floor with your legs extended. 2. Bend your knees so they are pointing toward the ceiling and your feet are flat on the floor. 3. Cross your arms over your chest. 4. Tip your chin slightly toward your chest without bending your neck. 5. Tighten your abdominal muscles and slowly raise your trunk (torso) high enough to lift your shoulder blades a tiny bit off of the floor. Avoid raising your torso higher than that, because it can put too much stress on your low back and it does not help to strengthen your abdominal muscles. 6. Slowly return to your starting position. Back Lifts Repeat these steps 5-10 times: 1. Lie on your abdomen (face-down) with your arms at your sides, and rest your forehead on the floor. 2. Tighten the muscles in your legs and your buttocks. 3. Slowly lift your chest off of the floor while you keep your hips pressed to the floor. Keep the back of your head in line with the curve in your back. Your eyes should be looking at the floor. 4. Hold this position for 3-5 seconds. 5. Slowly return to your starting position. SEEK MEDICAL CARE IF:  Your back pain or discomfort gets much worse when you do an exercise.  Your back pain or discomfort does not lessen within 2 hours after you exercise. If you have any of these problems, stop doing these exercises right away. Do not do them again unless your health care provider says that you can. SEEK IMMEDIATE MEDICAL CARE IF:  You develop sudden, severe back pain. If this happens, stop doing the exercises right away. Do not do them again unless your health care provider says that you can.   This information is not intended to replace advice given to you by your health care provider. Make sure you discuss any questions you have with your  health care provider.   Document Released: 03/15/2004 Document Revised: 10/27/2014 Document Reviewed: 04/01/2014 Elsevier Interactive Patient Education 2016 [employer].    Emergency Department Resource Guide 1) Find a Doctor and Pay Out of Pocket Although you won't have to find out who is covered by your insurance plan, it is a good idea to ask around and get recommendations. You will then need to call the office and see if the doctor you have chosen will accept you as a new patient and what types of options they offer for patients who are self-pay. Some doctors offer discounts or will set up payment plans for their patients who do not have insurance, but you will need to ask so you aren't surprised when you get to your appointment.  2) Contact Your Local Health Department Not all health departments have  doctors that can see patients for sick visits, but many do, so it is worth a call to see if yours does. If you don't know where your local health department is, you can check in your phone book. The CDC also has a tool to help you locate your state's health department, and many state websites also have listings of all of their local health departments.  3) Find a [facility] If your illness is not likely to be very severe or complicated, you may want to try a walk in clinic. These are popping up all over the country in pharmacies, drugstores, and shopping centers. They're usually staffed by nurse practitioners or physician assistants that have been trained to treat common illnesses and complaints. They're usually fairly quick and inexpensive. However, if you have serious medical issues or chronic medical problems, these are probably not your best option.  No Primary Care Doctor: - Call Health Connect at  [phone] - they can help you locate a primary care doctor that  accepts your insurance, provides certain services, etc. - Physician Referral Service- [phone]  Chronic Pain  Problems: Organization         Address  Phone   Notes  [facility]  [phone] Patients need to be referred by their primary care doctor.   Medication Assistance: Organization         Address  Phone   Notes  [facility] Medication [facility] [address] [place], [place] [address] [phone] --Must be a resident of [facility] [place] -- Must have NO insurance coverage whatsoever (no Medicaid/ Medicare, etc.) -- The pt. MUST have a primary care doctor that directs their care regularly and follows them in the community   Med[facility]ist  [phone]   [employer]  [phone]    Agencies that provide inexpensive medical care: Organization         Address  Phone   Notes  [place]  [phone]   [facility]    [phone]   [facility] [place], [place] [address] [phone]   [place] [address], [place] [phone]   Planned Parenthood    [phone]   [facility]    [phone]   [place] and [place] [address], [place] Phone:  [phone], Fax:  [phone] Hours of Operation:  9 am - 6 pm, M-F.  Also accepts Medicaid/Medicare and self-pay.  [facility] Meeting for [place], [address], [place] Phone: [phone], Fax: [phone]. Hours of Operation:  8:30 am - 5:30 pm, M-F.  Also accepts Medicaid and self-pay.  [facility] [address] [place] Phone: [phone]   [place], [place], [place] [phone], Ext. [phone] Mondays & Thursdays: 7-9 AM.  First 15 patients are seen on a first come, first serve basis.    [place] Providers:  Organization         Address  Phone   Notes  [facility] [address], [place] [phone] Also  accepts self-pay patients.  [place], [place]  [phone]   [place], [address], [place] [phone]   [facility] [address] [place], [place] [phone]   Lucianne Lei [address], [place]   [phone] Only accepts [place] Access [place] patients after they have their name applied to their card.   Self-Pay (no insurance) in [facility]:  Organization         Address  Phone   Notes  Sickle Cell Patients, [facility] [place] [phone]   [facility] [place] [phone]   [facility] [place]  [place], [place], [place] [phone]   [facility]/Dr. Osei-Bonsu  [address], [place] or [address], [place] [phone] Phone number for both [place] and [place] locations is the same.  [facility] [address], [place] [phone]   [facility] [address], [place] or [address] [phone]   [facility] [address] [place] [phone], phone; [phone], fax Sees patients 1st and 3rd Saturday of every month.  Must not qualify for public or private insurance (i.e. Medicaid, Medicare, [place] Health Choice, Veterans' Benefits)  Household income should be no more than 200% of the poverty level The clinic cannot treat you if you are pregnant or think you are pregnant  Sexually transmitted diseases are not treated at the clinic.    Dental Care: Organization         Address  Phone  Notes  [facility] Department of [facility] [place] [phone] Accepts children up to age 43 who are enrolled in [place] or [place]; pregnant  women with a Medicaid card; and children who have applied for Medicaid or [place] Health Choice, but were declined, whose parents can pay a reduced fee at time of service.  [facility] Department of [facility]  [address], [place] [phone] Accepts children up to age 17 who are enrolled in [place] or [place]; pregnant women with a Medicaid card; and children who have applied for Medicaid or [place] Health Choice, but were declined, whose parents can pay a reduced fee at time of service.  [place] Adult [facility] Access PROGRAM  [place] [phone] Patients are seen by appointment only. Walk-ins are not accepted. [place] will see patients 4 years of age and older. Monday - Tuesday (8am-5pm) Most Wednesdays (8:30-5pm) $30 per visit, cash only  [facility] Adult [facility] Access PRO[facility]  [address], [facility] [phone] Patients are seen by appointment only. Walk-ins are not accepted. [place] will see patients 39 years of age and older. One Wednesday Evening (Monthly: Volunteer Based).  $30 per visit, cash only  [place]  [phone] for adults; Children under age 90, call Graduate Pediatric Dentistry at [phone]. Children aged 25-14, please call [phone] to request a pediatric application.  Dental services are provided in all areas of dental care including fillings, crowns and bridges, complete and partial dentures, implants, gum treatment, root canals, and extractions. Preventive care is also provided. Treatment is provided to both adults and children. Patients are selected via a lottery and there is often a waiting list.   [facility] [address], [place]  [phone] [email or web address]  [address] [place], [place] [phone], Ext. [phone] Second and Fourth Thursday of each month, opens at 6:30 AM; Clinic ends at 9 AM.  Patients are  seen on a first-come first-served basis, and a limited number are seen during each clinic.   [facility]  [address] Hillard Danker [place], [place] [phone]   Eligibility Requirements You must have lived in [place], [place], or [place] counties for at least the last three months.   You cannot be eligible for state or federal sponsored [employer], including [employer], [place], or [employer].   You generally cannot be eligible for healthcare insurance through your employer.    How to apply: Eligibility screenings are held every Tuesday and Wednesday afternoon from 1:00 pm until 4:00 pm. You do not need an appointment for the interview!  [facility] [address], [place], [place]   [place]  [place] Department  [place]  [phone]    Behavioral Health Resources in the Community: Intensive Outpatient Programs Organization         Address  Phone  Notes  [place]. [address], [place], [place] [phone]   [facility] [address], [place], [place]   [facility]: [facility] [address] [place], [place]   [place] [address]  [place], [place] or [phone]   Substance Abuse Resources Organization         Address  Phone  Notes  Alcohol and Drug Services  [phone]   [place]  [phone]   [facility] [place]   Chinita Pester  [phone]   Residential & Outpatient Substance Abuse Program  [phone]   Psychological Services Organization         Address  Phone  Notes  [facility] [place]  [place]  [phone]   [place] [address] [place] or [phone]    Mobile Crisis  Teams Organization         Address  Phone  Notes  [facility], Mobile Crisis Care Unit  [phone]   [facility]  [address] [place], [place]   [place] [address] [place] [phone]    Self-Help/Support Groups Organization         Address  Phone             Notes  [place]. of [place] - variety of support groups  [place] Call for more information  Narcotics Anonymous (NA), [facility] [address], [employer] [place]  2 meetings at this location   Special educational needs teacher         Address  Phone  Notes  ASAP Residential Treatment [place],    [place]  [phone]   [facility]  [address], [place] [address], [place], [place]   [place], [place] [phone] Admissions: 8am-3pm M-F  Incentives Substance [place] [address],    [place], [place] [phone]   [facility] [address] Jadene Pierini [place], [place]   [facility] [address],  [place], [place] - Intensive Outpatient [address] Kristeen Mans [place], [place]  [phone]   [facility] [place].,  [place], [place] [phone] or [phone]   [facility] Services (RTS) [address], [place], [place] Accepts Medicaid  [facility] [place] [address]  [place] [phone] Substance Abuse/Addiction Treatment   [facility] Organization         Address  Phone  Notes  [facility]  [phone]   Domenic Schwab, PhD [address] Arlis Porta [place], [place]   [phone] or [phone]   [place], [place] [phone], [place], [place] [phone]  Insurance/Medicaid/sponsorship through [facility] and Families [address] [place]                                    [place], [place] [phone] [place] [address], [place] [phone]    Dr. Adele Schilder  [phone]   [facility] Dept. 1) [address] [place] 2) [place] 3)  [address], [place] [phone]  [phone]   [place] [phone] or [phone] (After Hours)

## 2014-12-29 NOTE — ED Provider Notes (Signed)
CSN: [id number]     Arrival date & time 12/29/14  1136 History   First MD Initiated Contact with Patient 12/29/14 1241     Chief Complaint  Patient presents with  . Leg Pain     (Consider location/radiation/quality/duration/timing/severity/associated sxs/prior Treatment) The history is provided by the patient and medical records. No language interpreter was used.     Ellen [name] is a 54 y.o. female  with a hx of Depression, GERD, HTN, DJD, obesity presents to the Emergency Department complaining of gradual, persistent, progressively worsening right leg pain, worse in the right knee onset 2 weeks ago.  Pt reports she got up from the bed and almost immediately fell.  Question syncope as pt cannot remember the fall and reports "waking up on the floor."   She denies previous episodes of syncope or recurrent syncope since that time.  Pt reports her right hip hurt at the time but she has had increasing pain in the right leg at that time.  Pt reports she was able to get up and walk without difficulty after the episode and has walked without difficulty since that time.  Denies swelling, deformity, and this, tingling, weakness, loss of bowel or bladder control, gait disturbance, saddle anesthesia.  Patient reports taking home medication of Celexa and gabapentin without relief but has not tried any Tylenol, ibuprofen normal Celexa.  Pt denies chest pain, shortness of breath, abdominal pain, nausea, vomiting, diarrhea, weakness, dizziness, headaches, neck pain.     Past Medical History  Diagnosis Date  . Depression   . GERD (gastroesophageal reflux disease)   . Hypertension   . Lipoma   . Degenerative joint disease   . Bronchitis     hx of  . Obesity   . Hernia   . ENDOMETRIAL POLYP 01/08/2006    Dr. Agnes Lawrence.  2001:  D&C, benign;  07/14/10:  D&C, hysteroscopy, polypectomy and bx of mons pubis (peau d'orange)...... 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   Past  Surgical History  Procedure Laterality Date  . Cholecystectomy    . Resection of lipoma    . Umbilical hernia repair     No family history on file. Social History  Substance Use Topics  . Smoking status: Never Smoker   . Smokeless tobacco: None  . Alcohol Use: No   OB History    No data available     Review of Systems  Constitutional: Negative for fever, diaphoresis, appetite change, fatigue and unexpected weight change.  HENT: Negative for mouth sores.   Eyes: Negative for visual disturbance.  Respiratory: Negative for cough, chest tightness, shortness of breath and wheezing.   Cardiovascular: Negative for chest pain.  Gastrointestinal: Negative for nausea, vomiting, abdominal pain, diarrhea and constipation.  Endocrine: Negative for polydipsia, polyphagia and polyuria.  Genitourinary: Negative for dysuria, urgency, frequency and hematuria.  Musculoskeletal: Positive for myalgias (right leg), back pain (right sided, low) and arthralgias (right hip, right knee). Negative for neck stiffness.  Skin: Negative for rash.  Allergic/Immunologic: Negative for immunocompromised state.  Neurological: Positive for syncope (2 weeks ago). Negative for light-headedness and headaches.  Hematological: Does not bruise/bleed easily.  Psychiatric/Behavioral: Negative for sleep disturbance. The patient is not nervous/anxious.       Allergies  Codeine  Home Medications   Prior to Admission medications   Medication Sig Start Date End Date Taking? Authorizing Provider  aspirin (BL ADULT ASPIRIN LOW STRENGTH) 81 MG chewable tablet Chew 81 mg by mouth daily.  Historical Provider, MD  cetirizine (ZYRTEC) 10 MG tablet TAKE 1 TABLET BY MOUTH ONCE A DAY 07/01/10   Alphia Moh, MD  citalopram (CELEXA) 20 MG tablet  06/04/12   Historical Provider, MD  enalapril (VASOTEC) 20 MG tablet Take 20 mg by mouth daily. Take tablets by mouth once a day     Historical Provider, MD  escitalopram (LEXAPRO)  20 MG tablet Take 1 tablet (20 mg total) by mouth daily. 01/03/11 04/03/11  Hester Mates, MD  ESCITALOPRAM OXALATE PO Take by mouth.    Historical Provider, MD  Gabapentin (NEURONTIN PO) Take by mouth.    Historical Provider, MD  hydrochlorothiazide 25 MG tablet Take 25 mg by mouth daily.      Historical Provider, MD  ibuprofen (ADVIL,MOTRIN) 800 MG tablet Take 1 tablet (800 mg total) by mouth 3 (three) times daily. 12/29/14   Janashia Parco, PA-C  omeprazole (PRILOSEC) 20 MG capsule Take 1 capsule (20 mg total) by mouth 2 (two) times daily. 10/14/10   Oval Linsey, MD  simvastatin (ZOCOR) 40 MG tablet Take 1 tablet (40 mg total) by mouth every evening. 03/29/10 03/29/11  Alphia Moh, MD  simvastatin (ZOCOR) 40 MG tablet Take 40 mg by mouth every evening.    Historical Provider, MD  solifenacin (VESICARE) 10 MG tablet Take by mouth daily.    Historical Provider, MD  sulfamethoxazole-trimethoprim (BACTRIM DS) 800-160 MG per tablet Take 1 tablet by mouth 2 (two) times daily. 12/25/12   Lahoma Crocker, MD  tiZANidine (ZANAFLEX) 4 MG tablet Take 1 tablet (4 mg total) by mouth every 6 (six) hours as needed for muscle spasms. 12/29/14   Charday Capetillo, PA-C  traMADol (ULTRAM) 50 MG tablet Take 1 tablet (50 mg total) by mouth every 6 (six) hours as needed for pain. 11/15/10   Hester Mates, MD  tranexamic acid (LYSTEDA) 650 MG TABS Take 2 tablets (1,300 mg total) by mouth 3 (three) times daily. Take for 5 days 08/29/12   Lahoma Crocker, MD   BP 125/68 mmHg  Pulse 75  Temp(Src) 98.4 F (36.9 C) (Oral)  Resp 18  Ht 5\' 5"  (1.651 m)  Wt 370 lb (167.831 kg)  BMI 61.57 kg/m2  SpO2 96%  LMP 07/11/2014 Physical Exam  Constitutional: She appears well-developed and well-nourished. No distress.  obese  HENT:  Head: Normocephalic and atraumatic.  Mouth/Throat: Oropharynx is clear and moist. No oropharyngeal exudate.  Eyes: Conjunctivae are normal.  Neck: Normal range of motion. Neck supple.   Full ROM without pain  Cardiovascular: Normal rate, regular rhythm, normal heart sounds and intact distal pulses.   No murmur heard. Pulmonary/Chest: Effort normal and breath sounds normal. No respiratory distress. She has no wheezes.  Abdominal: Soft. She exhibits no distension. There is no tenderness.  Musculoskeletal:  Full range of motion of the T-spine and L-spine No tenderness to palpation of the spinous processes of the T-spine or L-spine Tenderness to palpation of the right paraspinous muscles of the L-spine and over the SI joint FROM of the right knee with moderate pain; TTP of the lateral and medial joint line; no abnormal patellar movement; no palpable defect in the patellar tendon and normal straight leg raise FROM of the right ankle without difficulty or pain Almost full ROM of the right hip with mild pain; TTP of the later right hip Exam is limited by body habitus  Lymphadenopathy:    She has no cervical adenopathy.  Neurological: She is alert. She has normal reflexes.  Reflex Scores:      Bicep reflexes are 2+ on the right side and 2+ on the left side.      Brachioradialis reflexes are 2+ on the right side and 2+ on the left side.      Patellar reflexes are 2+ on the right side and 2+ on the left side.      Achilles reflexes are 2+ on the right side and 2+ on the left side. Speech is clear and goal oriented, follows commands Normal 5/5 strength in upper and lower extremities bilaterally including dorsiflexion and plantar flexion, strong and equal grip strength Sensation normal to light and sharp touch Moves extremities without ataxia, coordination intact Normal gait Normal balance No Clonus   Skin: Skin is warm and dry. No rash noted. She is not diaphoretic. No erythema.  Psychiatric: She has a normal mood and affect. Her behavior is normal.  Nursing note and vitals reviewed.   ED Course  Procedures (including critical care time) Labs Review Labs Reviewed - No  data to display  Imaging Review Dg Lumbar Spine Complete  12/29/2014  CLINICAL DATA:  Fall 3 days prior. RIGHT-sided pain. Low back pain and RIGHT hip pain. EXAM: LUMBAR SPINE - COMPLETE 4+ VIEW COMPARISON:  None. FINDINGS: No acute loss of lumbar vertebral body height. There is spurring of the endplates at K9-3. No subluxation IMPRESSION: No fracture.  Multilevel disc osteophytic disease new Electronically Signed   By: Suzy Bouchard M.D.   On: 12/29/2014 13:39   Dg Knee Complete 4 Views Right  12/29/2014  CLINICAL DATA:  Recent fall with right knee pain, initial encounter EXAM: RIGHT KNEE - COMPLETE 4+ VIEW COMPARISON:  09/13/2009 FINDINGS: Degenerative changes are noted with narrowing of all 3 joint spaces. The degree of osteophytic change has progressed slightly in the interval from the prior exam. No joint effusion is seen. No acute soft tissue abnormality is noted. No fracture is noted. IMPRESSION: Advanced degenerative change increased from the prior exam. Electronically Signed   By: Inez Catalina M.D.   On: 12/29/2014 13:41   Dg Hip Unilat With Pelvis 2-3 Views Right  12/29/2014  CLINICAL DATA:  Fall in bathroom.  , RIGHT hip pain. EXAM: DG HIP (WITH OR WITHOUT PELVIS) 2-3V RIGHT COMPARISON:  None. FINDINGS: Hips are located. No pelvic fracture or sacral fracture. Dedicated view of the RIGHT hip demonstrates no femoral neck fracture. IMPRESSION: No pelvic fracture or RIGHT hip fracture. Electronically Signed   By: Suzy Bouchard M.D.   On: 12/29/2014 13:41   I have personally reviewed and evaluated these images and lab results as part of my medical decision-making.   EKG Interpretation   Date/Time:  Wednesday December 29 2014 15:06:00 EST Ventricular Rate:  73 PR Interval:  196 QRS Duration: 90 QT Interval:  372 QTC Calculation: 409 R Axis:   50 Text Interpretation:  Normal sinus rhythm Low voltage QRS Borderline ECG  No significant change since last tracing Confirmed by LIU MD,  Hinton Dyer ([id number])  on 12/29/2014 4:16:34 PM        MDM   Final diagnoses:  Syncope and collapse  Right-sided low back pain without sciatica  Right knee pain   Elinor Parkinson presents with complaints of right leg pain persistent for 2 weeks after syncopal episode.  Patient with normal neurologic exam today and no further syncopal episodes. No chest pain or shortness of breath.  Patient's EKG is nonischemic and without acute abnormalities.  No evidence of orthostasis.  Orthostatic VS for the past 24 hrs:  BP- Lying Pulse- Lying BP- Sitting Pulse- Sitting BP- Standing at 0 minutes Pulse- Standing at 0 minutes  12/29/14 1448 121/76 mmHg 76 130/86 mmHg 93 133/90 mmHg 97    X-rays are without acute abnormality. Worsening degenerative changes to patient's low back and right knee likely the cause of her pain.  Pain treated here in the emergency department with ibuprofen and Robaxin. Patient reports she feels much better. She ambulates here in the emergency room without assistance.  No near-syncope with ambulation.  We'll discharge home with conservative therapies for her pain.  No evidence of cauda equina. Patient denies red flags such as IV drug use, history of cancer or anticoagulant usage. Patient is to follow-up with her primary care physician for further evaluation of her syncopal episode. She is to return to the emergency department if symptoms return.  BP 125/68 mmHg  Pulse 75  Temp(Src) 98.4 F (36.9 C) (Oral)  Resp 18  Ht 5\' 5"  (1.651 m)  Wt 370 lb (167.831 kg)  BMI 61.57 kg/m2  SpO2 96%  LMP 07/11/2014   Abigail Butts, PA-C 12/29/14 1912  Forde Dandy, MD 12/29/14 2041

## 2015-03-29 ENCOUNTER — Encounter: Payer: Self-pay | Admitting: Family Medicine

## 2015-03-29 ENCOUNTER — Ambulatory Visit (INDEPENDENT_AMBULATORY_CARE_PROVIDER_SITE_OTHER): Payer: Medicare Other | Admitting: Family Medicine

## 2015-03-29 VITALS — BP 125/81 | HR 89 | Ht 67.0 in | Wt 370.0 lb

## 2015-03-29 DIAGNOSIS — M170 Bilateral primary osteoarthritis of knee: Secondary | ICD-10-CM

## 2015-03-29 DIAGNOSIS — M129 Arthropathy, unspecified: Secondary | ICD-10-CM | POA: Diagnosis not present

## 2015-03-29 DIAGNOSIS — M25561 Pain in right knee: Secondary | ICD-10-CM | POA: Diagnosis not present

## 2015-03-29 DIAGNOSIS — M25562 Pain in left knee: Secondary | ICD-10-CM | POA: Diagnosis not present

## 2015-03-29 DIAGNOSIS — M542 Cervicalgia: Secondary | ICD-10-CM

## 2015-03-29 MED ORDER — CYCLOBENZAPRINE HCL 10 MG PO TABS
10.0000 mg | ORAL_TABLET | Freq: Three times a day (TID) | ORAL | Status: DC | PRN
Start: 2015-03-29 — End: 2016-02-13

## 2015-03-29 MED ORDER — METHYLPREDNISOLONE ACETATE 40 MG/ML IJ SUSP
40.0000 mg | Freq: Once | INTRAMUSCULAR | Status: AC
  Administered 2015-03-29: 40 mg via INTRA_ARTICULAR

## 2015-03-29 NOTE — Patient Instructions (Signed)
Your knee pain and neck pain are due to arthritis. Start physical therapy for your neck pain. These are the 4 different classes of medicine you can take for this: Tylenol 500mg  1-2 tabs three times a day for pain. Aleve 1-2 tabs twice a day with food Glucosamine sulfate 750mg  twice a day is a supplement that may help. Capsaicin, aspercreme, or biofreeze topically up to four times a day may also help with pain. Cortisone injections are an option - you were given these today. If cortisone injections do not help, there are different types of shots that may help but they take longer to take effect. It's important that you continue to stay active. Straight leg raises, knee extensions 3 sets of 10 once a day (add ankle weight if these become too easy). Consider physical therapy to strengthen muscles around the joint that hurts to take pressure off of the joint itself. Shoe inserts with good arch support may be helpful. Walker or cane if needed. Heat or ice 15 minutes at a time 3-4 times a day as needed to help with pain. Water aerobics and cycling with low resistance are the best two types of exercise for arthritis. Follow up with me in 1 month for reevaluation.

## 2015-03-30 ENCOUNTER — Telehealth: Payer: Self-pay | Admitting: Family Medicine

## 2015-03-30 NOTE — Telephone Encounter (Signed)
We don't provide permanent placards but if she wants one for 3 months we can do that - she would have to see a PCP for a permanent one.

## 2015-03-31 DIAGNOSIS — M542 Cervicalgia: Secondary | ICD-10-CM | POA: Insufficient documentation

## 2015-03-31 NOTE — Addendum Note (Signed)
Addended by: Dene Gentry on: 03/31/2015 01:13 PM   Modules accepted: Miquel Dunn

## 2015-03-31 NOTE — Progress Notes (Signed)
PCP: No primary care provider on file.  Subjective:   HPI: Patient is a 55 y.o. female here for bilateral knee pain.  Patient reports she has had over 3-4 years of bilateral anterior knee pain. Pain up to 10/10 level, sharp. Worse left knee than the right knee. Associated popping. Remotely had cortisone injections at [facility] which provided some benefit. + swelling. No skin changes, fever, other complaints. She also reported some midline deep neck pain posteriorly without radiation - has history of DDD. Is being referred to pain management as well.  Past Medical History  Diagnosis Date  . Depression   . GERD (gastroesophageal reflux disease)   . Hypertension   . Lipoma   . Degenerative joint disease   . Bronchitis     hx of  . Obesity   . Hernia   . ENDOMETRIAL POLYP 01/08/2006    Dr. Agnes Lawrence.  2001:  D&C, benign;  07/14/10:  D&C, hysteroscopy, polypectomy and bx of mons pubis (peau d'orange)...... 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    Current Outpatient Prescriptions on File Prior to Visit  Medication Sig Dispense Refill  . aspirin (BL ADULT ASPIRIN LOW STRENGTH) 81 MG chewable tablet Chew 81 mg by mouth daily.      . cetirizine (ZYRTEC) 10 MG tablet TAKE 1 TABLET BY MOUTH ONCE A DAY 90 tablet 2  . citalopram (CELEXA) 20 MG tablet     . enalapril (VASOTEC) 20 MG tablet Take 20 mg by mouth daily. Take tablets by mouth once a day     . hydrochlorothiazide 25 MG tablet Take 25 mg by mouth daily.      . tranexamic acid (LYSTEDA) 650 MG TABS Take 2 tablets (1,300 mg total) by mouth 3 (three) times daily. Take for 5 days 15 tablet 1  . [DISCONTINUED] fluticasone (FLONASE) 50 MCG/ACT nasal spray Place 2 sprays into the nose daily. Apply 2 sprays in each nostril at bedtime      No current facility-administered medications on file prior to visit.    Past Surgical History  Procedure Laterality Date  . Cholecystectomy    . Resection of lipoma     . Umbilical hernia repair      Allergies  Allergen Reactions  . Codeine Itching    Only happened one time. Did not occur last time patient was in the hospital.    Social History   Social History  . Marital Status: Single    Spouse Name: N/A  . Number of Children: N/A  . Years of Education: N/A   Occupational History  . Not on file.   Social History Main Topics  . Smoking status: Never Smoker   . Smokeless tobacco: Not on file  . Alcohol Use: No  . Drug Use: No  . Sexual Activity: Not on file   Other Topics Concern  . Not on file   Social History Narrative   Occupation: unemployed   single    No family history on file.  BP 125/81 mmHg  Pulse 89  Ht 5\' 7"  (1.702 m)  Wt 370 lb (167.831 kg)  BMI 57.94 kg/m2  LMP 07/11/2014  Review of Systems: See HPI above.    Objective:  Physical Exam:  Gen: NAD, comfortable in exam room.  Bilateral knees: Soft tissue swelling.  No obvious effusion.  No gross deformity, ecchymoses. TTP medial and lateral joint lines > post patellar facets.  No other tenderness. FROM. Negative ant/post drawers. Negative valgus/varus testing. Negative  lachmanns. Negative mcmurrays, apleys, patellar apprehension. NV intact distally.    Assessment & Plan:  1. Bilateral knee pain - independently reviewed radiographs from 12/29/2014 - advanced arthritis of the right knee (radiographs of left knee from 2011 showed same).  Discussed tylenol, nsaids, glucosamine, topical medications.  Given intraarticular injections today.  Consider viscosupplementation, physical therapy.  Heat/ice.  F/u in 1 month.  After informed written consent, patient was seated on exam table. Right knee was prepped with alcohol swab and utilizing anteromedial approach, patient's right knee was injected intraarticularly with 3:1 marcaine: depomedrol. Patient tolerated the procedure well without immediate complications.  After informed written consent, patient was seated on  exam table. Left knee was prepped with alcohol swab and utilizing anteromedial approach, patient's left knee was injected intraarticularly with 3:1 marcaine: depomedrol. Patient tolerated the procedure well without immediate complications.  2. Neck pain - being referred to pain management.  She would like to start physical therapy so we have placed an order for this as well.

## 2015-03-31 NOTE — Assessment & Plan Note (Signed)
independently reviewed radiographs from 12/29/2014 - advanced arthritis of the right knee (radiographs of left knee from 2011 showed same).  Discussed tylenol, nsaids, glucosamine, topical medications.  Given intraarticular injections today.  Consider viscosupplementation, physical therapy.  Heat/ice.  F/u in 1 month.  After informed written consent, patient was seated on exam table. Right knee was prepped with alcohol swab and utilizing anteromedial approach, patient's right knee was injected intraarticularly with 3:1 marcaine: depomedrol. Patient tolerated the procedure well without immediate complications.  After informed written consent, patient was seated on exam table. Left knee was prepped with alcohol swab and utilizing anteromedial approach, patient's left knee was injected intraarticularly with 3:1 marcaine: depomedrol. Patient tolerated the procedure well without immediate complications.

## 2015-03-31 NOTE — Assessment & Plan Note (Signed)
being referred to pain management.  She would like to start physical therapy so we have placed an order for this as well.

## 2015-04-14 DIAGNOSIS — R42 Dizziness and giddiness: Secondary | ICD-10-CM | POA: Insufficient documentation

## 2015-04-14 DIAGNOSIS — R3915 Urgency of urination: Secondary | ICD-10-CM | POA: Insufficient documentation

## 2015-04-27 ENCOUNTER — Ambulatory Visit (INDEPENDENT_AMBULATORY_CARE_PROVIDER_SITE_OTHER): Payer: Medicare Other | Admitting: Family Medicine

## 2015-04-27 ENCOUNTER — Encounter (INDEPENDENT_AMBULATORY_CARE_PROVIDER_SITE_OTHER): Payer: Self-pay

## 2015-04-27 ENCOUNTER — Encounter: Payer: Self-pay | Admitting: Family Medicine

## 2015-04-27 VITALS — BP 129/87 | HR 87 | Ht 67.0 in | Wt 360.0 lb

## 2015-04-27 DIAGNOSIS — M129 Arthropathy, unspecified: Secondary | ICD-10-CM | POA: Diagnosis not present

## 2015-04-27 DIAGNOSIS — M170 Bilateral primary osteoarthritis of knee: Secondary | ICD-10-CM

## 2015-04-27 NOTE — Patient Instructions (Signed)
Your knee pain is due to arthritis. These are the 4 different classes of medicine you can take for this: Tylenol 500mg  1-2 tabs three times a day for pain. Aleve 1-2 tabs twice a day with food Glucosamine sulfate 750mg  twice a day is a supplement that may help. Capsaicin, aspercreme, or biofreeze topically up to four times a day may also help with pain. You can try the fish oil tablets as well. We will get approval for the gel shots and call you to start this series of 3 shots. It's important that you continue to stay active. Straight leg raises, knee extensions 3 sets of 10 once a day (add ankle weight if these become too easy). Consider physical therapy to strengthen muscles around the joint that hurts to take pressure off of the joint itself. Shoe inserts with good arch support may be helpful. Walker or cane if needed. Heat or ice 15 minutes at a time 3-4 times a day as needed to help with pain. Water aerobics and cycling with low resistance are the best two types of exercise for arthritis.

## 2015-04-28 NOTE — Progress Notes (Signed)
PCP: No primary care provider on file.  Subjective:   HPI: Patient is a 55 y.o. female here for bilateral knee pain.  2/7: Patient reports she has had over 3-4 years of bilateral anterior knee pain. Pain up to 10/10 level, sharp. Worse left knee than the right knee. Associated popping. Remotely had cortisone injections at [facility] which provided some benefit. + swelling. No skin changes, fever, other complaints. She also reported some midline deep neck pain posteriorly without radiation - has history of DDD. Is being referred to pain management as well.  3/8: Patient reports injections only helped for a few days. Pain in both knees 9/10 now, sharp, anterior. Had not heard from physical therapy. Taking tylenol at bedtime. No skin changes, fever, other complaints.  Past Medical History  Diagnosis Date  . Depression   . GERD (gastroesophageal reflux disease)   . Hypertension   . Lipoma   . Degenerative joint disease   . Bronchitis     hx of  . Obesity   . Hernia   . ENDOMETRIAL POLYP 01/08/2006    Dr. Agnes Lawrence.  2001:  D&C, benign;  07/14/10:  D&C, hysteroscopy, polypectomy and bx of mons pubis (peau d'orange)...... 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    Current Outpatient Prescriptions on File Prior to Visit  Medication Sig Dispense Refill  . albuterol (PROAIR HFA) 108 (90 Base) MCG/ACT inhaler     . aspirin (BL ADULT ASPIRIN LOW STRENGTH) 81 MG chewable tablet Chew 81 mg by mouth daily.      . cetirizine (ZYRTEC) 10 MG tablet TAKE 1 TABLET BY MOUTH ONCE A DAY 90 tablet 2  . ciclopirox (LOPROX) 0.77 % cream Apply topically.    . citalopram (CELEXA) 20 MG tablet     . cyclobenzaprine (FLEXERIL) 10 MG tablet Take 1 tablet (10 mg total) by mouth every 8 (eight) hours as needed. 60 tablet 1  . enalapril (VASOTEC) 20 MG tablet Take 20 mg by mouth daily. Take tablets by mouth once a day     . escitalopram (LEXAPRO) 20 MG tablet Take 20 mg by  mouth.    . gabapentin (NEURONTIN) 300 MG capsule Take 300 mg by mouth.    . hydrochlorothiazide 25 MG tablet Take 25 mg by mouth daily.      Marland Kitchen omeprazole (PRILOSEC) 40 MG capsule Take 40 mg by mouth.    . simvastatin (ZOCOR) 40 MG tablet Take 40 mg by mouth.    . solifenacin (VESICARE) 10 MG tablet Take 10 mg by mouth.    . tranexamic acid (LYSTEDA) 650 MG TABS Take 2 tablets (1,300 mg total) by mouth 3 (three) times daily. Take for 5 days 15 tablet 1  . [DISCONTINUED] fluticasone (FLONASE) 50 MCG/ACT nasal spray Place 2 sprays into the nose daily. Apply 2 sprays in each nostril at bedtime      No current facility-administered medications on file prior to visit.    Past Surgical History  Procedure Laterality Date  . Cholecystectomy    . Resection of lipoma    . Umbilical hernia repair      Allergies  Allergen Reactions  . Codeine Itching    Only happened one time. Did not occur last time patient was in the hospital.    Social History   Social History  . Marital Status: Single    Spouse Name: N/A  . Number of Children: N/A  . Years of Education: N/A   Occupational History  .  Not on file.   Social History Main Topics  . Smoking status: Never Smoker   . Smokeless tobacco: Not on file  . Alcohol Use: No  . Drug Use: No  . Sexual Activity: Not on file   Other Topics Concern  . Not on file   Social History Narrative   Occupation: unemployed   single    No family history on file.  BP 129/87 mmHg  Pulse 87  Ht 5\' 7"  (1.702 m)  Wt 360 lb (163.295 kg)  BMI 56.37 kg/m2  LMP 07/11/2014  Review of Systems: See HPI above.    Objective:  Physical Exam:  Gen: NAD, comfortable in exam room.  Bilateral knees: Soft tissue swelling.  No obvious effusion.  No gross deformity, ecchymoses. TTP medial and lateral joint lines > post patellar facets.  No other tenderness. FROM. Negative ant/post drawers. Negative valgus/varus testing. Negative lachmanns. Negative  mcmurrays, apleys, patellar apprehension. NV intact distally.    Assessment & Plan:  1. Bilateral knee pain - independently reviewed radiographs from 12/29/2014 - advanced arthritis of the right knee (radiographs of left knee from 2011 showed same).  Again discussed tylenol, nsaids, glucosamine, topical medications.  Minimal benefit with intraarticular cortisone injections.  Will get approval for supartz series.  Sent referral for physical therapy again.

## 2015-04-28 NOTE — Assessment & Plan Note (Signed)
independently reviewed radiographs from 12/29/2014 - advanced arthritis of the right knee (radiographs of left knee from 2011 showed same).  Again discussed tylenol, nsaids, glucosamine, topical medications.  Minimal benefit with intraarticular cortisone injections.  Will get approval for supartz series.  Sent referral for physical therapy again.

## 2015-05-07 ENCOUNTER — Emergency Department (HOSPITAL_BASED_OUTPATIENT_CLINIC_OR_DEPARTMENT_OTHER)
Admission: EM | Admit: 2015-05-07 | Discharge: 2015-05-07 | Disposition: A | Discharge status: Home / Self Care | Payer: Medicare Other | Attending: Emergency Medicine | Admitting: Emergency Medicine

## 2015-05-07 ENCOUNTER — Encounter (HOSPITAL_BASED_OUTPATIENT_CLINIC_OR_DEPARTMENT_OTHER): Payer: Self-pay

## 2015-05-07 DIAGNOSIS — J069 Acute upper respiratory infection, unspecified: Secondary | ICD-10-CM | POA: Diagnosis not present

## 2015-05-07 DIAGNOSIS — Z7982 Long term (current) use of aspirin: Secondary | ICD-10-CM | POA: Insufficient documentation

## 2015-05-07 DIAGNOSIS — R05 Cough: Secondary | ICD-10-CM | POA: Diagnosis present

## 2015-05-07 DIAGNOSIS — I10 Essential (primary) hypertension: Secondary | ICD-10-CM | POA: Insufficient documentation

## 2015-05-07 DIAGNOSIS — K219 Gastro-esophageal reflux disease without esophagitis: Secondary | ICD-10-CM | POA: Diagnosis not present

## 2015-05-07 DIAGNOSIS — Z86018 Personal history of other benign neoplasm: Secondary | ICD-10-CM | POA: Insufficient documentation

## 2015-05-07 DIAGNOSIS — Z79899 Other long term (current) drug therapy: Secondary | ICD-10-CM | POA: Diagnosis not present

## 2015-05-07 DIAGNOSIS — F329 Major depressive disorder, single episode, unspecified: Secondary | ICD-10-CM | POA: Insufficient documentation

## 2015-05-07 DIAGNOSIS — E669 Obesity, unspecified: Secondary | ICD-10-CM | POA: Insufficient documentation

## 2015-05-07 DIAGNOSIS — Z8742 Personal history of other diseases of the female genital tract: Secondary | ICD-10-CM | POA: Insufficient documentation

## 2015-05-07 MED ORDER — GI COCKTAIL ~~LOC~~
30.0000 mL | Freq: Once | ORAL | Status: AC
  Administered 2015-05-07: 30 mL via ORAL
  Filled 2015-05-07: qty 30

## 2015-05-07 MED ORDER — IBUPROFEN 600 MG PO TABS: 600.0000 mg | ORAL_TABLET | Freq: Four times a day (QID) | ORAL | Status: DC | PRN

## 2015-05-07 NOTE — ED Provider Notes (Signed)
CSN: [id number]     Arrival date & time 05/07/15  0221 History   First MD Initiated Contact with Patient 05/07/15 0239     Chief Complaint  Patient presents with  . URI     (Consider location/radiation/quality/duration/timing/severity/associated sxs/prior Treatment) Patient is a 55 y.o. female presenting with URI. The history is provided by the patient.  URI Presenting symptoms: congestion, cough and sore throat   Presenting symptoms: no fever   Severity:  Moderate Onset quality:  Gradual Timing:  Constant Progression:  Unchanged Chronicity:  New Relieved by:  Nothing Worsened by:  Nothing tried Ineffective treatments:  None tried Associated symptoms: no swollen glands and no wheezing   Risk factors: not elderly     Past Medical History  Diagnosis Date  . Depression   . GERD (gastroesophageal reflux disease)   . Hypertension   . Lipoma   . Degenerative joint disease   . Bronchitis     hx of  . Obesity   . Hernia   . ENDOMETRIAL POLYP 01/08/2006    Dr. Agnes Lawrence.  2001:  D&C, benign;  07/14/10:  D&C, hysteroscopy, polypectomy and bx of mons pubis (peau d'orange)...... 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   Past Surgical History  Procedure Laterality Date  . Cholecystectomy    . Resection of lipoma    . Umbilical hernia repair     History reviewed. No pertinent family history. Social History  Substance Use Topics  . Smoking status: Never Smoker   . Smokeless tobacco: None  . Alcohol Use: No   OB History    No data available     Review of Systems  Constitutional: Negative for fever.  HENT: Positive for congestion and sore throat.   Respiratory: Positive for cough. Negative for wheezing.   All other systems reviewed and are negative.     Allergies  Codeine  Home Medications   Prior to Admission medications   Medication Sig Start Date End Date Taking? Authorizing Provider  albuterol (PROAIR HFA) 108 (90 Base)  MCG/ACT inhaler  12/31/14   Historical Provider, MD  aspirin (BL ADULT ASPIRIN LOW STRENGTH) 81 MG chewable tablet Chew 81 mg by mouth daily.      Historical Provider, MD  cetirizine (ZYRTEC) 10 MG tablet TAKE 1 TABLET BY MOUTH ONCE A DAY 07/01/10   Alphia Moh, MD  ciclopirox (LOPROX) 0.77 % cream Apply topically. 03/09/15   Historical Provider, MD  citalopram (CELEXA) 20 MG tablet  06/04/12   Historical Provider, MD  cyclobenzaprine (FLEXERIL) 10 MG tablet Take 1 tablet (10 mg total) by mouth every 8 (eight) hours as needed. 03/29/15   Dene Gentry, MD  enalapril (VASOTEC) 20 MG tablet Take 20 mg by mouth daily. Take tablets by mouth once a day     Historical Provider, MD  escitalopram (LEXAPRO) 20 MG tablet Take 20 mg by mouth. 02/03/15   Historical Provider, MD  gabapentin (NEURONTIN) 300 MG capsule Take 300 mg by mouth. 12/21/14   Historical Provider, MD  hydrochlorothiazide 25 MG tablet Take 25 mg by mouth daily.      Historical Provider, MD  omeprazole (PRILOSEC) 40 MG capsule Take 40 mg by mouth. 12/17/14   Historical Provider, MD  simvastatin (ZOCOR) 40 MG tablet Take 40 mg by mouth. 02/03/15   Historical Provider, MD  solifenacin (VESICARE) 10 MG tablet Take 10 mg by mouth. 12/25/14   Historical Provider, MD  tranexamic acid (LYSTEDA) 650 MG TABS Take 2  tablets (1,300 mg total) by mouth 3 (three) times daily. Take for 5 days 08/29/12   Lahoma Crocker, MD   BP 131/77 mmHg  Pulse 89  Temp(Src) 97.9 F (36.6 C) (Oral)  Resp 18  Ht 5\' 7"  (1.702 m)  Wt 360 lb (163.295 kg)  BMI 56.37 kg/m2  SpO2 98%  LMP 07/11/2014 Physical Exam  Constitutional: She is oriented to person, place, and time. She appears well-developed and well-nourished. No distress.  HENT:  Head: Normocephalic and atraumatic.  Mouth/Throat: Oropharynx is clear and moist.  Eyes: Conjunctivae are normal. Pupils are equal, round, and reactive to light.  Neck: Normal range of motion. Neck supple.  Intact phonation  no pain with displacement of the trachea  Cardiovascular: Normal rate, regular rhythm and intact distal pulses.   Pulmonary/Chest: Effort normal and breath sounds normal. No stridor. No respiratory distress. She has no wheezes. She has no rales.  Abdominal: Soft. Bowel sounds are normal. There is no tenderness. There is no rebound and no guarding.  Musculoskeletal: Normal range of motion.  Lymphadenopathy:    She has no cervical adenopathy.  Neurological: She is alert and oriented to person, place, and time.  Skin: Skin is warm and dry.  Psychiatric: She has a normal mood and affect.    ED Course  Procedures (including critical care time) Labs Review Labs Reviewed - No data to display  Imaging Review No results found. I have personally reviewed and evaluated these images and lab results as part of my medical decision-making.   EKG Interpretation None      MDM   Final diagnoses:  None   Here with her significant other who is also a patient   URI: based on centor score there is no indication for further testing or treatment.  Salt water gargles TID x 5 days and ibuprofen for pain    Taisei Bonnette, MD 05/07/15 0302

## 2015-05-07 NOTE — Discharge Instructions (Signed)
Cool Mist Vaporizers  Vaporizers may help relieve the symptoms of a cough and cold. They add moisture to the air, which helps mucus to become thinner and less sticky. This makes it easier to breathe and cough up secretions. Cool mist vaporizers do not cause serious burns like hot mist vaporizers, which may also be called steamers or humidifiers. Vaporizers have not been proven to help with colds. You should not use a vaporizer if you are allergic to mold.  HOME CARE INSTRUCTIONS  · Follow the package instructions for the vaporizer.  · Do not use anything other than distilled water in the vaporizer.  · Do not run the vaporizer all of the time. This can cause mold or bacteria to grow in the vaporizer.  · Clean the vaporizer after each time it is used.  · Clean and dry the vaporizer well before storing it.  · Stop using the vaporizer if worsening respiratory symptoms develop.     This information is not intended to replace advice given to you by your health care provider. Make sure you discuss any questions you have with your health care provider.     Document Released: 11/03/2003 Document Revised: 02/10/2013 Document Reviewed: 06/25/2012  Elsevier Interactive Patient Education ©2016 [employer]

## 2015-05-07 NOTE — ED Notes (Signed)
Cough and cold symptoms for a week with subjective fever, here with another patient who complains of same

## 2015-05-07 NOTE — ED Notes (Signed)
Pt verbalizes understanding of d/c instructions and denies any further needs at this time. 

## 2015-05-25 ENCOUNTER — Encounter (HOSPITAL_BASED_OUTPATIENT_CLINIC_OR_DEPARTMENT_OTHER): Payer: Self-pay | Admitting: *Deleted

## 2015-05-25 ENCOUNTER — Emergency Department (HOSPITAL_BASED_OUTPATIENT_CLINIC_OR_DEPARTMENT_OTHER)
Admission: EM | Admit: 2015-05-25 | Discharge: 2015-05-25 | Disposition: A | Discharge status: Home / Self Care | Payer: Medicare Other | Attending: Emergency Medicine | Admitting: Emergency Medicine

## 2015-05-25 DIAGNOSIS — Z79899 Other long term (current) drug therapy: Secondary | ICD-10-CM | POA: Insufficient documentation

## 2015-05-25 DIAGNOSIS — F329 Major depressive disorder, single episode, unspecified: Secondary | ICD-10-CM | POA: Insufficient documentation

## 2015-05-25 DIAGNOSIS — K219 Gastro-esophageal reflux disease without esophagitis: Secondary | ICD-10-CM | POA: Diagnosis not present

## 2015-05-25 DIAGNOSIS — Z86018 Personal history of other benign neoplasm: Secondary | ICD-10-CM | POA: Insufficient documentation

## 2015-05-25 DIAGNOSIS — Z8742 Personal history of other diseases of the female genital tract: Secondary | ICD-10-CM | POA: Insufficient documentation

## 2015-05-25 DIAGNOSIS — E669 Obesity, unspecified: Secondary | ICD-10-CM | POA: Insufficient documentation

## 2015-05-25 DIAGNOSIS — Z8739 Personal history of other diseases of the musculoskeletal system and connective tissue: Secondary | ICD-10-CM | POA: Diagnosis not present

## 2015-05-25 DIAGNOSIS — Z7982 Long term (current) use of aspirin: Secondary | ICD-10-CM | POA: Insufficient documentation

## 2015-05-25 DIAGNOSIS — J209 Acute bronchitis, unspecified: Secondary | ICD-10-CM | POA: Diagnosis not present

## 2015-05-25 DIAGNOSIS — J40 Bronchitis, not specified as acute or chronic: Secondary | ICD-10-CM

## 2015-05-25 DIAGNOSIS — I10 Essential (primary) hypertension: Secondary | ICD-10-CM | POA: Diagnosis not present

## 2015-05-25 DIAGNOSIS — R05 Cough: Secondary | ICD-10-CM | POA: Diagnosis present

## 2015-05-25 MED ORDER — PREDNISONE 50 MG PO TABS
60.0000 mg | ORAL_TABLET | Freq: Once | ORAL | Status: AC
  Administered 2015-05-25: 60 mg via ORAL
  Filled 2015-05-25: qty 1

## 2015-05-25 MED ORDER — ALBUTEROL SULFATE (2.5 MG/3ML) 0.083% IN NEBU
5.0000 mg | INHALATION_SOLUTION | Freq: Once | RESPIRATORY_TRACT | Status: AC
  Administered 2015-05-25: 5 mg via RESPIRATORY_TRACT
  Filled 2015-05-25: qty 6

## 2015-05-25 MED ORDER — AZITHROMYCIN 250 MG PO TABS: 250.0000 mg | ORAL_TABLET | Freq: Every day | ORAL | Status: DC

## 2015-05-25 MED ORDER — IPRATROPIUM BROMIDE 0.02 % IN SOLN
0.5000 mg | Freq: Once | RESPIRATORY_TRACT | Status: AC
  Administered 2015-05-25: 0.5 mg via RESPIRATORY_TRACT
  Filled 2015-05-25: qty 2.5

## 2015-05-25 MED ORDER — PREDNISONE 20 MG PO TABS: 60.0000 mg | ORAL_TABLET | Freq: Every day | ORAL | Status: DC

## 2015-05-25 NOTE — ED Notes (Signed)
Patient states she has a approximately three week history of productive cough, was seen here two weeks ago for the same.  States she has slight improvement until three days ago and now has sore throat, productive cough with yellow secretions; symptoms are associated with chills and sweats, no diffident temperature.

## 2015-05-25 NOTE — Discharge Instructions (Signed)

## 2015-05-25 NOTE — ED Provider Notes (Signed)
CSN: [id number]     Arrival date & time 05/25/15  1008 History   First MD Initiated Contact with Patient 05/25/15 1017     Chief Complaint  Patient presents with  . Cough     (Consider location/radiation/quality/duration/timing/severity/associated sxs/prior Treatment) HPI Comments: Patient returns to the ED with complaint of persistent/worsening cough that is productive, sore throat, chills, sweats. She denies fever at any time. She was seen 2 weeks ago and diagnosed with a URI that was treated with OTC medications. She reports she felt some better until 3-4 days ago when she started to feel symptoms were worsening. She take allergy medications regularly, albuterol inhaler and nebulizer treatments. She is a nonsmoker. No nausea, vomiting or significant nasal congestion.   Patient is a 55 y.o. female presenting with cough. The history is provided by the patient. No language interpreter was used.  Cough Cough characteristics:  Dry and hacking Severity:  Moderate Duration:  2 weeks Associated symptoms: chills, diaphoresis, myalgias, shortness of breath and sore throat   Associated symptoms: no chest pain, no headaches and no rash     Past Medical History  Diagnosis Date  . Depression   . GERD (gastroesophageal reflux disease)   . Hypertension   . Lipoma   . Degenerative joint disease   . Bronchitis     hx of  . Obesity   . Hernia   . ENDOMETRIAL POLYP 01/08/2006    Dr. Agnes Lawrence.  2001:  D&C, benign;  07/14/10:  D&C, hysteroscopy, polypectomy and bx of mons pubis (peau d'orange)...... 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   Past Surgical History  Procedure Laterality Date  . Cholecystectomy    . Resection of lipoma    . Umbilical hernia repair     No family history on file. Social History  Substance Use Topics  . Smoking status: Never Smoker   . Smokeless tobacco: None  . Alcohol Use: No   OB History    No data available     Review of  Systems  Constitutional: Positive for chills and diaphoresis.  HENT: Positive for sore throat. Negative for congestion and trouble swallowing.   Respiratory: Positive for cough, chest tightness and shortness of breath.   Cardiovascular: Negative for chest pain.  Gastrointestinal: Negative for nausea and abdominal pain.  Musculoskeletal: Positive for myalgias.  Skin: Negative for rash.  Neurological: Negative for weakness and headaches.      Allergies  Codeine  Home Medications   Prior to Admission medications   Medication Sig Start Date End Date Taking? Authorizing Provider  albuterol (PROAIR HFA) 108 (90 Base) MCG/ACT inhaler  12/31/14  Yes Historical Provider, MD  aspirin (BL ADULT ASPIRIN LOW STRENGTH) 81 MG chewable tablet Chew 81 mg by mouth daily.     Yes Historical Provider, MD  cetirizine (ZYRTEC) 10 MG tablet TAKE 1 TABLET BY MOUTH ONCE A DAY 07/01/10  Yes Alphia Moh, MD  ciclopirox (LOPROX) 0.77 % cream Apply topically. 03/09/15  Yes Historical Provider, MD  citalopram (CELEXA) 20 MG tablet  06/04/12  Yes Historical Provider, MD  cyclobenzaprine (FLEXERIL) 10 MG tablet Take 1 tablet (10 mg total) by mouth every 8 (eight) hours as needed. 03/29/15  Yes Dene Gentry, MD  enalapril (VASOTEC) 20 MG tablet Take 20 mg by mouth daily. Take tablets by mouth once a day    Yes Historical Provider, MD  escitalopram (LEXAPRO) 20 MG tablet Take 20 mg by mouth. 02/03/15  Yes Historical Provider,  MD  gabapentin (NEURONTIN) 300 MG capsule Take 300 mg by mouth. 12/21/14  Yes Historical Provider, MD  ibuprofen (ADVIL,MOTRIN) 600 MG tablet Take 1 tablet (600 mg total) by mouth every 6 (six) hours as needed. 05/07/15  Yes April Palumbo, MD  omeprazole (PRILOSEC) 40 MG capsule Take 40 mg by mouth. 12/17/14  Yes Historical Provider, MD  simvastatin (ZOCOR) 40 MG tablet Take 40 mg by mouth. 02/03/15  Yes Historical Provider, MD  solifenacin (VESICARE) 10 MG tablet Take 10 mg by mouth. 12/25/14   Yes Historical Provider, MD  tranexamic acid (LYSTEDA) 650 MG TABS Take 2 tablets (1,300 mg total) by mouth 3 (three) times daily. Take for 5 days 08/29/12  Yes Lahoma Crocker, MD  hydrochlorothiazide 25 MG tablet Take 25 mg by mouth daily.      Historical Provider, MD   BP 152/100 mmHg  Pulse 95  Temp(Src) 98.5 F (36.9 C) (Oral)  Resp 20  Ht 5\' 8"  (1.727 m)  Wt 158.759 kg  BMI 53.23 kg/m2  SpO2 98%  LMP 07/11/2014 Physical Exam  Constitutional: She is oriented to person, place, and time. She appears well-developed and well-nourished.  HENT:  Head: Normocephalic.  Nose: Mucosal edema present.  Mouth/Throat: Uvula is midline. No oropharyngeal exudate, posterior oropharyngeal edema or posterior oropharyngeal erythema.  Eyes: Conjunctivae are normal.  Neck: Normal range of motion. Neck supple.  Cardiovascular: Normal rate and regular rhythm.   No murmur heard. Pulmonary/Chest: Effort normal and breath sounds normal. She has no wheezes. She has no rales.  Abdominal: Soft. Bowel sounds are normal. There is no tenderness. There is no rebound and no guarding.  Musculoskeletal: Normal range of motion. She exhibits no edema.  Neurological: She is alert and oriented to person, place, and time.  Skin: Skin is warm and dry. No rash noted.  Psychiatric: She has a normal mood and affect.    ED Course  Procedures (including critical care time) Labs Review Labs Reviewed - No data to display  Imaging Review No results found. I have personally reviewed and evaluated these images and lab results as part of my medical decision-making.   EKG Interpretation None      MDM   Final diagnoses:  None    1. Bronchitis  The patient presents with cough and congestion for the past 2 weeks. No reported fever. History of the same. Using nebulizers at home with other OTC medications with some relief, worsening now. Duoneb provided with improvement in her symptoms. Will start abx given  duration of symptoms, stay on prednisone for an additional 3 days and encourage PCP follow up.    Charlann Lange, PA-C 05/25/15 [place], MD 05/25/15 1356

## 2015-05-26 ENCOUNTER — Telehealth: Payer: Self-pay | Admitting: Family Medicine

## 2015-05-26 NOTE — Telephone Encounter (Signed)
She would have to call her insurance to ask them if they'll cover it.  If they do ask her to call us back and we can check on getting prior authorization.

## 2015-06-16 ENCOUNTER — Telehealth: Payer: Self-pay | Admitting: Family Medicine

## 2015-06-16 NOTE — Telephone Encounter (Signed)
Unable to contact. Will try again.

## 2015-07-08 ENCOUNTER — Ambulatory Visit: Payer: Medicare Other | Admitting: Family Medicine

## 2015-07-20 ENCOUNTER — Ambulatory Visit (INDEPENDENT_AMBULATORY_CARE_PROVIDER_SITE_OTHER): Payer: Medicare Other | Admitting: Family Medicine

## 2015-07-20 ENCOUNTER — Encounter: Payer: Self-pay | Admitting: Family Medicine

## 2015-07-20 VITALS — BP 139/88 | HR 108 | Ht 67.0 in | Wt 357.0 lb

## 2015-07-20 DIAGNOSIS — M170 Bilateral primary osteoarthritis of knee: Secondary | ICD-10-CM

## 2015-07-20 DIAGNOSIS — M129 Arthropathy, unspecified: Secondary | ICD-10-CM

## 2015-07-20 MED ORDER — SODIUM HYALURONATE (VISCOSUP) 25 MG/2.5ML IX SOSY
2.5000 mL | PREFILLED_SYRINGE | Freq: Once | INTRA_ARTICULAR | Status: AC
  Administered 2015-07-20: 2.5 mL via INTRA_ARTICULAR

## 2015-07-20 NOTE — Progress Notes (Signed)
PCP: PROVIDER NOT IN SYSTEM  Subjective:   HPI: Patient is a 55 y.o. female here for bilateral knee pain.  2/7: Patient reports she has had over 3-4 years of bilateral anterior knee pain. Pain up to 10/10 level, sharp. Worse left knee than the right knee. Associated popping. Remotely had cortisone injections at [facility] which provided some benefit. + swelling. No skin changes, fever, other complaints. She also reported some midline deep neck pain posteriorly without radiation - has history of DDD. Is being referred to pain management as well.  3/8: Patient reports injections only helped for a few days. Pain in both knees 9/10 now, sharp, anterior. Had not heard from physical therapy. Taking tylenol at bedtime. No skin changes, fever, other complaints.  5/31: Patient returns for bilateral supartz injections. Pain in both knees anteriorly 10/10, sharp. Swelling in knees.  Past Medical History  Diagnosis Date  . Depression   . GERD (gastroesophageal reflux disease)   . Hypertension   . Lipoma   . Degenerative joint disease   . Bronchitis     hx of  . Obesity   . Hernia   . ENDOMETRIAL POLYP 01/08/2006    Dr. Agnes Lawrence.  2001:  D&C, benign;  07/14/10:  D&C, hysteroscopy, polypectomy and bx of mons pubis (peau d'orange)...... 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    Current Outpatient Prescriptions on File Prior to Visit  Medication Sig Dispense Refill  . albuterol (PROAIR HFA) 108 (90 Base) MCG/ACT inhaler     . aspirin (BL ADULT ASPIRIN LOW STRENGTH) 81 MG chewable tablet Chew 81 mg by mouth daily.      Marland Kitchen azithromycin (ZITHROMAX) 250 MG tablet Take 1 tablet (250 mg total) by mouth daily. Take first 2 tablets together, then 1 every day until finished. 6 tablet 0  . cetirizine (ZYRTEC) 10 MG tablet TAKE 1 TABLET BY MOUTH ONCE A DAY 90 tablet 2  . ciclopirox (LOPROX) 0.77 % cream Apply topically.    . citalopram (CELEXA) 20 MG tablet     .  cyclobenzaprine (FLEXERIL) 10 MG tablet Take 1 tablet (10 mg total) by mouth every 8 (eight) hours as needed. 60 tablet 1  . enalapril (VASOTEC) 20 MG tablet Take 20 mg by mouth daily. Take tablets by mouth once a day     . escitalopram (LEXAPRO) 20 MG tablet Take 20 mg by mouth.    . gabapentin (NEURONTIN) 300 MG capsule Take 300 mg by mouth.    . hydrochlorothiazide 25 MG tablet Take 25 mg by mouth daily.      Marland Kitchen ibuprofen (ADVIL,MOTRIN) 600 MG tablet Take 1 tablet (600 mg total) by mouth every 6 (six) hours as needed. 30 tablet 0  . omeprazole (PRILOSEC) 40 MG capsule Take 40 mg by mouth.    . predniSONE (DELTASONE) 20 MG tablet Take 3 tablets (60 mg total) by mouth daily with breakfast. 9 tablet 0  . simvastatin (ZOCOR) 40 MG tablet Take 40 mg by mouth.    . solifenacin (VESICARE) 10 MG tablet Take 10 mg by mouth.    . tranexamic acid (LYSTEDA) 650 MG TABS Take 2 tablets (1,300 mg total) by mouth 3 (three) times daily. Take for 5 days 15 tablet 1  . [DISCONTINUED] fluticasone (FLONASE) 50 MCG/ACT nasal spray Place 2 sprays into the nose daily. Apply 2 sprays in each nostril at bedtime      No current facility-administered medications on file prior to visit.    Past  Surgical History  Procedure Laterality Date  . Cholecystectomy    . Resection of lipoma    . Umbilical hernia repair      Allergies  Allergen Reactions  . Codeine Itching    Only happened one time. Did not occur last time patient was in the hospital.    Social History   Social History  . Marital Status: Single    Spouse Name: N/A  . Number of Children: N/A  . Years of Education: N/A   Occupational History  . Not on file.   Social History Main Topics  . Smoking status: Never Smoker   . Smokeless tobacco: Not on file  . Alcohol Use: No  . Drug Use: No  . Sexual Activity: Not on file   Other Topics Concern  . Not on file   Social History Narrative   Occupation: unemployed   single    No family history  on file.  BP 139/88 mmHg  Pulse 108  Ht 5\' 7"  (1.702 m)  Wt 357 lb (161.934 kg)  BMI 55.90 kg/m2  LMP 07/11/2014  Review of Systems: See HPI above.    Objective:  Physical Exam:  Gen: NAD, comfortable in exam room. Exam not repeated today  Bilateral knees: Soft tissue swelling.  No obvious effusion.  No gross deformity, ecchymoses. TTP medial and lateral joint lines > post patellar facets.  No other tenderness. FROM. Negative ant/post drawers. Negative valgus/varus testing. Negative lachmanns. Negative mcmurrays, apleys, patellar apprehension. NV intact distally.    Assessment & Plan:  1. Bilateral knee arthritis - s/p cortisone injections without much relief.  Previously discussed tylenol, nsaids, glucosamine, topical medications.  Went ahead with first supartz injections today.  After informed written consent patient was lying supine on exam table.  Right knee was prepped with alcohol swab.  Utilizing superolateral approach, 3 mL of marcaine was used for local anesthesia.  Then using an 18g needle on 60cc syringe, 56 mL of clear straw-colored fluid was aspirated from right knee.  Knee was then injected with supartz.  Patient tolerated procedure well without immediate complications  After informed written consent, patient was seated on exam table. Left knee was prepped with alcohol swab and utilizing anteromedial approach, patient's left knee was injected intraarticularly with 31mL marcaine followed by Jacklyn Shell.  Patient tolerated the procedure well without immediate complications.

## 2015-07-20 NOTE — Assessment & Plan Note (Signed)
s/p cortisone injections without much relief.  Previously discussed tylenol, nsaids, glucosamine, topical medications.  Went ahead with first supartz injections today.  After informed written consent patient was lying supine on exam table.  Right knee was prepped with alcohol swab.  Utilizing superolateral approach, 3 mL of marcaine was used for local anesthesia.  Then using an 18g needle on 60cc syringe, 56 mL of clear straw-colored fluid was aspirated from right knee.  Knee was then injected with supartz.  Patient tolerated procedure well without immediate complications  After informed written consent, patient was seated on exam table. Left knee was prepped with alcohol swab and utilizing anteromedial approach, patient's left knee was injected intraarticularly with 38mL marcaine followed by Jacklyn Shell.  Patient tolerated the procedure well without immediate complications.

## 2015-07-25 ENCOUNTER — Telehealth: Payer: Self-pay | Admitting: Family Medicine

## 2015-07-25 MED ORDER — IBUPROFEN 600 MG PO TABS: 600.0000 mg | ORAL_TABLET | Freq: Three times a day (TID) | ORAL | Status: DC | PRN

## 2015-07-25 NOTE — Telephone Encounter (Signed)
That's because her arthritis is severe, it can do that.  We're hoping the gel shots give her some relief but it won't be immediate.  Ok to refill ibuprofen - I sent this to the [facility] on [address].  I wouldn't recommend a narcotic for this.  She can take tylenol in addition to the ibuprofen, topical medicines like capsaicin, and glucosamine if she isn't allergic to shellfish.

## 2015-07-27 ENCOUNTER — Ambulatory Visit: Payer: Medicare Other | Admitting: Family Medicine

## 2015-08-05 ENCOUNTER — Other Ambulatory Visit: Payer: Self-pay | Admitting: Family Medicine

## 2015-09-01 ENCOUNTER — Ambulatory Visit: Payer: Medicare Other | Admitting: Family Medicine

## 2015-09-19 NOTE — Telephone Encounter (Signed)
FInished

## 2015-11-29 ENCOUNTER — Encounter (HOSPITAL_BASED_OUTPATIENT_CLINIC_OR_DEPARTMENT_OTHER): Payer: Self-pay | Admitting: *Deleted

## 2015-11-29 ENCOUNTER — Emergency Department (HOSPITAL_BASED_OUTPATIENT_CLINIC_OR_DEPARTMENT_OTHER)
Admission: EM | Admit: 2015-11-29 | Discharge: 2015-11-29 | Disposition: A | Discharge status: Home / Self Care | Payer: Medicare Other | Attending: Emergency Medicine | Admitting: Emergency Medicine

## 2015-11-29 ENCOUNTER — Emergency Department (HOSPITAL_BASED_OUTPATIENT_CLINIC_OR_DEPARTMENT_OTHER): Payer: Medicare Other

## 2015-11-29 DIAGNOSIS — J209 Acute bronchitis, unspecified: Secondary | ICD-10-CM | POA: Insufficient documentation

## 2015-11-29 DIAGNOSIS — R05 Cough: Secondary | ICD-10-CM | POA: Diagnosis present

## 2015-11-29 DIAGNOSIS — Z79899 Other long term (current) drug therapy: Secondary | ICD-10-CM | POA: Insufficient documentation

## 2015-11-29 DIAGNOSIS — B9789 Other viral agents as the cause of diseases classified elsewhere: Secondary | ICD-10-CM

## 2015-11-29 DIAGNOSIS — J069 Acute upper respiratory infection, unspecified: Secondary | ICD-10-CM | POA: Diagnosis not present

## 2015-11-29 DIAGNOSIS — Z7982 Long term (current) use of aspirin: Secondary | ICD-10-CM | POA: Diagnosis not present

## 2015-11-29 DIAGNOSIS — I10 Essential (primary) hypertension: Secondary | ICD-10-CM | POA: Insufficient documentation

## 2015-11-29 MED ORDER — ALBUTEROL SULFATE HFA 108 (90 BASE) MCG/ACT IN AERS
4.0000 | INHALATION_SPRAY | Freq: Once | RESPIRATORY_TRACT | Status: AC
  Administered 2015-11-29: 4 via RESPIRATORY_TRACT
  Filled 2015-11-29: qty 6.7

## 2015-11-29 MED ORDER — DOXYCYCLINE HYCLATE 100 MG PO CAPS: 100.0000 mg | ORAL_CAPSULE | Freq: Two times a day (BID) | ORAL | 0 refills | Status: AC

## 2015-11-29 MED ORDER — BENZONATATE 100 MG PO CAPS: 100.0000 mg | ORAL_CAPSULE | Freq: Three times a day (TID) | ORAL | 0 refills | Status: DC

## 2015-11-29 MED ORDER — ALBUTEROL SULFATE HFA 108 (90 BASE) MCG/ACT IN AERS: 2.0000 | INHALATION_SPRAY | RESPIRATORY_TRACT | 0 refills | Status: AC | PRN

## 2015-11-29 MED FILL — DOXYCYCLINE HYC 100 MG CAP: 100 | 7 days supply | Qty: 14 | Fill #0

## 2015-11-29 MED FILL — BENZONATATE 100 MG CAPSULE: 100 | 7 days supply | Qty: 21 | Fill #0

## 2015-11-29 MED FILL — PROAIR HFA 90 MCG INHALER: 108 (90 BAS | 17 days supply | Qty: 9 | Fill #0

## 2015-11-29 NOTE — ED Triage Notes (Signed)
Cough x 4 days. She feels SOB with exertion. Tongue is painful. States her has pressure in her chest when she coughs.

## 2015-11-29 NOTE — ED Notes (Signed)
Patient transported to X-ray 

## 2015-11-29 NOTE — ED Notes (Signed)
Dry cough, running nose, and sore throat since last Friday. Taking mucinex without relief.

## 2015-11-29 NOTE — ED Provider Notes (Signed)
[facility] [place] [facility] Provider Note   CSN: [id number] Arrival date & time: 11/29/15  1035     History   Chief Complaint Chief Complaint  Patient presents with  . Cough    HPI Ellen [name] is a 55 y.o. female.  HPI 55 year old female with past medical history of obesity and recurrent bronchitis who presents with cough, congestion, and runny nose. Patient states that her symptoms started approximately 5 days ago. Her symptoms started as a mild sore throat and nasal congestion. She then developed generalized body aches over the following 24 hours that have now resolved. Over the last several days, her nasal congestion and sore throat have improved but she now has persistent, worsening cough productive of sputum. She has mild associated shortness of breath. Denies any chest pain beyond mild chest discomfort after coughing. She does have known sick contacts. No fevers.  Past Medical History:  Diagnosis Date  . Bronchitis    hx of  . Degenerative joint disease   . Depression   . ENDOMETRIAL POLYP 01/08/2006   Dr. Agnes Lawrence.  2001:  D&C, benign;  07/14/10:  D&C, hysteroscopy, polypectomy and bx of mons pubis (peau d'orange)...... 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  Marland Kitchen GERD (gastroesophageal reflux disease)   . Hernia   . Hypertension   . Lipoma   . Obesity     Patient Active Problem List   Diagnosis Date Noted  . Urgency of micturation 04/14/2015  . Dizziness 04/14/2015  . Neck pain 03/31/2015  . Abnormal uterine bleeding 08/29/2012  . Incarcerated ventral hernia 09/11/2010  . COUGH 05/13/2009  . ALLERGIC RHINITIS, SEASONAL 05/26/2008  . SHOULDER PAIN, LEFT 08/11/2007  . PRURITUS, VAGINAL 09/30/2006  . BACK PAIN 08/05/2006  . 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 LIPOMA 01/08/2006  . DYSLIPIDEMIA 01/08/2006  . OBESITY, MORBID 01/08/2006  . DEPRESSION 01/08/2006  . HYPERTENSION 01/08/2006  . BRONCHITIS NOS 01/08/2006  . GERD  01/08/2006  . HERNIA, VENTRAL 01/08/2006  . ENDOMETRIAL POLYP 01/08/2006  . DEGENERATIVE JOINT DISEASE 01/08/2006    Past Surgical History:  Procedure Laterality Date  . CHOLECYSTECTOMY    . resection of lipoma    . UMBILICAL HERNIA REPAIR      OB History    No data available       Home Medications    Prior to Admission medications   Medication Sig Start Date End Date Taking? Authorizing Provider  aspirin (BL ADULT ASPIRIN LOW STRENGTH) 81 MG chewable tablet Chew 81 mg by mouth daily.     Yes Historical Provider, MD  cetirizine (ZYRTEC) 10 MG tablet TAKE 1 TABLET BY MOUTH ONCE A DAY 07/01/10  Yes Alphia Moh, MD  ciclopirox (LOPROX) 0.77 % cream Apply topically. 03/09/15  Yes Historical Provider, MD  citalopram (CELEXA) 20 MG tablet  06/04/12  Yes Historical Provider, MD  enalapril (VASOTEC) 20 MG tablet Take 20 mg by mouth daily. Take tablets by mouth once a day    Yes Historical Provider, MD  escitalopram (LEXAPRO) 20 MG tablet Take 20 mg by mouth. 02/03/15  Yes Historical Provider, MD  gabapentin (NEURONTIN) 300 MG capsule Take 300 mg by mouth. 12/21/14  Yes Historical Provider, MD  phentermine (ADIPEX-P) 37.5 MG tablet Take 37.5 mg by mouth daily before breakfast.   Yes Historical Provider, MD  simvastatin (ZOCOR) 40 MG tablet Take 40 mg by mouth. 02/03/15  Yes Historical Provider, MD  solifenacin (VESICARE) 10 MG tablet Take 10 mg by mouth.  12/25/14  Yes Historical Provider, MD  albuterol (PROVENTIL HFA;VENTOLIN HFA) 108 (90 Base) MCG/ACT inhaler Inhale 2 puffs into the lungs every 4 (four) hours as needed for wheezing or shortness of breath. 11/29/15   Duffy Bruce, MD  benzonatate (TESSALON) 100 MG capsule Take 1 capsule (100 mg total) by mouth every 8 (eight) hours. 11/29/15   Duffy Bruce, MD  cyclobenzaprine (FLEXERIL) 10 MG tablet Take 1 tablet (10 mg total) by mouth every 8 (eight) hours as needed. 03/29/15   Dene Gentry, MD  doxycycline (VIBRAMYCIN) 100 MG  capsule Take 1 capsule (100 mg total) by mouth 2 (two) times daily. 11/29/15 12/06/15  Duffy Bruce, MD  hydrochlorothiazide 25 MG tablet Take 25 mg by mouth daily.      Historical Provider, MD  ibuprofen (ADVIL,MOTRIN) 600 MG tablet Take 1 tablet (600 mg total) by mouth every 8 (eight) hours as needed. 07/25/15   Dene Gentry, MD  omeprazole (PRILOSEC) 40 MG capsule Take 40 mg by mouth. 12/17/14   Historical Provider, MD  predniSONE (DELTASONE) 20 MG tablet Take 3 tablets (60 mg total) by mouth daily with breakfast. 05/25/15   Charlann Lange, PA-C  tranexamic acid (LYSTEDA) 650 MG TABS Take 2 tablets (1,300 mg total) by mouth 3 (three) times daily. Take for 5 days 08/29/12   Lahoma Crocker, MD    Family History No family history on file.  Social History Social History  Substance Use Topics  . Smoking status: Never Smoker  . Smokeless tobacco: Never Used  . Alcohol use No     Allergies   Codeine   Review of Systems Review of Systems  Constitutional: Positive for fatigue. Negative for chills and fever.  HENT: Positive for congestion, rhinorrhea and sore throat.   Eyes: Negative for visual disturbance.  Respiratory: Positive for cough and shortness of breath. Negative for wheezing.   Cardiovascular: Negative for chest pain and leg swelling.  Gastrointestinal: Negative for abdominal pain, diarrhea, nausea and vomiting.  Genitourinary: Negative for dysuria, flank pain, vaginal bleeding and vaginal discharge.  Musculoskeletal: Negative for neck pain.  Skin: Negative for rash.  Allergic/Immunologic: Negative for immunocompromised state.  Neurological: Negative for syncope and headaches.  Hematological: Does not bruise/bleed easily.  All other systems reviewed and are negative.    Physical Exam Updated Vital Signs BP 150/86   Pulse 92   Temp 97.7 F (36.5 C) (Oral)   Resp 22   Ht 5\' 7"  (1.702 m)   Wt (!) 390 lb (176.9 kg)   LMP 07/11/2014   SpO2 98%   BMI 61.08 kg/m     Physical Exam  Constitutional: She is oriented to person, place, and time. She appears well-developed and well-nourished. No distress.  HENT:  Head: Normocephalic and atraumatic.  Eyes: Conjunctivae are normal.  Neck: Neck supple.  Cardiovascular: Normal rate, regular rhythm and normal heart sounds.  Exam reveals no friction rub.   No murmur heard. Pulmonary/Chest: Effort normal. No respiratory distress. She has wheezes (Faint, end expiratory only). She has no rales.  Abdominal: Soft. Bowel sounds are normal. She exhibits no distension.  Musculoskeletal: She exhibits no edema.  Neurological: She is alert and oriented to person, place, and time. She exhibits normal muscle tone.  Skin: Skin is warm. Capillary refill takes less than 2 seconds.  Psychiatric: She has a normal mood and affect.  Nursing note and vitals reviewed.    ED Treatments / Results  Labs (all labs ordered are listed, but only abnormal  results are displayed) Labs Reviewed - No data to display  EKG  EKG Interpretation None       Radiology Dg Chest 2 View  Result Date: 11/29/2015 CLINICAL DATA:  Cough, congestion, sore throat EXAM: CHEST  2 VIEW COMPARISON:  03/19/2011 FINDINGS: Heart and mediastinal contours are within normal limits. No focal opacities or effusions. No acute bony abnormality. IMPRESSION: No active cardiopulmonary disease. Electronically Signed   By: Rolm Baptise M.D.   On: 11/29/2015 11:23    Procedures Procedures (including critical care time)  Medications Ordered in ED Medications  albuterol (PROVENTIL HFA;VENTOLIN HFA) 108 (90 Base) MCG/ACT inhaler 4 puff (4 puffs Inhalation Given 11/29/15 1120)     Initial Impression / Assessment and Plan / ED Course  I have reviewed the triage vital signs and the nursing notes.  Pertinent labs & imaging results that were available during my care of the patient were reviewed by me and considered in my medical decision making (see chart for  details).  Clinical Course   44 old female who presents with a several day history of cough, congestion, and sore throat. Vital signs are stable and within normal limits. Examination is as above. Suspect viral URI with possible acute on chronic bronchitis. Chest x-ray shows no pneumonia. Symptoms completely resolved with albuterol inhaler. She has no associated chest pain and I do not suspect ACS or CHF. She is not tachycardic, tachypnea, hypoxia, and I do not suspect PE. She has known sick contacts. Will treat for possible acute on chronic bronchitis. Given her obesity and hypertension, I feel the risks of steroids outweigh the benefits but will advise regular use of albuterol at home. She had not been using it prior to today. Return precautions given.  Final Clinical Impressions(s) / ED Diagnoses   Final diagnoses:  Viral URI with cough  Acute bronchitis, unspecified organism    New Prescriptions Discharge Medication List as of 11/29/2015 12:06 PM    START taking these medications   Details  benzonatate (TESSALON) 100 MG capsule Take 1 capsule (100 mg total) by mouth every 8 (eight) hours., Starting Tue 11/29/2015, Print    doxycycline (VIBRAMYCIN) 100 MG capsule Take 1 capsule (100 mg total) by mouth 2 (two) times daily., Starting Tue 11/29/2015, Until Tue 12/06/2015, Print         Duffy Bruce, MD 11/29/15 [phone]

## 2016-02-13 ENCOUNTER — Encounter (HOSPITAL_BASED_OUTPATIENT_CLINIC_OR_DEPARTMENT_OTHER): Payer: Self-pay

## 2016-02-13 ENCOUNTER — Emergency Department (HOSPITAL_BASED_OUTPATIENT_CLINIC_OR_DEPARTMENT_OTHER)
Admission: EM | Admit: 2016-02-13 | Discharge: 2016-02-13 | Disposition: A | Discharge status: Home / Self Care | Payer: Medicare Other | Attending: Emergency Medicine | Admitting: Emergency Medicine

## 2016-02-13 DIAGNOSIS — Z79899 Other long term (current) drug therapy: Secondary | ICD-10-CM | POA: Insufficient documentation

## 2016-02-13 DIAGNOSIS — I10 Essential (primary) hypertension: Secondary | ICD-10-CM | POA: Diagnosis not present

## 2016-02-13 DIAGNOSIS — R229 Localized swelling, mass and lump, unspecified: Secondary | ICD-10-CM | POA: Diagnosis not present

## 2016-02-13 DIAGNOSIS — R208 Other disturbances of skin sensation: Secondary | ICD-10-CM

## 2016-02-13 DIAGNOSIS — R109 Unspecified abdominal pain: Secondary | ICD-10-CM | POA: Diagnosis present

## 2016-02-13 NOTE — Discharge Instructions (Signed)
You may have a developing infection, it is difficult to tell at this point. You should see a medical doctor if it becomes more swollen, red, hot, and more painful (like a boil) immediately. Keep a close eye on the area to make sure it is not changing. You may apply ice and try tylenol for pain.  Follow up with your primary care doctor this week You may purchase over the counter 4% lidocaine patches at any drugstore  And apply to that area to help with your pain.

## 2016-02-13 NOTE — ED Triage Notes (Signed)
C/o bilat flank pain x 5 days-NAD-steady gait

## 2016-02-13 NOTE — ED Notes (Signed)
ED Provider at bedside. 

## 2016-02-13 NOTE — ED Notes (Signed)
Pt states unable to provide urine at this time, RN Vinnie Level informed.

## 2016-02-13 NOTE — ED Provider Notes (Signed)
[facility] Provider Note   CSN: [id number] Arrival date & time: 02/13/16  1235     History   Chief Complaint Chief Complaint  Patient presents with  . Flank Pain    HPI Ellen [name] is a 55 y.o. female 2. Morbid obesity and hypertension who presents emergency Department with complaint of painful nodule. The patient has a tender nodule on the inferior fold of skin in her back throughout the area where her last ribs sit. The nodule is new to the patient's knowledge and states it is tender to touch. It hurts when she stretches her back. She denies any discharge, localized warmth. She has a history of a previous pilonidal abscess.  HPI  Past Medical History:  Diagnosis Date  . Bronchitis    hx of  . Degenerative joint disease   . Depression   . ENDOMETRIAL POLYP 01/08/2006   Dr. Agnes Lawrence.  2001:  D&C, benign;  07/14/10:  D&C, hysteroscopy, polypectomy and bx of mons pubis (peau d'orange)...... 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  Marland Kitchen GERD (gastroesophageal reflux disease)   . Hernia   . Hypertension   . Lipoma   . Obesity     Patient Active Problem List   Diagnosis Date Noted  . Urgency of micturation 04/14/2015  . Dizziness 04/14/2015  . Neck pain 03/31/2015  . Abnormal uterine bleeding 08/29/2012  . Incarcerated ventral hernia 09/11/2010  . COUGH 05/13/2009  . ALLERGIC RHINITIS, SEASONAL 05/26/2008  . SHOULDER PAIN, LEFT 08/11/2007  . PRURITUS, VAGINAL 09/30/2006  . BACK PAIN 08/05/2006  . 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 LIPOMA 01/08/2006  . DYSLIPIDEMIA 01/08/2006  . OBESITY, MORBID 01/08/2006  . DEPRESSION 01/08/2006  . HYPERTENSION 01/08/2006  . BRONCHITIS NOS 01/08/2006  . GERD 01/08/2006  . HERNIA, VENTRAL 01/08/2006  . ENDOMETRIAL POLYP 01/08/2006  . DEGENERATIVE JOINT DISEASE 01/08/2006    Past Surgical History:  Procedure Laterality Date  . CHOLECYSTECTOMY    . resection of lipoma    .  UMBILICAL HERNIA REPAIR      OB History    No data available       Home Medications    Prior to Admission medications   Medication Sig Start Date End Date Taking? Authorizing Provider  albuterol (PROVENTIL HFA;VENTOLIN HFA) 108 (90 Base) MCG/ACT inhaler Inhale 2 puffs into the lungs every 4 (four) hours as needed for wheezing or shortness of breath. 11/29/15   Duffy Bruce, MD  aspirin (BL ADULT ASPIRIN LOW STRENGTH) 81 MG chewable tablet Chew 81 mg by mouth daily.      Historical Provider, MD  cetirizine (ZYRTEC) 10 MG tablet TAKE 1 TABLET BY MOUTH ONCE A DAY 07/01/10   Alphia Moh, MD  ciclopirox (LOPROX) 0.77 % cream Apply topically. 03/09/15   Historical Provider, MD  citalopram (CELEXA) 20 MG tablet  06/04/12   Historical Provider, MD  enalapril (VASOTEC) 20 MG tablet Take 20 mg by mouth daily. Take tablets by mouth once a day     Historical Provider, MD  escitalopram (LEXAPRO) 20 MG tablet Take 20 mg by mouth. 02/03/15   Historical Provider, MD  gabapentin (NEURONTIN) 300 MG capsule Take 300 mg by mouth. 12/21/14   Historical Provider, MD  omeprazole (PRILOSEC) 40 MG capsule Take 40 mg by mouth. 12/17/14   Historical Provider, MD  phentermine (ADIPEX-P) 37.5 MG tablet Take 37.5 mg by mouth daily before breakfast.    Historical Provider, MD  simvastatin (ZOCOR) 40 MG  tablet Take 40 mg by mouth. 02/03/15   Historical Provider, MD  solifenacin (VESICARE) 10 MG tablet Take 10 mg by mouth. 12/25/14   Historical Provider, MD  tranexamic acid (LYSTEDA) 650 MG TABS Take 2 tablets (1,300 mg total) by mouth 3 (three) times daily. Take for 5 days 08/29/12   Lahoma Crocker, MD    Family History No family history on file.  Social History Social History  Substance Use Topics  . Smoking status: Never Smoker  . Smokeless tobacco: Never Used  . Alcohol use No     Allergies   Codeine   Review of Systems Review of Systems  Ten systems are reviewed and are negative for acute  change except as noted in the HPI  Physical Exam Updated Vital Signs BP 146/98 (BP Location: Left Wrist)   Pulse 104   Temp 97.9 F (36.6 C) (Oral)   Resp 20   Ht 5\' 6"  (1.676 m)   Wt (!) 173.3 kg   LMP 07/11/2014   SpO2 95%   BMI 61.66 kg/m   Physical Exam  Constitutional: She is oriented to person, place, and time. She appears well-developed and well-nourished. No distress.  HENT:  Head: Normocephalic and atraumatic.  Eyes: Conjunctivae are normal. No scleral icterus.  Neck: Normal range of motion.  Cardiovascular: Normal rate, regular rhythm and normal heart sounds.  Exam reveals no gallop and no friction rub.   No murmur heard. Pulmonary/Chest: Effort normal and breath sounds normal. No respiratory distress.  Abdominal: Soft. Bowel sounds are normal. She exhibits no distension and no mass. There is no tenderness. There is no guarding.  Neurological: She is alert and oriented to person, place, and time.  Skin: Skin is warm and dry. She is not diaphoretic.     Nursing note and vitals reviewed.    ED Treatments / Results  Labs (all labs ordered are listed, but only abnormal results are displayed) Labs Reviewed - No data to display  EKG  EKG Interpretation None       Radiology No results found.  Procedures Procedures (including critical care time)  Medications Ordered in ED Medications - No data to display   Initial Impression / Assessment and Plan / ED Course  I have reviewed the triage vital signs and the nursing notes.  Pertinent labs & imaging results that were available during my care of the patient were reviewed by me and considered in my medical decision making (see chart for details).  Clinical Course     patient with intertrigo She has a tender subcutaneous nodule in the skin of her back. It feels most consistent with a lipoma, however, made be a developing infection considering its tenderness. I've advised the patient follow up with her  primary care physician. She'll buy over-the-counter Lamisil for treatment of a fungal infections associated with the skin folds. She can use Tylenol. I advised patient for home examinations and watchful waiting. Discussed return precautions with the patient. She'll follow-up with her PCP this week for reevaluation and potential outpatient imaging of the nodule.  Final Clinical Impressions(s) / ED Diagnoses   Final diagnoses:  Single skin nodule  Localized tenderness of skin    New Prescriptions New Prescriptions   No medications on file     Margarita Mail, PA-C 02/13/16 [place], MD 02/13/16 [id number]

## 2017-01-17 ENCOUNTER — Emergency Department (HOSPITAL_BASED_OUTPATIENT_CLINIC_OR_DEPARTMENT_OTHER)
Admission: EM | Admit: 2017-01-17 | Discharge: 2017-01-17 | Disposition: A | Discharge status: Home / Self Care | Payer: Medicare Other | Attending: Emergency Medicine | Admitting: Emergency Medicine

## 2017-01-17 ENCOUNTER — Emergency Department (HOSPITAL_BASED_OUTPATIENT_CLINIC_OR_DEPARTMENT_OTHER): Payer: Medicare Other

## 2017-01-17 ENCOUNTER — Encounter (HOSPITAL_BASED_OUTPATIENT_CLINIC_OR_DEPARTMENT_OTHER): Payer: Self-pay | Admitting: *Deleted

## 2017-01-17 ENCOUNTER — Other Ambulatory Visit: Payer: Self-pay

## 2017-01-17 DIAGNOSIS — Y9389 Activity, other specified: Secondary | ICD-10-CM | POA: Insufficient documentation

## 2017-01-17 DIAGNOSIS — I10 Essential (primary) hypertension: Secondary | ICD-10-CM | POA: Insufficient documentation

## 2017-01-17 DIAGNOSIS — Y92481 Parking lot as the place of occurrence of the external cause: Secondary | ICD-10-CM | POA: Insufficient documentation

## 2017-01-17 DIAGNOSIS — Y999 Unspecified external cause status: Secondary | ICD-10-CM | POA: Insufficient documentation

## 2017-01-17 DIAGNOSIS — T148XXA Other injury of unspecified body region, initial encounter: Secondary | ICD-10-CM | POA: Diagnosis not present

## 2017-01-17 DIAGNOSIS — Z79899 Other long term (current) drug therapy: Secondary | ICD-10-CM | POA: Diagnosis not present

## 2017-01-17 DIAGNOSIS — Z7982 Long term (current) use of aspirin: Secondary | ICD-10-CM | POA: Diagnosis not present

## 2017-01-17 HISTORY — DX: Pure hypercholesterolemia, unspecified: E78.00

## 2017-01-17 MED ORDER — METHOCARBAMOL 500 MG PO TABS: 500.0000 mg | ORAL_TABLET | Freq: Three times a day (TID) | ORAL | 0 refills | Status: DC | PRN

## 2017-01-17 MED ORDER — METHOCARBAMOL 500 MG PO TABS
500.0000 mg | ORAL_TABLET | Freq: Once | ORAL | Status: AC
  Administered 2017-01-17: 500 mg via ORAL
  Filled 2017-01-17: qty 1

## 2017-01-17 MED ORDER — KETOROLAC TROMETHAMINE 60 MG/2ML IM SOLN
60.0000 mg | Freq: Once | INTRAMUSCULAR | Status: AC
  Administered 2017-01-17: 60 mg via INTRAMUSCULAR
  Filled 2017-01-17: qty 2

## 2017-01-17 MED FILL — tiZANidine HCL 4 MG TABS: 4 | 10 days supply | Qty: 30 | Fill #0
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
## 2017-01-22 ENCOUNTER — Other Ambulatory Visit: Payer: Self-pay

## 2017-01-22 ENCOUNTER — Emergency Department (HOSPITAL_BASED_OUTPATIENT_CLINIC_OR_DEPARTMENT_OTHER)
Admission: EM | Admit: 2017-01-22 | Discharge: 2017-01-22 | Disposition: A | Discharge status: Home / Self Care | Payer: Medicare Other | Attending: Emergency Medicine | Admitting: Emergency Medicine

## 2017-01-22 ENCOUNTER — Encounter (HOSPITAL_BASED_OUTPATIENT_CLINIC_OR_DEPARTMENT_OTHER): Payer: Self-pay | Admitting: *Deleted

## 2017-01-22 DIAGNOSIS — S161XXD Strain of muscle, fascia and tendon at neck level, subsequent encounter: Secondary | ICD-10-CM | POA: Insufficient documentation

## 2017-01-22 DIAGNOSIS — Z79899 Other long term (current) drug therapy: Secondary | ICD-10-CM | POA: Diagnosis not present

## 2017-01-22 DIAGNOSIS — Z7982 Long term (current) use of aspirin: Secondary | ICD-10-CM | POA: Diagnosis not present

## 2017-01-22 DIAGNOSIS — T148XXA Other injury of unspecified body region, initial encounter: Secondary | ICD-10-CM

## 2017-01-22 DIAGNOSIS — I10 Essential (primary) hypertension: Secondary | ICD-10-CM | POA: Insufficient documentation

## 2017-01-22 MED ORDER — CYCLOBENZAPRINE HCL 10 MG PO TABS: 10.0000 mg | ORAL_TABLET | Freq: Two times a day (BID) | ORAL | 0 refills | Status: DC | PRN

## 2017-01-22 MED FILL — CYCLOBENZAPRINE 10 MG TABLE: 10 | 10 days supply | Qty: 20 | Fill #0
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
## 2017-01-22 NOTE — Discharge Instructions (Signed)
You can take Tylenol or Ibuprofen as directed for pain. You can alternate Tylenol and Ibuprofen every 4 hours. If you take Tylenol at 1pm, then you can take Ibuprofen at 5pm. Then you can take Tylenol again at 9pm.   Take Flexeril as prescribed. This medication will make you drowsy so do not drive or drink alcohol when taking it.  Follow-up with your primary care doctor in the next 2-4 days.   Return to the Emergency Department immediately for any worsening back pain, neck pain, difficulty walking, numbness/weaknss of your arms or legs, urinary or bowel accidents, fever or any other worsening or concerning symptoms.

## 2017-02-10 ENCOUNTER — Encounter (HOSPITAL_BASED_OUTPATIENT_CLINIC_OR_DEPARTMENT_OTHER): Payer: Self-pay

## 2017-02-10 ENCOUNTER — Emergency Department (HOSPITAL_BASED_OUTPATIENT_CLINIC_OR_DEPARTMENT_OTHER): Payer: Medicare Other

## 2017-02-10 ENCOUNTER — Emergency Department (HOSPITAL_BASED_OUTPATIENT_CLINIC_OR_DEPARTMENT_OTHER)
Admission: EM | Admit: 2017-02-10 | Discharge: 2017-02-10 | Disposition: A | Discharge status: Home / Self Care | Payer: Medicare Other | Attending: Emergency Medicine | Admitting: Emergency Medicine

## 2017-02-10 ENCOUNTER — Other Ambulatory Visit: Payer: Self-pay

## 2017-02-10 DIAGNOSIS — M25511 Pain in right shoulder: Secondary | ICD-10-CM | POA: Diagnosis present

## 2017-02-10 DIAGNOSIS — I10 Essential (primary) hypertension: Secondary | ICD-10-CM | POA: Diagnosis not present

## 2017-02-10 MED ORDER — CYCLOBENZAPRINE HCL 10 MG PO TABS: 10.0000 mg | ORAL_TABLET | Freq: Two times a day (BID) | ORAL | 0 refills | Status: DC | PRN

## 2017-02-10 MED ORDER — METHOCARBAMOL 500 MG PO TABS: 500.0000 mg | ORAL_TABLET | Freq: Three times a day (TID) | ORAL | 0 refills | Status: DC | PRN
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
## 2017-02-10 NOTE — Discharge Instructions (Signed)
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
## 2017-04-05 ENCOUNTER — Emergency Department (HOSPITAL_BASED_OUTPATIENT_CLINIC_OR_DEPARTMENT_OTHER): Payer: Medicare Other

## 2017-04-05 ENCOUNTER — Encounter (HOSPITAL_BASED_OUTPATIENT_CLINIC_OR_DEPARTMENT_OTHER): Payer: Self-pay

## 2017-04-05 ENCOUNTER — Inpatient Hospital Stay (HOSPITAL_BASED_OUTPATIENT_CLINIC_OR_DEPARTMENT_OTHER)
Admission: EM | Admit: 2017-04-05 | Discharge: 2017-04-08 | DRG: 152 | Disposition: A | Discharge status: Home / Self Care | Payer: Medicare Other | Attending: Internal Medicine | Admitting: Internal Medicine

## 2017-04-05 DIAGNOSIS — Z7982 Long term (current) use of aspirin: Secondary | ICD-10-CM

## 2017-04-05 DIAGNOSIS — B974 Respiratory syncytial virus as the cause of diseases classified elsewhere: Secondary | ICD-10-CM | POA: Diagnosis present

## 2017-04-05 DIAGNOSIS — J9601 Acute respiratory failure with hypoxia: Secondary | ICD-10-CM | POA: Diagnosis present

## 2017-04-05 DIAGNOSIS — Z7951 Long term (current) use of inhaled steroids: Secondary | ICD-10-CM | POA: Diagnosis not present

## 2017-04-05 DIAGNOSIS — E871 Hypo-osmolality and hyponatremia: Secondary | ICD-10-CM | POA: Diagnosis present

## 2017-04-05 DIAGNOSIS — F419 Anxiety disorder, unspecified: Secondary | ICD-10-CM | POA: Diagnosis present

## 2017-04-05 DIAGNOSIS — F329 Major depressive disorder, single episode, unspecified: Secondary | ICD-10-CM | POA: Diagnosis present

## 2017-04-05 DIAGNOSIS — I361 Nonrheumatic tricuspid (valve) insufficiency: Secondary | ICD-10-CM | POA: Diagnosis not present

## 2017-04-05 DIAGNOSIS — Z885 Allergy status to narcotic agent status: Secondary | ICD-10-CM

## 2017-04-05 DIAGNOSIS — R4182 Altered mental status, unspecified: Secondary | ICD-10-CM | POA: Diagnosis present

## 2017-04-05 DIAGNOSIS — Z6841 Body Mass Index (BMI) 40.0 and over, adult: Secondary | ICD-10-CM

## 2017-04-05 DIAGNOSIS — E7800 Pure hypercholesterolemia, unspecified: Secondary | ICD-10-CM | POA: Diagnosis present

## 2017-04-05 DIAGNOSIS — R7989 Other specified abnormal findings of blood chemistry: Secondary | ICD-10-CM

## 2017-04-05 DIAGNOSIS — J069 Acute upper respiratory infection, unspecified: Principal | ICD-10-CM | POA: Diagnosis present

## 2017-04-05 DIAGNOSIS — R6510 Systemic inflammatory response syndrome (SIRS) of non-infectious origin without acute organ dysfunction: Secondary | ICD-10-CM | POA: Diagnosis not present

## 2017-04-05 DIAGNOSIS — R0602 Shortness of breath: Secondary | ICD-10-CM | POA: Diagnosis not present

## 2017-04-05 DIAGNOSIS — I10 Essential (primary) hypertension: Secondary | ICD-10-CM | POA: Diagnosis present

## 2017-04-05 DIAGNOSIS — R778 Other specified abnormalities of plasma proteins: Secondary | ICD-10-CM | POA: Diagnosis present

## 2017-04-05 DIAGNOSIS — J302 Other seasonal allergic rhinitis: Secondary | ICD-10-CM | POA: Diagnosis present

## 2017-04-05 DIAGNOSIS — K219 Gastro-esophageal reflux disease without esophagitis: Secondary | ICD-10-CM | POA: Diagnosis present

## 2017-04-05 DIAGNOSIS — Z79899 Other long term (current) drug therapy: Secondary | ICD-10-CM

## 2017-04-05 DIAGNOSIS — Z713 Dietary counseling and surveillance: Secondary | ICD-10-CM | POA: Diagnosis not present

## 2017-04-05 DIAGNOSIS — Z9049 Acquired absence of other specified parts of digestive tract: Secondary | ICD-10-CM

## 2017-04-05 DIAGNOSIS — E785 Hyperlipidemia, unspecified: Secondary | ICD-10-CM | POA: Diagnosis present

## 2017-04-05 DIAGNOSIS — R748 Abnormal levels of other serum enzymes: Secondary | ICD-10-CM | POA: Diagnosis present

## 2017-04-05 LAB — COMPREHENSIVE METABOLIC PANEL
ALK PHOS: 100 U/L (ref 38–126)
ALT: 19 U/L (ref 14–54)
AST: 23 U/L (ref 15–41)
Albumin: 3.5 g/dL (ref 3.5–5.0)
Anion gap: 9 (ref 5–15)
BILIRUBIN TOTAL: 0.6 mg/dL (ref 0.3–1.2)
BUN: 9 mg/dL (ref 6–20)
CALCIUM: 10.4 mg/dL — AB (ref 8.9–10.3)
CO2: 25 mmol/L (ref 22–32)
Chloride: 98 mmol/L — ABNORMAL LOW (ref 101–111)
Creatinine, Ser: 0.67 mg/dL (ref 0.44–1.00)
GFR calc Af Amer: >60 mL/min (ref >60)
GFR calc non Af Amer: >60 mL/min (ref >60)
GLUCOSE: 129 mg/dL — AB (ref 65–99)
Potassium: 3.9 mmol/L (ref 3.5–5.1)
SODIUM: 132 mmol/L — AB (ref 135–145)
Total Protein: 7.8 g/dL (ref 6.5–8.1)

## 2017-04-05 LAB — I-STAT VENOUS BLOOD GAS, ED
ACID-BASE EXCESS: 2 mmol/L (ref 0.0–2.0)
BICARBONATE: 26.4 mmol/L (ref 20.0–28.0)
O2 Saturation: 78 %
TCO2: 28 mmol/L (ref 22–32)
pCO2, Ven: 43.7 mmHg — ABNORMAL LOW (ref 44.0–60.0)
pH, Ven: 7.4 (ref 7.250–7.430)
pO2, Ven: 49 mmHg — ABNORMAL HIGH (ref 32.0–45.0)

## 2017-04-05 LAB — CBC WITH DIFFERENTIAL/PLATELET
BASOS PCT: 0 %
Basophils Absolute: 0 10*3/uL (ref 0.0–0.1)
EOS ABS: 0 10*3/uL (ref 0.0–0.7)
EOS PCT: 0 %
HEMATOCRIT: 40.2 % (ref 36.0–46.0)
Hemoglobin: 13.7 g/dL (ref 12.0–15.0)
Lymphocytes Relative: 10 %
Lymphs Abs: 1.4 10*3/uL (ref 0.7–4.0)
MCH: 31.4 pg (ref 26.0–34.0)
MCHC: 34.1 g/dL (ref 30.0–36.0)
MCV: 92 fL (ref 78.0–100.0)
MONO ABS: 2.2 10*3/uL — AB (ref 0.1–1.0)
MONOS PCT: 16 %
NEUTROS ABS: 10.4 10*3/uL — AB (ref 1.7–7.7)
Neutrophils Relative %: 74 %
PLATELETS: 305 10*3/uL (ref 150–400)
RBC: 4.37 MIL/uL (ref 3.87–5.11)
RDW: 11.9 % (ref 11.5–15.5)
WBC: 14.1 10*3/uL — ABNORMAL HIGH (ref 4.0–10.5)

## 2017-04-05 LAB — INFLUENZA PANEL BY PCR (TYPE A & B)
INFLAPCR: NEGATIVE
Influenza B By PCR: NEGATIVE

## 2017-04-05 LAB — I-STAT CG4 LACTIC ACID, ED: Lactic Acid, Venous: 1 mmol/L (ref 0.5–1.9)

## 2017-04-05 LAB — TROPONIN I
Troponin I: 0.14 ng/mL (ref <0.03)
Troponin I: 0.14 ng/mL (ref <0.03)

## 2017-04-05 MED ORDER — SODIUM CHLORIDE 0.9 % IV BOLUS (SEPSIS)
1000.0000 mL | Freq: Once | INTRAVENOUS | Status: AC
  Administered 2017-04-05: 1000 mL via INTRAVENOUS

## 2017-04-05 MED ORDER — CEFTRIAXONE SODIUM 2 G IJ SOLR
2.0000 g | Freq: Once | INTRAMUSCULAR | Status: AC
  Administered 2017-04-05: 2 g via INTRAVENOUS
  Filled 2017-04-05: qty 20

## 2017-04-05 MED ORDER — DEXAMETHASONE SODIUM PHOSPHATE 10 MG/ML IJ SOLN
10.0000 mg | Freq: Once | INTRAMUSCULAR | Status: AC
  Administered 2017-04-05: 10 mg via INTRAVENOUS
  Filled 2017-04-05: qty 1

## 2017-04-05 MED ORDER — ASPIRIN 81 MG PO CHEW
324.0000 mg | CHEWABLE_TABLET | Freq: Once | ORAL | Status: AC
Start: 2017-04-05 — End: 2017-04-05
  Administered 2017-04-05: 324 mg via ORAL
  Filled 2017-04-05: qty 4

## 2017-04-05 MED ORDER — ACETAMINOPHEN 500 MG PO TABS
1000.0000 mg | ORAL_TABLET | Freq: Once | ORAL | Status: AC
  Administered 2017-04-05: 1000 mg via ORAL
  Filled 2017-04-05: qty 2

## 2017-04-05 MED ORDER — VANCOMYCIN HCL IN DEXTROSE 1-5 GM/200ML-% IV SOLN
1000.0000 mg | Freq: Once | INTRAVENOUS | Status: AC
  Administered 2017-04-05: 1000 mg via INTRAVENOUS
  Filled 2017-04-05: qty 200

## 2017-04-05 MED ORDER — OSELTAMIVIR PHOSPHATE 75 MG PO CAPS
75.0000 mg | ORAL_CAPSULE | Freq: Once | ORAL | Status: AC
  Administered 2017-04-05: 75 mg via ORAL
  Filled 2017-04-05: qty 1

## 2017-04-05 MED ORDER — VANCOMYCIN HCL 10 G IV SOLR
2000.0000 mg | Freq: Once | INTRAVENOUS | Status: DC
  Filled 2017-04-05: qty 2000

## 2017-04-05 NOTE — ED Notes (Addendum)
Date and time results received: 04/05/17 1246  Test: Trop I Critical Value: 0.14  Name of Provider Notified: Quintella Reichert, MD  Orders Received? Or Actions Taken?: MD acknowledged receiving the result.

## 2017-04-05 NOTE — Progress Notes (Signed)
57 year old female presents with febrile illness and hypoxic respiratory failure.  Patient does have the significant past medical history for hypertension, dyslipidemia and obesity.  She initially presented to [facility] with flulike illness, altered mentation and fever.  She received IV fluids, IV antibiotics and IV steroids, as well as Tamiflu.  After initial treatment her mentation improved but she was noted to be hypoxic down to 88% on room air, her chest x-ray was clear for infiltrates, her troponin was elevated 0.14.  EKG was sinus tachycardia 126 bpm.   Considering acute hypoxic respiratory failure and elevated troponin patient is being transferred to [facility] for further evaluation.

## 2017-04-05 NOTE — Progress Notes (Signed)
Patient arrived from [place]. Paged [facility] admissions and let them now.  Jule Whitsel, RN

## 2017-04-05 NOTE — ED Triage Notes (Addendum)
Pt reports headache, body aches, cough, nasal congestion, sore throat, fever, N/V since Sunday; denies taking anything for symptoms since they began. Pt reports emesis x 1 today.

## 2017-04-05 NOTE — ED Notes (Signed)
Patient attempted to get a urine sample but missed the cup. EMT told patient that we will try for another sample in a little.

## 2017-04-05 NOTE — ED Notes (Signed)
Date and time results received: 04/05/17 1828  Test: Trop Critical Value: 0.14  Name of Provider Notified: Dr. Kathrynn Humble  Orders Received? Or Actions Taken?: Acknowledged receipt of results

## 2017-04-05 NOTE — ED Notes (Signed)
Loss of 2nd IV access while in CT; MD notified and okay with only 1 IV access at this time.

## 2017-04-05 NOTE — ED Provider Notes (Signed)
[place] EMERGENCY DEPARTMENT Provider Note   CSN: [id number] Arrival date & time: 04/05/17  1054     History   Chief Complaint Chief Complaint  Patient presents with  . Generalized Body Aches    HPI Ellen [name] is a 57 y.o. female.  The history is provided by the patient. No language interpreter was used.    Ellen [name] is a 57 y.o. female who presents to the Emergency Department complaining of "sick."  Level 5 caveat due to altered mental status.  She reports feeling sick starting on Sunday with weakness and bronchitis.  She has difficulty walking and headache.  She feels confused and has difficulty describing her symptoms.  She does report associated sore throat, cough, nasal congestion, vomiting.  She does report having sick contacts.  No diarrhea.  Past Medical History:  Diagnosis Date  . Bronchitis    hx of  . Degenerative joint disease   . Depression   . ENDOMETRIAL POLYP 01/08/2006   Dr. Agnes Lawrence.  2001:  D&C, benign;  07/14/10:  D&C, hysteroscopy, polypectomy and bx of mons pubis (peau d'orange)...... 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  Marland Kitchen GERD (gastroesophageal reflux disease)   . Hernia   . High cholesterol   . Hypertension   . Lipoma   . Obesity     Patient Active Problem List   Diagnosis Date Noted  . Urgency of micturation 04/14/2015  . Dizziness 04/14/2015  . Neck pain 03/31/2015  . Abnormal uterine bleeding 08/29/2012  . Incarcerated ventral hernia 09/11/2010  . COUGH 05/13/2009  . ALLERGIC RHINITIS, SEASONAL 05/26/2008  . SHOULDER PAIN, LEFT 08/11/2007  . PRURITUS, VAGINAL 09/30/2006  . BACK PAIN 08/05/2006  . 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 LIPOMA 01/08/2006  . DYSLIPIDEMIA 01/08/2006  . OBESITY, MORBID 01/08/2006  . DEPRESSION 01/08/2006  . HYPERTENSION 01/08/2006  . BRONCHITIS NOS 01/08/2006  . GERD 01/08/2006  . HERNIA, VENTRAL 01/08/2006  . ENDOMETRIAL POLYP 01/08/2006    . DEGENERATIVE JOINT DISEASE 01/08/2006    Past Surgical History:  Procedure Laterality Date  . CHOLECYSTECTOMY    . DILATION AND CURETTAGE OF UTERUS    . resection of lipoma    . UMBILICAL HERNIA REPAIR      OB History    No data available       Home Medications    Prior to Admission medications   Medication Sig Start Date End Date Taking? Authorizing Provider  albuterol (PROVENTIL HFA;VENTOLIN HFA) 108 (90 Base) MCG/ACT inhaler Inhale 2 puffs into the lungs every 4 (four) hours as needed for wheezing or shortness of breath. 11/29/15   Duffy Bruce, MD  aspirin (BL ADULT ASPIRIN LOW STRENGTH) 81 MG chewable tablet Chew 81 mg by mouth daily.      [provider]  cetirizine (ZYRTEC) 10 MG tablet TAKE 1 TABLET BY MOUTH ONCE A DAY 07/01/10   Alphia Moh, MD  ciclopirox (LOPROX) 0.77 % cream Apply topically. 03/09/15   [provider]  citalopram (CELEXA) 20 MG tablet  06/04/12   [provider]  cyclobenzaprine (FLEXERIL) 10 MG tablet Take 1 tablet (10 mg total) by mouth 2 (two) times daily as needed for muscle spasms. 02/10/17   Long, Wonda Olds, MD  enalapril (VASOTEC) 20 MG tablet Take 20 mg by mouth daily. Take tablets by mouth once a day     [provider]  escitalopram (LEXAPRO) 20 MG tablet Take 20 mg by mouth. 02/03/15  [provider]  gabapentin (NEURONTIN) 300 MG capsule Take 300 mg by mouth. 12/21/14   [provider]  omeprazole (PRILOSEC) 40 MG capsule Take 40 mg by mouth. 12/17/14   [provider]  phentermine (ADIPEX-P) 37.5 MG tablet Take 37.5 mg by mouth daily before breakfast.    [provider]  simvastatin (ZOCOR) 40 MG tablet Take 40 mg by mouth. 02/03/15   [provider]  solifenacin (VESICARE) 10 MG tablet Take 10 mg by mouth. 12/25/14   [provider]  tranexamic acid (LYSTEDA) 650 MG TABS Take 2 tablets (1,300 mg total) by mouth 3 (three) times daily. Take for 5  days 08/29/12   Lahoma Crocker, MD  fluticasone [facility]) 50 MCG/ACT nasal spray Place 2 sprays into the nose daily. Apply 2 sprays in each nostril at bedtime   05/11/11  [provider]    Family History No family history on file.  Social History Social History   Tobacco Use  . Smoking status: Never Smoker  . Smokeless tobacco: Never Used  Substance Use Topics  . Alcohol use: No    Alcohol/week: 0.0 oz  . Drug use: No     Allergies   Codeine   Review of Systems Review of Systems  All other systems reviewed and are negative.    Physical Exam Updated Vital Signs BP (!) 116/103   Pulse 99   Temp 99 F (37.2 C) (Oral)   Resp (!) 25   Ht 5\' 7"  (1.702 m)   Wt (!) 158.8 kg (350 lb)   LMP 07/11/2014   SpO2 94%   BMI 54.82 kg/m   Physical Exam  Constitutional: She appears well-developed and well-nourished.  Ill-appearing  HENT:  Head: Normocephalic and atraumatic.  Mild conjunctival drainage bilaterally.  Mild rhinorrhea.  Minimal erythema in the posterior oropharynx.  Cardiovascular: Regular rhythm.  No murmur heard. tachcyardic  Pulmonary/Chest: Effort normal and breath sounds normal. No respiratory distress.  Abdominal: Soft. There is no tenderness. There is no rebound and no guarding.  Musculoskeletal: She exhibits no edema or tenderness.  Neurological:  Very confused, slow to answer questions.  Drowsy but conversant.  Moves all extremities symmetrically.  Skin: Skin is warm and dry.  Psychiatric: She has a normal mood and affect. Her behavior is normal.  Nursing note and vitals reviewed.    ED Treatments / Results  Labs (all labs ordered are listed, but only abnormal results are displayed) Labs Reviewed  COMPREHENSIVE METABOLIC PANEL - Abnormal; Notable for the following components:      Result Value   Sodium 132 (*)    Chloride 98 (*)    Glucose, Bld 129 (*)    Calcium 10.4 (*)    All other components within normal limits  CBC WITH  DIFFERENTIAL/PLATELET - Abnormal; Notable for the following components:   WBC 14.1 (*)    Neutro Abs 10.4 (*)    Monocytes Absolute 2.2 (*)    All other components within normal limits  TROPONIN I - Abnormal; Notable for the following components:   Troponin I 0.14 (*)    All other components within normal limits  I-STAT VENOUS BLOOD GAS, ED - Abnormal; Notable for the following components:   pCO2, Ven 43.7 (*)    pO2, Ven 49.0 (*)    All other components within normal limits  CULTURE, BLOOD (ROUTINE X 2)  CULTURE, BLOOD (ROUTINE X 2)  URINALYSIS, ROUTINE W REFLEX MICROSCOPIC  PREGNANCY, URINE  INFLUENZA PANEL BY  PCR (TYPE A & B)  TROPONIN I  I-STAT CG4 LACTIC ACID, ED  I-STAT CG4 LACTIC ACID, ED    EKG  EKG Interpretation  Date/Time:  Friday April 05 2017 12:08:49 EST Ventricular Rate:  126 PR Interval:    QRS Duration: 85 QT Interval:  279 QTC Calculation: 404 R Axis:   65 Text Interpretation:  Sinus tachycardia Low voltage, precordial leads Borderline repolarization abnormality Minimal ST elevation, lateral leads Baseline wander in lead(s) II III aVF V1 V2 Confirmed by Quintella Reichert [phone]) on 04/05/2017 12:12:35 PM       Radiology Dg Chest 2 View  Result Date: 04/05/2017 CLINICAL DATA:  Cough, fever, congestion EXAM: CHEST  2 VIEW COMPARISON:  01/17/2017 FINDINGS: The heart size and mediastinal contours are within normal limits. Both lungs are clear. The visualized skeletal structures are unremarkable. IMPRESSION: No active cardiopulmonary disease. Electronically Signed   By: Kathreen Devoid   On: 04/05/2017 12:55   Ct Head Wo Contrast  Result Date: 04/05/2017 CLINICAL DATA:  Altered mental status.  Leg weakness. EXAM: CT HEAD WITHOUT CONTRAST TECHNIQUE: Contiguous axial images were obtained from the base of the skull through the vertex without intravenous contrast. COMPARISON:  CT head dated May 11, 2011. FINDINGS: Brain: No evidence of acute infarction, hemorrhage,  hydrocephalus, extra-axial collection or mass lesion/mass effect. Unchanged dense calcification along the tentorium and falx. Vascular: No hyperdense vessel or unexpected calcification. Skull: Normal. Negative for fracture or focal lesion. Sinuses/Orbits: Mild paranasal sinus mucosal thickening. Clear mastoid air cells. No acute orbital abnormality. Other: None. IMPRESSION: 1.  No acute intracranial abnormality. Electronically Signed   By: Titus Dubin M.D.   On: 04/05/2017 13:00    Procedures Procedures (including critical care time) CRITICAL CARE Performed by: Quintella Reichert   Total critical care time: 35 minutes  Critical care time was exclusive of separately billable procedures and treating other patients.  Critical care was necessary to treat or prevent imminent or life-threatening deterioration.  Critical care was time spent personally by me on the following activities: development of treatment plan with patient and/or surrogate as well as nursing, discussions with consultants, evaluation of patient's response to treatment, examination of patient, obtaining history from patient or surrogate, ordering and performing treatments and interventions, ordering and review of laboratory studies, ordering and review of radiographic studies, pulse oximetry and re-evaluation of patient's condition.  Medications Ordered in ED Medications  oseltamivir (TAMIFLU) capsule 75 mg (not administered)  acetaminophen (TYLENOL) tablet 1,000 mg (1,000 mg Oral Given 04/05/17 1127)  dexamethasone (DECADRON) injection 10 mg (10 mg Intravenous Given 04/05/17 1259)  cefTRIAXone (ROCEPHIN) 2 g in sodium chloride 0.9 % 100 mL IVPB (0 g Intravenous Stopped 04/05/17 1334)  sodium chloride 0.9 % bolus 1,000 mL (0 mLs Intravenous Stopped 04/05/17 1335)  vancomycin (VANCOCIN) IVPB 1000 mg/200 mL premix (0 mg Intravenous Stopped 04/05/17 1445)  sodium chloride 0.9 % bolus 1,000 mL (1,000 mLs Intravenous New Bag/Given 04/05/17  1454)     Initial Impression / Assessment and Plan / ED Course  I have reviewed the triage vital signs and the nursing notes.  Pertinent labs & imaging results that were available during my care of the patient were reviewed by me and considered in my medical decision making (see chart for details).      Pt here for evaluation of feeling "sick" on initial eval concern for possible sepsis/meningitis based on initial confusion and she was treated empirically with antibiotics.    1400 on patient recheck  she endorses feeling better, mental status is significantly improved.  She began feeling ill on Sunday with difficulty breathing - thought it was bronchitis.  She had associated runny nose, watery eyes, headache, cough productive of yellow sputum.  No chest pain.    On repeat assessment with significant improvement in mental status feel meningitis is highly unlikely.    She does have new oxygen requirement of 2 L [place], troponin is mildly elevated.  Plan to admit for further treatment.  Patient initially requested [facility].  They were contacted and did agree to admit the patient but no beds were available.  Patient is now agreeable to admission at [facility] - hospitalist contacted for admission.     Final Clinical Impressions(s) / ED Diagnoses   Final diagnoses:  Acute respiratory failure with hypoxia [facility])    ED Discharge Orders    None       Quintella Reichert, MD 04/05/17 1736

## 2017-04-06 ENCOUNTER — Inpatient Hospital Stay (HOSPITAL_COMMUNITY): Payer: Medicare Other

## 2017-04-06 ENCOUNTER — Other Ambulatory Visit (HOSPITAL_COMMUNITY): Payer: Medicare Other

## 2017-04-06 ENCOUNTER — Other Ambulatory Visit: Payer: Self-pay

## 2017-04-06 ENCOUNTER — Encounter (HOSPITAL_COMMUNITY): Payer: Self-pay | Admitting: Family Medicine

## 2017-04-06 DIAGNOSIS — J9601 Acute respiratory failure with hypoxia: Secondary | ICD-10-CM

## 2017-04-06 DIAGNOSIS — R0602 Shortness of breath: Secondary | ICD-10-CM

## 2017-04-06 DIAGNOSIS — I361 Nonrheumatic tricuspid (valve) insufficiency: Secondary | ICD-10-CM

## 2017-04-06 DIAGNOSIS — F419 Anxiety disorder, unspecified: Secondary | ICD-10-CM | POA: Diagnosis present

## 2017-04-06 LAB — RESPIRATORY PANEL BY PCR
Adenovirus: NOT DETECTED
BORDETELLA PERTUSSIS-RVPCR: NOT DETECTED
CORONAVIRUS HKU1-RVPPCR: NOT DETECTED
Chlamydophila pneumoniae: NOT DETECTED
Coronavirus 229E: NOT DETECTED
Coronavirus NL63: NOT DETECTED
Coronavirus OC43: NOT DETECTED
INFLUENZA B-RVPPCR: NOT DETECTED
Influenza A: NOT DETECTED
Metapneumovirus: NOT DETECTED
Mycoplasma pneumoniae: NOT DETECTED
PARAINFLUENZA VIRUS 2-RVPPCR: NOT DETECTED
PARAINFLUENZA VIRUS 3-RVPPCR: NOT DETECTED
Parainfluenza Virus 1: NOT DETECTED
Parainfluenza Virus 4: NOT DETECTED
RESPIRATORY SYNCYTIAL VIRUS-RVPPCR: DETECTED — AB
RHINOVIRUS / ENTEROVIRUS - RVPPCR: NOT DETECTED

## 2017-04-06 LAB — BASIC METABOLIC PANEL
ANION GAP: 10 (ref 5–15)
BUN: 7 mg/dL (ref 6–20)
CO2: 25 mmol/L (ref 22–32)
Calcium: 11.5 mg/dL — ABNORMAL HIGH (ref 8.9–10.3)
Chloride: 102 mmol/L (ref 101–111)
Creatinine, Ser: 0.54 mg/dL (ref 0.44–1.00)
GFR calc Af Amer: >60 mL/min (ref >60)
GFR calc non Af Amer: >60 mL/min (ref >60)
GLUCOSE: 137 mg/dL — AB (ref 65–99)
POTASSIUM: 4.2 mmol/L (ref 3.5–5.1)
Sodium: 137 mmol/L (ref 135–145)

## 2017-04-06 LAB — CBC WITH DIFFERENTIAL/PLATELET
Basophils Absolute: 0 10*3/uL (ref 0.0–0.1)
Basophils Relative: 0 %
Eosinophils Absolute: 0 10*3/uL (ref 0.0–0.7)
Eosinophils Relative: 0 %
HEMATOCRIT: 37.5 % (ref 36.0–46.0)
Hemoglobin: 12.7 g/dL (ref 12.0–15.0)
LYMPHS ABS: 1.3 10*3/uL (ref 0.7–4.0)
LYMPHS PCT: 7 %
MCH: 31.7 pg (ref 26.0–34.0)
MCHC: 33.9 g/dL (ref 30.0–36.0)
MCV: 93.5 fL (ref 78.0–100.0)
MONO ABS: 1.8 10*3/uL — AB (ref 0.1–1.0)
MONOS PCT: 10 %
NEUTROS ABS: 15.3 10*3/uL — AB (ref 1.7–7.7)
Neutrophils Relative %: 83 %
Platelets: 298 10*3/uL (ref 150–400)
RBC: 4.01 MIL/uL (ref 3.87–5.11)
RDW: 12.1 % (ref 11.5–15.5)
WBC: 18.4 10*3/uL — ABNORMAL HIGH (ref 4.0–10.5)

## 2017-04-06 LAB — URINALYSIS, ROUTINE W REFLEX MICROSCOPIC
BILIRUBIN URINE: NEGATIVE
Glucose, UA: NEGATIVE mg/dL
Hgb urine dipstick: NEGATIVE
Ketones, ur: NEGATIVE mg/dL
Leukocytes, UA: NEGATIVE
NITRITE: NEGATIVE
PH: 6 (ref 5.0–8.0)
Protein, ur: NEGATIVE mg/dL
SPECIFIC GRAVITY, URINE: 1.026 (ref 1.005–1.030)

## 2017-04-06 LAB — PHOSPHORUS: PHOSPHORUS: 2 mg/dL — AB (ref 2.5–4.6)

## 2017-04-06 LAB — MAGNESIUM: MAGNESIUM: 1.9 mg/dL (ref 1.7–2.4)

## 2017-04-06 LAB — GLUCOSE, CAPILLARY: Glucose-Capillary: 126 mg/dL — ABNORMAL HIGH (ref 65–99)

## 2017-04-06 LAB — TROPONIN I
Troponin I: 0.16 ng/mL (ref <0.03)
Troponin I: 0.25 ng/mL (ref <0.03)
Troponin I: 0.14 ng/mL (ref <0.03)

## 2017-04-06 LAB — HIV ANTIBODY (ROUTINE TESTING W REFLEX): HIV Screen 4th Generation wRfx: NONREACTIVE

## 2017-04-06 MED ORDER — LEVALBUTEROL HCL 0.63 MG/3ML IN NEBU
0.6300 mg | INHALATION_SOLUTION | Freq: Three times a day (TID) | RESPIRATORY_TRACT | Status: DC
  Administered 2017-04-07: 0.63 mg via RESPIRATORY_TRACT
  Filled 2017-04-06: qty 3

## 2017-04-06 MED ORDER — GUAIFENESIN ER 600 MG PO TB12
1200.0000 mg | ORAL_TABLET | Freq: Two times a day (BID) | ORAL | Status: DC
  Administered 2017-04-06 – 2017-04-08 (×5): 1200 mg via ORAL
  Filled 2017-04-06 (×5): qty 2

## 2017-04-06 MED ORDER — SODIUM CHLORIDE 0.9% FLUSH: 3.0000 mL | INTRAVENOUS | Status: DC | PRN

## 2017-04-06 MED ORDER — SIMVASTATIN 40 MG PO TABS
40.0000 mg | ORAL_TABLET | Freq: Every day | ORAL | Status: DC
  Administered 2017-04-06 – 2017-04-07 (×2): 40 mg via ORAL
  Filled 2017-04-06 (×3): qty 1

## 2017-04-06 MED ORDER — ENOXAPARIN SODIUM 80 MG/0.8ML ~~LOC~~ SOLN
75.0000 mg | SUBCUTANEOUS | Status: DC
  Administered 2017-04-07 – 2017-04-08 (×2): 75 mg via SUBCUTANEOUS
  Filled 2017-04-06 (×2): qty 0.8

## 2017-04-06 MED ORDER — HYDROCODONE-ACETAMINOPHEN 5-325 MG PO TABS
1.0000 | ORAL_TABLET | ORAL | Status: DC | PRN
  Administered 2017-04-06 – 2017-04-07 (×2): 1 via ORAL
  Administered 2017-04-07 – 2017-04-08 (×2): 2 via ORAL
  Filled 2017-04-06 (×2): qty 1
  Filled 2017-04-06 (×2): qty 2

## 2017-04-06 MED ORDER — LEVALBUTEROL HCL 0.63 MG/3ML IN NEBU
0.6300 mg | INHALATION_SOLUTION | Freq: Four times a day (QID) | RESPIRATORY_TRACT | Status: DC
  Administered 2017-04-06 (×3): 0.63 mg via RESPIRATORY_TRACT
  Filled 2017-04-06 (×3): qty 3

## 2017-04-06 MED ORDER — LORATADINE 10 MG PO TABS
10.0000 mg | ORAL_TABLET | Freq: Every day | ORAL | Status: DC
  Administered 2017-04-06 – 2017-04-08 (×3): 10 mg via ORAL
  Filled 2017-04-06 (×3): qty 1

## 2017-04-06 MED ORDER — SODIUM CHLORIDE 0.9 % IV SOLN: 250.0000 mL | INTRAVENOUS | Status: DC | PRN

## 2017-04-06 MED ORDER — SENNOSIDES-DOCUSATE SODIUM 8.6-50 MG PO TABS
1.0000 | ORAL_TABLET | Freq: Every evening | ORAL | Status: DC | PRN
  Filled 2017-04-06: qty 1

## 2017-04-06 MED ORDER — ENALAPRIL MALEATE 20 MG PO TABS
20.0000 mg | ORAL_TABLET | Freq: Every day | ORAL | Status: DC
  Administered 2017-04-06 – 2017-04-08 (×3): 20 mg via ORAL
  Filled 2017-04-06 (×3): qty 1

## 2017-04-06 MED ORDER — GUAIFENESIN-DM 100-10 MG/5ML PO SYRP
5.0000 mL | ORAL_SOLUTION | ORAL | Status: DC | PRN
Start: 2017-04-06 — End: 2017-04-08
  Administered 2017-04-06 – 2017-04-07 (×2): 5 mL via ORAL
  Filled 2017-04-06 (×4): qty 5

## 2017-04-06 MED ORDER — CITALOPRAM HYDROBROMIDE 20 MG PO TABS: 20.0000 mg | ORAL_TABLET | Freq: Every day | ORAL | Status: DC

## 2017-04-06 MED ORDER — ONDANSETRON HCL 4 MG PO TABS: 4.0000 mg | ORAL_TABLET | Freq: Four times a day (QID) | ORAL | Status: DC | PRN

## 2017-04-06 MED ORDER — SODIUM CHLORIDE 0.9% FLUSH
3.0000 mL | Freq: Two times a day (BID) | INTRAVENOUS | Status: DC
  Administered 2017-04-06 – 2017-04-08 (×4): 3 mL via INTRAVENOUS

## 2017-04-06 MED ORDER — IPRATROPIUM BROMIDE 0.02 % IN SOLN
0.5000 mg | Freq: Three times a day (TID) | RESPIRATORY_TRACT | Status: DC
  Administered 2017-04-07: 0.5 mg via RESPIRATORY_TRACT
  Filled 2017-04-06: qty 2.5

## 2017-04-06 MED ORDER — CYCLOBENZAPRINE HCL 10 MG PO TABS: 10.0000 mg | ORAL_TABLET | Freq: Two times a day (BID) | ORAL | Status: DC | PRN

## 2017-04-06 MED ORDER — ESCITALOPRAM OXALATE 10 MG PO TABS
20.0000 mg | ORAL_TABLET | Freq: Every day | ORAL | Status: DC
  Administered 2017-04-06: 20 mg via ORAL
  Filled 2017-04-06: qty 2

## 2017-04-06 MED ORDER — ACETAMINOPHEN 325 MG PO TABS: 650.0000 mg | ORAL_TABLET | Freq: Four times a day (QID) | ORAL | Status: DC | PRN

## 2017-04-06 MED ORDER — SODIUM CHLORIDE 0.9% FLUSH
3.0000 mL | Freq: Two times a day (BID) | INTRAVENOUS | Status: DC
  Administered 2017-04-07 – 2017-04-08 (×2): 3 mL via INTRAVENOUS

## 2017-04-06 MED ORDER — GABAPENTIN 300 MG PO CAPS
300.0000 mg | ORAL_CAPSULE | Freq: Two times a day (BID) | ORAL | Status: DC
  Administered 2017-04-06 (×2): 300 mg via ORAL
  Filled 2017-04-06 (×2): qty 1

## 2017-04-06 MED ORDER — ENOXAPARIN SODIUM 40 MG/0.4ML ~~LOC~~ SOLN
40.0000 mg | SUBCUTANEOUS | Status: DC
  Administered 2017-04-06: 40 mg via SUBCUTANEOUS
  Filled 2017-04-06: qty 0.4

## 2017-04-06 MED ORDER — ASPIRIN 81 MG PO CHEW
81.0000 mg | CHEWABLE_TABLET | Freq: Every day | ORAL | Status: DC
  Administered 2017-04-06 – 2017-04-08 (×3): 81 mg via ORAL
  Filled 2017-04-06 (×3): qty 1

## 2017-04-06 MED ORDER — BISACODYL 5 MG PO TBEC: 5.0000 mg | DELAYED_RELEASE_TABLET | Freq: Every day | ORAL | Status: DC | PRN

## 2017-04-06 MED ORDER — GABAPENTIN 300 MG PO CAPS
300.0000 mg | ORAL_CAPSULE | Freq: Every day | ORAL | Status: DC
  Administered 2017-04-07: 300 mg via ORAL
  Filled 2017-04-06: qty 1

## 2017-04-06 MED ORDER — ONDANSETRON HCL 4 MG/2ML IJ SOLN: 4.0000 mg | Freq: Four times a day (QID) | INTRAMUSCULAR | Status: DC | PRN

## 2017-04-06 MED ORDER — DARIFENACIN HYDROBROMIDE ER 15 MG PO TB24
15.0000 mg | ORAL_TABLET | Freq: Every day | ORAL | Status: DC
  Administered 2017-04-06 – 2017-04-08 (×3): 15 mg via ORAL
  Filled 2017-04-06 (×3): qty 1

## 2017-04-06 MED ORDER — ACETAMINOPHEN 650 MG RE SUPP: 650.0000 mg | Freq: Four times a day (QID) | RECTAL | Status: DC | PRN

## 2017-04-06 MED ORDER — IPRATROPIUM BROMIDE 0.02 % IN SOLN
0.5000 mg | Freq: Four times a day (QID) | RESPIRATORY_TRACT | Status: DC
  Administered 2017-04-06 (×3): 0.5 mg via RESPIRATORY_TRACT
  Filled 2017-04-06 (×3): qty 2.5

## 2017-04-06 MED ORDER — SODIUM CHLORIDE 0.9 % IV SOLN
INTRAVENOUS | Status: DC
  Administered 2017-04-06: 75 mL/h via INTRAVENOUS
  Administered 2017-04-07 – 2017-04-08 (×3): via INTRAVENOUS

## 2017-04-06 MED ORDER — PANTOPRAZOLE SODIUM 40 MG PO TBEC
40.0000 mg | DELAYED_RELEASE_TABLET | Freq: Every day | ORAL | Status: DC
  Administered 2017-04-06 – 2017-04-08 (×3): 40 mg via ORAL
  Filled 2017-04-06 (×3): qty 1

## 2017-04-06 MED ORDER — ESCITALOPRAM OXALATE 10 MG PO TABS
10.0000 mg | ORAL_TABLET | Freq: Every day | ORAL | Status: DC
  Administered 2017-04-07 – 2017-04-08 (×2): 10 mg via ORAL
  Filled 2017-04-06 (×2): qty 1

## 2017-04-06 NOTE — Plan of Care (Signed)
  Progressing Clinical Measurements: Ability to maintain clinical measurements within normal limits will improve 04/06/2017 0915 - Progressing by Alonna Buckler, RN Will remain free from infection 04/06/2017 0915 - Progressing by Alonna Buckler, RN Diagnostic test results will improve 04/06/2017 0915 - Progressing by Alonna Buckler, RN Respiratory complications will improve 04/06/2017 0915 - Progressing by Alonna Buckler, RN Cardiovascular complication will be avoided 04/06/2017 0915 - Progressing by Alonna Buckler, RN Spiritual Needs Ability to function at adequate level 04/06/2017 0915 - Progressing by Alonna Buckler, RN

## 2017-04-06 NOTE — Progress Notes (Signed)
Lab result called and Respiratory Panel showed + result for RSV.  Text page sent to Dr. Alfredia Ferguson.

## 2017-04-06 NOTE — Progress Notes (Signed)
Pt and family instructed on the use of flutter valve and incentive spirometer. Pt able to demonstrate back good technique with both devices.

## 2017-04-06 NOTE — Progress Notes (Signed)
Patient educated on Incentive Spirometry and use of Flutter Valve.  Taught using teach-back and return demonstration

## 2017-04-06 NOTE — Progress Notes (Signed)
Troponin 0.16. Patient without complains of chest pain 0/10. Text paged NP on call [place] with results.  Patient is comfortably resting.  Will continue to monitor.  Tyniya Kuyper, RN

## 2017-04-06 NOTE — Progress Notes (Signed)
PROGRESS NOTE    VERCIE POKORNY  JXB:[id number] DOB: 09-Sep-1960 DOA: 04/05/2017 PCP: Deborah Chalk, FNP   Brief Narrative:  Ellen [name] is a 57 y.o. female with medical history significant for hypertension, hyperlipidemia, anxiety, and obesity, now presenting to the emergency department for evaluation of generalized aches, cough, congestion, sore throat, fevers, nausea, and one episode of nonbloody vomiting.  Patient reported that she had been in her usual state of health until approximately 5 days ago when the aforementioned symptoms began.  Symptoms have continued to worsen.  She denies abdominal pain chest pain, headache, or neck stiffness.Marland Kitchen She was admitted to the telemetry unit for ongoing evaluation and management of influenza-like illness with hypoxia and elevated troponin without angina. Still remains on O2 and ECHO Pending.   Assessment & Plan:   Principal Problem:   Acute hypoxemic respiratory failure (HCC) Active Problems:   Obesity, morbid, BMI 50 or higher (HCC)   Essential hypertension   SIRS (systemic inflammatory response syndrome) (HCC)   Elevated troponin   Hyponatremia   Anxiety   Acute respiratory failure with hypoxia (HCC)  Acute Respiratory Failure with Hypoxia 2/2 to RSV Infection -Presented with influenza-like illness; Gas Hx of Bronchitis  -Reportedly had saturation in 80's ED  -CXR clear, Influenza Negative -Respiratory Virus Panel Positive for RSV -Tachycardic initially, but no CP, leg tenderness and tachycardia resolved with IVF in ED   -Troponin elevated and Continued to Rise and peaked 0.25 -Echo ordered to assess for Heart Failure and Valvular Disease   -Continue supplemental O2 as needed and Wean O2 as tolerated; Patient continues to Require 2 Liters of O2 via [place] -Started Xopenex/Atrovent q6h and added Muicinex 1,200 mg po BID -Stopped Tamiflu as Influenza Negative -PT/OT Evaluate and Treat -Will need Walk Screen prior to D/C to assess O2  Needs -Added Flutter Valve, Incentive Spirometry  -Hold Abx for now; Patient was Febrile Yesterday   SIRS from RSV Infection  -Presented with fever, aches, cough, congestion, N/V, and sore throat  -CXR clear, influenza PCR negative  -Abdominal exam benign; no meningismus; no rash or wounds  -She complains of bilateral flank discomfort but denies dysuria  -Treated with an empiric dose of Tamiflu, Rocephin, and vanc in ED, but will hold Abx today and Reassess -Respiratory Virus Panel Positive for RSV -U/A Negative; Urine Cx Pending   Elevated Troponin -Troponin is elevated to 0.14 without chest pain -Troponin Trended from 0.14 -> 0.14 -> 0.16 -> 0.25 -> 0.14 -No known CAD  -Treated in ED with ASA 324 mg; C/w 81 mg po Daily  -Continue Cardiac Monitoring with Telemetry  -Checking Echocardiogram and pending    -Repeat EKG in AM -If significantly trending upward will Discuss and Consult Cardiology   Leukocytosis -Worsening. WBC went from 14.1 -> 18.4 -? Steroid Demargination from Decadron given in ED -Continue to Monitor for S/Sx of Infection -Repeat CBC in AM   Hypertension  -BP at goal  -C/w Enalapril 20 mg po Daily   Depression and Anxiety -Stable. No SI/HI -C/w Escitalopram 10 mg po Daily   Obesity, BMI >50  -Hold/Stop phentermine in light of elevated Troponin -Weight Loss Counseling given   Hypercalcemia -Unclear Etiology -Ca2+ was 10.4 on admission and went to 11.5 -Will Check PTH, Ionized Ca2+, and Vitamin D Level -Start IVF Rehydration with NS at 75 mL/hr; Given 1 Liter IVF Bolus yesterday   Hypophosphatemia -Patient's Phos Level was 2.0 -Replete with KPhos 500 mg BID -Continue to Monitor and Replete  as Necessary -Repeat CMP in AM   Hx of DJD -C/w Cyclobenzaprine 10 mg po BIDprn, Gabapentin 300 mg po Daily, and Hydrocodone-Acetaminophen 1-2 tab po q4hprn  GERD -C/w Pantoprazole 40 mg po Daily  HLD -C/w Simvastatin 40 mg po qHS  DVT prophylaxis:  Enoxaparin 75 mg sq q24h Code Status: FULL CODE Family Communication: Husband at bedside Disposition Plan: Anticipate D/C in Next 24-48 hours if improving  Consultants:   None   Procedures:  ECHOCARDIOGRAM (ordered and pending)    Antimicrobials:  Anti-infectives (From admission, onward)   Start     Dose/Rate Route Frequency Ordered Stop   04/05/17 1715  oseltamivir (TAMIFLU) capsule 75 mg     75 mg Oral  Once 04/05/17 1705 04/05/17 1746   04/05/17 1245  vancomycin (VANCOCIN) IVPB 1000 mg/200 mL premix     1,000 mg 200 mL/hr over 60 Minutes Intravenous  Once 04/05/17 1243 04/05/17 1445   04/05/17 1215  cefTRIAXone (ROCEPHIN) 2 g in sodium chloride 0.9 % 100 mL IVPB     2 g 200 mL/hr over 30 Minutes Intravenous  Once 04/05/17 1200 04/05/17 1334   04/05/17 1215  vancomycin (VANCOCIN) 2,000 mg in sodium chloride 0.9 % 500 mL IVPB  Status:  Discontinued     2,000 mg 250 mL/hr over 120 Minutes Intravenous  Once 04/05/17 1203 04/05/17 1243     Subjective: Seen and examined at bedside and was still feeling SOB. No nausea, vomiting, or CP. States she has a cough but not able to expectorate much and had some soreness in sides from coughing. No lightheadedness or dizziness.   Objective: Vitals:   04/05/17 2032 04/05/17 2100 04/05/17 2309 04/06/17 0528  BP:  (!) 146/97 (!) 149/91 (!) 152/88  Pulse:  93 98 97  Resp:  14 16 18   Temp: 98.7 F (37.1 C)  98.6 F (37 C) 99 F (37.2 C)  TempSrc: Oral  Oral Oral  SpO2:  97% 95% 97%  Weight:   (!) 157.9 kg (348 lb 3.2 oz) (!) 157.7 kg (347 lb 11.2 oz)  Height:   5\' 7"  (1.702 m)     Intake/Output Summary (Last 24 hours) at 04/06/2017 0745 Last data filed at 04/06/2017 0938 Gross per 24 hour  Intake 1120 ml  Output 350 ml  Net 770 ml   Filed Weights   04/05/17 1058 04/05/17 2309 04/06/17 0528  Weight: (!) 158.8 kg (350 lb) (!) 157.9 kg (348 lb 3.2 oz) (!) 157.7 kg (347 lb 11.2 oz)   Examination: Physical Exam:  Constitutional:  WN/WD morbidly obese AAF in NAD and appears calm  Eyes: Lids and conjunctivae normal, sclerae anicteric  ENMT: External Ears, Nose appear normal. Grossly normal hearing. Mucous membranes are moist. Neck: Appears normal, supple, no cervical masses, normal ROM, no appreciable thyromegaly, no  Respiratory: Diminished to auscultation bilaterally with mild rhonchi. Normal respiratory effort and patient is not tachypenic but is wearing supplemental O2 via [place] Cardiovascular: Slightly tachycardic, no appreciable murmurs / rubs / gallops. S1 and S2 auscultated. No extremity edema.  Abdomen: Soft, non-tender, Distended due to body habitus. No masses palpated. No appreciable hepatosplenomegaly. Bowel sounds positive x4.  GU: Deferred. Musculoskeletal: No clubbing / cyanosis of digits/nails. No joint deformity upper and lower extremities. Good ROM, no contractures.  Skin: No rashes, lesions, ulcers on a limited skin eval. No induration; Warm and dry.  Neurologic: CN 2-12 grossly intact with no focal deficits.Romberg sign and cerebellar reflexes not assessed.  Psychiatric: Normal judgment  and insight. Alert and oriented x 3. Normal mood and appropriate affect.   Data Reviewed: I have personally reviewed following labs and imaging studies  CBC: Recent Labs  Lab 04/05/17 1135  WBC 14.1*  NEUTROABS 10.4*  HGB 13.7  HCT 40.2  MCV 92.0  PLT 580   Basic Metabolic Panel: Recent Labs  Lab 04/05/17 1135  NA 132*  K 3.9  CL 98*  CO2 25  GLUCOSE 129*  BUN 9  CREATININE 0.67  CALCIUM 10.4*   GFR: Estimated Creatinine Clearance: 122.5 mL/min (by C-G formula based on SCr of 0.67 mg/dL). Liver Function Tests: Recent Labs  Lab 04/05/17 1135  AST 23  ALT 19  ALKPHOS 100  BILITOT 0.6  PROT 7.8  ALBUMIN 3.5   No results for input(s): LIPASE, AMYLASE in the last 168 hours. No results for input(s): AMMONIA in the last 168 hours. Coagulation Profile: No results for input(s): INR, PROTIME in the  last 168 hours. Cardiac Enzymes: Recent Labs  Lab 04/05/17 1135 04/05/17 1744 04/06/17 0055  TROPONINI 0.14* 0.14* 0.16*   BNP (last 3 results) No results for input(s): PROBNP in the last 8760 hours. HbA1C: No results for input(s): HGBA1C in the last 72 hours. CBG: Recent Labs  Lab 04/06/17 0714  GLUCAP 126*   Lipid Profile: No results for input(s): CHOL, HDL, LDLCALC, TRIG, CHOLHDL, LDLDIRECT in the last 72 hours. Thyroid Function Tests: No results for input(s): TSH, T4TOTAL, FREET4, T3FREE, THYROIDAB in the last 72 hours. Anemia Panel: No results for input(s): VITAMINB12, FOLATE, FERRITIN, TIBC, IRON, RETICCTPCT in the last 72 hours. Sepsis Labs: Recent Labs  Lab 04/05/17 1227  LATICACIDVEN 1.00    No results found for this or any previous visit (from the past 240 hour(s)).   Radiology Studies: Dg Chest 2 View  Result Date: 04/05/2017 CLINICAL DATA:  Cough, fever, congestion EXAM: CHEST  2 VIEW COMPARISON:  01/17/2017 FINDINGS: The heart size and mediastinal contours are within normal limits. Both lungs are clear. The visualized skeletal structures are unremarkable. IMPRESSION: No active cardiopulmonary disease. Electronically Signed   By: Kathreen Devoid   On: 04/05/2017 12:55   Ct Head Wo Contrast  Result Date: 04/05/2017 CLINICAL DATA:  Altered mental status.  Leg weakness. EXAM: CT HEAD WITHOUT CONTRAST TECHNIQUE: Contiguous axial images were obtained from the base of the skull through the vertex without intravenous contrast. COMPARISON:  CT head dated May 11, 2011. FINDINGS: Brain: No evidence of acute infarction, hemorrhage, hydrocephalus, extra-axial collection or mass lesion/mass effect. Unchanged dense calcification along the tentorium and falx. Vascular: No hyperdense vessel or unexpected calcification. Skull: Normal. Negative for fracture or focal lesion. Sinuses/Orbits: Mild paranasal sinus mucosal thickening. Clear mastoid air cells. No acute orbital  abnormality. Other: None. IMPRESSION: 1.  No acute intracranial abnormality. Electronically Signed   By: Titus Dubin M.D.   On: 04/05/2017 13:00   Scheduled Meds: . aspirin  81 mg Oral Daily  . darifenacin  15 mg Oral Daily  . enalapril  20 mg Oral Daily  . enoxaparin (LOVENOX) injection  40 mg Subcutaneous Q24H  . escitalopram  20 mg Oral Daily  . gabapentin  300 mg Oral BID  . loratadine  10 mg Oral Daily  . pantoprazole  40 mg Oral Daily  . simvastatin  40 mg Oral q1800  . sodium chloride flush  3 mL Intravenous Q12H  . sodium chloride flush  3 mL Intravenous Q12H   Continuous Infusions: . sodium chloride  LOS: 1 day   Kerney Elbe, DO [facility] Pager [phone]  If 7PM-7AM, please contact night-coverage [email or web address] Password [facility] 04/06/2017, 7:45 AM

## 2017-04-06 NOTE — Progress Notes (Signed)
  Echocardiogram 2D Echocardiogram has been performed.  Ellen [name] 04/06/2017, 3:43 PM

## 2017-04-06 NOTE — H&P (Signed)
History and Physical    Ellen [name] HTD:[id number] DOB: 1960-11-14 DOA: 04/05/2017  PCP: Deborah Chalk, FNP   Patient coming from: Home, by way of [facility]   Chief Complaint: Aches, cough, congestion, sore throat, N/V, fever   HPI: Ellen [name] is a 57 y.o. female with medical history significant for hypertension, hyperlipidemia, anxiety, and obesity, now presenting to the emergency department for evaluation of generalized aches, cough, congestion, sore throat, fevers, nausea, and one episode of nonbloody vomiting.  Patient reports that she had been in her usual state of health until approximately 5 days ago when the aforementioned symptoms began.  Symptoms have continued to worsen.  She denies abdominal pain chest pain, headache, or neck stiffness.  [facility] ED Course: Upon arrival to the ED, patient is found to be febrile to 39.6 C, saturating well on 2 L/min of supplemental oxygen, tachycardic to 130, and with stable blood pressure.  EKG features a sinus tachycardia with rate 126 and chest x-rays negative for acute cardiopulmonary disease.  Head CT is negative for acute intracranial abnormality.  Chemistry panel is notable for a mild hyponatremia and CBC features a leukocytosis to 14,100.  Acid is reassuring at 1.00.  Influenza PCR is negative.  Troponin is elevated to 0.14, and again 0.14 six hours later.  Blood cultures were collected, 324 mg of aspirin was given, 2 L of normal saline administered, and patient was treated with empiric Tamiflu, Rocephin, and vancomycin.  Tachycardia resolved with the IV fluids, she remains hemodynamically stable, and will be admitted to the telemetry unit for ongoing evaluation and management of influenza-like illness with hypoxia and elevated troponin without angina.  Review of Systems:  All other systems reviewed and apart from HPI, are negative.  Past Medical History:  Diagnosis Date  . Bronchitis    hx of  . Degenerative joint  disease   . Depression   . ENDOMETRIAL POLYP 01/08/2006   Dr. Agnes Lawrence.  2001:  D&C, benign;  07/14/10:  D&C, hysteroscopy, polypectomy and bx of mons pubis (peau d'orange)...... 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  Marland Kitchen GERD (gastroesophageal reflux disease)   . Hernia   . High cholesterol   . Hypertension   . Lipoma   . Obesity     Past Surgical History:  Procedure Laterality Date  . CHOLECYSTECTOMY    . DILATION AND CURETTAGE OF UTERUS    . resection of lipoma    . UMBILICAL HERNIA REPAIR       reports that  has never smoked. she has never used smokeless tobacco. She reports that she does not drink alcohol or use drugs.  Allergies  Allergen Reactions  . Codeine Itching    Only happened one time. Did not occur last time patient was in the hospital.    History reviewed. No pertinent family history.   Prior to Admission medications   Medication Sig Start Date End Date Taking? Authorizing Provider  albuterol (PROVENTIL HFA;VENTOLIN HFA) 108 (90 Base) MCG/ACT inhaler Inhale 2 puffs into the lungs every 4 (four) hours as needed for wheezing or shortness of breath. 11/29/15   Duffy Bruce, MD  aspirin (BL ADULT ASPIRIN LOW STRENGTH) 81 MG chewable tablet Chew 81 mg by mouth daily.      [provider]  cetirizine (ZYRTEC) 10 MG tablet TAKE 1 TABLET BY MOUTH ONCE A DAY 07/01/10   Alphia Moh, MD  ciclopirox (LOPROX) 0.77 % cream Apply topically. 03/09/15  [provider]  citalopram (CELEXA) 20 MG tablet  06/04/12   [provider]  cyclobenzaprine (FLEXERIL) 10 MG tablet Take 1 tablet (10 mg total) by mouth 2 (two) times daily as needed for muscle spasms. 02/10/17   Long, Wonda Olds, MD  enalapril (VASOTEC) 20 MG tablet Take 20 mg by mouth daily. Take tablets by mouth once a day     [provider]  escitalopram (LEXAPRO) 20 MG tablet Take 20 mg by mouth. 02/03/15   [provider]  gabapentin  (NEURONTIN) 300 MG capsule Take 300 mg by mouth. 12/21/14   [provider]  omeprazole (PRILOSEC) 40 MG capsule Take 40 mg by mouth. 12/17/14   [provider]  phentermine (ADIPEX-P) 37.5 MG tablet Take 37.5 mg by mouth daily before breakfast.    [provider]  simvastatin (ZOCOR) 40 MG tablet Take 40 mg by mouth. 02/03/15   [provider]  solifenacin (VESICARE) 10 MG tablet Take 10 mg by mouth. 12/25/14   [provider]  fluticasone (FLONASE) 50 MCG/ACT nasal spray Place 2 sprays into the nose daily. Apply 2 sprays in each nostril at bedtime   05/11/11  [provider]    Physical Exam: Vitals:   04/05/17 2030 04/05/17 2032 04/05/17 2100 04/05/17 2309  BP: 140/89  (!) 146/97 (!) 149/91  Pulse: 100  93 98  Resp: 13  14 16   Temp:  98.7 F (37.1 C)  98.6 F (37 C)  TempSrc:  Oral  Oral  SpO2: 97%  97% 95%  Weight:    (!) 157.9 kg (348 lb 3.2 oz)  Height:    5\' 7"  (1.702 m)      Constitutional: NAD, calm, obese Eyes: PERTLA, lids and conjunctivae normal ENMT: Mucous membranes are moist. Posterior pharynx clear of any exudate or lesions.   Neck: normal, supple, no masses, no thyromegaly Respiratory: Breath sounds diminished bilaterally, no wheezing, no crackles. Normal respiratory effort.   Cardiovascular: S1 & S2 heard, regular rate and rhythm. No significant JVD. Abdomen: No distension, no tenderness, no masses palpated. Bowel sounds normal.  Musculoskeletal: no clubbing / cyanosis. No joint deformity upper and lower extremities.   Skin: no significant rashes, lesions, ulcers. Warm, dry, well-perfused. Neurologic: CN 2-12 grossly intact. Sensation intact. Strength 5/5 in all 4 limbs.  Psychiatric: Alert and oriented x 3. Pleasant and cooperative.     Labs on Admission: I have personally reviewed following labs and imaging studies  CBC: Recent Labs  Lab 04/05/17 1135  WBC 14.1*  NEUTROABS 10.4*  HGB 13.7  HCT 40.2    MCV 92.0  PLT 161   Basic Metabolic Panel: Recent Labs  Lab 04/05/17 1135  NA 132*  K 3.9  CL 98*  CO2 25  GLUCOSE 129*  BUN 9  CREATININE 0.67  CALCIUM 10.4*   GFR: Estimated Creatinine Clearance: 122.6 mL/min (by C-G formula based on SCr of 0.67 mg/dL). Liver Function Tests: Recent Labs  Lab 04/05/17 1135  AST 23  ALT 19  ALKPHOS 100  BILITOT 0.6  PROT 7.8  ALBUMIN 3.5   No results for input(s): LIPASE, AMYLASE in the last 168 hours. No results for input(s): AMMONIA in the last 168 hours. Coagulation Profile: No results for input(s): INR, PROTIME in the last 168 hours. Cardiac Enzymes: Recent Labs  Lab 04/05/17 1135 04/05/17 1744  TROPONINI 0.14* 0.14*   BNP (last 3 results) No results for input(s): PROBNP in the last 8760 hours. HbA1C: No  results for input(s): HGBA1C in the last 72 hours. CBG: No results for input(s): GLUCAP in the last 168 hours. Lipid Profile: No results for input(s): CHOL, HDL, LDLCALC, TRIG, CHOLHDL, LDLDIRECT in the last 72 hours. Thyroid Function Tests: No results for input(s): TSH, T4TOTAL, FREET4, T3FREE, THYROIDAB in the last 72 hours. Anemia Panel: No results for input(s): VITAMINB12, FOLATE, FERRITIN, TIBC, IRON, RETICCTPCT in the last 72 hours. Urine analysis:    Component Value Date/Time   COLORURINE AMBER (A) 06/14/2011 1828   APPEARANCEUR CLOUDY (A) 06/14/2011 1828   LABSPEC 1.025 06/14/2011 1828   PHURINE 6.5 06/14/2011 1828   GLUCOSEU NEGATIVE 06/14/2011 1828   GLUCOSEU NEG mg/dL 08/05/2006 2022   HGBUR NEGATIVE 06/14/2011 1828   BILIRUBINUR NEGATIVE 06/14/2011 1828   KETONESUR 15 (A) 06/14/2011 1828   PROTEINUR NEGATIVE 06/14/2011 1828   UROBILINOGEN 1.0 06/14/2011 1828   NITRITE NEGATIVE 06/14/2011 1828   LEUKOCYTESUR NEGATIVE 06/14/2011 1828   Sepsis Labs: @LABRCNTIP (procalcitonin:4,lacticidven:4) )No results found for this or any previous visit (from the past 240 hour(s)).   Radiological Exams on  Admission: Dg Chest 2 View  Result Date: 04/05/2017 CLINICAL DATA:  Cough, fever, congestion EXAM: CHEST  2 VIEW COMPARISON:  01/17/2017 FINDINGS: The heart size and mediastinal contours are within normal limits. Both lungs are clear. The visualized skeletal structures are unremarkable. IMPRESSION: No active cardiopulmonary disease. Electronically Signed   By: Kathreen Devoid   On: 04/05/2017 12:55   Ct Head Wo Contrast  Result Date: 04/05/2017 CLINICAL DATA:  Altered mental status.  Leg weakness. EXAM: CT HEAD WITHOUT CONTRAST TECHNIQUE: Contiguous axial images were obtained from the base of the skull through the vertex without intravenous contrast. COMPARISON:  CT head dated May 11, 2011. FINDINGS: Brain: No evidence of acute infarction, hemorrhage, hydrocephalus, extra-axial collection or mass lesion/mass effect. Unchanged dense calcification along the tentorium and falx. Vascular: No hyperdense vessel or unexpected calcification. Skull: Normal. Negative for fracture or focal lesion. Sinuses/Orbits: Mild paranasal sinus mucosal thickening. Clear mastoid air cells. No acute orbital abnormality. Other: None. IMPRESSION: 1.  No acute intracranial abnormality. Electronically Signed   By: Titus Dubin M.D.   On: 04/05/2017 13:00    EKG: Independently reviewed. Sinus tachycardia (rate 126).  Assessment/Plan   1. Acute hypoxic respiratory failure  - Presents with influenza-like illness  - Reportedly had saturation in 80's ED  - CXR clear, influenza negative  - Tachycardic initially, but no CP, leg tenderness and tachycardia resolved with IVF in ED   - Check respiratory virus panel, continue droplet precautions  - Troponin elevated and echo ordered  - Continue supplemental O2 as needed, supportive care   2. SIRS  - Presents with fever, aches, cough, congestion, N/V, and sore throat  - CXR clear, influenza PCR negative  - Abdominal exam benign; no meningismus; no rash or wounds  - She  complains of bilateral flank discomfort but denies dysuria  - Treated with an empiric dose of Tamiflu, Rocephin, and vanc in ED  - Seems to be an acute viral URI  - Check UA, respiratory virus panel    3. Elevated troponin - Troponin is elevated to 0.14 without chest pain - Second troponin also 0.14 six hours later  - No known CAD  - Treated in ED with ASA 324 mg  - Continue cardiac monitoring, trend troponin, check echocardiogram    4. Hypertension  - BP at goal  - Continue enalapril   5. Anxiety disorder - Stable, continue Celexa and  Lexapro    6. Obesity, BMI >50  - Hold/stop phentermine in light of elevated troponin     DVT prophylaxis: Lovenox Code Status: Full  Family Communication: Discussed with patient Disposition Plan: Admit to telemetry Consults called: None Admission status: Inpatient    Vianne Bulls, MD [facility] Pager [phone]  If 7PM-7AM, please contact night-coverage [email or web address] Password [facility]  04/06/2017, 12:37 AM

## 2017-04-06 NOTE — Progress Notes (Signed)
2D Echo in progress

## 2017-04-07 ENCOUNTER — Inpatient Hospital Stay (HOSPITAL_COMMUNITY): Payer: Medicare Other

## 2017-04-07 DIAGNOSIS — E871 Hypo-osmolality and hyponatremia: Secondary | ICD-10-CM

## 2017-04-07 LAB — COMPREHENSIVE METABOLIC PANEL
ALT: 35 U/L (ref 14–54)
AST: 30 U/L (ref 15–41)
Albumin: 2.5 g/dL — ABNORMAL LOW (ref 3.5–5.0)
Alkaline Phosphatase: 88 U/L (ref 38–126)
Anion gap: 7 (ref 5–15)
BILIRUBIN TOTAL: 0.6 mg/dL (ref 0.3–1.2)
BUN: 9 mg/dL (ref 6–20)
CHLORIDE: 104 mmol/L (ref 101–111)
CO2: 28 mmol/L (ref 22–32)
CREATININE: 0.59 mg/dL (ref 0.44–1.00)
Calcium: 11 mg/dL — ABNORMAL HIGH (ref 8.9–10.3)
GFR calc Af Amer: >60 mL/min (ref >60)
GLUCOSE: 112 mg/dL — AB (ref 65–99)
Potassium: 4.6 mmol/L (ref 3.5–5.1)
Sodium: 139 mmol/L (ref 135–145)
Total Protein: 6.4 g/dL — ABNORMAL LOW (ref 6.5–8.1)

## 2017-04-07 LAB — CBC WITH DIFFERENTIAL/PLATELET
Basophils Absolute: 0 10*3/uL (ref 0.0–0.1)
Basophils Relative: 0 %
EOS PCT: 0 %
Eosinophils Absolute: 0 10*3/uL (ref 0.0–0.7)
HEMATOCRIT: 36.8 % (ref 36.0–46.0)
Hemoglobin: 11.9 g/dL — ABNORMAL LOW (ref 12.0–15.0)
Lymphocytes Relative: 18 %
Lymphs Abs: 2.6 10*3/uL (ref 0.7–4.0)
MCH: 30.7 pg (ref 26.0–34.0)
MCHC: 32.3 g/dL (ref 30.0–36.0)
MCV: 94.8 fL (ref 78.0–100.0)
MONOS PCT: 13 %
Monocytes Absolute: 1.9 10*3/uL — ABNORMAL HIGH (ref 0.1–1.0)
NEUTROS PCT: 69 %
Neutro Abs: 10 10*3/uL — ABNORMAL HIGH (ref 1.7–7.7)
PLATELETS: 282 10*3/uL (ref 150–400)
RBC: 3.88 MIL/uL (ref 3.87–5.11)
RDW: 12.5 % (ref 11.5–15.5)
WBC: 14.5 10*3/uL — AB (ref 4.0–10.5)

## 2017-04-07 LAB — GLUCOSE, CAPILLARY: Glucose-Capillary: 96 mg/dL (ref 65–99)

## 2017-04-07 LAB — URINE CULTURE: Culture: NO GROWTH

## 2017-04-07 LAB — MAGNESIUM: MAGNESIUM: 1.9 mg/dL (ref 1.7–2.4)

## 2017-04-07 LAB — PHOSPHORUS: Phosphorus: 2.5 mg/dL (ref 2.5–4.6)

## 2017-04-07 MED ORDER — MENTHOL 3 MG MT LOZG
1.0000 | LOZENGE | OROMUCOSAL | Status: DC | PRN
Start: 2017-04-07 — End: 2017-04-08
  Filled 2017-04-07: qty 9

## 2017-04-07 MED ORDER — IPRATROPIUM BROMIDE 0.02 % IN SOLN: 0.5000 mg | Freq: Four times a day (QID) | RESPIRATORY_TRACT | Status: DC | PRN

## 2017-04-07 MED ORDER — IPRATROPIUM BROMIDE 0.02 % IN SOLN
0.5000 mg | Freq: Two times a day (BID) | RESPIRATORY_TRACT | Status: DC
  Administered 2017-04-07 – 2017-04-08 (×2): 0.5 mg via RESPIRATORY_TRACT
  Filled 2017-04-07 (×2): qty 2.5

## 2017-04-07 MED ORDER — PHENOL 1.4 % MT LIQD
1.0000 | OROMUCOSAL | Status: DC | PRN
Start: 2017-04-07 — End: 2017-04-08

## 2017-04-07 MED ORDER — LEVALBUTEROL HCL 0.63 MG/3ML IN NEBU
0.6300 mg | INHALATION_SOLUTION | Freq: Two times a day (BID) | RESPIRATORY_TRACT | Status: DC
  Administered 2017-04-07 – 2017-04-08 (×2): 0.63 mg via RESPIRATORY_TRACT
  Filled 2017-04-07 (×2): qty 3

## 2017-04-07 MED ORDER — LEVALBUTEROL HCL 0.63 MG/3ML IN NEBU: 0.6300 mg | INHALATION_SOLUTION | Freq: Four times a day (QID) | RESPIRATORY_TRACT | Status: DC | PRN

## 2017-04-07 NOTE — Plan of Care (Signed)
  Health Behavior/Discharge Planning: Ability to manage health-related needs will improve 04/07/2017 1016 - Not Progressing by Imagene Gurney, RN

## 2017-04-07 NOTE — Progress Notes (Signed)
SATURATION QUALIFICATIONS: (This note is used to comply with regulatory documentation for home oxygen)  Patient Saturations on Room Air at Rest = 92%  Patient Saturations on Room Air while Ambulating = 95%  Patient Saturations on 0 Liters of oxygen while Ambulating = 95%  Please briefly explain why patient needs home oxygen:  Pt does not qualify for home O2.

## 2017-04-07 NOTE — Evaluation (Signed)
Occupational Therapy Evaluation Patient Details Name: Ellen [name] MRN: [id number] DOB: Sep 03, 1960 Today's Date: 04/07/2017    History of Present Illness Pt is Ellen 57 y.o. female with medical history significant for hypertension, hyperlipidemia, anxiety, and obesity presenting with influenza-like illness. She was admitted with acute hypoxic respiratory failure, elevated troponin, and SIRS.   Clinical Impression   PTA, pt was independent with ADL and functional mobility. She currently is able to complete ADL with overall supervision for safety in hospital setting. She presents with decreased activity tolerance for ADL and does become short of breath with activity but SpO2 96-97% throughout session on RA. Pt would benefit from continued OT services while admitted. Feel she will make good progress with acute OT and do not anticipate need for OT follow-up post-acute D/C. Will continue to follow.     Follow Up Recommendations  No OT follow up;Supervision - Intermittent    Equipment Recommendations  3 in 1 bedside commode    Recommendations for Other Services       Precautions / Restrictions Precautions Precautions: Fall Restrictions Weight Bearing Restrictions: No      Mobility Bed Mobility Overal bed mobility: Needs Assistance Bed Mobility: Supine to Sit;Sit to Supine     Supine to sit: Supervision Sit to supine: Supervision   General bed mobility comments: for safety  Transfers Overall transfer level: Needs assistance   Transfers: Sit to/from Stand Sit to Stand: Supervision         General transfer comment: for safety    Balance Overall balance assessment: Mild deficits observed, not formally tested                                         ADL either performed or assessed with clinical judgement   ADL Overall ADL's : Needs assistance/impaired Eating/Feeding: Modified independent   Grooming: Supervision/safety;Standing   Upper Body Bathing:  Modified independent   Lower Body Bathing: Supervison/ safety;Sit to/from stand   Upper Body Dressing : Modified independent   Lower Body Dressing: Supervision/safety;Sit to/from stand   Toilet Transfer: Supervision/safety;Ambulation;BSC Toilet Transfer Details (indicate cue type and reason): short distance in room to b[facility] Toileting- Clothing Manipulation and Hygiene: Supervision/safety;Sit to/from stand       Functional mobility during ADLs: Supervision/safety General ADL Comments: Limited activity tolerance for ADL.      Vision Baseline Vision/History: Wears glasses Wears Glasses: At all times Patient Visual Report: No change from baseline Vision Assessment?: No apparent visual deficits     Perception     Praxis      Pertinent Vitals/Pain Pain Assessment: No/denies pain     Hand Dominance     Extremity/Trunk Assessment Upper Extremity Assessment Upper Extremity Assessment: Overall WFL for tasks assessed   Lower Extremity Assessment Lower Extremity Assessment: Defer to PT evaluation       Communication Communication Communication: No difficulties   Cognition Arousal/Alertness: Lethargic Behavior During Therapy: WFL for tasks assessed/performed(initially dozing off but once mobilizing was alert) Overall Cognitive Status: Within Functional Limits for tasks assessed                                 General Comments: As pt was waking up, did provide unrelated answers to questions posed by therapist. However, as she became more awake, pt with cognition WFL.   General Comments  SpO2 96-97% throughout on RA    Exercises     Shoulder Instructions      Home Living Family/patient expects to be discharged to:: Private residence Living Arrangements: Other relatives(sisters; neice) Available Help at Discharge: Family;Available 24 hours/day               Bathroom Shower/Tub: Teacher, early years/pre: Standard     Home  Equipment: None          Prior Functioning/Environment Level of Independence: Independent        Comments: Reports she does not drive but is otherwise independent for ADL and IADL.        OT Problem List: Decreased activity tolerance;Impaired balance (sitting and/or standing);Decreased safety awareness;Decreased knowledge of precautions;Obesity      OT Treatment/Interventions: Self-care/ADL training;Therapeutic exercise;Energy conservation;DME and/or AE instruction;Therapeutic activities;Patient/family education;Balance training    OT Goals(Current goals can be found in the care plan section) Acute Rehab OT Goals Patient Stated Goal: back to "doing everything" OT Goal Formulation: With patient Time For Goal Achievement: 04/21/17 Potential to Achieve Goals: Good ADL Goals Pt Will Transfer to Toilet: (P) with modified independence;bedside commode Pt Will Perform Toileting - Clothing Manipulation and hygiene: (P) with modified independence;sit to/from stand Additional ADL Goal #1: (P) Pt will verbalize 3 strategies to conserve energy during her morning ADL routine.  OT Frequency: Min 2X/week   Barriers to D/C:            Co-evaluation              AM-PAC PT "6 Clicks" Daily Activity     Outcome Measure Help from another person eating meals?: None Help from another person taking care of personal grooming?: Ellen Little Help from another person toileting, which includes using toliet, bedpan, or urinal?: Ellen Little Help from another person bathing (including washing, rinsing, drying)?: Ellen Little Help from another person to put on and taking off regular upper body clothing?: None Help from another person to put on and taking off regular lower body clothing?: Ellen Little 6 Click Score: 20   End of Session Nurse Communication: Other (comment)(nurse tech: pt urinated)  Activity Tolerance: Patient tolerated treatment well Patient left: in bed;with call bell/phone within reach  OT  Visit Diagnosis: Other abnormalities of gait and mobility (R26.89);Muscle weakness (generalized) (M62.81)                Time: 6967-8938 OT Time Calculation (min): 12 min Charges:  OT General Charges $OT Visit: 1 Visit OT Evaluation $OT Eval Moderate Complexity: 1 Mod G-Codes:     Norman Herrlich, MS OTR/L  Pager: [place] Ellen [name] 04/07/2017, 11:31 AM

## 2017-04-07 NOTE — Progress Notes (Signed)
PROGRESS NOTE    Ellen [name]  DDU:[id number] DOB: [birth year] DOA: 04/05/2017 PCP: Deborah Chalk, FNP   Brief Narrative:  Ellen [name] is a 57 y.o. female with medical history significant for hypertension, hyperlipidemia, anxiety, and obesity, now presenting to the emergency department for evaluation of generalized aches, cough, congestion, sore throat, fevers, nausea, and one episode of nonbloody vomiting.  Patient reported that she had been in her usual state of health until approximately 5 days ago when the aforementioned symptoms began.  Symptoms have continued to worsen.  She denies abdominal pain chest pain, headache, or neck stiffness.Marland Kitchen She was admitted to the telemetry unit for ongoing evaluation and management of influenza-like illness with hypoxia and elevated troponin without angina. Still remains on O2 and ECHO Pending. Patient improving and anticipate D/C Home tomorrow   Assessment & Plan:   Principal Problem:   Acute hypoxemic respiratory failure (HCC) Active Problems:   Obesity, morbid, BMI 50 or higher (HCC)   Essential hypertension   SIRS (systemic inflammatory response syndrome) (HCC)   Elevated troponin   Hyponatremia   Anxiety   Acute respiratory failure with hypoxia (HCC)  Acute Respiratory Failure with Hypoxia 2/2 to RSV Infection, improved -Presented with influenza-like illness; Gas Hx of Bronchitis  -Reportedly had saturation in 80's ED; Now improved  -CXR this AM showed no acute cardiopulmonary disease and was clear, Influenza Negative -Respiratory Virus Panel Positive for RSV -Tachycardic initially, but no CP, leg tenderness and tachycardia resolved with IVF in ED   -Troponin elevated and Continued to Rise and peaked 0.25 but now 0.14 -Echo ordered to assess for Heart Failure and Valvular Disease and still pending read -Continue supplemental O2 as needed and Wean O2 as tolerated; Patient no longer requiring 2 Liters of O2 via [place] -Started  Xopenex/Atrovent q6h and added Muicinex 1,200 mg po BID -Stopped Tamiflu as Influenza Negative -PT/OT Evaluate and Treat and recommending no Follow Up -Walk Screen prior to D/C to assess O2 Needs and patient did not require O2 -Added Flutter Valve, Incentive Spirometry  -Continue to Hold Abx for now; Patient was Febrile on day of admission   SIRS from RSV Infection  -Presented with fever, aches, cough, congestion, N/V, and sore throat  -CXR clear, influenza PCR negative  -Abdominal exam benign; no meningismus; no rash or wounds  -She complains of bilateral flank discomfort but denies dysuria  -Treated with an empiric dose of Tamiflu, Rocephin, and vanc in ED, but will hold Abx today and Reassess -Respiratory Virus Panel Positive for RSV -U/A Negative; Urine Cx showed No Growth  Elevated Troponin -Troponin is elevated to 0.14 without chest pain -Troponin Trended from 0.14 -> 0.14 -> 0.16 -> 0.25 -> 0.14 -No known CAD  -Treated in ED with ASA 324 mg; C/w 81 mg po Daily  -Continue Cardiac Monitoring with Telemetry  -Checking Echocardiogram and pending read   -Repeat EKG in AM -If significantly trending upward will Discuss and Consult Cardiology   Leukocytosis -Worsening. WBC went from 14.1 -> 18.4 -> 14.5 -? Steroid Demargination from Decadron given in ED -Continue to Monitor for S/Sx of Infection -Repeat CBC in AM   Hypertension  -BP at goal  -C/w Enalapril 20 mg po Daily   Depression and Anxiety -Stable. No SI/HI -C/w Escitalopram 10 mg po Daily   Obesity, BMI >50  -Hold/Stop phentermine in light of elevated Troponin -Weight Loss Counseling given   Hypercalcemia -Unclear Etiology -Ca2+ was 10.4 on admission and went to 11.5 and  now 11.0 -Checking PTH, Ionized Ca2+, and Vitamin D Level and pending  -C/w IVF Rehydration with NS at 75 mL/hr; Given 1 Liter IVF Bolus yesterday   Hypophosphatemia -Patient's Phos Level was 2.0 and improved to 2.5 -Replete with KPhos  500 mg BID x2 yesterday  -Continue to Monitor and Replete as Necessary -Repeat CMP in AM   Hx of DJD -C/w Cyclobenzaprine 10 mg po BIDprn, Gabapentin 300 mg po Daily, and Hydrocodone-Acetaminophen 1-2 tab po q4hprn  GERD -C/w Pantoprazole 40 mg po Daily  HLD -C/w Simvastatin 40 mg po qHS  DVT prophylaxis: Enoxaparin 75 mg sq q24h Code Status: FULL CODE Family Communication: Husband at bedside but he was sleeping  Disposition Plan: Anticipate D/C in Next 24 hours pending ECHO results. PT/OT recommending no follow up but recommending 3 in 1 Bedside commode  Consultants:   None   Procedures:  ECHOCARDIOGRAM (ordered and pending)    Antimicrobials:  Anti-infectives (From admission, onward)   Start     Dose/Rate Route Frequency Ordered Stop   04/05/17 1715  oseltamivir (TAMIFLU) capsule 75 mg     75 mg Oral  Once 04/05/17 1705 04/05/17 1746   04/05/17 1245  vancomycin (VANCOCIN) IVPB 1000 mg/200 mL premix     1,000 mg 200 mL/hr over 60 Minutes Intravenous  Once 04/05/17 1243 04/05/17 1445   04/05/17 1215  cefTRIAXone (ROCEPHIN) 2 g in sodium chloride 0.9 % 100 mL IVPB     2 g 200 mL/hr over 30 Minutes Intravenous  Once 04/05/17 1200 04/05/17 1334   04/05/17 1215  vancomycin (VANCOCIN) 2,000 mg in sodium chloride 0.9 % 500 mL IVPB  Status:  Discontinued     2,000 mg 250 mL/hr over 120 Minutes Intravenous  Once 04/05/17 1203 04/05/17 1243     Subjective: Seen and examined at bedside and was feeling better. No CP and states she is coughing a little more. No longer needing O2 and no lightheadedness or dizziness.   Objective: Vitals:   04/07/17 0515 04/07/17 0559 04/07/17 0701 04/07/17 1200  BP: 129/64   121/67  Pulse: 73   81  Resp: 16   18  Temp: 98.4 F (36.9 C)   (!) 97.3 F (36.3 C)  TempSrc: Oral   Oral  SpO2: 95% 94%  99%  Weight:   (!) 157.9 kg (348 lb 3.2 oz)   Height:        Intake/Output Summary (Last 24 hours) at 04/07/2017 1841 Last data filed at  04/07/2017 1837 Gross per 24 hour  Intake 2461.25 ml  Output 2975 ml  Net -513.75 ml   Filed Weights   04/05/17 2309 04/06/17 0528 04/07/17 0701  Weight: (!) 157.9 kg (348 lb 3.2 oz) (!) 157.7 kg (347 lb 11.2 oz) (!) 157.9 kg (348 lb 3.2 oz)   Examination: Physical Exam:  Constitutional: WN/WD morbidly obese AAF who is calm and in NAD. Just awoken from sleep and is a little drowsy.  Eyes: Sclerae anicteric. Lids normal ENMT: External ears and nose appear normal. Neck: Supple with no appreciable JVD Respiratory: Diminished to auscultation bilaterally. Unlabored breathing and off of Supplemental O2 via [place] Cardiovascular: RRR; No appreciable m/r/g. No extremity edema Abdomen: Soft, NT, Distended due to body habitus. Bowel sounds present GU: Deferred Musculoskeletal: No contractures; No cyanosi Skin: Warm and Dry; No appreciable rashes, lesions on a limited skin eval Neurologic: CN 2-12 grossly intact. No appreciable focal deficits; Psychiatric: Normal mood and affect. Intact judgement and insight   Data  Reviewed: I have personally reviewed following labs and imaging studies  CBC: Recent Labs  Lab 04/05/17 1135 04/06/17 0657 04/07/17 0646  WBC 14.1* 18.4* 14.5*  NEUTROABS 10.4* 15.3* 10.0*  HGB 13.7 12.7 11.9*  HCT 40.2 37.5 36.8  MCV 92.0 93.5 94.8  PLT 305 298 326   Basic Metabolic Panel: Recent Labs  Lab 04/05/17 1135 04/06/17 0657 04/07/17 0646  NA 132* 137 139  K 3.9 4.2 4.6  CL 98* 102 104  CO2 25 25 28   GLUCOSE 129* 137* 112*  BUN 9 7 9   CREATININE 0.67 0.54 0.59  CALCIUM 10.4* 11.5* 11.0*  MG  --  1.9 1.9  PHOS  --  2.0* 2.5   GFR: Estimated Creatinine Clearance: 122.6 mL/min (by C-G formula based on SCr of 0.59 mg/dL). Liver Function Tests: Recent Labs  Lab 04/05/17 1135 04/07/17 0646  AST 23 30  ALT 19 35  ALKPHOS 100 88  BILITOT 0.6 0.6  PROT 7.8 6.4*  ALBUMIN 3.5 2.5*   No results for input(s): LIPASE, AMYLASE in the last 168 hours. No  results for input(s): AMMONIA in the last 168 hours. Coagulation Profile: No results for input(s): INR, PROTIME in the last 168 hours. Cardiac Enzymes: Recent Labs  Lab 04/05/17 1135 04/05/17 1744 04/06/17 0055 04/06/17 0657 04/06/17 1254  TROPONINI 0.14* 0.14* 0.16* 0.25* 0.14*   BNP (last 3 results) No results for input(s): PROBNP in the last 8760 hours. HbA1C: No results for input(s): HGBA1C in the last 72 hours. CBG: Recent Labs  Lab 04/06/17 0714 04/07/17 0758  GLUCAP 126* 96   Lipid Profile: No results for input(s): CHOL, HDL, LDLCALC, TRIG, CHOLHDL, LDLDIRECT in the last 72 hours. Thyroid Function Tests: No results for input(s): TSH, T4TOTAL, FREET4, T3FREE, THYROIDAB in the last 72 hours. Anemia Panel: No results for input(s): VITAMINB12, FOLATE, FERRITIN, TIBC, IRON, RETICCTPCT in the last 72 hours. Sepsis Labs: Recent Labs  Lab 04/05/17 1227  LATICACIDVEN 1.00    Recent Results (from the past 240 hour(s))  Blood Culture (routine x 2)     Status: None (Preliminary result)   Collection Time: 04/05/17 12:15 PM  Result Value Ref Range Status   Specimen Description   Final    BLOOD LEFT ARM Performed at [facility], [address] [place], [place] [address]    Special Requests   Final    BOTTLES DRAWN AEROBIC AND ANAEROBIC Blood Culture adequate volume Performed at [facility], [address] [place], [place] [address]    Culture   Final    NO GROWTH 2 DAYS Performed at [facility], [place] [address], [place], [place] [address]    Report Status PENDING  Incomplete  Blood Culture (routine x 2)     Status: None (Preliminary result)   Collection Time: 04/05/17 12:28 PM  Result Value Ref Range Status   Specimen Description   Final    BLOOD RIGHT ANTECUBITAL Performed at [facility], [address] [place], [place] [address]    Special Requests   Final    BOTTLES DRAWN AEROBIC AND ANAEROBIC Blood Culture adequate  volume Performed at [facility], [address] [place], [place] [address]    Culture   Final    NO GROWTH 2 DAYS Performed at [facility], [place] [address], [place], [place] [address]    Report Status PENDING  Incomplete  Respiratory Panel by PCR  Status: Abnormal   Collection Time: 04/06/17 12:56 AM  Result Value Ref Range Status   Adenovirus NOT DETECTED NOT DETECTED Final   Coronavirus 229E NOT DETECTED NOT DETECTED Final   Coronavirus HKU1 NOT DETECTED NOT DETECTED Final   Coronavirus NL63 NOT DETECTED NOT DETECTED Final   Coronavirus OC43 NOT DETECTED NOT DETECTED Final   Metapneumovirus NOT DETECTED NOT DETECTED Final   Rhinovirus / Enterovirus NOT DETECTED NOT DETECTED Final   Influenza A NOT DETECTED NOT DETECTED Final   Influenza B NOT DETECTED NOT DETECTED Final   Parainfluenza Virus 1 NOT DETECTED NOT DETECTED Final   Parainfluenza Virus 2 NOT DETECTED NOT DETECTED Final   Parainfluenza Virus 3 NOT DETECTED NOT DETECTED Final   Parainfluenza Virus 4 NOT DETECTED NOT DETECTED Final   Respiratory Syncytial Virus DETECTED (A) NOT DETECTED Final    Comment: CRITICAL RESULT CALLED TO, READ BACK BY AND VERIFIED WITH: E OWENS,RN AT 1232 04/06/17 BY L BENFIELD    Bordetella pertussis NOT DETECTED NOT DETECTED Final   Chlamydophila pneumoniae NOT DETECTED NOT DETECTED Final   Mycoplasma pneumoniae NOT DETECTED NOT DETECTED Final    Comment: Performed at [facility], [place] [address], [place], [place] [address]  Urine Culture     Status: None   Collection Time: 04/06/17  2:39 AM  Result Value Ref Range Status   Specimen Description URINE, CLEAN CATCH  Final   Special Requests NONE  Final   Culture   Final    NO GROWTH Performed at [facility], [place] [address], [place], [place] [address]    Report Status 04/07/2017 FINAL  Final    Radiology Studies: Dg Chest Port 1 View  Result Date: 04/07/2017 CLINICAL DATA:  Shortness of breath. EXAM:  PORTABLE CHEST 1 VIEW COMPARISON:  Chest x-ray from yesterday. FINDINGS: The heart size and mediastinal contours are within normal limits. Both lungs are clear. The visualized skeletal structures are unremarkable. IMPRESSION: No active disease. Electronically Signed   By: Titus Dubin M.D.   On: 04/07/2017 11:31   Dg Chest Port 1 View  Result Date: 04/06/2017 CLINICAL DATA:  Shortness of breath EXAM: PORTABLE CHEST 1 VIEW COMPARISON:  04/05/2017 FINDINGS: The heart size and mediastinal contours are within normal limits. Both lungs are clear. The visualized skeletal structures are unremarkable except for moderate diffuse thoracic spondylosis. Trachea is midline. No effusion or pneumothorax. IMPRESSION: No active disease. Electronically Signed   By: Jerilynn Mages.  Shick M.D.   On: 04/06/2017 10:49   Scheduled Meds: . aspirin  81 mg Oral Daily  . darifenacin  15 mg Oral Daily  . enalapril  20 mg Oral Daily  . enoxaparin (LOVENOX) injection  75 mg Subcutaneous Q24H  . escitalopram  10 mg Oral Daily  . gabapentin  300 mg Oral QHS  . guaiFENesin  1,200 mg Oral BID  . ipratropium  0.5 mg Nebulization BID  . levalbuterol  0.63 mg Nebulization BID  . loratadine  10 mg Oral Daily  . pantoprazole  40 mg Oral Daily  . simvastatin  40 mg Oral q1800  . sodium chloride flush  3 mL Intravenous Q12H  . sodium chloride flush  3 mL Intravenous Q12H   Continuous Infusions: . sodium chloride    . sodium chloride 75 mL/hr at 04/07/17 0707    LOS: 2 days   Kerney Elbe, DO [facility] Pager [phone]  If 7PM-7AM, please contact night-coverage [email or web address] Password [facility] 04/07/2017, 6:41 PM

## 2017-04-07 NOTE — Evaluation (Signed)
Physical Therapy Evaluation and Discharge Patient Details Name: Ellen [name] MRN: [id number] DOB: 02-10-1961 Today's Date: 04/07/2017   History of Present Illness  Pt is a 57 y.o. female with medical history significant for hypertension, hyperlipidemia, anxiety, and obesity presenting with influenza-like illness. She was admitted with acute hypoxic respiratory failure, elevated troponin, and SIRS.  Clinical Impression  Patient evaluated by Physical Therapy with no further acute PT needs identified. All education has been completed and the patient has no further questions. PT is signing off. Thank you for this referral.     Follow Up Recommendations No PT follow up    Equipment Recommendations  None recommended by PT  Discussed use of cane (especially when outside home). Pt refused as she reports she always has someone with her to hold onto and that feels more secure than any cane would.    Recommendations for Other Services       Precautions / Restrictions Precautions Precautions: Fall Restrictions Weight Bearing Restrictions: No      Mobility  Bed Mobility Overal bed mobility: Needs Assistance Bed Mobility: Supine to Sit;Sit to Supine     Supine to sit: Modified independent (Device/Increase time);HOB elevated Sit to supine: Modified independent (Device/Increase time);HOB elevated   General bed mobility comments: very mindful of her IV and surroundings  Transfers Overall transfer level: Needs assistance Equipment used: None Transfers: Sit to/from Stand Sit to Stand: Supervision         General transfer comment: for safety; denied dizziness; no LOB  Ambulation/Gait Ambulation/Gait assistance: Supervision;Min guard Ambulation Distance (Feet): 250 Feet Assistive device: None Gait Pattern/deviations: Step-through pattern;Decreased stride length;Drifts right/left;Wide base of support   Gait velocity interpretation: Below normal speed for age/gender General Gait  Details: tends to reach for rails and other things to hold onto; reports she does this at home  Stairs            Wheelchair Mobility    Modified Rankin (Stroke Patients Only)       Balance Overall balance assessment: Mild deficits observed, not formally tested                                           Pertinent Vitals/Pain Pain Assessment: 0-10 Pain Score: 7  Pain Location: headache Pain Descriptors / Indicators: Aching Pain Intervention(s): Monitored during session;Premedicated before session    Home Living Family/patient expects to be discharged to:: Private residence Living Arrangements: Other relatives(sisters; neice) Available Help at Discharge: Family;Available 24 hours/day           Home Equipment: None      Prior Function Level of Independence: Independent         Comments: Reports she does not drive but is otherwise independent for ADL and IADL.     Hand Dominance        Extremity/Trunk Assessment   Upper Extremity Assessment Upper Extremity Assessment: Overall WFL for tasks assessed    Lower Extremity Assessment Lower Extremity Assessment: Overall WFL for tasks assessed    Cervical / Trunk Assessment Cervical / Trunk Assessment: Other exceptions Cervical / Trunk Exceptions: morbidly obese  Communication   Communication: No difficulties  Cognition Arousal/Alertness: Awake/alert Behavior During Therapy: WFL for tasks assessed/performed Overall Cognitive Status: Within Functional Limits for tasks assessed  General Comments General comments (skin integrity, edema, etc.): sitting at rest on RA 91%; upon standing 93%; with walking 95% (checked x 3)    Exercises     Assessment/Plan    PT Assessment Patent does not need any further PT services  PT Problem List         PT Treatment Interventions      PT Goals (Current goals can be found in the Care Plan  section)  Acute Rehab PT Goals Patient Stated Goal: back to "doing everything" PT Goal Formulation: All assessment and education complete, DC therapy    Frequency     Barriers to discharge        Co-evaluation               AM-PAC PT "6 Clicks" Daily Activity  Outcome Measure Difficulty turning over in bed (including adjusting bedclothes, sheets and blankets)?: A Little Difficulty moving from lying on back to sitting on the side of the bed? : None Difficulty sitting down on and standing up from a chair with arms (e.g., wheelchair, bedside commode, etc,.)?: None Help needed moving to and from a bed to chair (including a wheelchair)?: None Help needed walking in hospital room?: None Help needed climbing 3-5 steps with a railing? : A Little 6 Click Score: 22    End of Session   Activity Tolerance: Patient tolerated treatment well Patient left: in bed;with call bell/phone within reach;with family/visitor present Nurse Communication: Mobility status;Other (comment)(SaO2) PT Visit Diagnosis: Unsteadiness on feet (R26.81)    Time: 2831-5176 PT Time Calculation (min) (ACUTE ONLY): 19 min   Charges:   PT Evaluation $PT Eval Low Complexity: 1 Low     PT G Codes:          [employer], PT 04/07/2017, 2:56 PM

## 2017-04-08 LAB — CBC WITH DIFFERENTIAL/PLATELET
BASOS PCT: 0 %
Basophils Absolute: 0 10*3/uL (ref 0.0–0.1)
EOS ABS: 0.1 10*3/uL (ref 0.0–0.7)
Eosinophils Relative: 1 %
HCT: 36.4 % (ref 36.0–46.0)
HEMOGLOBIN: 11.9 g/dL — AB (ref 12.0–15.0)
LYMPHS ABS: 3.1 10*3/uL (ref 0.7–4.0)
LYMPHS PCT: 30 %
MCH: 31.3 pg (ref 26.0–34.0)
MCHC: 32.7 g/dL (ref 30.0–36.0)
MCV: 95.8 fL (ref 78.0–100.0)
Monocytes Absolute: 1.1 10*3/uL — ABNORMAL HIGH (ref 0.1–1.0)
Monocytes Relative: 11 %
NEUTROS ABS: 5.9 10*3/uL (ref 1.7–7.7)
Neutrophils Relative %: 58 %
Platelets: 289 10*3/uL (ref 150–400)
RBC: 3.8 MIL/uL — ABNORMAL LOW (ref 3.87–5.11)
RDW: 12.8 % (ref 11.5–15.5)
WBC: 10.2 10*3/uL (ref 4.0–10.5)

## 2017-04-08 LAB — COMPREHENSIVE METABOLIC PANEL
ALK PHOS: 88 U/L (ref 38–126)
ALT: 51 U/L (ref 14–54)
ANION GAP: 8 (ref 5–15)
AST: 36 U/L (ref 15–41)
Albumin: 2.5 g/dL — ABNORMAL LOW (ref 3.5–5.0)
BILIRUBIN TOTAL: 0.5 mg/dL (ref 0.3–1.2)
BUN: 10 mg/dL (ref 6–20)
CALCIUM: 11.2 mg/dL — AB (ref 8.9–10.3)
CO2: 26 mmol/L (ref 22–32)
Chloride: 104 mmol/L (ref 101–111)
Creatinine, Ser: 0.54 mg/dL (ref 0.44–1.00)
GFR calc non Af Amer: >60 mL/min (ref >60)
Glucose, Bld: 103 mg/dL — ABNORMAL HIGH (ref 65–99)
POTASSIUM: 4.4 mmol/L (ref 3.5–5.1)
SODIUM: 138 mmol/L (ref 135–145)
TOTAL PROTEIN: 6.5 g/dL (ref 6.5–8.1)

## 2017-04-08 LAB — PTH, INTACT AND CALCIUM
Calcium, Total (PTH): 11.4 mg/dL — ABNORMAL HIGH (ref 8.7–10.2)
PTH: 46 pg/mL (ref 15–65)

## 2017-04-08 LAB — ECHOCARDIOGRAM COMPLETE
Height: 67 in
WEIGHTICAEL: 5563.2 [oz_av]

## 2017-04-08 LAB — MAGNESIUM: Magnesium: 1.7 mg/dL (ref 1.7–2.4)

## 2017-04-08 LAB — CALCIUM, IONIZED: Calcium, Ionized, Serum: 6.5 mg/dL — ABNORMAL HIGH (ref 4.5–5.6)

## 2017-04-08 LAB — VITAMIN D 25 HYDROXY (VIT D DEFICIENCY, FRACTURES): VIT D 25 HYDROXY: 19.8 ng/mL — AB (ref 30.0–100.0)

## 2017-04-08 LAB — PHOSPHORUS: PHOSPHORUS: 3 mg/dL (ref 2.5–4.6)

## 2017-04-08 LAB — GLUCOSE, CAPILLARY: GLUCOSE-CAPILLARY: 91 mg/dL (ref 65–99)

## 2017-04-08 MED ORDER — GUAIFENESIN ER 600 MG PO TB12: 1200.0000 mg | ORAL_TABLET | Freq: Two times a day (BID) | ORAL | 0 refills | Status: DC

## 2017-04-08 MED ORDER — LEVALBUTEROL HCL 0.63 MG/3ML IN NEBU: 0.6300 mg | INHALATION_SOLUTION | Freq: Four times a day (QID) | RESPIRATORY_TRACT | 0 refills | Status: AC | PRN

## 2017-04-08 MED ORDER — IPRATROPIUM BROMIDE 0.02 % IN SOLN: 0.5000 mg | Freq: Four times a day (QID) | RESPIRATORY_TRACT | 0 refills | Status: AC | PRN

## 2017-04-08 NOTE — Progress Notes (Signed)
Visited with patient and provided Holiday representative.  Patient wants to name daughter and fiance' as 82.  Will follow up with patient for completion.      04/08/17 1315  Clinical Encounter Type  Visited With Patient;Patient and family together  Visit Type Initial;Spiritual support  Spiritual Encounters  Spiritual Needs Literature (Advanced Directive)

## 2017-04-08 NOTE — Discharge Summary (Signed)
Physician Discharge Summary  Ellen [name]:[id number] DOB: 03/02/1960 DOA: 04/05/2017  PCP: Deborah Chalk, FNP  Admit date: 04/05/2017 Discharge date: 04/08/2017  Admitted From: Home Disposition:  Home  Recommendations for Outpatient Follow-up:  1. Follow up with PCP in 1-2 weeks 2. Follow up with PCP/Endocrinology for outpatient evaluation of Hypercalcemia 3. Follow up with Cardiology as an outpatient for Elevate Troponin and for potential outpatient stress testing  4. Please obtain CMP/CBC, Mag, Phos in one week 5. Please follow up on the following pending results:  Home Health: No Equipment/Devices: None   Discharge Condition: Stable  CODE STATUS: FULL CODE Diet recommendation: Heart Healthy Diet   Brief/Interim Summary: Ellen Nazaryan Flemingis a 57 y.o.femalewith medical history significant forhypertension, hyperlipidemia, anxiety, and obesity, now presenting to the emergency department for evaluation of generalized aches, cough, congestion, sore throat, fevers, nausea, and one episode of nonbloody vomiting. Patient reported that she had been in her usual state of health until approximately 5 days ago when the aforementioned symptoms began. Symptoms have continued to worsen. She denies abdominal pain chest pain, headache, or neck stiffness.Marland Kitchen She was admitted to the telemetry unit for ongoing evaluation and management of influenza-like illness with hypoxia and elevated troponin without angina. She improved and was weaned off of O2. ECHO done and showed EF of 65-70% and Grade 1 DD and troponin was flat. Her breathing improved and PT evaluated and she did well. She was deemed medically stable to D/C Home and follow up with Cardiology for stress testing.   Discharge Diagnoses:  Principal Problem:   Acute hypoxemic respiratory failure (HCC) Active Problems:   Obesity, morbid, BMI 50 or higher (HCC)   Essential hypertension   SIRS (systemic inflammatory response syndrome)  (HCC)   Elevated troponin   Hyponatremia   Anxiety   Acute respiratory failure with hypoxia (HCC)  Acute Respiratory Failure with Hypoxia 2/2 to RSV Infection, improved now on Room AIr -Presented with influenza-like illness; Has Hx of Bronchitis  -Reportedly had saturation in 80's ED; Now improved  -CXR this AM showed no acute cardiopulmonary disease and was clear, Influenza Negative -Respiratory Virus Panel Positive for RSV -Tachycardic initially, but no CP, leg tenderness and tachycardia resolved with IVF in ED -Troponin elevated and Continued to Rise and peaked 0.25 but now 0.14 -Echo ordered to assess for Heart Failure and Valvular Disease and showed EF of 65-70% and Grade 1 DD -Continue supplemental O2 as needed and Wean O2 as tolerated; Patient no longer requiring 2 Liters of O2 via Victor -Started Xopenex/Atrovent q6h and added Muicinex 1,200 mg po BID -Will D/C with Nebulizer -Stopped Tamiflu as Influenza Negative -PT/OT Evaluate and Treat and recommending no Follow Up -Walk Screen prior to D/C to assess O2 Needs and patient did not require O2 -Added Flutter Valve, Incentive Spirometry  -Continue to Hold Abx for now; Patient was Febrile on day of admission  -Follow up with PCP as an outpatient  SIRS from RSV Infection -Presented with fever, aches, cough, congestion, N/V, and sore throat -CXR clear, influenza PCR negative -Abdominal exam benign; no meningismus; no rash or wounds -She complained of bilateral flank discomfort but denies dysuria -Treated with an empiric dose of Tamiflu, Rocephin, and vanc in ED, but will hold Abx today and Reassess -Respiratory Virus Panel Positive for RSV -U/A Negative; Urine Cx showed No Growth -Improved  Elevated Troponin -Troponin is elevated to 0.14 without chest pain -Troponin Trended from 0.14 -> 0.14 -> 0.16 -> 0.25 -> 0.14 -No  known CAD -Treated in ED with ASA 324 mg; C/w 81 mg po Daily  -Continue Cardiac Monitoring with  Telemetry  -Checking Echocardiogram and showed EF of 65-70% and Grade 1 DD -Repeat EKG in AM -Follow up with Cardiology as an outpatient for outpatient testing   Leukocytosis, improved -Worsening. WBC went from 14.1 -> 18.4 -> 14.5 -> 10.2 -? Steroid Demargination from Decadron given in ED -Continue to Monitor for S/Sx of Infection -Repeat CBC as an outpatient   Hypertension -BP at goal -C/w Enalapril 20 mg po Daily  Depression and Anxiety -Stable. No SI/HI -C/w Escitalopram 10 mg po Daily   Obesity, BMI >50 -Hold/Stop phentermine in light of elevated Troponin -Weight Loss Counseling given   Hypercalcemia -Unclear Etiology -Ca2+ was 10.4 on admission and went to 11.5 and now 11.2 -Checking PTH, Ionized Ca2+, and Vitamin D Level and pending  -C/w IVF Rehydration with NS at 75 mL/hr; Given 1 Liter IVF Bolus yesterday  -Follow up with PCP/Endocrinology for further evaluation and testing  Hypophosphatemia -Patient's Phos Level was 2.0 and improved to 3.0 -Continue to Monitor and Replete as Necessary -Repeat CMP in AM   Hx of DJD -C/w Cyclobenzaprine 10 mg po BIDprn, Gabapentin 300 mg po Daily, and Hydrocodone-Acetaminophen 1-2 tab po q4hprn  GERD -C/w Pantoprazole 40 mg po Daily  HLD -C/w Simvastatin 40 mg po qHS  Discharge Instructions  Discharge Instructions    Call MD for:  difficulty breathing, headache or visual disturbances   Complete by:  As directed    Call MD for:  extreme fatigue   Complete by:  As directed    Call MD for:  hives   Complete by:  As directed    Call MD for:  persistant dizziness or light-headedness   Complete by:  As directed    Call MD for:  persistant nausea and vomiting   Complete by:  As directed    Call MD for:  redness, tenderness, or signs of infection (pain, swelling, redness, odor or green/yellow discharge around incision site)   Complete by:  As directed    Call MD for:  severe uncontrolled pain   Complete by:   As directed    Call MD for:  temperature >100.4   Complete by:  As directed    Diet - low sodium heart healthy   Complete by:  As directed    Discharge instructions   Complete by:  As directed    Follow up with PCP and Cardiology as an outpatient. Have PCP evaluate your hypercalcemia. Please see Cardiology for possible outpatient stress testing and Hospital Follow up. Take all medications as prescribed. If symptoms change or worsen please return to the ED for evaluation.   Increase activity slowly   Complete by:  As directed      Allergies as of 04/08/2017      Reactions   Codeine Itching   Only happened one time. Did not occur last time patient was in the hospital.      Medication List    TAKE these medications   albuterol 108 (90 Base) MCG/ACT inhaler Commonly known as:  PROVENTIL HFA;VENTOLIN HFA Inhale 2 puffs into the lungs every 4 (four) hours as needed for wheezing or shortness of breath.   amLODipine 5 MG tablet Commonly known as:  NORVASC Take 5 mg by mouth daily.   atorvastatin 20 MG tablet Commonly known as:  LIPITOR Take 20 mg by mouth daily.   BL ADULT ASPIRIN LOW STRENGTH  81 MG chewable tablet Generic drug:  aspirin Chew 81 mg by mouth daily.   cetirizine 10 MG tablet Commonly known as:  ZYRTEC TAKE 1 TABLET BY MOUTH ONCE A DAY What changed:    how much to take  how to take this  when to take this   cyclobenzaprine 10 MG tablet Commonly known as:  FLEXERIL Take 1 tablet (10 mg total) by mouth 2 (two) times daily as needed for muscle spasms.   enalapril 20 MG tablet Commonly known as:  VASOTEC Take 20 mg by mouth daily.   escitalopram 10 MG tablet Commonly known as:  LEXAPRO Take 10 mg by mouth daily.   fluticasone 50 MCG/ACT nasal spray Commonly known as:  FLONASE Place 2 sprays into both nostrils daily as needed for allergies or rhinitis.   gabapentin 300 MG capsule Commonly known as:  NEURONTIN Take 300 mg by mouth at bedtime.    guaiFENesin 600 MG 12 hr tablet Commonly known as:  MUCINEX Take 2 tablets (1,200 mg total) by mouth 2 (two) times daily.   ipratropium 0.02 % nebulizer solution Commonly known as:  ATROVENT Take 2.5 mLs (0.5 mg total) by nebulization every 6 (six) hours as needed for wheezing or shortness of breath.   levalbuterol 0.63 MG/3ML nebulizer solution Commonly known as:  XOPENEX Take 3 mLs (0.63 mg total) by nebulization every 6 (six) hours as needed for wheezing or shortness of breath.   medroxyPROGESTERone 10 MG tablet Commonly known as:  PROVERA Take 10 mg by mouth daily.   meloxicam 7.5 MG tablet Commonly known as:  MOBIC Take 7.5 mg by mouth daily.   omeprazole 40 MG capsule Commonly known as:  PRILOSEC Take 40 mg by mouth daily.   oxybutynin 10 MG 24 hr tablet Commonly known as:  DITROPAN-XL Take 10 mg by mouth daily.   oxyCODONE-acetaminophen 5-325 MG tablet Commonly known as:  PERCOCET/ROXICET Take 1 tablet by mouth every morning.   Vitamin D (Ergocalciferol) 50000 units Caps capsule Commonly known as:  DRISDOL Take 50,000 Units by mouth every Monday.            Durable Medical Equipment  (From admission, onward)        Start     Ordered   04/08/17 1256  For home use only DME Nebulizer/meds  Once    Question:  Patient needs a nebulizer to treat with the following condition  Answer:  SOB (shortness of breath)   04/08/17 1255     Follow-up Information    Deborah Chalk, FNP. Call.   Specialty:  Nurse Practitioner Contact information: [place] [address] [place] [address] [phone]        [place]. Call.   Why:  Follow up within 1 week Contact information: [place] [address] [phone]         Allergies  Allergen Reactions  . Codeine Itching    Only happened one time. Did not occur last time patient was in the hospital.    Consultations:  None  Procedures/Studies: Dg Chest 2 View  Result Date: 04/05/2017 CLINICAL DATA:  Cough, fever, congestion EXAM: CHEST  2 VIEW COMPARISON:  01/17/2017 FINDINGS: The heart size and mediastinal contours are within normal limits. Both lungs are clear. The visualized skeletal structures are unremarkable. IMPRESSION: No active cardiopulmonary disease. Electronically Signed   By: Kathreen Devoid   On: 04/05/2017 12:55   Ct Head Wo  Contrast  Result Date: 04/05/2017 CLINICAL DATA:  Altered mental status.  Leg weakness. EXAM: CT HEAD WITHOUT CONTRAST TECHNIQUE: Contiguous axial images were obtained from the base of the skull through the vertex without intravenous contrast. COMPARISON:  CT head dated May 11, 2011. FINDINGS: Brain: No evidence of acute infarction, hemorrhage, hydrocephalus, extra-axial collection or mass lesion/mass effect. Unchanged dense calcification along the tentorium and falx. Vascular: No hyperdense vessel or unexpected calcification. Skull: Normal. Negative for fracture or focal lesion. Sinuses/Orbits: Mild paranasal sinus mucosal thickening. Clear mastoid air cells. No acute orbital abnormality. Other: None. IMPRESSION: 1.  No acute intracranial abnormality. Electronically Signed   By: Titus Dubin M.D.   On: 04/05/2017 13:00   Dg Chest Port 1 View  Result Date: 04/07/2017 CLINICAL DATA:  Shortness of breath. EXAM: PORTABLE CHEST 1 VIEW COMPARISON:  Chest x-ray from yesterday. FINDINGS: The heart size and mediastinal contours are within normal limits. Both lungs are clear. The visualized skeletal structures are unremarkable. IMPRESSION: No active disease. Electronically Signed   By: Titus Dubin M.D.   On: 04/07/2017 11:31   Dg Chest Port 1 View  Result Date: 04/06/2017 CLINICAL DATA:  Shortness of breath EXAM: PORTABLE CHEST 1 VIEW COMPARISON:  04/05/2017 FINDINGS: The heart size and mediastinal contours are within normal limits. Both lungs are clear. The  visualized skeletal structures are unremarkable except for moderate diffuse thoracic spondylosis. Trachea is midline. No effusion or pneumothorax. IMPRESSION: No active disease. Electronically Signed   By: Jerilynn Mages.  Shick M.D.   On: 04/06/2017 10:49   ECHOCARDIOGRAM ------------------------------------------------------------------- Study Conclusions  - Left ventricle: The cavity size was normal. There was moderate   concentric hypertrophy. Systolic function was vigorous. The   estimated ejection fraction was in the range of 65% to 70%. Wall   motion was normal; there were no regional wall motion   abnormalities. Doppler parameters are consistent with abnormal   left ventricular relaxation (grade 1 diastolic dysfunction).   There was no evidence of elevated ventricular filling pressure by   Doppler parameters. - Aortic valve: There was no regurgitation. - Mitral valve: There was no regurgitation. - Left atrium: The atrium was normal in size. - Right ventricle: Systolic function was normal. - Tricuspid valve: There was mild regurgitation. - Pulmonary arteries: Systolic pressure was within the normal   range. - Inferior vena cava: The vessel was normal in size. - Pericardium, extracardiac: There was no pericardial effusion.  Subjective: Seen and examined and was feeling better. No CP or SOB. Breathing has improved. Ready to go home.  Discharge Exam: Vitals:   04/08/17 0913 04/08/17 1102  BP:  126/86  Pulse:  91  Resp:  18  Temp:  98.1 F (36.7 C)  SpO2: 96% 95%   Vitals:   04/07/17 1943 04/08/17 0538 04/08/17 0913 04/08/17 1102  BP: 125/61 (!) 143/74  126/86  Pulse: 100 68  91  Resp: 18 18  18   Temp: 98.4 F (36.9 C) 98.1 F (36.7 C)  98.1 F (36.7 C)  TempSrc: Oral Oral  Oral  SpO2:  96% 96% 95%  Weight:  (!) 159.3 kg (351 lb 1.6 oz)    Height:       General: Pt is an obese AAF who is alert, awake, not in acute distress Cardiovascular: RRR, S1/S2 +, no rubs, no  gallops Respiratory: Diminished  bilaterally, no wheezing, no rhonchi Abdominal: Soft, NT, ND, bowel sounds + Extremities: no edema, no cyanosis  The results of significant diagnostics from  this hospitalization (including imaging, microbiology, ancillary and laboratory) are listed below for reference.    Microbiology: Recent Results (from the past 240 hour(s))  Blood Culture (routine x 2)     Status: None (Preliminary result)   Collection Time: 04/05/17 12:15 PM  Result Value Ref Range Status   Specimen Description   Final    BLOOD LEFT ARM Performed at [facility], [address] [place], [place] [address]    Special Requests   Final    BOTTLES DRAWN AEROBIC AND ANAEROBIC Blood Culture adequate volume Performed at [facility], [address] [place], [place] [address]    Culture   Final    NO GROWTH 2 DAYS Performed at [facility], [place] [address], [place], [place] [address]    Report Status PENDING  Incomplete  Blood Culture (routine x 2)     Status: None (Preliminary result)   Collection Time: 04/05/17 12:28 PM  Result Value Ref Range Status   Specimen Description   Final    BLOOD RIGHT ANTECUBITAL Performed at [facility], [address] [place], [place] [address]    Special Requests   Final    BOTTLES DRAWN AEROBIC AND ANAEROBIC Blood Culture adequate volume Performed at [facility], [address] [place], [place] [address]    Culture   Final    NO GROWTH 2 DAYS Performed at [facility], [place] [address], [place], [place] [address]    Report Status PENDING  Incomplete  Respiratory Panel by PCR     Status: Abnormal   Collection Time: 04/06/17 12:56 AM  Result Value Ref Range Status   Adenovirus NOT DETECTED NOT DETECTED Final   Coronavirus 229E NOT DETECTED NOT DETECTED Final   Coronavirus HKU1 NOT DETECTED NOT DETECTED Final   Coronavirus NL63 NOT DETECTED NOT DETECTED Final   Coronavirus OC43 NOT  DETECTED NOT DETECTED Final   Metapneumovirus NOT DETECTED NOT DETECTED Final   Rhinovirus / Enterovirus NOT DETECTED NOT DETECTED Final   Influenza A NOT DETECTED NOT DETECTED Final   Influenza B NOT DETECTED NOT DETECTED Final   Parainfluenza Virus 1 NOT DETECTED NOT DETECTED Final   Parainfluenza Virus 2 NOT DETECTED NOT DETECTED Final   Parainfluenza Virus 3 NOT DETECTED NOT DETECTED Final   Parainfluenza Virus 4 NOT DETECTED NOT DETECTED Final   Respiratory Syncytial Virus DETECTED (A) NOT DETECTED Final    Comment: CRITICAL RESULT CALLED TO, READ BACK BY AND VERIFIED WITH: E OWENS,RN AT 1232 04/06/17 BY L BENFIELD    Bordetella pertussis NOT DETECTED NOT DETECTED Final   Chlamydophila pneumoniae NOT DETECTED NOT DETECTED Final   Mycoplasma pneumoniae NOT DETECTED NOT DETECTED Final    Comment: Performed at [facility], [place] [address] [place], [place] [address]  Urine Culture     Status: None   Collection Time: 04/06/17  2:39 AM  Result Value Ref Range Status   Specimen Description URINE, CLEAN CATCH  Final   Special Requests NONE  Final   Culture   Final    NO GROWTH Performed at [facility], [place] [address], [place], [place] [address]    Report Status 04/07/2017 FINAL  Final    Labs: BNP (last 3 results) No results for input(s): BNP in the last 8760 hours. Basic Metabolic Panel: Recent Labs  Lab 04/05/17 1135 04/06/17 0657 04/07/17 0646 04/08/17 0551  NA 132*  137 139 138  K 3.9 4.2 4.6 4.4  CL 98* 102 104 104  CO2 25 25 28 26   GLUCOSE 129* 137* 112* 103*  BUN 9 7 9 10   CREATININE 0.67 0.54 0.59 0.54  CALCIUM 10.4* 11.5* 11.0* 11.2*  MG  --  1.9 1.9 1.7  PHOS  --  2.0* 2.5 3.0   Liver Function Tests: Recent Labs  Lab 04/05/17 1135 04/07/17 0646 04/08/17 0551  AST 23 30 36  ALT 19 35 51  ALKPHOS 100 88 88  BILITOT 0.6 0.6 0.5  PROT 7.8 6.4* 6.5  ALBUMIN 3.5 2.5* 2.5*   No results for input(s): LIPASE, AMYLASE in the last 168 hours. No  results for input(s): AMMONIA in the last 168 hours. CBC: Recent Labs  Lab 04/05/17 1135 04/06/17 0657 04/07/17 0646 04/08/17 0551  WBC 14.1* 18.4* 14.5* 10.2  NEUTROABS 10.4* 15.3* 10.0* 5.9  HGB 13.7 12.7 11.9* 11.9*  HCT 40.2 37.5 36.8 36.4  MCV 92.0 93.5 94.8 95.8  PLT 305 298 282 289   Cardiac Enzymes: Recent Labs  Lab 04/05/17 1135 04/05/17 1744 04/06/17 0055 04/06/17 0657 04/06/17 1254  TROPONINI 0.14* 0.14* 0.16* 0.25* 0.14*   BNP: Invalid input(s): POCBNP CBG: Recent Labs  Lab 04/06/17 0714 04/07/17 0758 04/08/17 0707  GLUCAP 126* 96 91   D-Dimer No results for input(s): DDIMER in the last 72 hours. Hgb A1c No results for input(s): HGBA1C in the last 72 hours. Lipid Profile No results for input(s): CHOL, HDL, LDLCALC, TRIG, CHOLHDL, LDLDIRECT in the last 72 hours. Thyroid function studies No results for input(s): TSH, T4TOTAL, T3FREE, THYROIDAB in the last 72 hours.  Invalid input(s): FREET3 Anemia work up No results for input(s): VITAMINB12, FOLATE, FERRITIN, TIBC, IRON, RETICCTPCT in the last 72 hours. Urinalysis    Component Value Date/Time   COLORURINE YELLOW 04/06/2017 0251   APPEARANCEUR CLEAR 04/06/2017 0251   LABSPEC 1.026 04/06/2017 0251   PHURINE 6.0 04/06/2017 0251   GLUCOSEU NEGATIVE 04/06/2017 0251   GLUCOSEU NEG mg/dL 08/05/2006 2022   HGBUR NEGATIVE 04/06/2017 0251   BILIRUBINUR NEGATIVE 04/06/2017 0251   KETONESUR NEGATIVE 04/06/2017 0251   PROTEINUR NEGATIVE 04/06/2017 0251   UROBILINOGEN 1.0 06/14/2011 1828   NITRITE NEGATIVE 04/06/2017 0251   LEUKOCYTESUR NEGATIVE 04/06/2017 0251   Sepsis Labs Invalid input(s): PROCALCITONIN,  WBC,  LACTICIDVEN Microbiology Recent Results (from the past 240 hour(s))  Blood Culture (routine x 2)     Status: None (Preliminary result)   Collection Time: 04/05/17 12:15 PM  Result Value Ref Range Status   Specimen Description   Final    BLOOD LEFT ARM Performed at [facility],  [address] [place], [place] [address]    Special Requests   Final    BOTTLES DRAWN AEROBIC AND ANAEROBIC Blood Culture adequate volume Performed at [facility], [address] [place], [place] [address]    Culture   Final    NO GROWTH 2 DAYS Performed at [facility], [place] [address] [place], [place] [address]    Report Status PENDING  Incomplete  Blood Culture (routine x 2)     Status: None (Preliminary result)   Collection Time: 04/05/17 12:28 PM  Result Value Ref Range Status   Specimen Description   Final    BLOOD RIGHT ANTECUBITAL Performed at [facility], [address], [place], [place] [address]    Special Requests   Final  BOTTLES DRAWN AEROBIC AND ANAEROBIC Blood Culture adequate volume Performed at [facility], [address] [place], [place] [address]    Culture   Final    NO GROWTH 2 DAYS Performed at [facility], [place] [address], [place], [place] [address]    Report Status PENDING  Incomplete  Respiratory Panel by PCR     Status: Abnormal   Collection Time: 04/06/17 12:56 AM  Result Value Ref Range Status   Adenovirus NOT DETECTED NOT DETECTED Final   Coronavirus 229E NOT DETECTED NOT DETECTED Final   Coronavirus HKU1 NOT DETECTED NOT DETECTED Final   Coronavirus NL63 NOT DETECTED NOT DETECTED Final   Coronavirus OC43 NOT DETECTED NOT DETECTED Final   Metapneumovirus NOT DETECTED NOT DETECTED Final   Rhinovirus / Enterovirus NOT DETECTED NOT DETECTED Final   Influenza A NOT DETECTED NOT DETECTED Final   Influenza B NOT DETECTED NOT DETECTED Final   Parainfluenza Virus 1 NOT DETECTED NOT DETECTED Final   Parainfluenza Virus 2 NOT DETECTED NOT DETECTED Final   Parainfluenza Virus 3 NOT DETECTED NOT DETECTED Final   Parainfluenza Virus 4 NOT DETECTED NOT DETECTED Final   Respiratory Syncytial Virus DETECTED (A) NOT DETECTED Final    Comment: CRITICAL RESULT CALLED TO, READ BACK BY AND VERIFIED WITH: E  OWENS,RN AT 1232 04/06/17 BY L BENFIELD    Bordetella pertussis NOT DETECTED NOT DETECTED Final   Chlamydophila pneumoniae NOT DETECTED NOT DETECTED Final   Mycoplasma pneumoniae NOT DETECTED NOT DETECTED Final    Comment: Performed at [facility], [place] [address] [place], [place] [address]  Urine Culture     Status: None   Collection Time: 04/06/17  2:39 AM  Result Value Ref Range Status   Specimen Description URINE, CLEAN CATCH  Final   Special Requests NONE  Final   Culture   Final    NO GROWTH Performed at [facility], [place] [address], [place], [place] [address]    Report Status 04/07/2017 FINAL  Final   Time coordinating discharge: 35 minutes  SIGNED:  Kerney Elbe, DO [facility] 04/08/2017, 1:10 PM Pager [phone]  If 7PM-7AM, please contact night-coverage [email or web address] Password [email or web address]

## 2017-04-08 NOTE — Progress Notes (Signed)
Nebulizer machine ordered as requested and to be delivered to the patient's room today prior to discharge; Aneta Mins [phone]

## 2017-04-08 NOTE — Plan of Care (Signed)
  Health Behavior/Discharge Planning: Ability to manage health-related needs will improve 04/08/2017 1026 - Progressing by Imagene Gurney, RN

## 2017-04-08 NOTE — Progress Notes (Signed)
Orders received for pt discharge.  Discharge summary printed and reviewed with pt.  Explained medication regimen, and pt had no further questions at this time.  IV removed and site remains clean, dry, intact.  Telemetry removed.  Pt in stable condition and awaiting transport. 

## 2017-04-10 LAB — CULTURE, BLOOD (ROUTINE X 2)
CULTURE: NO GROWTH
Culture: NO GROWTH
SPECIAL REQUESTS: ADEQUATE
Special Requests: ADEQUATE

## 2017-04-16 ENCOUNTER — Ambulatory Visit: Payer: Medicare Other | Admitting: Cardiology

## 2017-04-17 ENCOUNTER — Encounter: Payer: Self-pay | Admitting: Cardiology

## 2017-04-26 NOTE — Progress Notes (Signed)
Referring-Sheikh, Bertram Savin, DO Reason for referral-Elevated troponin  HPI: 57 year old female for evaluation of elevated troponin at request of Kerney Elbe, DO.  Patient admitted February 2019 with respiratory failure, hypoxia and positive for RSV.  Echocardiogram showed normal LV systolic function, grade 1 diastolic dysfunction and mild tricuspid regurgitation.  Electrocardiogram showed sinus tachycardia with nonspecific ST changes.  Troponin noted to be minimally elevated at 0.14 and 0.25.  Cardiology now asked to evaluate.  Note patient presented as outlined above with hypoxia and altered mental status.  She has not had any exertional chest pain prior to her admission or after.  She has some dyspnea on exertion which she attributes to her weight.  No orthopnea, PND, pedal edema or syncope.  Because of the above cardiology asked to evaluate.  Current Outpatient Medications  Medication Sig Dispense Refill  . albuterol (PROVENTIL HFA;VENTOLIN HFA) 108 (90 Base) MCG/ACT inhaler Inhale 2 puffs into the lungs every 4 (four) hours as needed for wheezing or shortness of breath. 1 Inhaler 0  . amLODipine (NORVASC) 5 MG tablet Take 5 mg by mouth daily.    Marland Kitchen aspirin (BL ADULT ASPIRIN LOW STRENGTH) 81 MG chewable tablet Chew 81 mg by mouth daily.      Marland Kitchen atorvastatin (LIPITOR) 20 MG tablet Take 20 mg by mouth daily.  1  . cyclobenzaprine (FLEXERIL) 10 MG tablet Take 1 tablet (10 mg total) by mouth 2 (two) times daily as needed for muscle spasms. 20 tablet 0  . enalapril (VASOTEC) 20 MG tablet Take 20 mg by mouth daily.     Marland Kitchen escitalopram (LEXAPRO) 10 MG tablet Take 10 mg by mouth daily.    . fluticasone (FLONASE) 50 MCG/ACT nasal spray Place 2 sprays into both nostrils daily as needed for allergies or rhinitis.     Marland Kitchen gabapentin (NEURONTIN) 300 MG capsule Take 300 mg by mouth at bedtime.     Marland Kitchen guaiFENesin (MUCINEX) 600 MG 12 hr tablet Take 2 tablets (1,200 mg total) by mouth 2 (two) times  daily. 10 tablet 0  . ipratropium (ATROVENT) 0.02 % nebulizer solution Take 2.5 mLs (0.5 mg total) by nebulization every 6 (six) hours as needed for wheezing or shortness of breath. 75 mL 0  . levalbuterol (XOPENEX) 0.63 MG/3ML nebulizer solution Take 3 mLs (0.63 mg total) by nebulization every 6 (six) hours as needed for wheezing or shortness of breath. 3 mL 0  . medroxyPROGESTERone (PROVERA) 10 MG tablet Take 10 mg by mouth daily.  3  . meloxicam (MOBIC) 7.5 MG tablet Take 7.5 mg by mouth daily.  1  . omeprazole (PRILOSEC) 40 MG capsule Take 40 mg by mouth daily.     Marland Kitchen oxybutynin (DITROPAN-XL) 10 MG 24 hr tablet Take 10 mg by mouth daily.  8  . oxyCODONE-acetaminophen (PERCOCET/ROXICET) 5-325 MG tablet Take 1 tablet by mouth every morning.    . Vitamin D, Ergocalciferol, (DRISDOL) 50000 units CAPS capsule Take 50,000 Units by mouth every Monday.   2   No current facility-administered medications for this visit.     Allergies  Allergen Reactions  . Codeine Itching    Only happened one time. Did not occur last time patient was in the hospital.     Past Medical History:  Diagnosis Date  . Bronchitis    hx of  . Degenerative joint disease   . Depression   . ENDOMETRIAL POLYP 01/08/2006   Dr. Agnes Lawrence.  2001:  D&C, benign;  07/14/10:  D&C, hysteroscopy, polypectomy and bx of mons pubis (peau d'orange)...... 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  Marland Kitchen GERD (gastroesophageal reflux disease)   . Hernia   . High cholesterol   . Hypertension   . Lipoma   . Obesity     Past Surgical History:  Procedure Laterality Date  . CHOLECYSTECTOMY    . DILATION AND CURETTAGE OF UTERUS    . resection of lipoma    . UMBILICAL HERNIA REPAIR      Social History   Socioeconomic History  . Marital status: Single    Spouse name: Not on file  . Number of children: Not on file  . Years of education: Not on file  . Highest education level: Not on file  Social  Needs  . Financial resource strain: Not on file  . Food insecurity - worry: Not on file  . Food insecurity - inability: Not on file  . Transportation needs - medical: Not on file  . Transportation needs - non-medical: Not on file  Occupational History  . Not on file  Tobacco Use  . Smoking status: Never Smoker  . Smokeless tobacco: Never Used  Substance and Sexual Activity  . Alcohol use: No    Alcohol/week: 0.0 oz  . Drug use: No  . Sexual activity: Not on file  Other Topics Concern  . Not on file  Social History Narrative   Occupation: unemployed   single    Family History  Problem Relation Age of Onset  . Heart disease Mother   . Diabetes Mother   . Cancer Father     ROS: Arthralgias but no fevers or chills, productive cough, hemoptysis, dysphasia, odynophagia, melena, hematochezia, dysuria, hematuria, rash, seizure activity, orthopnea, PND, pedal edema, claudication. Remaining systems are negative.  Physical Exam:   Blood pressure 130/69, pulse 60, height 5\' 7"  (1.702 m), weight (!) 358 lb 6.4 oz (162.6 kg), last menstrual period 07/11/2014.  General:  Well developed/morbidly obese in NAD Skin warm/dry Patient not depressed No peripheral clubbing Back-normal HEENT-normal/normal eyelids Neck supple/normal carotid upstroke bilaterally; no bruits; no JVD; no thyromegaly chest - CTA/ normal expansion CV - RRR/normal S1 and S2; no murmurs, rubs or gallops;  PMI nondisplaced Abdomen -NT/ND, no HSM, no mass, + bowel sounds, no bruit 2+ femoral pulses, no bruits Ext-no edema, chords, 2+ DP Neuro-grossly nonfocal  ECG -April 05, 2017-sinus tachycardia, nonspecific ST changes.  Personally reviewed  Todays ECG shows NSR with no ST changes.  A/P  1 mildly elevated troponin-occurred in the setting of hypoxia and RSV infection.  She has not had cardiac symptoms including no chest pain.  Her electrocardiogram shows no diagnostic ST changes.  Troponin showed no clear  trend.  This was likely secondary to her respiratory infection and I do not think further cardiac evaluation is indicated unless she develops symptoms.  Also note her LV function was normal on echocardiogram.  2 morbid obesity-we discussed the importance of weight loss.  3 hypertension-blood pressure is controlled.  Continue present medications.  4 hyperlipidemia-continue statin.  Managed by primary care.  Kirk Ruths, MD

## 2017-05-01 ENCOUNTER — Ambulatory Visit (INDEPENDENT_AMBULATORY_CARE_PROVIDER_SITE_OTHER): Payer: Medicare Other | Admitting: Cardiology

## 2017-05-01 ENCOUNTER — Encounter: Payer: Self-pay | Admitting: Cardiology

## 2017-05-01 VITALS — BP 130/69 | HR 60 | Ht 67.0 in | Wt 358.4 lb

## 2017-05-01 DIAGNOSIS — R778 Other specified abnormalities of plasma proteins: Secondary | ICD-10-CM

## 2017-05-01 DIAGNOSIS — I10 Essential (primary) hypertension: Secondary | ICD-10-CM | POA: Diagnosis not present

## 2017-05-01 DIAGNOSIS — E7800 Pure hypercholesterolemia, unspecified: Secondary | ICD-10-CM

## 2017-05-01 DIAGNOSIS — R748 Abnormal levels of other serum enzymes: Secondary | ICD-10-CM | POA: Diagnosis not present

## 2017-05-01 DIAGNOSIS — R7989 Other specified abnormal findings of blood chemistry: Principal | ICD-10-CM

## 2017-05-01 NOTE — Patient Instructions (Signed)
Medication Instructions:  Your physician recommends that you continue on your current medications as directed. Please refer to the Current Medication list given to you today.   Labwork: -None  Testing/Procedures: -None  Follow-Up: As needed, please call office at [phone].  Any Other Special Instructions Will Be Listed Below (If Applicable).     If you need a refill on your cardiac medications before your next appointment, please call your pharmacy.

## 2017-05-14 ENCOUNTER — Ambulatory Visit: Payer: Medicare Other | Admitting: Cardiology

## 2017-06-06 ENCOUNTER — Encounter (HOSPITAL_BASED_OUTPATIENT_CLINIC_OR_DEPARTMENT_OTHER): Payer: Self-pay | Admitting: *Deleted

## 2017-06-06 ENCOUNTER — Emergency Department (HOSPITAL_BASED_OUTPATIENT_CLINIC_OR_DEPARTMENT_OTHER)
Admission: EM | Admit: 2017-06-06 | Discharge: 2017-06-06 | Disposition: A | Discharge status: Home / Self Care | Payer: Medicare HMO | Attending: Emergency Medicine | Admitting: Emergency Medicine

## 2017-06-06 ENCOUNTER — Other Ambulatory Visit: Payer: Self-pay

## 2017-06-06 ENCOUNTER — Emergency Department (HOSPITAL_BASED_OUTPATIENT_CLINIC_OR_DEPARTMENT_OTHER): Payer: Medicare HMO

## 2017-06-06 DIAGNOSIS — I10 Essential (primary) hypertension: Secondary | ICD-10-CM | POA: Insufficient documentation

## 2017-06-06 DIAGNOSIS — Z79899 Other long term (current) drug therapy: Secondary | ICD-10-CM | POA: Diagnosis not present

## 2017-06-06 DIAGNOSIS — R1084 Generalized abdominal pain: Secondary | ICD-10-CM | POA: Diagnosis not present

## 2017-06-06 DIAGNOSIS — R1010 Upper abdominal pain, unspecified: Secondary | ICD-10-CM | POA: Diagnosis present

## 2017-06-06 LAB — CBC
HCT: 37.3 % (ref 36.0–46.0)
Hemoglobin: 13 g/dL (ref 12.0–15.0)
MCH: 33.2 pg (ref 26.0–34.0)
MCHC: 34.9 g/dL (ref 30.0–36.0)
MCV: 95.2 fL (ref 78.0–100.0)
Platelets: 301 10*3/uL (ref 150–400)
RBC: 3.92 MIL/uL (ref 3.87–5.11)
RDW: 11.7 % (ref 11.5–15.5)
WBC: 8.8 10*3/uL (ref 4.0–10.5)

## 2017-06-06 LAB — COMPREHENSIVE METABOLIC PANEL
ALT: 14 U/L (ref 14–54)
ANION GAP: 7 (ref 5–15)
AST: 17 U/L (ref 15–41)
Albumin: 3.4 g/dL — ABNORMAL LOW (ref 3.5–5.0)
Alkaline Phosphatase: 75 U/L (ref 38–126)
BILIRUBIN TOTAL: 0.3 mg/dL (ref 0.3–1.2)
BUN: 14 mg/dL (ref 6–20)
CO2: 24 mmol/L (ref 22–32)
Calcium: 10.7 mg/dL — ABNORMAL HIGH (ref 8.9–10.3)
Chloride: 107 mmol/L (ref 101–111)
Creatinine, Ser: 0.63 mg/dL (ref 0.44–1.00)
GFR calc non Af Amer: >60 mL/min (ref >60)
Glucose, Bld: 108 mg/dL — ABNORMAL HIGH (ref 65–99)
POTASSIUM: 3.8 mmol/L (ref 3.5–5.1)
SODIUM: 138 mmol/L (ref 135–145)
TOTAL PROTEIN: 6.7 g/dL (ref 6.5–8.1)

## 2017-06-06 LAB — URINALYSIS, ROUTINE W REFLEX MICROSCOPIC
Bilirubin Urine: NEGATIVE
Glucose, UA: NEGATIVE mg/dL
KETONES UR: NEGATIVE mg/dL
Leukocytes, UA: NEGATIVE
NITRITE: NEGATIVE
Protein, ur: NEGATIVE mg/dL
Specific Gravity, Urine: 1.01 (ref 1.005–1.030)
pH: 6 (ref 5.0–8.0)

## 2017-06-06 LAB — URINALYSIS, MICROSCOPIC (REFLEX)

## 2017-06-06 LAB — LIPASE, BLOOD: Lipase: 23 U/L (ref 11–51)

## 2017-06-06 MED ORDER — MORPHINE SULFATE (PF) 4 MG/ML IV SOLN
4.0000 mg | Freq: Once | INTRAVENOUS | Status: AC
  Administered 2017-06-06: 4 mg via INTRAVENOUS
  Filled 2017-06-06: qty 1

## 2017-06-06 MED ORDER — IOPAMIDOL (ISOVUE-300) INJECTION 61%
100.0000 mL | Freq: Once | INTRAVENOUS | Status: AC | PRN
  Administered 2017-06-06: 100 mL via INTRAVENOUS

## 2017-06-06 MED ORDER — ONDANSETRON HCL 4 MG/2ML IJ SOLN
4.0000 mg | Freq: Once | INTRAMUSCULAR | Status: AC
  Administered 2017-06-06: 4 mg via INTRAVENOUS
  Filled 2017-06-06: qty 2

## 2017-06-06 NOTE — Discharge Instructions (Addendum)
No specific cause was found to explain your abdominal pain today. No concerning findings on CT or lab testing. Return here as needed for new or worsening symptoms

## 2017-06-06 NOTE — ED Provider Notes (Signed)
[facility] DEPART[facility] Provider Note   CSN: [id number] Arrival date & time: 06/06/17  1752     History   Chief Complaint Chief Complaint  Patient presents with  . Abdominal Pain    HPI Ellen [name] is a 57 y.o. female.  Complaint is abdominal pain  HPI: 57 year old female.  History of ventral hernia repair.  Previous cholecystectomy.  3 weeks of intermittent upper abdominal pain.  Sometimes on the right sometimes on the left.  Not associated with food.  No vomiting.  No diarrhea.  No fever.  No urinary symptoms.  States it hurts when she moves around in the bed or hurts when she pushes on her upper abdomen.  Past Medical History:  Diagnosis Date  . Bronchitis    hx of  . Degenerative joint disease   . Depression   . ENDOMETRIAL POLYP 01/08/2006   Dr. Agnes Lawrence.  2001:  D&C, benign;  07/14/10:  D&C, hysteroscopy, polypectomy and bx of mons pubis (peau d'orange)...... 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  Marland Kitchen GERD (gastroesophageal reflux disease)   . Hernia   . High cholesterol   . Hypertension   . Lipoma   . Obesity     Patient Active Problem List   Diagnosis Date Noted  . Anxiety 04/06/2017  . Acute respiratory failure with hypoxia ([place])   . Acute hypoxemic respiratory failure ([place]) 04/05/2017  . SIRS (systemic inflammatory response syndrome) ([place]) 04/05/2017  . Elevated troponin 04/05/2017  . Hyponatremia 04/05/2017  . Urgency of micturation 04/14/2015  . Dizziness 04/14/2015  . Neck pain 03/31/2015  . Abnormal uterine bleeding 08/29/2012  . Incarcerated ventral hernia 09/11/2010  . COUGH 05/13/2009  . ALLERGIC RHINITIS, SEASONAL 05/26/2008  . SHOULDER PAIN, LEFT 08/11/2007  . PRURITUS, VAGINAL 09/30/2006  . BACK PAIN 08/05/2006  . 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 LIPOMA 01/08/2006  . DYSLIPIDEMIA 01/08/2006  . Obesity, morbid, BMI 50 or higher ([place]) 01/08/2006  . DEPRESSION 01/08/2006  .  Essential hypertension 01/08/2006  . BRONCHITIS NOS 01/08/2006  . GERD 01/08/2006  . HERNIA, VENTRAL 01/08/2006  . ENDOMETRIAL POLYP 01/08/2006  . DEGENERATIVE JOINT DISEASE 01/08/2006    Past Surgical History:  Procedure Laterality Date  . CHOLECYSTECTOMY    . DILATION AND CURETTAGE OF UTERUS    . resection of lipoma    . UMBILICAL HERNIA REPAIR       OB History   None      Home Medications    Prior to Admission medications   Medication Sig Start Date End Date Taking? Authorizing Provider  albuterol (PROVENTIL HFA;VENTOLIN HFA) 108 (90 Base) MCG/ACT inhaler Inhale 2 puffs into the lungs every 4 (four) hours as needed for wheezing or shortness of breath. 11/29/15   Duffy Bruce, MD  amLODipine (NORVASC) 5 MG tablet Take 5 mg by mouth daily.    [provider]  aspirin (BL ADULT ASPIRIN LOW STRENGTH) 81 MG chewable tablet Chew 81 mg by mouth daily.      [provider]  atorvastatin (LIPITOR) 20 MG tablet Take 20 mg by mouth daily. 03/28/17   [provider]  cyclobenzaprine (FLEXERIL) 10 MG tablet Take 1 tablet (10 mg total) by mouth 2 (two) times daily as needed for muscle spasms. 02/10/17   Long, Wonda Olds, MD  enalapril (VASOTEC) 20 MG tablet Take 20 mg by mouth daily.     [provider]  escitalopram (LEXAPRO) 10 MG tablet Take 10 mg  by mouth daily.    [provider]  fluticasone (FLONASE) 50 MCG/ACT nasal spray Place 2 sprays into both nostrils daily as needed for allergies or rhinitis.  04/04/17   [provider]  gabapentin (NEURONTIN) 300 MG capsule Take 300 mg by mouth at bedtime.  12/21/14   [provider]  guaiFENesin (MUCINEX) 600 MG 12 hr tablet Take 2 tablets (1,200 mg total) by mouth 2 (two) times daily. 04/08/17   Raiford Noble Latif, DO  ipratropium (ATROVENT) 0.02 % nebulizer solution Take 2.5 mLs (0.5 mg total) by nebulization every 6 (six) hours as needed for wheezing or shortness of breath. 04/08/17    Sheikh, Omair Latif, DO  levalbuterol (XOPENEX) 0.63 MG/3ML nebulizer solution Take 3 mLs (0.63 mg total) by nebulization every 6 (six) hours as needed for wheezing or shortness of breath. 04/08/17   Raiford Noble Latif, DO  medroxyPROGESTERone (PROVERA) 10 MG tablet Take 10 mg by mouth daily. 03/26/17   [provider]  meloxicam (MOBIC) 7.5 MG tablet Take 7.5 mg by mouth daily. 04/02/17   [provider]  omeprazole (PRILOSEC) 40 MG capsule Take 40 mg by mouth daily.  12/17/14   [provider]  oxybutynin (DITROPAN-XL) 10 MG 24 hr tablet Take 10 mg by mouth daily. 03/24/17   [provider]  oxyCODONE-acetaminophen (PERCOCET/ROXICET) 5-325 MG tablet Take 1 tablet by mouth every morning.    [provider]  Vitamin D, Ergocalciferol, (DRISDOL) 50000 units CAPS capsule Take 50,000 Units by mouth every Monday.  03/24/17   [provider]    Family History Family History  Problem Relation Age of Onset  . Heart disease Mother   . Diabetes Mother   . Cancer Father     Social History Social History   Tobacco Use  . Smoking status: Never Smoker  . Smokeless tobacco: Never Used  Substance Use Topics  . Alcohol use: No    Alcohol/week: 0.0 oz  . Drug use: No     Allergies   Codeine   Review of Systems Review of Systems  Constitutional: Negative for appetite change, chills, diaphoresis, fatigue and fever.  HENT: Negative for mouth sores, sore throat and trouble swallowing.   Eyes: Negative for visual disturbance.  Respiratory: Negative for cough, chest tightness, shortness of breath and wheezing.   Cardiovascular: Negative for chest pain.  Gastrointestinal: Positive for abdominal pain. Negative for abdominal distention, diarrhea, nausea and vomiting.  Endocrine: Negative for polydipsia, polyphagia and polyuria.  Genitourinary: Negative for dysuria, frequency and hematuria.  Musculoskeletal: Negative for gait problem.  Skin:  Negative for color change, pallor and rash.  Neurological: Negative for dizziness, syncope, light-headedness and headaches.  Hematological: Does not bruise/bleed easily.  Psychiatric/Behavioral: Negative for behavioral problems and confusion.     Physical Exam Updated Vital Signs BP (!) 142/79 (BP Location: Right Wrist)   Pulse 75   Temp 98.1 F (36.7 C) (Oral)   Resp 20   Ht 5\' 7"  (1.702 m)   Wt (!) 162.4 kg (358 lb)   LMP 07/11/2014   SpO2 96%   BMI 56.07 kg/m   Physical Exam  Constitutional: She is oriented to person, place, and time. She appears well-developed and well-nourished. No distress.  Obese African American female.  Amatory in the bed without apparent discomfort.  HENT:  Head: Normocephalic.  Eyes: Pupils are equal, round, and reactive to light. Conjunctivae are normal. No scleral icterus.  Neck: Normal range of motion. Neck supple. No thyromegaly present.  Cardiovascular: Normal rate and regular rhythm. Exam reveals no gallop and no friction rub.  No murmur heard. Pulmonary/Chest: Effort normal and breath sounds normal. No respiratory distress. She has no wheezes. She has no rales.  Abdominal: Soft. Bowel sounds are normal. She exhibits no distension. There is no tenderness. There is no rebound.  Tenderness in the right lateral upper abdomen.  No flank tenderness.  Girth does limit exam.  No peritoneal irritation.  Musculoskeletal: Normal range of motion.  Neurological: She is alert and oriented to person, place, and time.  Skin: Skin is warm and dry. No rash noted.  Psychiatric: She has a normal mood and affect. Her behavior is normal.     ED Treatments / Results  Labs (all labs ordered are listed, but only abnormal results are displayed) Labs Reviewed  COMPREHENSIVE METABOLIC PANEL - Abnormal; Notable for the following components:      Result Value   Glucose, Bld 108 (*)    Calcium 10.7 (*)    Albumin 3.4 (*)    All other components within normal  limits  URINALYSIS, ROUTINE W REFLEX MICROSCOPIC - Abnormal; Notable for the following components:   Hgb urine dipstick TRACE (*)    All other components within normal limits  URINALYSIS, MICROSCOPIC (REFLEX) - Abnormal; Notable for the following components:   Bacteria, UA RARE (*)    Squamous Epithelial / LPF 0-5 (*)    All other components within normal limits  LIPASE, BLOOD  CBC    EKG None  Radiology Ct Abdomen Pelvis W Contrast  Result Date: 06/06/2017 CLINICAL DATA:  RIGHT lower quadrant pain for 3 weeks. Cholecystectomy. EXAM: CT ABDOMEN AND PELVIS WITH CONTRAST TECHNIQUE: Multidetector CT imaging of the abdomen and pelvis was performed using the standard protocol following bolus administration of intravenous contrast. CONTRAST:  15mL ISOVUE-300 IOPAMIDOL (ISOVUE-300) INJECTION 61% COMPARISON:  None. FINDINGS: Lower chest: Lung bases are clear. Hepatobiliary: No focal hepatic lesion. Postcholecystectomy. No biliary dilatation. Pancreas: Pancreas is normal. No ductal dilatation. No pancreatic inflammation. Spleen: Normal spleen Adrenals/urinary tract: Adrenal glands and kidneys are normal. The ureters and bladder normal. Stomach/Bowel: Stomach, small bowel, appendix, and cecum are normal. The colon and rectosigmoid colon are normal. Vascular/Lymphatic: Abdominal aorta is normal caliber. No periportal or retroperitoneal adenopathy. No pelvic adenopathy. Reproductive: Uterus and ovaries normal Other: No inguinal or ventral hernia. Musculoskeletal: No aggressive osseous lesion. IMPRESSION: 1. No acute abdominopelvic findings. 2. Normal appendix. 3. Post cholecystectomy. Electronically Signed   By: Suzy Bouchard M.D.   On: 06/06/2017 22:44    Procedures Procedures (including critical care time)  Medications Ordered in ED Medications  ondansetron (ZOFRAN) injection 4 mg (4 mg Intravenous Given 06/06/17 2106)  morphine 4 MG/ML injection 4 mg (4 mg Intravenous Given 06/06/17 2106)    iopamidol (ISOVUE-300) 61 % injection 100 mL (100 mLs Intravenous Contrast Given 06/06/17 2202)     Initial Impression / Assessment and Plan / ED Course  I have reviewed the triage vital signs and the nursing notes.  Pertinent labs & imaging results that were available during my care of the patient were reviewed by me and considered in my medical decision making (see chart for details).    Reassuring labs.  CT without acute findings.  No ventral hernia defect.  Plan discharge home.  Expectant management.  Return no changes.  Primary care follow-up.  This may be simple muscular skeletal pain.  Final Clinical Impressions(s) / ED Diagnoses   Final diagnoses:  Generalized abdominal pain  ED Discharge Orders    None       Tanna Furry, MD 06/06/17 2320

## 2017-06-06 NOTE — ED Triage Notes (Signed)
Abdominal pain x 3 weeks. Denies constipation.

## 2017-09-07 ENCOUNTER — Emergency Department (HOSPITAL_BASED_OUTPATIENT_CLINIC_OR_DEPARTMENT_OTHER)
Admission: EM | Admit: 2017-09-07 | Discharge: 2017-09-07 | Disposition: A | Discharge status: Home / Self Care | Payer: Medicare HMO | Attending: Emergency Medicine | Admitting: Emergency Medicine

## 2017-09-07 ENCOUNTER — Other Ambulatory Visit: Payer: Self-pay

## 2017-09-07 ENCOUNTER — Encounter (HOSPITAL_BASED_OUTPATIENT_CLINIC_OR_DEPARTMENT_OTHER): Payer: Self-pay | Admitting: *Deleted

## 2017-09-07 DIAGNOSIS — Z7982 Long term (current) use of aspirin: Secondary | ICD-10-CM | POA: Diagnosis not present

## 2017-09-07 DIAGNOSIS — M5432 Sciatica, left side: Secondary | ICD-10-CM | POA: Insufficient documentation

## 2017-09-07 DIAGNOSIS — Z79899 Other long term (current) drug therapy: Secondary | ICD-10-CM | POA: Diagnosis not present

## 2017-09-07 DIAGNOSIS — I10 Essential (primary) hypertension: Secondary | ICD-10-CM | POA: Insufficient documentation

## 2017-09-07 DIAGNOSIS — M79605 Pain in left leg: Secondary | ICD-10-CM | POA: Diagnosis present

## 2017-09-07 MED ORDER — PREDNISONE 20 MG PO TABS: 40.0000 mg | ORAL_TABLET | Freq: Every day | ORAL | 0 refills | Status: DC

## 2017-09-07 MED ORDER — METHOCARBAMOL 500 MG PO TABS: 500.0000 mg | ORAL_TABLET | Freq: Two times a day (BID) | ORAL | 0 refills | Status: DC

## 2017-09-07 MED ORDER — KETOROLAC TROMETHAMINE 60 MG/2ML IM SOLN
60.0000 mg | Freq: Once | INTRAMUSCULAR | Status: AC
  Administered 2017-09-07: 60 mg via INTRAMUSCULAR
  Filled 2017-09-07: qty 2

## 2017-09-07 MED ORDER — METHOCARBAMOL 500 MG PO TABS
500.0000 mg | ORAL_TABLET | Freq: Once | ORAL | Status: AC
  Administered 2017-09-07: 500 mg via ORAL
  Filled 2017-09-07: qty 1

## 2017-09-07 NOTE — ED Provider Notes (Signed)
[place] EMERGENCY DEPARTMENT Provider Note   CSN: [id number] Arrival date & time: 09/07/17  1102     History   Chief Complaint Chief Complaint  Patient presents with  . Leg Pain    HPI Ellen [name] is a 57 y.o. female.  The history is provided by the patient.  Leg Pain   This is a new problem. Episode onset: 1 month ago. The problem occurs constantly. The problem has been gradually worsening. The pain is present in the left upper leg. The quality of the pain is described as aching and constant (throbbing). The pain is at a severity of 8/10. The pain is severe. Associated symptoms include numbness and tingling. Pertinent negatives include full range of motion and no stiffness. Associated symptoms comments: Numbness down the back of the left leg and the great toe feels numb.  Also muscle spasm in the buttocks area that worsens when lying down at night. Exacerbated by: lying down to sleep at night. Treatments tried: meloxicam, ibuprofen and flexeril. The treatment provided no relief. There has been no history of extremity trauma. hx of chronic back pain    Past Medical History:  Diagnosis Date  . Bronchitis    hx of  . Degenerative joint disease   . Depression   . ENDOMETRIAL POLYP 01/08/2006   Dr. Agnes Lawrence.  2001:  D&C, benign;  07/14/10:  D&C, hysteroscopy, polypectomy and bx of mons pubis (peau d'orange)...... 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  Marland Kitchen GERD (gastroesophageal reflux disease)   . Hernia   . High cholesterol   . Hypertension   . Lipoma   . Obesity     Patient Active Problem List   Diagnosis Date Noted  . Anxiety 04/06/2017  . Acute respiratory failure with hypoxia ([place])   . Acute hypoxemic respiratory failure ([place]) 04/05/2017  . SIRS (systemic inflammatory response syndrome) ([place]) 04/05/2017  . Elevated troponin 04/05/2017  . Hyponatremia 04/05/2017  . Urgency of micturation 04/14/2015  . Dizziness  04/14/2015  . Neck pain 03/31/2015  . Abnormal uterine bleeding 08/29/2012  . Incarcerated ventral hernia 09/11/2010  . COUGH 05/13/2009  . ALLERGIC RHINITIS, SEASONAL 05/26/2008  . SHOULDER PAIN, LEFT 08/11/2007  . PRURITUS, VAGINAL 09/30/2006  . BACK PAIN 08/05/2006  . 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 LIPOMA 01/08/2006  . DYSLIPIDEMIA 01/08/2006  . Obesity, morbid, BMI 50 or higher ([place]) 01/08/2006  . DEPRESSION 01/08/2006  . Essential hypertension 01/08/2006  . BRONCHITIS NOS 01/08/2006  . GERD 01/08/2006  . HERNIA, VENTRAL 01/08/2006  . ENDOMETRIAL POLYP 01/08/2006  . DEGENERATIVE JOINT DISEASE 01/08/2006    Past Surgical History:  Procedure Laterality Date  . CHOLECYSTECTOMY    . DILATION AND CURETTAGE OF UTERUS    . resection of lipoma    . UMBILICAL HERNIA REPAIR       OB History   None      Home Medications    Prior to Admission medications   Medication Sig Start Date End Date Taking? Authorizing Provider  albuterol (PROVENTIL HFA;VENTOLIN HFA) 108 (90 Base) MCG/ACT inhaler Inhale 2 puffs into the lungs every 4 (four) hours as needed for wheezing or shortness of breath. 11/29/15   Duffy Bruce, MD  amLODipine (NORVASC) 5 MG tablet Take 5 mg by mouth daily.    [provider]  aspirin (BL ADULT ASPIRIN LOW STRENGTH) 81 MG chewable tablet Chew 81 mg by mouth daily.      [provider]  atorvastatin (LIPITOR) 20 MG tablet Take 20 mg by mouth daily. 03/28/17   [provider]  cyclobenzaprine (FLEXERIL) 10 MG tablet Take 1 tablet (10 mg total) by mouth 2 (two) times daily as needed for muscle spasms. 02/10/17   Long, Wonda Olds, MD  enalapril (VASOTEC) 20 MG tablet Take 20 mg by mouth daily.     [provider]  escitalopram (LEXAPRO) 10 MG tablet Take 10 mg by mouth daily.    [provider]  fluticasone (FLONASE) 50 MCG/ACT nasal spray Place 2 sprays into both nostrils daily as needed for allergies or rhinitis.   04/04/17   [provider]  gabapentin (NEURONTIN) 300 MG capsule Take 300 mg by mouth at bedtime.  12/21/14   [provider]  guaiFENesin (MUCINEX) 600 MG 12 hr tablet Take 2 tablets (1,200 mg total) by mouth 2 (two) times daily. 04/08/17   Raiford Noble Latif, DO  ipratropium (ATROVENT) 0.02 % nebulizer solution Take 2.5 mLs (0.5 mg total) by nebulization every 6 (six) hours as needed for wheezing or shortness of breath. 04/08/17   Sheikh, Omair Latif, DO  levalbuterol (XOPENEX) 0.63 MG/3ML nebulizer solution Take 3 mLs (0.63 mg total) by nebulization every 6 (six) hours as needed for wheezing or shortness of breath. 04/08/17   Raiford Noble Latif, DO  medroxyPROGESTERone (PROVERA) 10 MG tablet Take 10 mg by mouth daily. 03/26/17   [provider]  meloxicam (MOBIC) 7.5 MG tablet Take 7.5 mg by mouth daily. 04/02/17   [provider]  omeprazole (PRILOSEC) 40 MG capsule Take 40 mg by mouth daily.  12/17/14   [provider]  oxybutynin (DITROPAN-XL) 10 MG 24 hr tablet Take 10 mg by mouth daily. 03/24/17   [provider]  oxyCODONE-acetaminophen (PERCOCET/ROXICET) 5-325 MG tablet Take 1 tablet by mouth every morning.    [provider]  Vitamin D, Ergocalciferol, (DRISDOL) 50000 units CAPS capsule Take 50,000 Units by mouth every Monday.  03/24/17   [provider]    Family History Family History  Problem Relation Age of Onset  . Heart disease Mother   . Diabetes Mother   . Cancer Father     Social History Social History   Tobacco Use  . Smoking status: Never Smoker  . Smokeless tobacco: Never Used  Substance Use Topics  . Alcohol use: No    Alcohol/week: 0.0 oz  . Drug use: No     Allergies   Codeine   Review of Systems Review of Systems  Musculoskeletal: Negative for stiffness.  Neurological: Positive for tingling and numbness.  All other systems reviewed and are negative.    Physical Exam Updated Vital  Signs BP (!) 146/75 (BP Location: Right Wrist)   Pulse (!) 102   Temp 98.2 F (36.8 C) (Oral)   Resp 18   Wt (!) 166 kg (366 lb)   LMP 07/11/2014   SpO2 99%   BMI 57.32 kg/m   Physical Exam  Constitutional: She is oriented to person, place, and time. She appears well-developed and well-nourished. No distress.  Morbidly obese  HENT:  Head: Normocephalic and atraumatic.  Mouth/Throat: Oropharynx is clear and moist.  Eyes: Pupils are equal, round, and reactive to light. Conjunctivae and EOM are normal.  Neck: Normal range of motion. Neck supple.  Cardiovascular: Normal rate, regular rhythm and intact distal pulses.  No murmur heard. Pulmonary/Chest: Effort normal and breath sounds normal. No respiratory distress. She has no wheezes. She has no rales.  Abdominal: Soft. She exhibits no distension. There is no tenderness. There is no rebound and no guarding.  Musculoskeletal: Normal range of motion. She exhibits tenderness. She exhibits no edema.       Left upper leg: She exhibits tenderness.       Legs: Neurological: She is alert and oriented to person, place, and time. She has normal strength.  Mild decrease sensation in left great toe  Skin: Skin is warm and dry. No rash noted. No erythema.  Psychiatric: She has a normal mood and affect. Her behavior is normal.  Nursing note and vitals reviewed.    ED Treatments / Results  Labs (all labs ordered are listed, but only abnormal results are displayed) Labs Reviewed - No data to display  EKG None  Radiology No results found.  Procedures Procedures (including critical care time)  Medications Ordered in ED Medications  ketorolac (TORADOL) injection 60 mg (has no administration in time range)  methocarbamol (ROBAXIN) tablet 500 mg (has no administration in time range)     Initial Impression / Assessment and Plan / ED Course  I have reviewed the triage vital signs and the nursing notes.  Pertinent labs & imaging results  that were available during my care of the patient were reviewed by me and considered in my medical decision making (see chart for details).     Pt with gradual onset of back pain suggestive of radiculopathy.  No neurovascular compromise and no incontinence.  Pt has no infectious sx, hx of CA  or other red flags concerning for pathologic back pain.  Pt is able to ambulate but is painful.  Normal strength and reflexes on exam.  Denies trauma. Will give pt pain control and to return for developement of above sx.  12:44 PM Pt significantly better after robaxin and toradol.  Final Clinical Impressions(s) / ED Diagnoses   Final diagnoses:  Sciatica of left side    ED Discharge Orders        Ordered    predniSONE (DELTASONE) 20 MG tablet  Daily     09/07/17 1244    methocarbamol (ROBAXIN) 500 MG tablet  2 times daily     09/07/17 1245       Blanchie Dessert, MD 09/07/17 1245

## 2017-09-07 NOTE — ED Triage Notes (Addendum)
Pt c/o left posterior upper leg pain that started one month ago. Has an appointment Monday with her ortho MD. Has tried ibuprofen with no relief. States pain is constant and sharp. Denies any blood clot history. Denies any injury. Also states pain meds at home do not help pain

## 2017-09-07 NOTE — ED Notes (Signed)
ED Provider at bedside. 

## 2018-02-19 ENCOUNTER — Encounter (HOSPITAL_BASED_OUTPATIENT_CLINIC_OR_DEPARTMENT_OTHER): Payer: Self-pay

## 2018-02-19 ENCOUNTER — Emergency Department (HOSPITAL_BASED_OUTPATIENT_CLINIC_OR_DEPARTMENT_OTHER)
Admission: EM | Admit: 2018-02-19 | Discharge: 2018-02-20 | Disposition: A | Discharge status: Home / Self Care | Payer: Medicare HMO | Attending: Emergency Medicine | Admitting: Emergency Medicine

## 2018-02-19 ENCOUNTER — Other Ambulatory Visit: Payer: Self-pay

## 2018-02-19 DIAGNOSIS — M5442 Lumbago with sciatica, left side: Secondary | ICD-10-CM | POA: Diagnosis not present

## 2018-02-19 DIAGNOSIS — R29898 Other symptoms and signs involving the musculoskeletal system: Secondary | ICD-10-CM

## 2018-02-19 DIAGNOSIS — M6281 Muscle weakness (generalized): Secondary | ICD-10-CM | POA: Insufficient documentation

## 2018-02-19 DIAGNOSIS — M5137 Other intervertebral disc degeneration, lumbosacral region: Secondary | ICD-10-CM | POA: Diagnosis not present

## 2018-02-19 DIAGNOSIS — Z79899 Other long term (current) drug therapy: Secondary | ICD-10-CM | POA: Insufficient documentation

## 2018-02-19 DIAGNOSIS — M5441 Lumbago with sciatica, right side: Secondary | ICD-10-CM | POA: Insufficient documentation

## 2018-02-19 DIAGNOSIS — I10 Essential (primary) hypertension: Secondary | ICD-10-CM | POA: Diagnosis not present

## 2018-02-19 DIAGNOSIS — G8929 Other chronic pain: Secondary | ICD-10-CM | POA: Diagnosis not present

## 2018-02-19 DIAGNOSIS — R531 Weakness: Secondary | ICD-10-CM | POA: Diagnosis present

## 2018-02-19 LAB — CBC WITH DIFFERENTIAL/PLATELET
ABS IMMATURE GRANULOCYTES: 0.01 10*3/uL (ref 0.00–0.07)
Basophils Absolute: 0 10*3/uL (ref 0.0–0.1)
Basophils Relative: 0 %
Eosinophils Absolute: 0.1 10*3/uL (ref 0.0–0.5)
Eosinophils Relative: 2 %
HEMATOCRIT: 38.9 % (ref 36.0–46.0)
Hemoglobin: 12.3 g/dL (ref 12.0–15.0)
Immature Granulocytes: 0 %
LYMPHS ABS: 2.4 10*3/uL (ref 0.7–4.0)
LYMPHS PCT: 34 %
MCH: 30.8 pg (ref 26.0–34.0)
MCHC: 31.6 g/dL (ref 30.0–36.0)
MCV: 97.5 fL (ref 80.0–100.0)
MONOS PCT: 9 %
Monocytes Absolute: 0.7 10*3/uL (ref 0.1–1.0)
NEUTROS ABS: 3.9 10*3/uL (ref 1.7–7.7)
NEUTROS PCT: 55 %
Platelets: 288 10*3/uL (ref 150–400)
RBC: 3.99 MIL/uL (ref 3.87–5.11)
RDW: 11.6 % (ref 11.5–15.5)
WBC: 7.2 10*3/uL (ref 4.0–10.5)
nRBC: 0 % (ref 0.0–0.2)

## 2018-02-19 LAB — BASIC METABOLIC PANEL
Anion gap: 7 (ref 5–15)
BUN: 10 mg/dL (ref 6–20)
CO2: 26 mmol/L (ref 22–32)
Calcium: 9.9 mg/dL (ref 8.9–10.3)
Chloride: 105 mmol/L (ref 98–111)
Creatinine, Ser: 0.56 mg/dL (ref 0.44–1.00)
GFR calc Af Amer: >60 mL/min (ref >60)
GLUCOSE: 120 mg/dL — AB (ref 70–99)
POTASSIUM: 3.8 mmol/L (ref 3.5–5.1)
Sodium: 138 mmol/L (ref 135–145)

## 2018-02-19 MED ORDER — OXYCODONE-ACETAMINOPHEN 5-325 MG PO TABS
2.0000 | ORAL_TABLET | Freq: Once | ORAL | Status: AC
  Administered 2018-02-19: 2 via ORAL
  Filled 2018-02-19: qty 2

## 2018-02-19 NOTE — ED Provider Notes (Signed)
[place] EMERGENCY DEPARTMENT Provider Note   CSN: [id number] Arrival date & time: 02/19/18  2058     History   Chief Complaint Chief Complaint  Patient presents with  . Leg Pain    HPI Ellen [name] is a 58 y.o. female.  Patient is a 58 year old morbidly obese female with a history of hypertension, chronic pain and degenerative disc disease who is presenting today with bilateral leg numbness and weakness that started approximately 8 days ago.  Patient states she always has chronic back pain and she takes Percocet intermittently for her as managed by her pain clinic.  On Christmas Eve she sat up from bed and had pain in her back but noticed a strange tingling sensation in both of her legs right greater than left.  Over the next 8 days she has had worsening numbness and worsening weakness in her legs to where now she cannot even walk with a cane.  She states she normally can walk without assistance.  She also has not been able to control her urine and has been wearing a depends one because she cannot walk to the bathroom and to because she states she cannot control it from coming out.  She has ongoing back pain but states it is no different than her chronic back pain.  She denies any fecal incontinence.  She denies any leg pain.  She is never had symptoms like this before.  No abdominal pain, chest pain or shortness of breath.  The history is provided by the patient.  Leg Pain   The pain is at a severity of 6/10.    Past Medical History:  Diagnosis Date  . Bronchitis    hx of  . Degenerative joint disease   . Depression   . ENDOMETRIAL POLYP 01/08/2006   Dr. Agnes Lawrence.  2001:  D&C, benign;  07/14/10:  D&C, hysteroscopy, polypectomy and bx of mons pubis (peau d'orange)...... 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  Marland Kitchen GERD (gastroesophageal reflux disease)   . Hernia   . High cholesterol   . Hypertension   . Lipoma   . Obesity      Patient Active Problem List   Diagnosis Date Noted  . Anxiety 04/06/2017  . Acute respiratory failure with hypoxia ([place])   . Acute hypoxemic respiratory failure ([place]) 04/05/2017  . SIRS (systemic inflammatory response syndrome) ([place]) 04/05/2017  . Elevated troponin 04/05/2017  . Hyponatremia 04/05/2017  . Urgency of micturation 04/14/2015  . Dizziness 04/14/2015  . Neck pain 03/31/2015  . Abnormal uterine bleeding 08/29/2012  . Incarcerated ventral hernia 09/11/2010  . COUGH 05/13/2009  . ALLERGIC RHINITIS, SEASONAL 05/26/2008  . SHOULDER PAIN, LEFT 08/11/2007  . PRURITUS, VAGINAL 09/30/2006  . BACK PAIN 08/05/2006  . 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 LIPOMA 01/08/2006  . DYSLIPIDEMIA 01/08/2006  . Obesity, morbid, BMI 50 or higher ([place]) 01/08/2006  . DEPRESSION 01/08/2006  . Essential hypertension 01/08/2006  . BRONCHITIS NOS 01/08/2006  . GERD 01/08/2006  . HERNIA, VENTRAL 01/08/2006  . ENDOMETRIAL POLYP 01/08/2006  . DEGENERATIVE JOINT DISEASE 01/08/2006    Past Surgical History:  Procedure Laterality Date  . CHOLECYSTECTOMY    . DILATION AND CURETTAGE OF UTERUS    . resection of lipoma    . UMBILICAL HERNIA REPAIR       OB History   No obstetric history on file.      Home Medications    Prior to Admission medications  Medication Sig Start Date End Date Taking? Authorizing Provider  albuterol (PROVENTIL HFA;VENTOLIN HFA) 108 (90 Base) MCG/ACT inhaler Inhale 2 puffs into the lungs every 4 (four) hours as needed for wheezing or shortness of breath. 11/29/15  Yes Duffy Bruce, MD  amLODipine (NORVASC) 5 MG tablet Take 5 mg by mouth daily.   Yes [provider]  aspirin (BL ADULT ASPIRIN LOW STRENGTH) 81 MG chewable tablet Chew 81 mg by mouth daily.     Yes [provider]  atorvastatin (LIPITOR) 20 MG tablet Take 20 mg by mouth daily. 03/28/17  Yes [provider]  enalapril (VASOTEC) 20 MG tablet Take 20 mg by mouth daily.     Yes [provider]  escitalopram (LEXAPRO) 10 MG tablet Take 10 mg by mouth daily.   Yes [provider]  fluticasone (FLONASE) 50 MCG/ACT nasal spray Place 2 sprays into both nostrils daily as needed for allergies or rhinitis.  04/04/17  Yes [provider]  ipratropium (ATROVENT) 0.02 % nebulizer solution Take 2.5 mLs (0.5 mg total) by nebulization every 6 (six) hours as needed for wheezing or shortness of breath. 04/08/17  Yes Sheikh, Omair Latif, DO  levalbuterol (XOPENEX) 0.63 MG/3ML nebulizer solution Take 3 mLs (0.63 mg total) by nebulization every 6 (six) hours as needed for wheezing or shortness of breath. 04/08/17  Yes Sheikh, Omair Latif, DO  medroxyPROGESTERone (PROVERA) 10 MG tablet Take 10 mg by mouth daily. 03/26/17  Yes [provider]  meloxicam (MOBIC) 7.5 MG tablet Take 7.5 mg by mouth daily. 04/02/17  Yes [provider]  omeprazole (PRILOSEC) 40 MG capsule Take 40 mg by mouth daily.  12/17/14  Yes [provider]  oxybutynin (DITROPAN-XL) 10 MG 24 hr tablet Take 10 mg by mouth daily. 03/24/17  Yes [provider]  oxyCODONE-acetaminophen (PERCOCET/ROXICET) 5-325 MG tablet Take 1 tablet by mouth every morning.   Yes [provider]  Vitamin D, Ergocalciferol, (DRISDOL) 50000 units CAPS capsule Take 50,000 Units by mouth every Monday.  03/24/17  Yes [provider]  cyclobenzaprine (FLEXERIL) 10 MG tablet Take 1 tablet (10 mg total) by mouth 2 (two) times daily as needed for muscle spasms. 02/10/17   Long, Wonda Olds, MD  gabapentin (NEURONTIN) 300 MG capsule Take 300 mg by mouth at bedtime.  12/21/14   [provider]  guaiFENesin (MUCINEX) 600 MG 12 hr tablet Take 2 tablets (1,200 mg total) by mouth 2 (two) times daily. 04/08/17   Raiford Noble Latif, DO  methocarbamol (ROBAXIN) 500 MG tablet Take 1 tablet (500 mg total) by mouth 2 (two) times daily. 09/07/17   Blanchie Dessert, MD  predniSONE  (DELTASONE) 20 MG tablet Take 2 tablets (40 mg total) by mouth daily. 09/07/17   Blanchie Dessert, MD    Family History Family History  Problem Relation Age of Onset  . Heart disease Mother   . Diabetes Mother   . Cancer Father     Social History Social History   Tobacco Use  . Smoking status: Never Smoker  . Smokeless tobacco: Never Used  Substance Use Topics  . Alcohol use: No    Alcohol/week: 0.0 standard drinks  . Drug use: No     Allergies   Codeine   Review of Systems Review of Systems  All other systems reviewed and are negative.    Physical Exam Updated Vital Signs BP (!) 152/79 (BP Location: Left Arm)   Pulse 95   Temp 97.7 F (36.5 C) (  Oral)   Resp 20   Ht 5\' 4"  (1.626 m)   Wt (!) 163.7 kg   LMP 07/11/2014   SpO2 99%   BMI 61.97 kg/m   Physical Exam Vitals signs and nursing note reviewed.  Constitutional:      General: She is not in acute distress.    Appearance: She is well-developed. She is obese.  HENT:     Head: Normocephalic and atraumatic.  Eyes:     Pupils: Pupils are equal, round, and reactive to light.  Cardiovascular:     Rate and Rhythm: Regular rhythm. Tachycardia present.     Heart sounds: Normal heart sounds. No murmur. No friction rub.  Pulmonary:     Effort: Pulmonary effort is normal.     Breath sounds: Normal breath sounds. No wheezing or rales.  Abdominal:     General: Bowel sounds are normal. There is no distension.     Palpations: Abdomen is soft.     Tenderness: There is no abdominal tenderness. There is no guarding or rebound.  Musculoskeletal: Normal range of motion.        General: Tenderness present.     Lumbar back: She exhibits tenderness and bony tenderness.       Back:     Comments: No edema  Skin:    General: Skin is warm and dry.     Findings: No rash.  Neurological:     Mental Status: She is alert and oriented to person, place, and time.     Cranial Nerves: No cranial nerve deficit.     Sensory:  Sensory deficit present.     Motor: Weakness present.     Comments: Decreased sensation over the bilateral lower ext.  4/5 strength in bilateral lower ext.  1+ reflexes of the left knee and unable to get reflex on the right knee  Psychiatric:        Behavior: Behavior normal.      ED Treatments / Results  Labs (all labs ordered are listed, but only abnormal results are displayed) Labs Reviewed  CBC WITH DIFFERENTIAL/PLATELET  BASIC METABOLIC PANEL    EKG None  Radiology No results found.  Procedures Procedures (including critical care time)  Medications Ordered in ED Medications  oxyCODONE-acetaminophen (PERCOCET/ROXICET) 5-325 MG per tablet 2 tablet (has no administration in time range)     Initial Impression / Assessment and Plan / ED Course  I have reviewed the triage vital signs and the nursing notes.  Pertinent labs & imaging results that were available during my care of the patient were reviewed by me and considered in my medical decision making (see chart for details).     Pt with numbness, weakness and inability to control her urine for the last 8 days. Pt has no infectious sx, hx of CA  or other red flags concerning for pathologic back pain. Pt unable to ambulate due to weakness.  Decreased strength and reflexes on exam.  Denies trauma. Will give pt pain control and will send to [facility] for MRI to r/o cauda equina.  Low suspicion for infectious cause.  Final Clinical Impressions(s) / ED Diagnoses   Final diagnoses:  Leg weakness, bilateral    ED Discharge Orders    None       Blanchie Dessert, MD 02/19/18 2257

## 2018-02-19 NOTE — ED Notes (Signed)
ED Provider at bedside. 

## 2018-02-19 NOTE — ED Notes (Signed)
[facility] notified (Ellen [name]) - patient ready for transport to [facility] ED 

## 2018-02-19 NOTE — ED Triage Notes (Signed)
C/o chronic leg pain-c/o pain is worse and numbness to bilat LE since 12/24

## 2018-02-20 ENCOUNTER — Emergency Department (HOSPITAL_COMMUNITY): Payer: Medicare HMO

## 2018-02-20 DIAGNOSIS — M6281 Muscle weakness (generalized): Secondary | ICD-10-CM | POA: Diagnosis not present

## 2018-02-20 MED ORDER — DEXAMETHASONE SODIUM PHOSPHATE 10 MG/ML IJ SOLN: 10.0000 mg | Freq: Once | INTRAMUSCULAR | Status: DC

## 2018-02-20 MED ORDER — KETOROLAC TROMETHAMINE 15 MG/ML IJ SOLN
15.0000 mg | Freq: Once | INTRAMUSCULAR | Status: AC
  Administered 2018-02-20: 15 mg via INTRAVENOUS
  Filled 2018-02-20: qty 1

## 2018-02-20 MED ORDER — METHOCARBAMOL 500 MG PO TABS: 500.0000 mg | ORAL_TABLET | Freq: Three times a day (TID) | ORAL | 0 refills | Status: DC | PRN

## 2018-02-20 MED ORDER — DEXAMETHASONE SODIUM PHOSPHATE 10 MG/ML IJ SOLN
10.0000 mg | Freq: Once | INTRAMUSCULAR | Status: AC
  Administered 2018-02-20: 10 mg via INTRAMUSCULAR
  Filled 2018-02-20: qty 1

## 2018-02-20 NOTE — ED Notes (Signed)
Pt returned from MRI °

## 2018-02-20 NOTE — ED Notes (Signed)
Pt states even with assistance cant walk d/t pain in middle and lower back. Stood to feet and fell back on bed.

## 2018-02-20 NOTE — ED Provider Notes (Signed)
Discussion with case management. Home with walker and home PT. Orders placed in computer    B[place], MD 02/20/18 [id number]

## 2018-02-20 NOTE — ED Provider Notes (Signed)
Sent from [facility] for MRI lumbar to r/o Cauda Equina. MRI ordered.  MRI w/o acute changes.  Attempted to ambulate the patient but pain interferes with ambulation.  She was given dose of Toradol which provided some relief but still has difficulty ambulating.  She does not meet inpatient criteria.  Will discuss case with case management to help with home health and physical therapy.   Fatima Blank, MD 02/20/18 [phone]

## 2018-02-20 NOTE — Care Management Note (Signed)
Case Management Note  Patient Details  Name: Ellen [name] MRN: [id number] Date of Birth: 1961-01-17  CM consulted for [facility].  CM spoke with Dr. Venora Maples who received hand-off on the pt.  CM advised that pt would only receive 5 HH visits and that is her limit for the next year.  He reported he would speak with the pt and contact CM again if he felt that she still needed HH.  CM received a return call from Dr. Venora Maples who advised that pt wanted the [facility] at this time and is aware of the limited number of visits.  He reports he will also order a rolling walker.  CM stated that due to her ins, CM would have to find an [facility] agency that would be willing to accept her instead of offering choice.  CM discussed modifying HH orders so that only PT and SW were ordered.  Dr. Venora Maples agreeable.  CM contacted Jermaine with [facility] for DME rolling walker which will be delivered to pt prior to transition home.  CM contacted Butch Penny with [facility] who was able to accept pt for [facility] needs.  Information placed on AVS.  No further CM needs noted at this time.  Expected Discharge Date:   02/20/2018               Expected Discharge Plan:   Home with home health.  DME Arranged:   Vassie Moselle DME Agency:   [facility]  HH Arranged:   PT SW [facility] Agency:   [facility]  Status of Service:   Completed, signed off.  Peytan Andringa, Benjaman Lobe, RN 02/20/2018, 9:09 AM

## 2018-02-20 NOTE — ED Notes (Signed)
ED Provider at bedside. 

## 2018-05-30 ENCOUNTER — Emergency Department (HOSPITAL_COMMUNITY): Payer: Medicare HMO

## 2018-05-30 ENCOUNTER — Other Ambulatory Visit: Payer: Self-pay

## 2018-05-30 ENCOUNTER — Emergency Department (HOSPITAL_COMMUNITY)
Admission: EM | Admit: 2018-05-30 | Discharge: 2018-05-30 | Disposition: A | Discharge status: Home / Self Care | Payer: Medicare HMO | Attending: Emergency Medicine | Admitting: Emergency Medicine

## 2018-05-30 ENCOUNTER — Encounter (HOSPITAL_COMMUNITY): Payer: Self-pay | Admitting: Emergency Medicine

## 2018-05-30 DIAGNOSIS — I10 Essential (primary) hypertension: Secondary | ICD-10-CM | POA: Insufficient documentation

## 2018-05-30 DIAGNOSIS — M25561 Pain in right knee: Secondary | ICD-10-CM | POA: Insufficient documentation

## 2018-05-30 DIAGNOSIS — Z6841 Body Mass Index (BMI) 40.0 and over, adult: Secondary | ICD-10-CM | POA: Diagnosis not present

## 2018-05-30 DIAGNOSIS — M25562 Pain in left knee: Secondary | ICD-10-CM | POA: Insufficient documentation

## 2018-05-30 DIAGNOSIS — Z79899 Other long term (current) drug therapy: Secondary | ICD-10-CM | POA: Insufficient documentation

## 2018-05-30 DIAGNOSIS — Z7982 Long term (current) use of aspirin: Secondary | ICD-10-CM | POA: Diagnosis not present

## 2018-05-30 DIAGNOSIS — R0789 Other chest pain: Secondary | ICD-10-CM | POA: Insufficient documentation

## 2018-05-30 LAB — CBC
HCT: 40.9 % (ref 36.0–46.0)
Hemoglobin: 12.8 g/dL (ref 12.0–15.0)
MCH: 30.4 pg (ref 26.0–34.0)
MCHC: 31.3 g/dL (ref 30.0–36.0)
MCV: 97.1 fL (ref 80.0–100.0)
Platelets: 277 10*3/uL (ref 150–400)
RBC: 4.21 MIL/uL (ref 3.87–5.11)
RDW: 11.6 % (ref 11.5–15.5)
WBC: 7.7 10*3/uL (ref 4.0–10.5)
nRBC: 0 % (ref 0.0–0.2)

## 2018-05-30 LAB — TROPONIN I
Troponin I: 0.06 ng/mL (ref <0.03)
Troponin I: 0.06 ng/mL (ref <0.03)

## 2018-05-30 LAB — BASIC METABOLIC PANEL
Anion gap: 9 (ref 5–15)
BUN: 11 mg/dL (ref 6–20)
CO2: 24 mmol/L (ref 22–32)
Calcium: 12.6 mg/dL — ABNORMAL HIGH (ref 8.9–10.3)
Chloride: 107 mmol/L (ref 98–111)
Creatinine, Ser: 0.62 mg/dL (ref 0.44–1.00)
GFR calc Af Amer: >60 mL/min (ref >60)
GFR calc non Af Amer: >60 mL/min (ref >60)
Glucose, Bld: 123 mg/dL — ABNORMAL HIGH (ref 70–99)
Potassium: 4 mmol/L (ref 3.5–5.1)
Sodium: 140 mmol/L (ref 135–145)

## 2018-05-30 LAB — D-DIMER, QUANTITATIVE: D-Dimer, Quant: 1.21 ug/mL-FEU — ABNORMAL HIGH (ref 0.00–0.50)

## 2018-05-30 MED ORDER — IOHEXOL 350 MG/ML SOLN
100.0000 mL | Freq: Once | INTRAVENOUS | Status: AC | PRN
  Administered 2018-05-30: 100 mL via INTRAVENOUS

## 2018-05-30 MED ORDER — ACETAMINOPHEN 325 MG PO TABS
650.0000 mg | ORAL_TABLET | Freq: Once | ORAL | Status: AC
  Administered 2018-05-30: 650 mg via ORAL
  Filled 2018-05-30: qty 2

## 2018-05-30 NOTE — ED Provider Notes (Signed)
[place] EMERGENCY DEPARTMENT Provider Note   CSN: [id number] Arrival date & time: 05/30/18  1337    History   Chief Complaint Chief Complaint  Patient presents with  . Chest Pain  . Leg Pain    HPI Ellen [name] is a 58 y.o. female.  With a history of morbid obesity.  She is unable to ambulate due to what she describes as "a swollen lower back."  She is also complaining of bilateral knee pain and presents to the emergency department for chest pain.  Patient states that she has sharp intermittent chest pain lasting a few seconds at a time she denies shortness of breath change in pain with position or radiation.  She denies nausea, vomiting, diaphoresis.  Patient states "I just have not ever felt a pain like that is.  She denies pleuritic chest pain, unilateral leg swelling.  She denies a history of PE or DVT.  She has no nausea.  Patient has a history of hypertension high cholesterol but denies history of diabetes or coronary artery disease.  She denies a family history of primary relative with MI.     HPI  Past Medical History:  Diagnosis Date  . Bronchitis    hx of  . Degenerative joint disease   . Depression   . ENDOMETRIAL POLYP 01/08/2006   Dr. Agnes Lawrence.  2001:  D&C, benign;  07/14/10:  D&C, hysteroscopy, polypectomy and bx of mons pubis (peau d'orange)...... 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  Marland Kitchen GERD (gastroesophageal reflux disease)   . Hernia   . High cholesterol   . Hypertension   . Lipoma   . Obesity     Patient Active Problem List   Diagnosis Date Noted  . Anxiety 04/06/2017  . Acute respiratory failure with hypoxia ([place])   . Acute hypoxemic respiratory failure ([place]) 04/05/2017  . SIRS (systemic inflammatory response syndrome) ([place]) 04/05/2017  . Elevated troponin 04/05/2017  . Hyponatremia 04/05/2017  . Urgency of micturation 04/14/2015  . Dizziness 04/14/2015  . Neck pain 03/31/2015  . Abnormal  uterine bleeding 08/29/2012  . Incarcerated ventral hernia 09/11/2010  . COUGH 05/13/2009  . ALLERGIC RHINITIS, SEASONAL 05/26/2008  . SHOULDER PAIN, LEFT 08/11/2007  . PRURITUS, VAGINAL 09/30/2006  . BACK PAIN 08/05/2006  . 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 LIPOMA 01/08/2006  . DYSLIPIDEMIA 01/08/2006  . Obesity, morbid, BMI 50 or higher ([place]) 01/08/2006  . DEPRESSION 01/08/2006  . Essential hypertension 01/08/2006  . BRONCHITIS NOS 01/08/2006  . GERD 01/08/2006  . HERNIA, VENTRAL 01/08/2006  . ENDOMETRIAL POLYP 01/08/2006  . DEGENERATIVE JOINT DISEASE 01/08/2006    Past Surgical History:  Procedure Laterality Date  . CHOLECYSTECTOMY    . DILATION AND CURETTAGE OF UTERUS    . resection of lipoma    . UMBILICAL HERNIA REPAIR       OB History   No obstetric history on file.      Home Medications    Prior to Admission medications   Medication Sig Start Date End Date Taking? Authorizing Provider  albuterol (PROVENTIL HFA;VENTOLIN HFA) 108 (90 Base) MCG/ACT inhaler Inhale 2 puffs into the lungs every 4 (four) hours as needed for wheezing or shortness of breath. 11/29/15   Duffy Bruce, MD  amLODipine (NORVASC) 5 MG tablet Take 5 mg by mouth daily.    [provider]  aspirin (BL ADULT ASPIRIN LOW STRENGTH) 81 MG chewable tablet Chew 81 mg by mouth daily.  [provider]  atorvastatin (LIPITOR) 20 MG tablet Take 20 mg by mouth daily. 03/28/17   [provider]  cyclobenzaprine (FLEXERIL) 10 MG tablet Take 1 tablet (10 mg total) by mouth 2 (two) times daily as needed for muscle spasms. 02/10/17   Long, Wonda Olds, MD  enalapril (VASOTEC) 20 MG tablet Take 20 mg by mouth daily.     [provider]  escitalopram (LEXAPRO) 10 MG tablet Take 10 mg by mouth daily.    [provider]  fluticasone (FLONASE) 50 MCG/ACT nasal spray Place 2 sprays into both nostrils daily as needed for allergies or rhinitis.  04/04/17   [provider]  gabapentin (NEURONTIN) 300 MG capsule Take 300 mg by mouth at bedtime.  12/21/14   [provider]  guaiFENesin (MUCINEX) 600 MG 12 hr tablet Take 2 tablets (1,200 mg total) by mouth 2 (two) times daily. 04/08/17   Raiford Noble Latif, DO  ipratropium (ATROVENT) 0.02 % nebulizer solution Take 2.5 mLs (0.5 mg total) by nebulization every 6 (six) hours as needed for wheezing or shortness of breath. 04/08/17   Sheikh, Omair Latif, DO  levalbuterol (XOPENEX) 0.63 MG/3ML nebulizer solution Take 3 mLs (0.63 mg total) by nebulization every 6 (six) hours as needed for wheezing or shortness of breath. 04/08/17   Raiford Noble Latif, DO  medroxyPROGESTERone (PROVERA) 10 MG tablet Take 10 mg by mouth daily. 03/26/17   [provider]  meloxicam (MOBIC) 7.5 MG tablet Take 7.5 mg by mouth daily. 04/02/17   [provider]  methocarbamol (ROBAXIN) 500 MG tablet Take 1 tablet (500 mg total) by mouth every 8 (eight) hours as needed for muscle spasms. 02/20/18   Jola Schmidt, MD  omeprazole (PRILOSEC) 40 MG capsule Take 40 mg by mouth daily.  12/17/14   [provider]  oxybutynin (DITROPAN-XL) 10 MG 24 hr tablet Take 10 mg by mouth daily. 03/24/17   [provider]  oxyCODONE-acetaminophen (PERCOCET/ROXICET) 5-325 MG tablet Take 1 tablet by mouth at bedtime.     [provider]  predniSONE (DELTASONE) 20 MG tablet Take 2 tablets (40 mg total) by mouth daily. 09/07/17   Blanchie Dessert, MD  Vitamin D, Ergocalciferol, (DRISDOL) 50000 units CAPS capsule Take 50,000 Units by mouth every Monday.  03/24/17   [provider]    Family History Family History  Problem Relation Age of Onset  . Heart disease Mother   . Diabetes Mother   . Cancer Father     Social History Social History   Tobacco Use  . Smoking status: Never Smoker  . Smokeless tobacco: Never Used  Substance Use Topics  . Alcohol use: No    Alcohol/week: 0.0 standard drinks  .  Drug use: No     Allergies   Codeine   Review of Systems Review of Systems  Ten systems reviewed and are negative for acute change, except as noted in the HPI.   Physical Exam Updated Vital Signs BP 117/64   Pulse 93   Temp 98.7 F (37.1 C) (Oral)   Resp 14   Ht 5\' 4"  (1.626 m)   Wt (!) 158.8 kg   LMP 07/11/2014   SpO2 98%   BMI 60.08 kg/m   Physical Exam Vitals signs and nursing note reviewed.  Constitutional:      General: She is not in acute distress.    Appearance: She is well-developed. She is not diaphoretic.  HENT:     Head: Normocephalic and  atraumatic.  Eyes:     General: No scleral icterus.    Extraocular Movements: Extraocular movements intact.     Conjunctiva/sclera: Conjunctivae normal.     Pupils: Pupils are equal, round, and reactive to light.  Neck:     Musculoskeletal: Normal range of motion.  Cardiovascular:     Rate and Rhythm: Normal rate and regular rhythm.     Heart sounds: Normal heart sounds. No murmur. No friction rub. No gallop.   Pulmonary:     Effort: Pulmonary effort is normal. No respiratory distress.     Breath sounds: Normal breath sounds.  Abdominal:     General: Bowel sounds are normal. There is no distension.     Palpations: Abdomen is soft. There is no mass.     Tenderness: There is no abdominal tenderness. There is no guarding.  Musculoskeletal:     Right lower leg: She exhibits no tenderness. No edema.     Left lower leg: She exhibits no tenderness. No edema.     Comments: Bilateral knee tenderness and minimal effusion.  Pain with flexion and extension  Skin:    General: Skin is warm and dry.  Neurological:     Mental Status: She is alert and oriented to person, place, and time.  Psychiatric:        Behavior: Behavior normal.      ED Treatments / Results  Labs (all labs ordered are listed, but only abnormal results are displayed) Labs Reviewed  CBC  BASIC METABOLIC PANEL  TROPONIN I    EKG None   Radiology No results found.  Procedures Procedures (including critical care time)  Medications Ordered in ED Medications - No data to display   Initial Impression / Assessment and Plan / ED Course  I have reviewed the triage vital signs and the nursing notes.  Pertinent labs & imaging results that were available during my care of the patient were reviewed by me and considered in my medical decision making (see chart for details).  Clinical Course as of May 30 745  Fri May 30, 2018  2020 Unchanged  Troponin I(!!): 0.06 [EH]  2058 Bilateral patchy groundglass opacities.  In the absence of shortness of breath, fever, sore throat, fatigue, or body aches do not feel that this acutely represents pneumonia, radiologist appears to favor not fully inhaling.  CT Angio Chest PE W and/or Wo Contrast [EH]    Clinical Course User Index [EH] Lorin Glass, PA-C       Morbidly obese female with cp.  The emergent differential diagnosis of chest pain includes: Acute coronary syndrome, pericarditis, aortic dissection, pulmonary embolism, tension pneumothorax, pneumonia, and esophageal rupture. She has a chronically elevated troponin level, and today is no exception. Today's troponin is actually lower than normal. Her d- dimer is elevated after recent hospital admission and chronic non-ambulatory status.  Patient will need a repeat troponin level and and CTA chest to r/o PE.  Plan d/c home with home health if her ct is negative and no elevation in  Her  troponin levels. I have given sign out to [facility].   Final Clinical Impressions(s) / ED Diagnoses   Final diagnoses:  None    ED Discharge Orders    None       Margarita Mail, PA-C 05/31/18 [id number]    Virgel Manifold, MD 05/31/18 1220

## 2018-05-30 NOTE — Care Management (Signed)
This patient is active with [facility] Nursing,PT, OT, ED CM will follow for transitional care planning.

## 2018-05-30 NOTE — Discharge Instructions (Addendum)
If your pain returns and remains constant, it moves up your neck, down your arm, you develop nausea, new shortness of breath or have additional concerns please seek additional medical care and evaluation.  As we discussed during times of pandemic it is important that you, and anyone you come in contact with, are quarantining yourself at home and only venturing out when needed for things that are absolutely necessary such as brief trips to the grocery store and to seek medical care.    Please schedule a follow-up appointment with your primary care doctor.  I have given you the information for cardiology, please call them to schedule an appointment.

## 2018-05-30 NOTE — ED Notes (Signed)
Pt. Placed on purewick.  

## 2018-05-30 NOTE — ED Notes (Signed)
Patient verbalizes understanding of medications and discharge instructions. No further questions at this time. VSS.   Waiting for transport ([facility]) at this time.

## 2018-05-30 NOTE — ED Triage Notes (Signed)
Pt BIB [facility] complaining of substernal chest pain that worsens with activity x1 day.   324 ASA given by EMS  BP 126/74 HR 92 99% room air

## 2018-05-30 NOTE — ED Provider Notes (Signed)
I assumed care of patient from Margarita Mail, PA-C at shift change, please see her note for full H&P.  Briefly patient is here for substernal chest pain that is intermittent lasting "a few seconds" without shortness of breath change in pain with position or radiation.  She denies any unilateral leg swelling.  She has a history of hypertension, and morbid obesity however does not have a history of diabetes or coronary artery disease.  Plan is to follow-up on CT angios PE study for elevated d-dimer.  Her troponin is also elevated at 0.06.  Plan to repeat troponin.  She normally has a slightly elevated troponin and this is consistent with her baseline.   Physical Exam  BP 117/64   Pulse 93   Temp 98.7 F (37.1 C) (Oral)   Resp 14   Ht 5\' 4"  (1.626 m)   Wt (!) 158.8 kg   LMP 07/11/2014   SpO2 98%   BMI 60.08 kg/m   Physical Exam  ED Course/Procedures   Clinical Course as of May 29 2100  Fri May 30, 2018  2020 Unchanged  Troponin I(!!): 0.06 [EH]  2058 Bilateral patchy groundglass opacities.  In the absence of shortness of breath, fever, sore throat, fatigue, or body aches do not feel that this acutely represents pneumonia, radiologist appears to favor not fully inhaling.  CT Angio Chest PE W and/or Wo Contrast [EH]    Clinical Course User Index [EH] Lorin Glass, PA-C    Procedures  Dg Chest 2 View  Result Date: 05/30/2018 CLINICAL DATA:  Chest pain EXAM: CHEST - 2 VIEW COMPARISON:  None. FINDINGS: The heart size and mediastinal contours are within normal limits. Both lungs are clear. The visualized skeletal structures are unremarkable. IMPRESSION: No active cardiopulmonary disease. Electronically Signed   By: Kathreen Devoid   On: 05/30/2018 15:48   Ct Angio Chest Pe W And/or Wo Contrast  Result Date: 05/30/2018 CLINICAL DATA:  Chest pain. EXAM: CT ANGIOGRAPHY CHEST WITH CONTRAST TECHNIQUE: Multidetector CT imaging of the chest was performed using the standard protocol  during bolus administration of intravenous contrast. Multiplanar CT image reconstructions and MIPs were obtained to evaluate the vascular anatomy. CONTRAST:  171mL OMNIPAQUE IOHEXOL 350 MG/ML SOLN COMPARISON:  Chest two-view 05/30/2018 FINDINGS: Cardiovascular: Mild cardiac enlargement. No coronary calcification. No pericardial effusion. Negative for pulmonary embolism. Normal thoracic aorta. Mediastinum/Nodes: Negative for mass or adenopathy Lungs/Pleura: Mild diffuse patchy ground-glass density throughout both lungs, symmetrically. No focal consolidation and no effusion. Upper Abdomen: Hepatomegaly with probable fatty infiltration. No acute abnormality Musculoskeletal: Thoracic disc degeneration and spurring. No acute skeletal abnormality. Review of the MIP images confirms the above findings. IMPRESSION: 1. Negative for pulmonary embolism.  No acute abnormality 2. Mild patchy ground-glass density throughout both lungs may be due to expiration. No focal consolidation. Electronically Signed   By: Franchot Gallo M.D.   On: 05/30/2018 18:39    Labs Reviewed  BASIC METABOLIC PANEL - Abnormal; Notable for the following components:      Result Value   Glucose, Bld 123 (*)    Calcium 12.6 (*)    All other components within normal limits  TROPONIN I - Abnormal; Notable for the following components:   Troponin I 0.06 (*)    All other components within normal limits  D-DIMER, QUANTITATIVE (NOT AT [facility]) - Abnormal; Notable for the following components:   D-Dimer, Quant 1.21 (*)    All other components within normal limits  TROPONIN I - Abnormal; Notable  for the following components:   Troponin I 0.06 (*)    All other components within normal limits  CBC     MDM   CTA, repeat troponin if not higher than d/c home.   Patient CT without evidence of PE or other acute abnormalities.  Does have mild patchy groundglass density throughout both lungs which are felt to be due to expiration by radiology.  She  does not have fever, cough, known corona virus contact and is completely bed bound (per her) at baseline.  Given lack of fever, cough, shortness of breath, do not suspect pneumonia.  Her troponin remained stable at 0.06, consistent with her baseline elevated troponin.  Given the atypical nature of her pain do not suspect ACS.  We explicitly discussed return precautions, and the recommendation to establish care with cardiology.  Return precautions were discussed with patient who states their understanding.  At the time of discharge patient denied any unaddressed complaints or concerns.  Patient is agreeable for discharge home.          Ollen Gross 05/30/18 2121    Jola Schmidt, MD 05/30/18 [phone]

## 2018-06-17 ENCOUNTER — Other Ambulatory Visit: Payer: Self-pay

## 2018-06-17 ENCOUNTER — Encounter (HOSPITAL_COMMUNITY): Payer: Self-pay | Admitting: Emergency Medicine

## 2018-06-17 ENCOUNTER — Inpatient Hospital Stay (HOSPITAL_COMMUNITY)
Admission: EM | Admit: 2018-06-17 | Discharge: 2018-06-27 | DRG: 519 | Disposition: A | Discharge status: Skilled Nursing Facility | Payer: Medicare HMO | Source: Skilled Nursing Facility | Attending: Internal Medicine | Admitting: Internal Medicine

## 2018-06-17 DIAGNOSIS — Z9181 History of falling: Secondary | ICD-10-CM | POA: Diagnosis not present

## 2018-06-17 DIAGNOSIS — I10 Essential (primary) hypertension: Secondary | ICD-10-CM | POA: Diagnosis present

## 2018-06-17 DIAGNOSIS — Z885 Allergy status to narcotic agent status: Secondary | ICD-10-CM

## 2018-06-17 DIAGNOSIS — R739 Hyperglycemia, unspecified: Secondary | ICD-10-CM | POA: Diagnosis not present

## 2018-06-17 DIAGNOSIS — K219 Gastro-esophageal reflux disease without esophagitis: Secondary | ICD-10-CM | POA: Diagnosis present

## 2018-06-17 DIAGNOSIS — Z79899 Other long term (current) drug therapy: Secondary | ICD-10-CM

## 2018-06-17 DIAGNOSIS — F329 Major depressive disorder, single episode, unspecified: Secondary | ICD-10-CM | POA: Diagnosis present

## 2018-06-17 DIAGNOSIS — R29898 Other symptoms and signs involving the musculoskeletal system: Secondary | ICD-10-CM | POA: Diagnosis not present

## 2018-06-17 DIAGNOSIS — E785 Hyperlipidemia, unspecified: Secondary | ICD-10-CM | POA: Diagnosis present

## 2018-06-17 DIAGNOSIS — Z6841 Body Mass Index (BMI) 40.0 and over, adult: Secondary | ICD-10-CM

## 2018-06-17 DIAGNOSIS — M47814 Spondylosis without myelopathy or radiculopathy, thoracic region: Secondary | ICD-10-CM | POA: Diagnosis present

## 2018-06-17 DIAGNOSIS — Z79891 Long term (current) use of opiate analgesic: Secondary | ICD-10-CM

## 2018-06-17 DIAGNOSIS — N390 Urinary tract infection, site not specified: Secondary | ICD-10-CM | POA: Diagnosis not present

## 2018-06-17 DIAGNOSIS — Z7951 Long term (current) use of inhaled steroids: Secondary | ICD-10-CM

## 2018-06-17 DIAGNOSIS — Z7952 Long term (current) use of systemic steroids: Secondary | ICD-10-CM

## 2018-06-17 DIAGNOSIS — M4802 Spinal stenosis, cervical region: Secondary | ICD-10-CM | POA: Diagnosis present

## 2018-06-17 DIAGNOSIS — G4733 Obstructive sleep apnea (adult) (pediatric): Secondary | ICD-10-CM | POA: Diagnosis present

## 2018-06-17 DIAGNOSIS — D649 Anemia, unspecified: Secondary | ICD-10-CM | POA: Diagnosis present

## 2018-06-17 DIAGNOSIS — Z419 Encounter for procedure for purposes other than remedying health state, unspecified: Secondary | ICD-10-CM

## 2018-06-17 DIAGNOSIS — R32 Unspecified urinary incontinence: Secondary | ICD-10-CM

## 2018-06-17 DIAGNOSIS — Z7982 Long term (current) use of aspirin: Secondary | ICD-10-CM

## 2018-06-17 DIAGNOSIS — M47812 Spondylosis without myelopathy or radiculopathy, cervical region: Secondary | ICD-10-CM | POA: Diagnosis present

## 2018-06-17 DIAGNOSIS — Z8249 Family history of ischemic heart disease and other diseases of the circulatory system: Secondary | ICD-10-CM

## 2018-06-17 DIAGNOSIS — M47816 Spondylosis without myelopathy or radiculopathy, lumbar region: Secondary | ICD-10-CM | POA: Diagnosis present

## 2018-06-17 DIAGNOSIS — G9589 Other specified diseases of spinal cord: Secondary | ICD-10-CM | POA: Diagnosis present

## 2018-06-17 DIAGNOSIS — Z833 Family history of diabetes mellitus: Secondary | ICD-10-CM

## 2018-06-17 DIAGNOSIS — Z1159 Encounter for screening for other viral diseases: Secondary | ICD-10-CM | POA: Diagnosis not present

## 2018-06-17 DIAGNOSIS — M545 Low back pain, unspecified: Secondary | ICD-10-CM

## 2018-06-17 DIAGNOSIS — M48061 Spinal stenosis, lumbar region without neurogenic claudication: Secondary | ICD-10-CM | POA: Diagnosis present

## 2018-06-17 DIAGNOSIS — M549 Dorsalgia, unspecified: Secondary | ICD-10-CM | POA: Diagnosis present

## 2018-06-17 DIAGNOSIS — R296 Repeated falls: Secondary | ICD-10-CM | POA: Diagnosis present

## 2018-06-17 DIAGNOSIS — G8221 Paraplegia, complete: Secondary | ICD-10-CM | POA: Diagnosis present

## 2018-06-17 DIAGNOSIS — R509 Fever, unspecified: Secondary | ICD-10-CM

## 2018-06-17 DIAGNOSIS — G8929 Other chronic pain: Secondary | ICD-10-CM | POA: Diagnosis not present

## 2018-06-17 DIAGNOSIS — M4804 Spinal stenosis, thoracic region: Principal | ICD-10-CM | POA: Diagnosis present

## 2018-06-17 DIAGNOSIS — Z9049 Acquired absence of other specified parts of digestive tract: Secondary | ICD-10-CM

## 2018-06-17 HISTORY — DX: Sleep apnea, unspecified: G47.30

## 2018-06-17 LAB — CBC WITH DIFFERENTIAL/PLATELET
Abs Immature Granulocytes: 0.01 10*3/uL (ref 0.00–0.07)
Basophils Absolute: 0 10*3/uL (ref 0.0–0.1)
Basophils Relative: 0 %
Eosinophils Absolute: 0.2 10*3/uL (ref 0.0–0.5)
Eosinophils Relative: 3 %
HCT: 35.6 % — ABNORMAL LOW (ref 36.0–46.0)
Hemoglobin: 11.6 g/dL — ABNORMAL LOW (ref 12.0–15.0)
Immature Granulocytes: 0 %
Lymphocytes Relative: 36 %
Lymphs Abs: 2.5 10*3/uL (ref 0.7–4.0)
MCH: 31.9 pg (ref 26.0–34.0)
MCHC: 32.6 g/dL (ref 30.0–36.0)
MCV: 97.8 fL (ref 80.0–100.0)
Monocytes Absolute: 0.9 10*3/uL (ref 0.1–1.0)
Monocytes Relative: 13 %
Neutro Abs: 3.3 10*3/uL (ref 1.7–7.7)
Neutrophils Relative %: 48 %
Platelets: 298 10*3/uL (ref 150–400)
RBC: 3.64 MIL/uL — ABNORMAL LOW (ref 3.87–5.11)
RDW: 11.8 % (ref 11.5–15.5)
WBC: 6.8 10*3/uL (ref 4.0–10.5)
nRBC: 0 % (ref 0.0–0.2)

## 2018-06-17 LAB — BASIC METABOLIC PANEL
Anion gap: 5 (ref 5–15)
BUN: 13 mg/dL (ref 6–20)
CO2: 29 mmol/L (ref 22–32)
Calcium: 12 mg/dL — ABNORMAL HIGH (ref 8.9–10.3)
Chloride: 107 mmol/L (ref 98–111)
Creatinine, Ser: 0.66 mg/dL (ref 0.44–1.00)
GFR calc Af Amer: >60 mL/min (ref >60)
GFR calc non Af Amer: >60 mL/min (ref >60)
Glucose, Bld: 108 mg/dL — ABNORMAL HIGH (ref 70–99)
Potassium: 3.6 mmol/L (ref 3.5–5.1)
Sodium: 141 mmol/L (ref 135–145)

## 2018-06-17 MED ORDER — SODIUM CHLORIDE 0.9 % IV SOLN
INTRAVENOUS | Status: DC
  Administered 2018-06-18: 01:00:00 via INTRAVENOUS

## 2018-06-17 NOTE — H&P (Addendum)
TRH H&P    Patient Demographics:    Ellen [name], is a 58 y.o. female  MRN: [id number]  DOB - [birth year]  Admit Date - 06/17/2018  Referring MD/NP/PA:  Providence Lanius  Outpatient Primary MD for the patient is Deborah Chalk, FNP  Patient coming from:  SNF, [place]  Chief complaint-  Back pain   HPI:    Ellen [name]  is a 58 y.o. female, w hypertension, hyperlipidemia, Gerd, OSA on cpap, Depression apparently presents with c/o increase in back pain and inability to walk.  Pt apparently had fall slightly over 1 month ago, and was admitted to [facility] and had MRI that showed  C-spine  IMPRESSION: Cervical spine: 1. No abnormal cervical cord signal. 2. Unchanged multilevel cervical spondylosis as described above with mild spinal canal stenosis and mass effect on the ventral cord from C3-C4 through C6-C7. Associated moderate to severe bilateral neuroforaminal stenosis. 3. Unchanged marrow edema within the bilateral C7 pars interarticularis, likely related to severe bilateral C7-T1 facet arthropathy.  Thoracic spine:  1. Unchanged focal abnormal thoracic cord signal at T1-T2 without definite spinal cord enhancement, potentially reflecting contusion given recent falls or chronic myelomalacia given moderate spinal canal stenosis at this level. 2. There is mild posterior epidural enhancement at T1-T2 with unchanged marrow edema/enhancement of the T1 and T2 spinous processes and bilateral transverse processes which may be related to acute degenerative inflammatory changes of the severely degenerated bilateral T1-T2 facet joints. Infection is be unlikely given symmetric involvement of both facet joints, similar appearance of the bilateral C7-T1 facet joints, and lack of significant surrounding soft tissue inflammatory changes. A also, the T2 posterior element signal has been  present since January, again favoring a degenerative etiology. 3. Unchanged multilevel thoracic spondylosis as described above.  Lumbar spine:  1. No acute abnormality. 2. Multilevel lumbar spondylosis as described above, progressed at L3-L4 where there is now moderate spinal canal and right lateral recess stenosis with slightly worsened moderate bilateral neuroforaminal stenosis. 3. Unchanged moderate spinal canal and severe right lateral recess stenosis at L4-L5.   Pt states that her legs have become increasing weak w associated numbness.  Pt also notes incontinence.    Pt was evaluated by neurosurgery in ED, and they recommended admission for surgery on Thursday per ED, and requested medicine admission.     Review of systems:    In addition to the HPI above,  No Fever-chills, No Headache, No changes with Vision or hearing, No problems swallowing food or Liquids, No Chest pain, Cough or Shortness of Breath, No Abdominal pain, No Nausea or Vomiting, bowel movements are regular, No Blood in stool or Urine, No dysuria, No new skin rashes or bruises, No new joints pains-aches,   No recent weight gain or loss, No polyuria, polydypsia or polyphagia, No significant Mental Stressors.  All other systems reviewed and are negative.    Past History of the following :    Past Medical History:  Diagnosis Date  . Bronchitis    hx  of  . Degenerative joint disease   . Depression   . ENDOMETRIAL POLYP 01/08/2006   Dr. Agnes Lawrence.  2001:  D&C, benign;  07/14/10:  D&C, hysteroscopy, polypectomy and bx of mons pubis (peau d'orange)...... 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  Marland Kitchen GERD (gastroesophageal reflux disease)   . Hernia   . High cholesterol   . Hypertension   . Lipoma   . Obesity       Past Surgical History:  Procedure Laterality Date  . CHOLECYSTECTOMY    . DILATION AND CURETTAGE OF UTERUS    . resection of lipoma    . UMBILICAL HERNIA  REPAIR        Social History:      Social History   Tobacco Use  . Smoking status: Never Smoker  . Smokeless tobacco: Never Used  Substance Use Topics  . Alcohol use: No    Alcohol/week: 0.0 standard drinks       Family History :     Family History  Problem Relation Age of Onset  . Heart disease Mother   . Diabetes Mother   . Cancer Father        Home Medications:   Prior to Admission medications   Medication Sig Start Date End Date Taking? Authorizing Provider  albuterol (PROVENTIL HFA;VENTOLIN HFA) 108 (90 Base) MCG/ACT inhaler Inhale 2 puffs into the lungs every 4 (four) hours as needed for wheezing or shortness of breath. Patient not taking: Reported on 05/30/2018 11/29/15   Duffy Bruce, MD  amLODipine (NORVASC) 5 MG tablet Take 5 mg by mouth daily.    [provider]  aspirin (BL ADULT ASPIRIN LOW STRENGTH) 81 MG chewable tablet Chew 81 mg by mouth daily.      [provider]  atorvastatin (LIPITOR) 20 MG tablet Take 20 mg by mouth daily at 6 PM.  03/28/17   [provider]  cyclobenzaprine (FLEXERIL) 10 MG tablet Take 1 tablet (10 mg total) by mouth 2 (two) times daily as needed for muscle spasms. Patient not taking: Reported on 05/30/2018 02/10/17   Long, Wonda Olds, MD  diclofenac sodium (VOLTAREN) 1 % GEL Apply 2-4 g topically every 2 (two) hours as needed (for bilateral knee pain).    [provider]  enalapril (VASOTEC) 20 MG tablet Take 20 mg by mouth daily.     [provider]  escitalopram (LEXAPRO) 10 MG tablet Take 10 mg by mouth daily.    [provider]  fluticasone (FLONASE) 50 MCG/ACT nasal spray Place 2 sprays into both nostrils daily as needed for allergies or rhinitis.  04/04/17   [provider]  guaiFENesin (MUCINEX) 600 MG 12 hr tablet Take 2 tablets (1,200 mg total) by mouth 2 (two) times daily. Patient taking differently: Take 1,200 mg by mouth 2 (two) times daily as needed for cough  or to loosen phlegm.  04/08/17   Raiford Noble Latif, DO  ipratropium (ATROVENT) 0.02 % nebulizer solution Take 2.5 mLs (0.5 mg total) by nebulization every 6 (six) hours as needed for wheezing or shortness of breath. 04/08/17   Sheikh, Omair Latif, DO  levalbuterol (XOPENEX) 0.63 MG/3ML nebulizer solution Take 3 mLs (0.63 mg total) by nebulization every 6 (six) hours as needed for wheezing or shortness of breath. 04/08/17   Raiford Noble Latif, DO  medroxyPROGESTERone (PROVERA) 5 MG tablet Take 5 mg by mouth daily.    [provider]  methocarbamol (ROBAXIN) 500 MG tablet Take 1 tablet (  500 mg total) by mouth every 8 (eight) hours as needed for muscle spasms. Patient not taking: Reported on 05/30/2018 02/20/18   Jola Schmidt, MD  omeprazole (PRILOSEC) 20 MG capsule Take 20 mg by mouth daily before breakfast.    [provider]  oxybutynin (DITROPAN-XL) 10 MG 24 hr tablet Take 10 mg by mouth daily. 03/24/17   [provider]  oxyCODONE-acetaminophen (PERCOCET/ROXICET) 5-325 MG tablet Take 1 tablet by mouth at bedtime.     [provider]  polyethylene glycol powder (GLYCOLAX/MIRALAX) 17 GM/SCOOP powder Take 17 g by mouth daily as needed for mild constipation.     [provider]  predniSONE (DELTASONE) 20 MG tablet Take 2 tablets (40 mg total) by mouth daily. Patient not taking: Reported on 05/30/2018 09/07/17   Blanchie Dessert, MD  pregabalin (LYRICA) 75 MG capsule Take 75 mg by mouth 3 (three) times daily.    [provider]  traMADol (ULTRAM) 50 MG tablet Take 50 mg by mouth every 6 (six) hours as needed (for pain).    [provider]  vitamin B-12 (CYANOCOBALAMIN) 1000 MCG tablet Take 1,000 mcg by mouth daily.    [provider]  Vitamin D, Ergocalciferol, (DRISDOL) 50000 units CAPS capsule Take 50,000 Units by mouth every Monday.  03/24/17   [provider]     Allergies:     Allergies  Allergen Reactions  . Codeine  Itching    Only happened one time and did not occur last time patient was in the hospital. (??)     Physical Exam:   Vitals  Blood pressure (!) 109/96, pulse (!) 56, temperature 98.5 F (36.9 C), temperature source Oral, resp. rate 20, weight (!) 163.3 kg, last menstrual period 07/11/2014, SpO2 100 %.  1.  General: axox3  2. Psychiatric: euthymic  3. Neurologic: cn2-12 intact, reflexes 2+ symmetric, diffuse, with downgoing toes bilaterally, unable to move legs per pt  4. HEENMT:  Anicteric, pupils 1.40mm symmetric, direct, consensual, near intact mmm  5. Respiratory : CTAB  6. Cardiovascular : rrr s1, s2, no m/g/r  7. Gastrointestinal:  Abd: soft, morbidly obese, nt, +bs  8. Skin:  Ext: no c/c/e, no rash  9.Musculoskeletal:  Good ROM  No adenopathy     Data Review:    CBC Recent Labs  Lab 06/17/18 2032  WBC 6.8  HGB 11.6*  HCT 35.6*  PLT 298  MCV 97.8  MCH 31.9  MCHC 32.6  RDW 11.8  LYMPHSABS 2.5  MONOABS 0.9  EOSABS 0.2  BASOSABS 0.0   ------------------------------------------------------------------------------------------------------------------  Results for orders placed or performed during the hospital encounter of 06/17/18 (from the past 48 hour(s))  Basic metabolic panel     Status: Abnormal   Collection Time: 06/17/18  8:32 PM  Result Value Ref Range   Sodium 141 135 - 145 mmol/L   Potassium 3.6 3.5 - 5.1 mmol/L   Chloride 107 98 - 111 mmol/L   CO2 29 22 - 32 mmol/L   Glucose, Bld 108 (H) 70 - 99 mg/dL   BUN 13 6 - 20 mg/dL   Creatinine, Ser 0.66 0.44 - 1.00 mg/dL   Calcium 12.0 (H) 8.9 - 10.3 mg/dL   GFR calc non Af Amer >60 >60 mL/min   GFR calc Af Amer >60 >60 mL/min   Anion gap 5 5 - 15    Comment: Performed at [facility], [place] [address] [place], [place] [address]  CBC with Differential  Status: Abnormal   Collection Time: 06/17/18  8:32 PM  Result Value Ref Range   WBC 6.8 4.0 - 10.5 K/uL   RBC 3.64 (L) 3.87  - 5.11 MIL/uL   Hemoglobin 11.6 (L) 12.0 - 15.0 g/dL   HCT 35.6 (L) 36.0 - 46.0 %   MCV 97.8 80.0 - 100.0 fL   MCH 31.9 26.0 - 34.0 pg   MCHC 32.6 30.0 - 36.0 g/dL   RDW 11.8 11.5 - 15.5 %   Platelets 298 150 - 400 K/uL   nRBC 0.0 0.0 - 0.2 %   Neutrophils Relative % 48 %   Neutro Abs 3.3 1.7 - 7.7 K/uL   Lymphocytes Relative 36 %   Lymphs Abs 2.5 0.7 - 4.0 K/uL   Monocytes Relative 13 %   Monocytes Absolute 0.9 0.1 - 1.0 K/uL   Eosinophils Relative 3 %   Eosinophils Absolute 0.2 0.0 - 0.5 K/uL   Basophils Relative 0 %   Basophils Absolute 0.0 0.0 - 0.1 K/uL   Immature Granulocytes 0 %   Abs Immature Granulocytes 0.01 0.00 - 0.07 K/uL    Comment: Performed at [facility], [address], [place], [place] [address]    Chemistries  Recent Labs  Lab 06/17/18 2032  NA 141  K 3.6  CL 107  CO2 29  GLUCOSE 108*  BUN 13  CREATININE 0.66  CALCIUM 12.0*   ------------------------------------------------------------------------------------------------------------------  ------------------------------------------------------------------------------------------------------------------ GFR: Estimated Creatinine Clearance: 117.3 mL/min (by C-G formula based on SCr of 0.66 mg/dL). Liver Function Tests: No results for input(s): AST, ALT, ALKPHOS, BILITOT, PROT, ALBUMIN in the last 168 hours. No results for input(s): LIPASE, AMYLASE in the last 168 hours. No results for input(s): AMMONIA in the last 168 hours. Coagulation Profile: No results for input(s): INR, PROTIME in the last 168 hours. Cardiac Enzymes: No results for input(s): CKTOTAL, CKMB, CKMBINDEX, TROPONINI in the last 168 hours. BNP (last 3 results) No results for input(s): PROBNP in the last 8760 hours. HbA1C: No results for input(s): HGBA1C in the last 72 hours. CBG: No results for input(s): GLUCAP in the last 168 hours. Lipid Profile: No results for input(s): CHOL, HDL, LDLCALC, TRIG, CHOLHDL, LDLDIRECT in  the last 72 hours. Thyroid Function Tests: No results for input(s): TSH, T4TOTAL, FREET4, T3FREE, THYROIDAB in the last 72 hours. Anemia Panel: No results for input(s): VITAMINB12, FOLATE, FERRITIN, TIBC, IRON, RETICCTPCT in the last 72 hours.  --------------------------------------------------------------------------------------------------------------- Urine analysis:    Component Value Date/Time   COLORURINE YELLOW 06/06/2017 1816   APPEARANCEUR CLEAR 06/06/2017 1816   LABSPEC 1.010 06/06/2017 1816   PHURINE 6.0 06/06/2017 1816   GLUCOSEU NEGATIVE 06/06/2017 1816   GLUCOSEU NEG mg/dL 08/05/2006 2022   HGBUR TRACE (A) 06/06/2017 1816   BILIRUBINUR NEGATIVE 06/06/2017 1816   KETONESUR NEGATIVE 06/06/2017 1816   PROTEINUR NEGATIVE 06/06/2017 1816   UROBILINOGEN 1.0 06/14/2011 1828   NITRITE NEGATIVE 06/06/2017 1816   LEUKOCYTESUR NEGATIVE 06/06/2017 1816      Imaging Results:    No results found. ekg pending    Assessment & Plan:    Principal Problem:   Back pain Active Problems:   Hyperlipidemia   Essential hypertension   OSA (obstructive sleep apnea)  Back pain Cont Lyrica Cont Percocet qhs Check INR, PTT Check urinalysis Check EKG Neurosurgery consulted by ED, recommended surgery Thursday  Hypercalcemia Check magnesium, phosphorus, PTH, PTHrp, TSH, Vitamin D 25-oh, vitamin D 1,25-oh, check myeloma panel Hydrate with ns iv Check cmp in am  Anemia Check cbc in am Check ferritin, iron, tibc, b12, folate  Hypertension Cont Amlodipine 5mg  po qday Cont Enalapril 20mg  po qday  Hyperlipidemia Cont Lipitor 20mg  po qday  Depression Cont Lexapro 10mg  po qday  OSA  Cpap    DVT Prophylaxis-   Heparin- SCDs  AM Labs Ordered, also please review Full Orders  Family Communication: Admission, patients condition and plan of care including tests being ordered have been discussed with the patient  who indicate understanding and agree with the plan and Code  Status.  Code Status:   FULL CODE  Admission status:   Inpatient: Based on patients clinical presentation and evaluation of above clinical data, I have made determination that patient meets Inpatient criteria at this time. Pt will require apparently surgery for her severe neural foraminal stenosis, as well as evaluation of hypercalcemia, tx with iv ns.   Time spent in minutes : 70   Jani Gravel M.D on 06/17/2018 at 10:49 PM

## 2018-06-17 NOTE — ED Provider Notes (Signed)
[facility] EMERGENCY DEPARTMENT Provider Note   CSN: [id number] Arrival date & time: 06/17/18  1823    History   Chief Complaint Chief Complaint  Patient presents with   Knee Pain   Back Pain    HPI Ellen [name] is a 58 y.o. female past with history of degenerative joint disease, hernia, hypertension, back pain who presents from [place] for evaluation of persistent back pain, bilateral lower extremity numbness/weakness, urinary and bowel incontinence.  Patient states that this is been an ongoing issue since March.  She has been seen for evaluation.  She had an admission at [facility] on 05/15/18 for evaluation of same symptoms.  At that time, she had MRIs done and neurosurgery was consulted who recommended no surgical intervention.  It was recommended for PT/OT.  She is currently moved into a SNF for PT/OT which she states she has been completing.  Patient states she comes in today because "all of her symptoms are getting worse."  She states that she has difficulty moving her legs secondary to weakness and also reports numbness from her pelvis down.  Additionally, she reports low back pain.  She states that all the symptoms are not new but she states that they are worse.  She also reports saddle anesthesia, urinary and bowel incontinence.  Patient states she is unable to walk secondary to her symptoms.  She not had any falls.  Patient denies any fevers, chest pain, difficulty breathing, abdominal pain, nausea/vomiting.    The history is provided by the patient.    Past Medical History:  Diagnosis Date   Bronchitis    hx of   Degenerative joint disease    Depression    ENDOMETRIAL POLYP 01/08/2006   Dr. Agnes Lawrence.  2001:  D&C, benign;  07/14/10:  D&C, hysteroscopy, polypectomy and bx of mons pubis (peau d'orange)...... 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   GERD (gastroesophageal reflux disease)      Hernia    High cholesterol    Hypertension    Lipoma    Obesity     Patient Active Problem List   Diagnosis Date Noted   Back pain 06/17/2018   OSA (obstructive sleep apnea) 06/17/2018   Anxiety 04/06/2017   Acute respiratory failure with hypoxia (HCC)    Acute hypoxemic respiratory failure ([place]) 04/05/2017   SIRS (systemic inflammatory response syndrome) ([place]) 04/05/2017   Elevated troponin 04/05/2017   Hyponatremia 04/05/2017   Urgency of micturation 04/14/2015   Dizziness 04/14/2015   Neck pain 03/31/2015   Abnormal uterine bleeding 08/29/2012   Incarcerated ventral hernia 09/11/2010   COUGH 05/13/2009   ALLERGIC RHINITIS, SEASONAL 05/26/2008   SHOULDER PAIN, LEFT 08/11/2007   PRURITUS, VAGINAL 09/30/2006   BACK PAIN 08/05/2006   Arthritis of both knees 02/15/2006   LIPOMA 01/08/2006   Hyperlipidemia 01/08/2006   Obesity, morbid, BMI 50 or higher ([place]) 01/08/2006   DEPRESSION 01/08/2006   Essential hypertension 01/08/2006   BRONCHITIS NOS 01/08/2006   GERD 01/08/2006   HERNIA, VENTRAL 01/08/2006   ENDOMETRIAL POLYP 01/08/2006   DEGENERATIVE JOINT DISEASE 01/08/2006    Past Surgical History:  Procedure Laterality Date   CHOLECYSTECTOMY     DILATION AND CURETTAGE OF UTERUS     resection of lipoma     UMBILICAL HERNIA REPAIR       OB History   No obstetric history on file.      Home Medications    Prior to  Admission medications   Medication Sig Start Date End Date Taking? Authorizing Provider  albuterol (PROVENTIL HFA;VENTOLIN HFA) 108 (90 Base) MCG/ACT inhaler Inhale 2 puffs into the lungs every 4 (four) hours as needed for wheezing or shortness of breath. Patient not taking: Reported on 05/30/2018 11/29/15   Duffy Bruce, MD  amLODipine (NORVASC) 5 MG tablet Take 5 mg by mouth daily.    [provider]  aspirin (BL ADULT ASPIRIN LOW STRENGTH) 81 MG chewable tablet Chew 81 mg by mouth daily.       [provider]  atorvastatin (LIPITOR) 20 MG tablet Take 20 mg by mouth daily at 6 PM.  03/28/17   [provider]  cyclobenzaprine (FLEXERIL) 10 MG tablet Take 1 tablet (10 mg total) by mouth 2 (two) times daily as needed for muscle spasms. Patient not taking: Reported on 05/30/2018 02/10/17   Long, Wonda Olds, MD  diclofenac sodium (VOLTAREN) 1 % GEL Apply 2-4 g topically every 2 (two) hours as needed (for bilateral knee pain).    [provider]  enalapril (VASOTEC) 20 MG tablet Take 20 mg by mouth daily.     [provider]  escitalopram (LEXAPRO) 10 MG tablet Take 10 mg by mouth daily.    [provider]  fluticasone (FLONASE) 50 MCG/ACT nasal spray Place 2 sprays into both nostrils daily as needed for allergies or rhinitis.  04/04/17   [provider]  guaiFENesin (MUCINEX) 600 MG 12 hr tablet Take 2 tablets (1,200 mg total) by mouth 2 (two) times daily. Patient taking differently: Take 1,200 mg by mouth 2 (two) times daily as needed for cough or to loosen phlegm.  04/08/17   Raiford Noble Latif, DO  ipratropium (ATROVENT) 0.02 % nebulizer solution Take 2.5 mLs (0.5 mg total) by nebulization every 6 (six) hours as needed for wheezing or shortness of breath. 04/08/17   Sheikh, Omair Latif, DO  levalbuterol (XOPENEX) 0.63 MG/3ML nebulizer solution Take 3 mLs (0.63 mg total) by nebulization every 6 (six) hours as needed for wheezing or shortness of breath. 04/08/17   Raiford Noble Latif, DO  medroxyPROGESTERone (PROVERA) 5 MG tablet Take 5 mg by mouth daily.    [provider]  methocarbamol (ROBAXIN) 500 MG tablet Take 1 tablet (500 mg total) by mouth every 8 (eight) hours as needed for muscle spasms. Patient not taking: Reported on 05/30/2018 02/20/18   Jola Schmidt, MD  omeprazole (PRILOSEC) 20 MG capsule Take 20 mg by mouth daily before breakfast.    [provider]  oxybutynin (DITROPAN-XL) 10 MG 24 hr tablet Take 10 mg by mouth  daily. 03/24/17   [provider]  oxyCODONE-acetaminophen (PERCOCET/ROXICET) 5-325 MG tablet Take 1 tablet by mouth at bedtime.     [provider]  polyethylene glycol powder (GLYCOLAX/MIRALAX) 17 GM/SCOOP powder Take 17 g by mouth daily as needed for mild constipation.     [provider]  predniSONE (DELTASONE) 20 MG tablet Take 2 tablets (40 mg total) by mouth daily. Patient not taking: Reported on 05/30/2018 09/07/17   Blanchie Dessert, MD  pregabalin (LYRICA) 75 MG capsule Take 75 mg by mouth 3 (three) times daily.    [provider]  traMADol (ULTRAM) 50 MG tablet Take 50 mg by mouth every 6 (six) hours as needed (for pain).    [provider]  vitamin B-12 (CYANOCOBALAMIN) 1000 MCG tablet Take 1,000 mcg by mouth daily.    [provider]  Vitamin D, Ergocalciferol, (DRISDOL) 50000  units CAPS capsule Take 50,000 Units by mouth every Monday.  03/24/17   [provider]    Family History Family History  Problem Relation Age of Onset   Heart disease Mother    Diabetes Mother    Cancer Father     Social History Social History   Tobacco Use   Smoking status: Never Smoker   Smokeless tobacco: Never Used  Substance Use Topics   Alcohol use: No    Alcohol/week: 0.0 standard drinks   Drug use: No     Allergies   Codeine   Review of Systems Review of Systems  Constitutional: Negative for fever.  Respiratory: Negative for cough and shortness of breath.   Cardiovascular: Negative for chest pain.  Gastrointestinal: Negative for abdominal pain, nausea and vomiting.  Musculoskeletal: Positive for back pain.  Neurological: Positive for weakness and numbness.  All other systems reviewed and are negative.    Physical Exam Updated Vital Signs BP (!) 109/96    Pulse (!) 56    Temp 98.5 F (36.9 C) (Oral)    Resp 20    Wt (!) 163.3 kg    LMP 07/11/2014    SpO2 100%    BMI 61.79 kg/m   Physical Exam Vitals signs  and nursing note reviewed.  Constitutional:      Appearance: Normal appearance. She is well-developed.  HENT:     Head: Normocephalic and atraumatic.  Eyes:     General: Lids are normal.     Conjunctiva/sclera: Conjunctivae normal.     Pupils: Pupils are equal, round, and reactive to light.  Neck:     Musculoskeletal: Full passive range of motion without pain.  Cardiovascular:     Rate and Rhythm: Normal rate and regular rhythm.     Pulses: Normal pulses.          Radial pulses are 2+ on the right side and 2+ on the left side.       Dorsalis pedis pulses are 2+ on the right side and 2+ on the left side.     Heart sounds: Normal heart sounds. No murmur. No friction rub. No gallop.   Pulmonary:     Effort: Pulmonary effort is normal.     Breath sounds: Normal breath sounds.     Comments: Lungs clear to auscultation bilaterally.  Symmetric chest rise.  No wheezing, rales, rhonchi. Abdominal:     Palpations: Abdomen is soft. Abdomen is not rigid.     Tenderness: There is no abdominal tenderness. There is no guarding.     Comments: Abdomen is soft, non-distended, non-tender. No rigidity, No guarding. No peritoneal signs.  Musculoskeletal: Normal range of motion.  Skin:    General: Skin is warm and dry.     Capillary Refill: Capillary refill takes less than 2 seconds.  Neurological:     Mental Status: She is alert and oriented to person, place, and time.     Comments: 5/5 strength BUE Patient unable to lift her bilateral lower extremities off bed.  She is unable able to perform dorsiflexion and plantarflexion though when I move her leg, she does have spontaneous movement of her right foot.  When I hold up her legs against gravity, they fall directly to the ground.  She reports decreased sensation noted to bilateral lower extremities.  Psychiatric:        Speech: Speech normal.      ED Treatments / Results  Labs (all labs ordered are listed, but only  abnormal results are  displayed) Labs Reviewed  BASIC METABOLIC PANEL - Abnormal; Notable for the following components:      Result Value   Glucose, Bld 108 (*)    Calcium 12.0 (*)    All other components within normal limits  CBC WITH DIFFERENTIAL/PLATELET - Abnormal; Notable for the following components:   RBC 3.64 (*)    Hemoglobin 11.6 (*)    HCT 35.6 (*)    All other components within normal limits    EKG None  Radiology No results found.  Procedures Procedures (including critical care time)  Medications Ordered in ED Medications - No data to display   Initial Impression / Assessment and Plan / ED Course  I have reviewed the triage vital signs and the nursing notes.  Pertinent labs & imaging results that were available during my care of the patient were reviewed by me and considered in my medical decision making (see chart for details).        58 year old female who presents for evaluation of worsening lower back pain, bilateral lower extremity numbness/weakness, urinary bowel incontinence and saddle anesthesia.  Patient arrives from [place] care via EMS for evaluation of symptoms.  She states is been ongoing for the last several months.  Was seen at [facility] admitted at that time.  Neurosurgery saw her and did not indicate any further surgical intervention.  She was discharged to SNF where she has been but comes in today because she states symptoms have worsened. Patient is afebrile, non-toxic appearing, sitting comfortably on examination table. Vital signs reviewed and stable.  She has full strength of her upper extremities but is unable to move or lift her lower extremities bilaterally up off of bed.  Additionally, she cannot plantarflex or dorsiflex.  Reports decreased sensation noted to bilateral lower extremities.  Will plan for labs, neurology consult.  MRI shows multifocal marrow edema in the C7-T2 vertebrae and/or ribs.  Evidence of interspinous ligament injury at  T1-T2.  Widespread degenerative cervical spinal stenosis and spinal cord mass-effect.  Questionable abnormal cord signal at C3-C4, C4-C5 and T1-T2.  Her T-spine MRI showed evidence of abnormal spinal cord signal at T1-T2 where there is chronic spinal stenosis and spinal cord mass-effect.  MRI lumbar spine showed multilevel lumbar spondylosis with moderate spinal canal and right lateral recess stenosis with slightly worsened moderate bilateral neuroforaminal stenosis.  She was started on Lyrica.  They initially recommended a SNF but patient refused.  She was however moved to a SNF about 10 days ago.  CBC unremarkable.  BMP is unremarkable.  Discussed patient with Dr. Leonel Ramsay (Neuro) who reviewed patients' imaging. Recommends neurosurgery consult.   Discussed patient with PA Costello (Neurosurgery). He will discuss with attending.   Discussed patient with PA Costello (Neurosurgery). He will come evaluate patient in the ED.   Discussed patient with PA Costello (Neurosurgery) after evaluation of the patient. Recommends medical admission with neurosurgery consult. Plan for possible laminectomy on Thursday.  Discussed patient with Dr. Maudie Mercury (hospitalist). Will admit patient.    Portions of this note were generated with [employer]. Dictation errors may occur despite best attempts at proofreading.   Final Clinical Impressions(s) / ED Diagnoses   Final diagnoses:  Chronic bilateral low back pain, unspecified whether sciatica present  Weakness of both lower extremities  Urinary incontinence, unspecified type    ED Discharge Orders    None       Volanda Napoleon, PA-C 06/17/18 [id number]  Malvin Johns, MD 06/17/18 [id number]

## 2018-06-17 NOTE — Consult Note (Addendum)
Chief Complaint   Chief Complaint  Patient presents with  . Knee Pain  . Back Pain    HPI   Consult requested by: PA Layden Reason for consult: multilevel spinal stenosis  HPI: Ellen [name] is a 58 y.o. female who presents with worsening lower back and bilateral lower extremity pain. I have reviewed her records on Care Everywhere and there is slight disconnect between history there and per patient. She reports symptoms stem back to Jan/Feb when she had a mechanical fall. After the fall she reports lower back pain, BLE pain and BLE numbness/tingling with associated gait instability. She describes gait instability as being wobbly on her feet and feeling off balance. She was still managing to perform ADLs, although difficult. Unfortunately she suffered another mechanical fall mid-March and reports since then, has been unable to move her BLE.  She reports being hospitalized in [place] but she states that she was advised there was nothing neurosurgical and was referred to PT/OT.  She had to "self admit" to a SNF due to the inability to care for self at home. She presented to the ER today for worsening LBP and BLE pain. She endorses numbness from essentially the belly button to her feet. Still unable to move BLE but again she reports this stems back approx 7 weeks. Endorses urinary incontinence since March as well.   Patient Active Problem List   Diagnosis Date Noted  . Anxiety 04/06/2017  . Acute respiratory failure with hypoxia ([place])   . Acute hypoxemic respiratory failure ([place]) 04/05/2017  . SIRS (systemic inflammatory response syndrome) ([place]) 04/05/2017  . Elevated troponin 04/05/2017  . Hyponatremia 04/05/2017  . Urgency of micturation 04/14/2015  . Dizziness 04/14/2015  . Neck pain 03/31/2015  . Abnormal uterine bleeding 08/29/2012  . Incarcerated ventral hernia 09/11/2010  . COUGH 05/13/2009  . ALLERGIC RHINITIS, SEASONAL 05/26/2008  . SHOULDER PAIN, LEFT 08/11/2007  .  PRURITUS, VAGINAL 09/30/2006  . BACK PAIN 08/05/2006  . 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 LIPOMA 01/08/2006  . DYSLIPIDEMIA 01/08/2006  . Obesity, morbid, BMI 50 or higher ([place]) 01/08/2006  . DEPRESSION 01/08/2006  . Essential hypertension 01/08/2006  . BRONCHITIS NOS 01/08/2006  . GERD 01/08/2006  . HERNIA, VENTRAL 01/08/2006  . ENDOMETRIAL POLYP 01/08/2006  . DEGENERATIVE JOINT DISEASE 01/08/2006    PMH: Past Medical History:  Diagnosis Date  . Bronchitis    hx of  . Degenerative joint disease   . Depression   . ENDOMETRIAL POLYP 01/08/2006   Dr. Agnes Lawrence.  2001:  D&C, benign;  07/14/10:  D&C, hysteroscopy, polypectomy and bx of mons pubis (peau d'orange)...... 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  Marland Kitchen GERD (gastroesophageal reflux disease)   . Hernia   . High cholesterol   . Hypertension   . Lipoma   . Obesity     PSH: Past Surgical History:  Procedure Laterality Date  . CHOLECYSTECTOMY    . DILATION AND CURETTAGE OF UTERUS    . resection of lipoma    . UMBILICAL HERNIA REPAIR      (Not in a hospital admission)   SH: Social History   Tobacco Use  . Smoking status: Never Smoker  . Smokeless tobacco: Never Used  Substance Use Topics  . Alcohol use: No    Alcohol/week: 0.0 standard drinks  . Drug use: No    MEDS: Prior to Admission medications   Medication Sig Start Date End Date Taking? Authorizing Provider  albuterol (  PROVENTIL HFA;VENTOLIN HFA) 108 (90 Base) MCG/ACT inhaler Inhale 2 puffs into the lungs every 4 (four) hours as needed for wheezing or shortness of breath. Patient not taking: Reported on 05/30/2018 11/29/15   Duffy Bruce, MD  amLODipine (NORVASC) 5 MG tablet Take 5 mg by mouth daily.    [provider]  aspirin (BL ADULT ASPIRIN LOW STRENGTH) 81 MG chewable tablet Chew 81 mg by mouth daily.      [provider]  atorvastatin (LIPITOR) 20 MG tablet Take 20 mg by mouth daily at 6  PM.  03/28/17   [provider]  cyclobenzaprine (FLEXERIL) 10 MG tablet Take 1 tablet (10 mg total) by mouth 2 (two) times daily as needed for muscle spasms. Patient not taking: Reported on 05/30/2018 02/10/17   Long, Wonda Olds, MD  diclofenac sodium (VOLTAREN) 1 % GEL Apply 2-4 g topically every 2 (two) hours as needed (for bilateral knee pain).    [provider]  enalapril (VASOTEC) 20 MG tablet Take 20 mg by mouth daily.     [provider]  escitalopram (LEXAPRO) 10 MG tablet Take 10 mg by mouth daily.    [provider]  fluticasone (FLONASE) 50 MCG/ACT nasal spray Place 2 sprays into both nostrils daily as needed for allergies or rhinitis.  04/04/17   [provider]  guaiFENesin (MUCINEX) 600 MG 12 hr tablet Take 2 tablets (1,200 mg total) by mouth 2 (two) times daily. Patient taking differently: Take 1,200 mg by mouth 2 (two) times daily as needed for cough or to loosen phlegm.  04/08/17   Raiford Noble Latif, DO  ipratropium (ATROVENT) 0.02 % nebulizer solution Take 2.5 mLs (0.5 mg total) by nebulization every 6 (six) hours as needed for wheezing or shortness of breath. 04/08/17   Sheikh, Omair Latif, DO  levalbuterol (XOPENEX) 0.63 MG/3ML nebulizer solution Take 3 mLs (0.63 mg total) by nebulization every 6 (six) hours as needed for wheezing or shortness of breath. 04/08/17   Raiford Noble Latif, DO  medroxyPROGESTERone (PROVERA) 5 MG tablet Take 5 mg by mouth daily.    [provider]  methocarbamol (ROBAXIN) 500 MG tablet Take 1 tablet (500 mg total) by mouth every 8 (eight) hours as needed for muscle spasms. Patient not taking: Reported on 05/30/2018 02/20/18   Jola Schmidt, MD  omeprazole (PRILOSEC) 20 MG capsule Take 20 mg by mouth daily before breakfast.    [provider]  oxybutynin (DITROPAN-XL) 10 MG 24 hr tablet Take 10 mg by mouth daily. 03/24/17   [provider]  oxyCODONE-acetaminophen (PERCOCET/ROXICET) 5-325 MG  tablet Take 1 tablet by mouth at bedtime.     [provider]  polyethylene glycol powder (GLYCOLAX/MIRALAX) 17 GM/SCOOP powder Take 17 g by mouth daily as needed for mild constipation.     [provider]  predniSONE (DELTASONE) 20 MG tablet Take 2 tablets (40 mg total) by mouth daily. Patient not taking: Reported on 05/30/2018 09/07/17   Blanchie Dessert, MD  pregabalin (LYRICA) 75 MG capsule Take 75 mg by mouth 3 (three) times daily.    [provider]  traMADol (ULTRAM) 50 MG tablet Take 50 mg by mouth every 6 (six) hours as needed (for pain).    [provider]  vitamin B-12 (CYANOCOBALAMIN) 1000 MCG tablet Take 1,000 mcg by mouth daily.    [provider]  Vitamin D, Ergocalciferol, (DRISDOL) 50000 units CAPS capsule Take 50,000 Units by mouth every Monday.  03/24/17   [provider]    ALLERGY: Allergies  Allergen Reactions  . Codeine Itching    Only happened one time and did not occur last time patient was in the hospital. (??)    Social History   Tobacco Use  . Smoking status: Never Smoker  . Smokeless tobacco: Never Used  Substance Use Topics  . Alcohol use: No    Alcohol/week: 0.0 standard drinks     Family History  Problem Relation Age of Onset  . Heart disease Mother   . Diabetes Mother   . Cancer Father      ROS   Review of Systems  Constitutional: Negative.   HENT: Negative.   Eyes: Negative.   Respiratory: Negative.   Cardiovascular: Negative.   Gastrointestinal: Negative.   Genitourinary: Negative.   Musculoskeletal: Positive for back pain, falls, joint pain and myalgias. Negative for neck pain.  Skin: Negative.   Neurological: Positive for tingling, sensory change (BLE) and focal weakness (BLE). Negative for dizziness, tremors, speech change, seizures, loss of consciousness and headaches.    Exam   Vitals:   06/17/18 1900 06/17/18 1934  BP: 135/77 129/77  Pulse: 70 71  Resp:  20  Temp:  98.5  F (36.9 C)  SpO2: 100% 100%   General appearance: morbidly obese, resting comfortably Eyes: No scleral injection Cardiovascular: Regular rate and rhythm without murmurs, rubs, gallops. No edema or variciosities. Distal pulses normal. Pulmonary: Effort normal, non-labored breathing Musculoskeletal:     Muscle tone upper extremities: Normal    Muscle tone lower extremities: Normal    Motor exam: Upper Extremities Deltoid Bicep Tricep Grip  Right 5/5 5/5 5/5 5/5  Left 5/5 5/5 5/5 5/5   Lower Extremity IP Quad PF DF EHL  Right 0/5 0/5 0/5 0/5 0/5  Left 0/5 0/5 0/5 0/5 0/5   Neurological Mental Status:    - Patient is awake, alert, oriented to person, place, month, year, and situation    - Patient is able to give a clear and coherent history.    - No signs of aphasia or neglect Cranial Nerves    - II: Visual Fields are full. PERRL    - III/IV/VI: EOMI without ptosis or diploplia.     - V: Facial sensation is grossly normal    - VII: Facial movement is symmetric.     - VIII: hearing is intact to voice    - X: Uvula elevates symmetrically    - XI: Shoulder shrug is symmetric.    - XII: tongue is midline without atrophy or fasciculations.  Sensory: reports decreased sensation from groin to feet, although subjective as she is able to discern pain with the exception of b/l lateral thighs. Deep Tendon Reflexes    - 2+ and symmetric in the biceps. Unable to isolate patellar or achilles due to body habitus  Results - Imaging/Labs   Results for orders placed or performed during the hospital encounter of 06/17/18 (from the past 48 hour(s))  Basic metabolic panel     Status: Abnormal   Collection Time: 06/17/18  8:32 PM  Result Value Ref Range   Sodium 141 135 - 145 mmol/L   Potassium 3.6 3.5 - 5.1 mmol/L   Chloride 107 98 - 111 mmol/L   CO2 29 22 - 32 mmol/L   Glucose, Bld 108 (H) 70 - 99 mg/dL   BUN 13 6 - 20 mg/dL   Creatinine, Ser 0.66 0.44 - 1.00 mg/dL   Calcium 12.0 (H) 8.9  -  10.3 mg/dL   GFR calc non Af Amer >60 >60 mL/min   GFR calc Af Amer >60 >60 mL/min   Anion gap 5 5 - 15    Comment: Performed at [facility] [address], [place], [place] [address]  CBC with Differential     Status: Abnormal   Collection Time: 06/17/18  8:32 PM  Result Value Ref Range   WBC 6.8 4.0 - 10.5 K/uL   RBC 3.64 (L) 3.87 - 5.11 MIL/uL   Hemoglobin 11.6 (L) 12.0 - 15.0 g/dL   HCT 35.6 (L) 36.0 - 46.0 %   MCV 97.8 80.0 - 100.0 fL   MCH 31.9 26.0 - 34.0 pg   MCHC 32.6 30.0 - 36.0 g/dL   RDW 11.8 11.5 - 15.5 %   Platelets 298 150 - 400 K/uL   nRBC 0.0 0.0 - 0.2 %   Neutrophils Relative % 48 %   Neutro Abs 3.3 1.7 - 7.7 K/uL   Lymphocytes Relative 36 %   Lymphs Abs 2.5 0.7 - 4.0 K/uL   Monocytes Relative 13 %   Monocytes Absolute 0.9 0.1 - 1.0 K/uL   Eosinophils Relative 3 %   Eosinophils Absolute 0.2 0.0 - 0.5 K/uL   Basophils Relative 0 %   Basophils Absolute 0.0 0.0 - 0.1 K/uL   Immature Granulocytes 0 %   Abs Immature Granulocytes 0.01 0.00 - 0.07 K/uL    Comment: Performed at [facility] [address], [place], [place] [address]    No results found.  IMAGING: MRI C spine, T spine and L spine dated 05/16/2018 were reviewed on PACS. C spine: multilevel cervicalspondylolysis with at most mild spinal stenosis C3-C7. No associated cord signal change. Multilevel foraminal disease. T spine: multilevel thoracic spinal stenosis. Moderate at T1-2 with cord signal changes. Mild T7-8 spinal stenosis. Moderate-Severe spinal stenosis at T10-11. L spine: multilevel spondylosis with moderate spinal canal stenosis at L3-4 and L4-5. Multilevel foraminal disease.  Impression/Plan   58 y.o. female with LBP/BLE pain, bilateral lower extremity flaccidity and ?decreased sensation in BLE after a fall roughly 7 weeks ago. Her exam is consistent with T10-T11 pathology which on MRI shows mod-severe spinal stenosis at this level. She will need to undergo thoracic  laminectomy for decompression. With her deficits being approx 58 weeks old, surgery may not actually provide any improvement in her motor function, but it certainly gives her the best chance for recovery.  Surgery tentatively planned for Thursday. Risks/benefits/alternatives to the procedure were discussed. Patient states in own language understanding. Req medical admission for medical optimization.  Dr Kathyrn Sheriff to follow up tomorrow to discuss surgery further.  Ferne Reus, PA-C [facility] [employer]

## 2018-06-17 NOTE — ED Triage Notes (Signed)
Pt in from [facility] via [facility] with c/o bilateral knee and back pain. Had a fall 1 mo ago, injured back and back surgery was pushed back d/t Covid - has been in [facility] x 10 days. Reports increased pain and decr sensation/movement in toes since today

## 2018-06-18 ENCOUNTER — Encounter (HOSPITAL_COMMUNITY): Payer: Self-pay | Admitting: General Practice

## 2018-06-18 ENCOUNTER — Other Ambulatory Visit: Payer: Self-pay

## 2018-06-18 LAB — COMPREHENSIVE METABOLIC PANEL
ALT: 12 U/L (ref 0–44)
AST: 13 U/L — ABNORMAL LOW (ref 15–41)
Albumin: 2.8 g/dL — ABNORMAL LOW (ref 3.5–5.0)
Alkaline Phosphatase: 95 U/L (ref 38–126)
Anion gap: 5 (ref 5–15)
BUN: 12 mg/dL (ref 6–20)
CO2: 29 mmol/L (ref 22–32)
Calcium: 11.9 mg/dL — ABNORMAL HIGH (ref 8.9–10.3)
Chloride: 108 mmol/L (ref 98–111)
Creatinine, Ser: 0.59 mg/dL (ref 0.44–1.00)
GFR calc Af Amer: >60 mL/min (ref >60)
GFR calc non Af Amer: >60 mL/min (ref >60)
Glucose, Bld: 107 mg/dL — ABNORMAL HIGH (ref 70–99)
Potassium: 3.6 mmol/L (ref 3.5–5.1)
Sodium: 142 mmol/L (ref 135–145)
Total Bilirubin: 0.6 mg/dL (ref 0.3–1.2)
Total Protein: 5.9 g/dL — ABNORMAL LOW (ref 6.5–8.1)

## 2018-06-18 LAB — URINALYSIS, ROUTINE W REFLEX MICROSCOPIC
Bilirubin Urine: NEGATIVE
Glucose, UA: NEGATIVE mg/dL
Hgb urine dipstick: NEGATIVE
Ketones, ur: NEGATIVE mg/dL
Leukocytes,Ua: NEGATIVE
Nitrite: NEGATIVE
Protein, ur: NEGATIVE mg/dL
Specific Gravity, Urine: 1.016 (ref 1.005–1.030)
pH: 6 (ref 5.0–8.0)

## 2018-06-18 LAB — CBC
HCT: 34.5 % — ABNORMAL LOW (ref 36.0–46.0)
Hemoglobin: 11.3 g/dL — ABNORMAL LOW (ref 12.0–15.0)
MCH: 31.9 pg (ref 26.0–34.0)
MCHC: 32.8 g/dL (ref 30.0–36.0)
MCV: 97.5 fL (ref 80.0–100.0)
Platelets: 284 10*3/uL (ref 150–400)
RBC: 3.54 MIL/uL — ABNORMAL LOW (ref 3.87–5.11)
RDW: 11.8 % (ref 11.5–15.5)
WBC: 6.5 10*3/uL (ref 4.0–10.5)
nRBC: 0 % (ref 0.0–0.2)

## 2018-06-18 LAB — IRON AND TIBC
Iron: 60 ug/dL (ref 28–170)
Saturation Ratios: 20 % (ref 10.4–31.8)
TIBC: 295 ug/dL (ref 250–450)
UIBC: 235 ug/dL

## 2018-06-18 LAB — MRSA PCR SCREENING: MRSA by PCR: NEGATIVE

## 2018-06-18 LAB — PROTIME-INR
INR: 1.2 (ref 0.8–1.2)
Prothrombin Time: 15.1 seconds (ref 11.4–15.2)

## 2018-06-18 LAB — APTT: aPTT: 30 seconds (ref 24–36)

## 2018-06-18 LAB — MAGNESIUM: Magnesium: 1.6 mg/dL — ABNORMAL LOW (ref 1.7–2.4)

## 2018-06-18 LAB — FERRITIN: Ferritin: 32 ng/mL (ref 11–307)

## 2018-06-18 LAB — VITAMIN B12: Vitamin B-12: 391 pg/mL (ref 180–914)

## 2018-06-18 LAB — HIV ANTIBODY (ROUTINE TESTING W REFLEX): HIV Screen 4th Generation wRfx: NONREACTIVE

## 2018-06-18 LAB — PHOSPHORUS: Phosphorus: 2.8 mg/dL (ref 2.5–4.6)

## 2018-06-18 MED ORDER — POLYETHYLENE GLYCOL 3350 17 G PO PACK
17.0000 g | PACK | Freq: Every day | ORAL | Status: DC | PRN
  Administered 2018-06-18: 17 g via ORAL
  Filled 2018-06-18: qty 1

## 2018-06-18 MED ORDER — OXYBUTYNIN CHLORIDE ER 10 MG PO TB24
10.0000 mg | ORAL_TABLET | Freq: Every day | ORAL | Status: DC
  Administered 2018-06-18: 10:00:00 10 mg via ORAL
  Filled 2018-06-18: qty 1

## 2018-06-18 MED ORDER — ESCITALOPRAM OXALATE 10 MG PO TABS
10.0000 mg | ORAL_TABLET | Freq: Every day | ORAL | Status: DC
  Administered 2018-06-18 – 2018-06-27 (×9): 10 mg via ORAL
  Filled 2018-06-18 (×9): qty 1

## 2018-06-18 MED ORDER — SODIUM CHLORIDE 0.9% FLUSH
3.0000 mL | Freq: Two times a day (BID) | INTRAVENOUS | Status: DC
  Administered 2018-06-18 – 2018-06-23 (×6): 3 mL via INTRAVENOUS

## 2018-06-18 MED ORDER — VITAMIN D (ERGOCALCIFEROL) 1.25 MG (50000 UNIT) PO CAPS
50000.0000 [IU] | ORAL_CAPSULE | ORAL | Status: DC
  Administered 2018-06-23: 50000 [IU] via ORAL
  Filled 2018-06-18: qty 1

## 2018-06-18 MED ORDER — TRAMADOL HCL 50 MG PO TABS
50.0000 mg | ORAL_TABLET | Freq: Four times a day (QID) | ORAL | Status: DC | PRN
  Administered 2018-06-18 – 2018-06-27 (×7): 50 mg via ORAL
  Filled 2018-06-18 (×7): qty 1

## 2018-06-18 MED ORDER — PANTOPRAZOLE SODIUM 40 MG PO TBEC
40.0000 mg | DELAYED_RELEASE_TABLET | Freq: Every day | ORAL | Status: DC
  Administered 2018-06-18 – 2018-06-27 (×9): 40 mg via ORAL
  Filled 2018-06-18 (×9): qty 1

## 2018-06-18 MED ORDER — MAGNESIUM SULFATE 2 GM/50ML IV SOLN
2.0000 g | Freq: Once | INTRAVENOUS | Status: AC
  Administered 2018-06-18: 2 g via INTRAVENOUS
  Filled 2018-06-18: qty 50

## 2018-06-18 MED ORDER — ASPIRIN 81 MG PO CHEW
81.0000 mg | CHEWABLE_TABLET | Freq: Every day | ORAL | Status: DC
  Administered 2018-06-18: 81 mg via ORAL
  Filled 2018-06-18: qty 1

## 2018-06-18 MED ORDER — VITAMIN B-12 1000 MCG PO TABS
1000.0000 ug | ORAL_TABLET | Freq: Every day | ORAL | Status: DC
  Administered 2018-06-18 – 2018-06-27 (×9): 1000 ug via ORAL
  Filled 2018-06-18 (×9): qty 1

## 2018-06-18 MED ORDER — HEPARIN SODIUM (PORCINE) 5000 UNIT/ML IJ SOLN
5000.0000 [IU] | Freq: Three times a day (TID) | INTRAMUSCULAR | Status: DC
  Administered 2018-06-18 (×3): 5000 [IU] via SUBCUTANEOUS
  Filled 2018-06-18 (×3): qty 1

## 2018-06-18 MED ORDER — CHLORHEXIDINE GLUCONATE CLOTH 2 % EX PADS: 6.0000 | MEDICATED_PAD | Freq: Every day | CUTANEOUS | Status: DC

## 2018-06-18 MED ORDER — DICLOFENAC SODIUM 1 % TD GEL
2.0000 g | Freq: Four times a day (QID) | TRANSDERMAL | Status: DC | PRN
  Administered 2018-06-18: 4 g via TOPICAL
  Filled 2018-06-18: qty 100

## 2018-06-18 MED ORDER — MUPIROCIN 2 % EX OINT
1.0000 "application " | TOPICAL_OINTMENT | Freq: Two times a day (BID) | CUTANEOUS | Status: DC
  Filled 2018-06-18: qty 22

## 2018-06-18 MED ORDER — SODIUM CHLORIDE 0.9 % IV SOLN: 250.0000 mL | INTRAVENOUS | Status: DC | PRN

## 2018-06-18 MED ORDER — ENALAPRIL MALEATE 20 MG PO TABS
20.0000 mg | ORAL_TABLET | Freq: Every day | ORAL | Status: DC
  Administered 2018-06-18 – 2018-06-27 (×9): 20 mg via ORAL
  Filled 2018-06-18 (×10): qty 1

## 2018-06-18 MED ORDER — OXYCODONE-ACETAMINOPHEN 5-325 MG PO TABS
1.0000 | ORAL_TABLET | Freq: Every day | ORAL | Status: DC
  Administered 2018-06-18 – 2018-06-26 (×10): 1 via ORAL
  Filled 2018-06-18 (×10): qty 1

## 2018-06-18 MED ORDER — ATORVASTATIN CALCIUM 10 MG PO TABS
20.0000 mg | ORAL_TABLET | Freq: Every day | ORAL | Status: DC
  Administered 2018-06-18 – 2018-06-27 (×10): 20 mg via ORAL
  Filled 2018-06-18 (×12): qty 2

## 2018-06-18 MED ORDER — PREGABALIN 75 MG PO CAPS
75.0000 mg | ORAL_CAPSULE | Freq: Three times a day (TID) | ORAL | Status: DC
  Administered 2018-06-18: 10:00:00 75 mg via ORAL
  Filled 2018-06-18: qty 1

## 2018-06-18 MED ORDER — PREGABALIN 75 MG PO CAPS
75.0000 mg | ORAL_CAPSULE | Freq: Two times a day (BID) | ORAL | Status: DC
  Administered 2018-06-18 – 2018-06-27 (×17): 75 mg via ORAL
  Filled 2018-06-18 (×17): qty 1

## 2018-06-18 MED ORDER — ACETAMINOPHEN 325 MG PO TABS: 650.0000 mg | ORAL_TABLET | Freq: Four times a day (QID) | ORAL | Status: DC | PRN

## 2018-06-18 MED ORDER — WHITE PETROLATUM EX OINT
TOPICAL_OINTMENT | CUTANEOUS | Status: AC
  Administered 2018-06-18: 20:00:00
  Filled 2018-06-18: qty 28.35

## 2018-06-18 MED ORDER — AMLODIPINE BESYLATE 5 MG PO TABS
5.0000 mg | ORAL_TABLET | Freq: Every day | ORAL | Status: DC
  Administered 2018-06-18 – 2018-06-26 (×7): 5 mg via ORAL
  Filled 2018-06-18 (×8): qty 1

## 2018-06-18 MED ORDER — ACETAMINOPHEN 650 MG RE SUPP: 650.0000 mg | Freq: Four times a day (QID) | RECTAL | Status: DC | PRN

## 2018-06-18 MED ORDER — SODIUM CHLORIDE 0.9% FLUSH: 3.0000 mL | INTRAVENOUS | Status: DC | PRN

## 2018-06-18 NOTE — Anesthesia Preprocedure Evaluation (Addendum)
Anesthesia Evaluation  Patient identified by MRN, date of birth, ID band Patient awake    Reviewed: Allergy & Precautions, NPO status , Patient's Chart, lab work & pertinent test results  Airway Mallampati: II  TM Distance: >3 FB Neck ROM: Full    Dental no notable dental hx.    Pulmonary sleep apnea and Continuous Positive Airway Pressure Ventilation ,    Pulmonary exam normal breath sounds clear to auscultation       Cardiovascular hypertension, Pt. on medications Normal cardiovascular exam Rhythm:Regular Rate:Normal  ECG: SR, rate 93   Neuro/Psych PSYCHIATRIC DISORDERS Anxiety Depression negative neurological ROS     GI/Hepatic Neg liver ROS, GERD  Medicated and Controlled,  Endo/Other  Morbid obesity (Super)  Renal/GU negative Renal ROS     Musculoskeletal negative musculoskeletal ROS (+)   Abdominal (+) + obese,   Peds  Hematology  (+) anemia , HLD   Anesthesia Other Findings HNP  Reproductive/Obstetrics                            Anesthesia Physical Anesthesia Plan  ASA: IV  Anesthesia Plan: General   Post-op Pain Management:    Induction: Intravenous  PONV Risk Score and Plan: 4 or greater and Midazolam, Dexamethasone, Ondansetron and Treatment may vary due to age or medical condition  Airway Management Planned: Oral ETT  Additional Equipment:   Intra-op Plan:   Post-operative Plan: Possible Post-op intubation/ventilation and Extubation in OR  Informed Consent: I have reviewed the patients History and Physical, chart, labs and discussed the procedure including the risks, benefits and alternatives for the proposed anesthesia with the patient or authorized representative who has indicated his/her understanding and acceptance.     Dental advisory given  Plan Discussed with: CRNA  Anesthesia Plan Comments:        Anesthesia Quick Evaluation

## 2018-06-18 NOTE — Progress Notes (Signed)
PROGRESS NOTE    Ellen [name]  UUV:[id number] DOB: 07-May-1960 DOA: 06/17/2018 PCP: Deborah Chalk, FNP  Brief Narrative: 58 year old morbidly obese female with history of OSA on CPAP, hypertension, dyslipidemia, started having low back pain, tingling numbness in both lower extremities and gait disorder in January/February following a mechanical fall after this she was still able to perform some ADLs with difficulty, unfortunately suffered another fall in mid March and since then has been unable to move both lower extremities she was admitted to [facility] hospital and was subsequently referred for physical therapy. -Presented to the ED yesterday due to worsening low back pain and weakness endorses numbness from umbilicus down. -History of urinary incontinence -Neurosurgery consulted due to paraplegia at T8 11-T12 level with diffuse multilevel stenosis, neurosurgery consulted, plan for surgical decompression/laminectomy given chronicity of her symptoms unlikely she will regain significant strength however any recovery is impossible without surgery  Assessment & Plan:   Severe multilevel spinal stenosis with paraplegia  -Has almost complete loss of strength and sensations in both lower extremity, bladder incontinence -We appreciate neurosurgical input -And for surgical decompression tomorrow -Due to chronicity of symptoms less chances of significant improvement -Continue Lyrica, tramadol -Caution with oversedation  Hypercalcemia -Work-up ongoing, follow-up SPEP PTH, PTH RP and vitamin D level -Also needs to be corrected for low albumin  Hypomagnesemia -Replace  Anemia -Follow-up anemia panel, monitor  Hypertension -Continue amlodipine and enalapril  Depression -Continue Lexapro  OSA -Continue CPAP nightly  Morbid obesity -BMI of 63 -Advised lifestyle modification, caution with oversedation  DVT prophylaxis: Heparin subcutaneous Code Status: Full code  family  Communication: Family at bedside will update Disposition Plan: May likely need rehab  Consultants:   Neurosurgery   Procedures:   Antimicrobials:    Subjective: -Continues to have low back pain  Objective: Vitals:   06/18/18 0002 06/18/18 0346 06/18/18 0820 06/18/18 1130  BP: 118/80 132/76 (!) 114/59 123/60  Pulse: (!) 57 72 80 60  Resp: 20 20 16    Temp: 98.5 F (36.9 C) 98.4 F (36.9 C) 98.2 F (36.8 C) 98.3 F (36.8 C)  TempSrc: Oral Oral Oral Oral  SpO2: 100% 97% 99% 100%  Weight: (!) 167.8 kg     Height: 5\' 4"  (1.626 m)       Intake/Output Summary (Last 24 hours) at 06/18/2018 1235 Last data filed at 06/18/2018 1013 Gross per 24 hour  Intake 303.72 ml  Output 700 ml  Net -396.28 ml   Filed Weights   06/17/18 1838 06/18/18 0002  Weight: (!) 163.3 kg (!) 167.8 kg    Examination:  General exam: Morbidly obese chronically ill-appearing female Respiratory system: Distant breath sounds clear to auscultation. Cardiovascular system: S1 & S2 heard, RRR Gastrointestinal system: Abdomen is nondistended, soft and nontender.Normal bowel sounds heard. Central nervous system: Alert and oriented.  Paraplegia, loss of strength and sensations from umbilicus down Extremities: No edema Skin: No rashes, lesions or ulcers Psychiatry: Mood & affect appropriate.     Data Reviewed:   CBC: Recent Labs  Lab 06/17/18 2032 06/18/18 0444  WBC 6.8 6.5  NEUTROABS 3.3  --   HGB 11.6* 11.3*  HCT 35.6* 34.5*  MCV 97.8 97.5  PLT 298 474   Basic Metabolic Panel: Recent Labs  Lab 06/17/18 2032 06/18/18 0444  NA 141 142  K 3.6 3.6  CL 107 108  CO2 29 29  GLUCOSE 108* 107*  BUN 13 12  CREATININE 0.66 0.59  CALCIUM 12.0* 11.9*  MG  --  1.6*  PHOS  --  2.8   GFR: Estimated Creatinine Clearance: 119.4 mL/min (by C-G formula based on SCr of 0.59 mg/dL). Liver Function Tests: Recent Labs  Lab 06/18/18 0444  AST 13*  ALT 12  ALKPHOS 95  BILITOT 0.6  PROT 5.9*   ALBUMIN 2.8*   No results for input(s): LIPASE, AMYLASE in the last 168 hours. No results for input(s): AMMONIA in the last 168 hours. Coagulation Profile: Recent Labs  Lab 06/18/18 0444  INR 1.2   Cardiac Enzymes: No results for input(s): CKTOTAL, CKMB, CKMBINDEX, TROPONINI in the last 168 hours. BNP (last 3 results) No results for input(s): PROBNP in the last 8760 hours. HbA1C: No results for input(s): HGBA1C in the last 72 hours. CBG: No results for input(s): GLUCAP in the last 168 hours. Lipid Profile: No results for input(s): CHOL, HDL, LDLCALC, TRIG, CHOLHDL, LDLDIRECT in the last 72 hours. Thyroid Function Tests: No results for input(s): TSH, T4TOTAL, FREET4, T3FREE, THYROIDAB in the last 72 hours. Anemia Panel: Recent Labs    06/18/18 0444  VITAMINB12 391  FERRITIN 32  TIBC 295  IRON 60   Urine analysis:    Component Value Date/Time   COLORURINE YELLOW 06/18/2018 0524   APPEARANCEUR HAZY (A) 06/18/2018 0524   LABSPEC 1.016 06/18/2018 0524   PHURINE 6.0 06/18/2018 0524   GLUCOSEU NEGATIVE 06/18/2018 0524   GLUCOSEU NEG mg/dL 08/05/2006 2022   HGBUR NEGATIVE 06/18/2018 0524   BILIRUBINUR NEGATIVE 06/18/2018 0524   KETONESUR NEGATIVE 06/18/2018 0524   PROTEINUR NEGATIVE 06/18/2018 0524   UROBILINOGEN 1.0 06/14/2011 1828   NITRITE NEGATIVE 06/18/2018 0524   LEUKOCYTESUR NEGATIVE 06/18/2018 0524   Sepsis Labs: @LABRCNTIP (procalcitonin:4,lacticidven:4)  ) Recent Results (from the past 240 hour(s))  MRSA PCR Screening     Status: None   Collection Time: 06/18/18  1:08 AM  Result Value Ref Range Status   MRSA by PCR NEGATIVE NEGATIVE Final    Comment:        The GeneXpert MRSA Assay (FDA approved for NASAL specimens only), is one component of a comprehensive MRSA colonization surveillance program. It is not intended to diagnose MRSA infection nor to guide or monitor treatment for MRSA infections. Performed at [facility], [place] [address], [place], [place] [address]          Radiology Studies: No results found.      Scheduled Meds: . amLODipine  5 mg Oral Daily  . aspirin  81 mg Oral Daily  . atorvastatin  20 mg Oral q1800  . enalapril  20 mg Oral Daily  . escitalopram  10 mg Oral Daily  . heparin  5,000 Units Subcutaneous Q8H  . oxybutynin  10 mg Oral Daily  . oxyCODONE-acetaminophen  1 tablet Oral QHS  . pantoprazole  40 mg Oral Daily  . pregabalin  75 mg Oral TID  . sodium chloride flush  3 mL Intravenous Q12H  . vitamin B-12  1,000 mcg Oral Daily  . [START ON 06/23/2018] Vitamin D (Ergocalciferol)  50,000 Units Oral Q Mon   Continuous Infusions: . sodium chloride       LOS: 1 day    Time spent: 62min    Domenic Polite, MD [facility]  06/18/2018, 12:35 PM

## 2018-06-18 NOTE — Progress Notes (Signed)
  NEUROSURGERY PROGRESS NOTE   No issues overnight. History reviewed. Pt with worsening back and leg pain, unable to move legs/walk for at least about seven weeks. Also complains of numbness from groin-level down.  Apparently was treated initially at [facility] non-operatively.  Sig PHMx includes Obestiy, OSA, HTN  EXAM:  BP (!) 114/59 (BP Location: Right Wrist)   Pulse 80   Temp 98.2 F (36.8 C) (Oral)   Resp 16   Ht 5\' 4"  (1.626 m)   Wt (!) 167.8 kg   LMP 07/11/2014   SpO2 99%   BMI 63.50 kg/m   Awake, alert, oriented  Speech fluent, appropriate  CN grossly intact  5/5 BUE No voluntary strength BLE Decreased sensation to LT ~T11-12 level down  IMAGING: MRI reviewed which demonstrates diffuse spinal spondylotic disease. Of note, there is severe stenosis at T10-11 due to ligamentous hypertrophy and facet arthropathy with subtle signal change within the cord at this level. There is also Signal change within the cord at ~T1-2 level without associated severe stenosis.  IMPRESSION:  58 y.o. female with paraplegia and sensory level localizing to ~T11-12. While imaging demonstrates diffuse multilevel stenosis throughout the C/T/L spine, her exam localizes to the T10-11 level.   PLAN: - Will proceed with surgical decompression via T10-11 laminectomy, likely tomorrow - NPO p MN  I have reviewed the situation with the patient.  I did review the imaging findings with her, and told her that while she does have diffuse disease in her spine, based on her symptoms it appears that the symptomatic level is the lower thoracic stenosis.  I reviewed with her treatment options including my recommendation for surgical decompression.  We did discuss the fact that, given the chronicity of her symptoms, I do think it unlikely that she regained significant strength in her legs however without decompression it seems essentially impossible that she would ever recover.  Details of the surgery were  reviewed including risks such as spinal cord injury, bleeding, infection, CSF leak, and general risks of surgery including heart attack, stroke, and blood clots.  Expected postoperative course was also discussed including likely return to skilled nursing facility.  Patient is agreeable to surgery, and provided verbal consent to proceed.  All her questions today were answered.

## 2018-06-18 NOTE — TOC Initial Note (Signed)
Transition of Care [facility]) - Initial/Assessment Note    Patient Details  Name: Ellen [name] MRN: [id number] Date of Birth: 1960/09/14  Transition of Care [facility]) CM/SW Contact:    Pollie Friar, RN Phone Number: 06/18/2018, 1:09 PM  Clinical Narrative:                 CM met with the patient and she currently is at [facility] for rehab. She was home with fiance prior.  Pt is accepting to go back to [facility] post surgery if needed. CSW updated.  Expected Discharge Plan: [place] Barriers to Discharge: Continued Medical Work up   Patient Goals and CMS Choice Patient states their goals for this hospitalization and ongoing recovery are:: to get back to my home      Expected Discharge Plan and Services Expected Discharge Plan: [place] In-house Referral: Clinical Social Work Discharge Planning Services: CM Consult   Living arrangements for the past 2 months: [place], Single Family Home([facility]// was at home prior with finance)                                      Prior Living Arrangements/Services Living arrangements for the past 2 months: [place], Single Family Home([facility]// was at home prior with finance)   Patient language and need for interpreter reviewed:: Yes(no needs) Do you feel safe going back to the place where you live?: Yes          Current home services: DME(shower chair, walker, wheelchair, hospital bed) Criminal Activity/Legal Involvement Pertinent to Current Situation/Hospitalization: No - Comment as needed  Activities of Daily Living Home Assistive Devices/Equipment: None ADL Screening (condition at time of admission) Patient's cognitive ability adequate to safely complete daily activities?: Yes Is the patient deaf or have difficulty hearing?: No Does the patient have difficulty seeing, even when wearing glasses/contacts?: No Does the patient have  difficulty concentrating, remembering, or making decisions?: No Patient able to express need for assistance with ADLs?: Yes Does the patient have difficulty dressing or bathing?: Yes Independently performs ADLs?: No Communication: Needs assistance Is this a change from baseline?: Change from baseline, expected to last <3 days Dressing (OT): Needs assistance Is this a change from baseline?: Change from baseline, expected to last <3days Grooming: Needs assistance Is this a change from baseline?: Change from baseline, expected to last <3 days Feeding: Independent Bathing: Needs assistance Is this a change from baseline?: Change from baseline, expected to last <3 days Toileting: Needs assistance Is this a change from baseline?: Change from baseline, expected to last <3 days In/Out Bed: Needs assistance Is this a change from baseline?: Change from baseline, expected to last <3 days Walks in Home: Needs assistance Is this a change from baseline?: Change from baseline, expected to last <3 days Does the patient have difficulty walking or climbing stairs?: Yes Weakness of Legs: Both Weakness of Arms/Hands: None  Permission Sought/Granted                  Emotional Assessment Appearance:: Appears stated age Attitude/Demeanor/Rapport: Engaged Affect (typically observed): Accepting, Appropriate, Pleasant Orientation: : Oriented to Self, Oriented to Place, Oriented to  Time, Oriented to Situation   Psych Involvement: No (comment)  Admission diagnosis:  Weakness of both lower extremities [R29.898] Urinary incontinence, unspecified type [R32] Chronic bilateral low back pain, unspecified whether sciatica  present [M54.5, G89.29] Back pain [M54.9] Patient Active Problem List   Diagnosis Date Noted  . Back pain 06/17/2018  . OSA (obstructive sleep apnea) 06/17/2018  . Hypercalcemia 06/17/2018  . Anxiety 04/06/2017  . Acute respiratory failure with hypoxia ([place])   . Acute hypoxemic  respiratory failure ([place]) 04/05/2017  . SIRS (systemic inflammatory response syndrome) ([place]) 04/05/2017  . Elevated troponin 04/05/2017  . Hyponatremia 04/05/2017  . Urgency of micturation 04/14/2015  . Dizziness 04/14/2015  . Neck pain 03/31/2015  . Abnormal uterine bleeding 08/29/2012  . Incarcerated ventral hernia 09/11/2010  . COUGH 05/13/2009  . ALLERGIC RHINITIS, SEASONAL 05/26/2008  . SHOULDER PAIN, LEFT 08/11/2007  . PRURITUS, VAGINAL 09/30/2006  . BACK PAIN 08/05/2006  . 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 LIPOMA 01/08/2006  . Hyperlipidemia 01/08/2006  . Obesity, morbid, BMI 50 or higher ([place]) 01/08/2006  . DEPRESSION 01/08/2006  . Essential hypertension 01/08/2006  . BRONCHITIS NOS 01/08/2006  . GERD 01/08/2006  . HERNIA, VENTRAL 01/08/2006  . ENDOMETRIAL POLYP 01/08/2006  . DEGENERATIVE JOINT DISEASE 01/08/2006   PCP:  Deborah Chalk, FNP Pharmacy:  No Pharmacies Listed    Social Determinants of Health (SDOH) Interventions    Readmission Risk Interventions No flowsheet data found.

## 2018-06-19 ENCOUNTER — Inpatient Hospital Stay (HOSPITAL_COMMUNITY): Payer: Medicare HMO | Admitting: Anesthesiology

## 2018-06-19 ENCOUNTER — Encounter (HOSPITAL_COMMUNITY): Payer: Self-pay | Admitting: Certified Registered Nurse Anesthetist

## 2018-06-19 ENCOUNTER — Inpatient Hospital Stay (HOSPITAL_COMMUNITY): Payer: Medicare HMO

## 2018-06-19 ENCOUNTER — Encounter (HOSPITAL_COMMUNITY)
Admission: EM | Disposition: A | Discharge status: Skilled Nursing Facility | Payer: Self-pay | Source: Skilled Nursing Facility | Attending: Internal Medicine

## 2018-06-19 DIAGNOSIS — M549 Dorsalgia, unspecified: Secondary | ICD-10-CM

## 2018-06-19 DIAGNOSIS — M545 Low back pain: Secondary | ICD-10-CM

## 2018-06-19 DIAGNOSIS — G8929 Other chronic pain: Secondary | ICD-10-CM

## 2018-06-19 DIAGNOSIS — I10 Essential (primary) hypertension: Secondary | ICD-10-CM

## 2018-06-19 HISTORY — PX: LUMBAR LAMINECTOMY/DECOMPRESSION MICRODISCECTOMY: SHX5026

## 2018-06-19 LAB — MULTIPLE MYELOMA PANEL, SERUM
Albumin SerPl Elph-Mcnc: 2.9 g/dL (ref 2.9–4.4)
Albumin/Glob SerPl: 1.2 (ref 0.7–1.7)
Alpha 1: 0.2 g/dL (ref 0.0–0.4)
Alpha2 Glob SerPl Elph-Mcnc: 0.5 g/dL (ref 0.4–1.0)
B-Globulin SerPl Elph-Mcnc: 1 g/dL (ref 0.7–1.3)
Gamma Glob SerPl Elph-Mcnc: 0.9 g/dL (ref 0.4–1.8)
Globulin, Total: 2.5 g/dL (ref 2.2–3.9)
IgA: 278 mg/dL (ref 87–352)
IgG (Immunoglobin G), Serum: 1132 mg/dL (ref 586–1602)
IgM (Immunoglobulin M), Srm: 38 mg/dL (ref 26–217)
Total Protein ELP: 5.4 g/dL — ABNORMAL LOW (ref 6.0–8.5)

## 2018-06-19 LAB — COMPREHENSIVE METABOLIC PANEL
ALT: 12 U/L (ref 0–44)
AST: 14 U/L — ABNORMAL LOW (ref 15–41)
Albumin: 2.7 g/dL — ABNORMAL LOW (ref 3.5–5.0)
Alkaline Phosphatase: 95 U/L (ref 38–126)
Anion gap: 3 — ABNORMAL LOW (ref 5–15)
BUN: 9 mg/dL (ref 6–20)
CO2: 31 mmol/L (ref 22–32)
Calcium: 12.2 mg/dL — ABNORMAL HIGH (ref 8.9–10.3)
Chloride: 107 mmol/L (ref 98–111)
Creatinine, Ser: 0.59 mg/dL (ref 0.44–1.00)
GFR calc Af Amer: >60 mL/min (ref >60)
GFR calc non Af Amer: >60 mL/min (ref >60)
Glucose, Bld: 102 mg/dL — ABNORMAL HIGH (ref 70–99)
Potassium: 4.2 mmol/L (ref 3.5–5.1)
Sodium: 141 mmol/L (ref 135–145)
Total Bilirubin: 0.7 mg/dL (ref 0.3–1.2)
Total Protein: 5.8 g/dL — ABNORMAL LOW (ref 6.5–8.1)

## 2018-06-19 LAB — FOLATE RBC
Folate, Hemolysate: 346 ng/mL
Folate, RBC: 991 ng/mL (ref >498)
Hematocrit: 34.9 % (ref 34.0–46.6)

## 2018-06-19 LAB — CBC
HCT: 36 % (ref 36.0–46.0)
Hemoglobin: 11.6 g/dL — ABNORMAL LOW (ref 12.0–15.0)
MCH: 31.4 pg (ref 26.0–34.0)
MCHC: 32.2 g/dL (ref 30.0–36.0)
MCV: 97.3 fL (ref 80.0–100.0)
Platelets: 291 10*3/uL (ref 150–400)
RBC: 3.7 MIL/uL — ABNORMAL LOW (ref 3.87–5.11)
RDW: 11.9 % (ref 11.5–15.5)
WBC: 5.9 10*3/uL (ref 4.0–10.5)
nRBC: 0 % (ref 0.0–0.2)

## 2018-06-19 LAB — CALCIUM, IONIZED: Calcium, Ionized, Serum: 6.8 mg/dL — ABNORMAL HIGH (ref 4.5–5.6)

## 2018-06-19 LAB — PARATHYROID HORMONE, INTACT (NO CA): PTH: 35 pg/mL (ref 15–65)

## 2018-06-19 SURGERY — LUMBAR LAMINECTOMY/DECOMPRESSION MICRODISCECTOMY 1 LEVEL
Anesthesia: General | Site: Spine Thoracic

## 2018-06-19 MED ORDER — ROCURONIUM BROMIDE 10 MG/ML (PF) SYRINGE
PREFILLED_SYRINGE | INTRAVENOUS | Status: AC
  Filled 2018-06-19: qty 20

## 2018-06-19 MED ORDER — MENTHOL 3 MG MT LOZG: 1.0000 | LOZENGE | OROMUCOSAL | Status: DC | PRN

## 2018-06-19 MED ORDER — HYDROMORPHONE HCL 1 MG/ML IJ SOLN
0.2500 mg | INTRAMUSCULAR | Status: DC | PRN
  Administered 2018-06-19 (×3): 0.5 mg via INTRAVENOUS

## 2018-06-19 MED ORDER — ACETAMINOPHEN 500 MG PO TABS
1000.0000 mg | ORAL_TABLET | Freq: Four times a day (QID) | ORAL | Status: AC
  Administered 2018-06-19 – 2018-06-20 (×2): 1000 mg via ORAL
  Filled 2018-06-19 (×2): qty 2

## 2018-06-19 MED ORDER — LIDOCAINE 2% (20 MG/ML) 5 ML SYRINGE
INTRAMUSCULAR | Status: DC | PRN
  Administered 2018-06-19: 60 mg via INTRAVENOUS

## 2018-06-19 MED ORDER — SODIUM CHLORIDE 0.9 % IV SOLN
INTRAVENOUS | Status: DC | PRN
  Administered 2018-06-19: 09:00:00 30 ug/min via INTRAVENOUS

## 2018-06-19 MED ORDER — PROPOFOL 10 MG/ML IV BOLUS
INTRAVENOUS | Status: DC | PRN
  Administered 2018-06-19: 200 mg via INTRAVENOUS

## 2018-06-19 MED ORDER — HYDROCODONE-ACETAMINOPHEN 5-325 MG PO TABS
1.0000 | ORAL_TABLET | ORAL | Status: DC | PRN
  Administered 2018-06-19 – 2018-06-23 (×5): 1 via ORAL
  Filled 2018-06-19 (×5): qty 1

## 2018-06-19 MED ORDER — ONDANSETRON HCL 4 MG/2ML IJ SOLN
4.0000 mg | Freq: Four times a day (QID) | INTRAMUSCULAR | Status: DC | PRN
  Administered 2018-06-19 – 2018-06-21 (×3): 4 mg via INTRAVENOUS
  Filled 2018-06-19 (×4): qty 2

## 2018-06-19 MED ORDER — ONDANSETRON HCL 4 MG/2ML IJ SOLN
INTRAMUSCULAR | Status: AC
  Filled 2018-06-19: qty 4

## 2018-06-19 MED ORDER — SODIUM CHLORIDE 0.9 % IV SOLN
INTRAVENOUS | Status: DC
  Administered 2018-06-19: 14:00:00 via INTRAVENOUS

## 2018-06-19 MED ORDER — SODIUM CHLORIDE 0.9 % IV SOLN
INTRAVENOUS | Status: DC | PRN
  Administered 2018-06-19: 500 mL

## 2018-06-19 MED ORDER — LIDOCAINE 2% (20 MG/ML) 5 ML SYRINGE
INTRAMUSCULAR | Status: AC
  Filled 2018-06-19: qty 10

## 2018-06-19 MED ORDER — ACETAMINOPHEN 10 MG/ML IV SOLN
1000.0000 mg | Freq: Once | INTRAVENOUS | Status: DC | PRN
  Administered 2018-06-19: 1000 mg via INTRAVENOUS

## 2018-06-19 MED ORDER — SUGAMMADEX SODIUM 200 MG/2ML IV SOLN
INTRAVENOUS | Status: DC | PRN
  Administered 2018-06-19: 350 mg via INTRAVENOUS

## 2018-06-19 MED ORDER — DEXAMETHASONE SODIUM PHOSPHATE 10 MG/ML IJ SOLN
INTRAMUSCULAR | Status: AC
  Filled 2018-06-19: qty 2

## 2018-06-19 MED ORDER — BISACODYL 10 MG RE SUPP: 10.0000 mg | Freq: Every day | RECTAL | Status: DC | PRN

## 2018-06-19 MED ORDER — 0.9 % SODIUM CHLORIDE (POUR BTL) OPTIME
TOPICAL | Status: DC | PRN
  Administered 2018-06-19: 10:00:00 1000 mL

## 2018-06-19 MED ORDER — METHOCARBAMOL 1000 MG/10ML IJ SOLN
500.0000 mg | Freq: Four times a day (QID) | INTRAVENOUS | Status: DC | PRN
  Filled 2018-06-19: qty 5

## 2018-06-19 MED ORDER — DOCUSATE SODIUM 100 MG PO CAPS
100.0000 mg | ORAL_CAPSULE | Freq: Two times a day (BID) | ORAL | Status: DC
  Administered 2018-06-19 – 2018-06-26 (×13): 100 mg via ORAL
  Filled 2018-06-19 (×16): qty 1

## 2018-06-19 MED ORDER — EPHEDRINE 5 MG/ML INJ
INTRAVENOUS | Status: AC
  Filled 2018-06-19: qty 10

## 2018-06-19 MED ORDER — SODIUM CHLORIDE 0.9% FLUSH
3.0000 mL | Freq: Two times a day (BID) | INTRAVENOUS | Status: DC
  Administered 2018-06-22 – 2018-06-23 (×4): 3 mL via INTRAVENOUS

## 2018-06-19 MED ORDER — ROCURONIUM BROMIDE 10 MG/ML (PF) SYRINGE
PREFILLED_SYRINGE | INTRAVENOUS | Status: DC | PRN
  Administered 2018-06-19: 70 mg via INTRAVENOUS

## 2018-06-19 MED ORDER — MIDAZOLAM HCL 2 MG/2ML IJ SOLN
INTRAMUSCULAR | Status: DC | PRN
  Administered 2018-06-19: 2 mg via INTRAVENOUS

## 2018-06-19 MED ORDER — FENTANYL CITRATE (PF) 250 MCG/5ML IJ SOLN
INTRAMUSCULAR | Status: DC | PRN
  Administered 2018-06-19: 50 ug via INTRAVENOUS
  Administered 2018-06-19: 150 ug via INTRAVENOUS

## 2018-06-19 MED ORDER — LACTATED RINGERS IV SOLN
INTRAVENOUS | Status: DC
  Administered 2018-06-19: 08:00:00 via INTRAVENOUS

## 2018-06-19 MED ORDER — ACETAMINOPHEN 650 MG RE SUPP: 650.0000 mg | RECTAL | Status: DC | PRN

## 2018-06-19 MED ORDER — DEXTROSE 5 % IV SOLN
3.0000 g | INTRAVENOUS | Status: AC
  Administered 2018-06-19: 3 g via INTRAVENOUS
  Filled 2018-06-19: qty 3000

## 2018-06-19 MED ORDER — LIDOCAINE-EPINEPHRINE 1 %-1:100000 IJ SOLN
INTRAMUSCULAR | Status: DC | PRN
  Administered 2018-06-19: 4.5 mL

## 2018-06-19 MED ORDER — ONDANSETRON HCL 4 MG PO TABS
4.0000 mg | ORAL_TABLET | Freq: Four times a day (QID) | ORAL | Status: DC | PRN
  Administered 2018-06-22: 4 mg via ORAL
  Filled 2018-06-19: qty 1

## 2018-06-19 MED ORDER — ACETAMINOPHEN 10 MG/ML IV SOLN
INTRAVENOUS | Status: AC
  Filled 2018-06-19: qty 100

## 2018-06-19 MED ORDER — FLEET ENEMA 7-19 GM/118ML RE ENEM: 1.0000 | ENEMA | Freq: Once | RECTAL | Status: DC | PRN

## 2018-06-19 MED ORDER — ONDANSETRON HCL 4 MG/2ML IJ SOLN
INTRAMUSCULAR | Status: DC | PRN
  Administered 2018-06-19: 4 mg via INTRAVENOUS

## 2018-06-19 MED ORDER — PROPOFOL 10 MG/ML IV BOLUS
INTRAVENOUS | Status: AC
  Filled 2018-06-19: qty 40

## 2018-06-19 MED ORDER — SODIUM CHLORIDE 0.9% FLUSH
3.0000 mL | INTRAVENOUS | Status: DC | PRN
  Administered 2018-06-23: 3 mL via INTRAVENOUS
  Filled 2018-06-19: qty 3

## 2018-06-19 MED ORDER — HYDROMORPHONE HCL 1 MG/ML IJ SOLN
INTRAMUSCULAR | Status: AC
  Filled 2018-06-19: qty 1

## 2018-06-19 MED ORDER — BUPIVACAINE HCL (PF) 0.5 % IJ SOLN
INTRAMUSCULAR | Status: DC | PRN
  Administered 2018-06-19: 4.5 mL

## 2018-06-19 MED ORDER — METHOCARBAMOL 500 MG PO TABS
500.0000 mg | ORAL_TABLET | Freq: Four times a day (QID) | ORAL | Status: DC | PRN
  Administered 2018-06-19 – 2018-06-27 (×10): 500 mg via ORAL
  Filled 2018-06-19 (×9): qty 1

## 2018-06-19 MED ORDER — THROMBIN 5000 UNITS EX SOLR
CUTANEOUS | Status: AC
  Filled 2018-06-19: qty 5000

## 2018-06-19 MED ORDER — PHENYLEPHRINE 40 MCG/ML (10ML) SYRINGE FOR IV PUSH (FOR BLOOD PRESSURE SUPPORT)
PREFILLED_SYRINGE | INTRAVENOUS | Status: AC
  Filled 2018-06-19: qty 10

## 2018-06-19 MED ORDER — HYDROMORPHONE HCL 1 MG/ML IJ SOLN
0.5000 mg | INTRAMUSCULAR | Status: DC | PRN
  Administered 2018-06-19 – 2018-06-21 (×4): 0.5 mg via INTRAVENOUS
  Administered 2018-06-23: 1 mg via INTRAVENOUS
  Filled 2018-06-19 (×3): qty 0.5
  Filled 2018-06-19: qty 1
  Filled 2018-06-19: qty 0.5

## 2018-06-19 MED ORDER — METHOCARBAMOL 500 MG PO TABS
ORAL_TABLET | ORAL | Status: AC
  Filled 2018-06-19: qty 1

## 2018-06-19 MED ORDER — OXYCODONE HCL 5 MG PO TABS
ORAL_TABLET | ORAL | Status: AC
  Filled 2018-06-19: qty 1

## 2018-06-19 MED ORDER — SENNOSIDES-DOCUSATE SODIUM 8.6-50 MG PO TABS: 1.0000 | ORAL_TABLET | Freq: Every evening | ORAL | Status: DC | PRN

## 2018-06-19 MED ORDER — SUCCINYLCHOLINE CHLORIDE 20 MG/ML IJ SOLN
INTRAMUSCULAR | Status: DC | PRN
  Administered 2018-06-19: 160 mg via INTRAVENOUS

## 2018-06-19 MED ORDER — MIDAZOLAM HCL 2 MG/2ML IJ SOLN
INTRAMUSCULAR | Status: AC
  Filled 2018-06-19: qty 2

## 2018-06-19 MED ORDER — OXYCODONE HCL 5 MG PO TABS
5.0000 mg | ORAL_TABLET | ORAL | Status: DC | PRN
  Administered 2018-06-19 – 2018-06-21 (×2): 5 mg via ORAL
  Administered 2018-06-23 – 2018-06-24 (×2): 10 mg via ORAL
  Administered 2018-06-25: 5 mg via ORAL
  Administered 2018-06-25 – 2018-06-27 (×7): 10 mg via ORAL
  Filled 2018-06-19 (×2): qty 2
  Filled 2018-06-19: qty 1
  Filled 2018-06-19 (×5): qty 2
  Filled 2018-06-19: qty 1
  Filled 2018-06-19 (×2): qty 2

## 2018-06-19 MED ORDER — BUPIVACAINE HCL (PF) 0.5 % IJ SOLN
INTRAMUSCULAR | Status: AC
  Filled 2018-06-19: qty 30

## 2018-06-19 MED ORDER — FENTANYL CITRATE (PF) 250 MCG/5ML IJ SOLN
INTRAMUSCULAR | Status: AC
  Filled 2018-06-19: qty 5

## 2018-06-19 MED ORDER — SUCCINYLCHOLINE CHLORIDE 200 MG/10ML IV SOSY
PREFILLED_SYRINGE | INTRAVENOUS | Status: AC
  Filled 2018-06-19: qty 20

## 2018-06-19 MED ORDER — CEFAZOLIN SODIUM-DEXTROSE 2-4 GM/100ML-% IV SOLN
2.0000 g | Freq: Three times a day (TID) | INTRAVENOUS | Status: AC
  Administered 2018-06-19 – 2018-06-20 (×2): 2 g via INTRAVENOUS
  Filled 2018-06-19 (×2): qty 100

## 2018-06-19 MED ORDER — DEXAMETHASONE SODIUM PHOSPHATE 10 MG/ML IJ SOLN
INTRAMUSCULAR | Status: DC | PRN
  Administered 2018-06-19: 10 mg via INTRAVENOUS

## 2018-06-19 MED ORDER — LIDOCAINE-EPINEPHRINE 1 %-1:100000 IJ SOLN
INTRAMUSCULAR | Status: AC
  Filled 2018-06-19: qty 1

## 2018-06-19 MED ORDER — SENNA 8.6 MG PO TABS
1.0000 | ORAL_TABLET | Freq: Two times a day (BID) | ORAL | Status: DC
  Administered 2018-06-19 – 2018-06-26 (×13): 8.6 mg via ORAL
  Filled 2018-06-19 (×16): qty 1

## 2018-06-19 MED ORDER — SUGAMMADEX SODIUM 500 MG/5ML IV SOLN
INTRAVENOUS | Status: AC
  Filled 2018-06-19: qty 5

## 2018-06-19 MED ORDER — SODIUM CHLORIDE 0.9 % IV SOLN: 250.0000 mL | INTRAVENOUS | Status: DC

## 2018-06-19 MED ORDER — GABAPENTIN 300 MG PO CAPS: 300.0000 mg | ORAL_CAPSULE | Freq: Three times a day (TID) | ORAL | Status: DC

## 2018-06-19 MED ORDER — ACETAMINOPHEN 325 MG PO TABS
650.0000 mg | ORAL_TABLET | ORAL | Status: DC | PRN
  Administered 2018-06-23 – 2018-06-25 (×4): 650 mg via ORAL
  Filled 2018-06-19 (×5): qty 2

## 2018-06-19 MED ORDER — THROMBIN 5000 UNITS EX SOLR
OROMUCOSAL | Status: DC | PRN
  Administered 2018-06-19: 10:00:00 via TOPICAL

## 2018-06-19 MED ORDER — PROMETHAZINE HCL 25 MG/ML IJ SOLN: 6.2500 mg | INTRAMUSCULAR | Status: DC | PRN

## 2018-06-19 MED ORDER — PHENOL 1.4 % MT LIQD: 1.0000 | OROMUCOSAL | Status: DC | PRN

## 2018-06-19 SURGICAL SUPPLY — 68 items
ADH SKN CLS APL DERMABOND .7 (GAUZE/BANDAGES/DRESSINGS) ×1
APL SKNCLS STERI-STRIP NONHPOA (GAUZE/BANDAGES/DRESSINGS)
BAG DECANTER FOR FLEXI CONT (MISCELLANEOUS) ×3 IMPLANT
BENZOIN TINCTURE PRP APPL 2/3 (GAUZE/BANDAGES/DRESSINGS) IMPLANT
BLADE CLIPPER SURG (BLADE) IMPLANT
BLADE SURG 11 STRL SS (BLADE) ×3 IMPLANT
BUR MATCHSTICK NEURO 3.0 LAGG (BURR) IMPLANT
BUR PRECISION FLUTE 5.0 (BURR) ×2 IMPLANT
CANISTER SUCT 3000ML PPV (MISCELLANEOUS) ×3 IMPLANT
CARTRIDGE OIL MAESTRO DRILL (MISCELLANEOUS) ×1 IMPLANT
CLOSURE WOUND 1/2 X4 (GAUZE/BANDAGES/DRESSINGS)
COVER WAND RF STERILE (DRAPES) ×1 IMPLANT
DECANTER SPIKE VIAL GLASS SM (MISCELLANEOUS) ×3 IMPLANT
DERMABOND ADVANCED (GAUZE/BANDAGES/DRESSINGS) ×2
DERMABOND ADVANCED .7 DNX12 (GAUZE/BANDAGES/DRESSINGS) ×1 IMPLANT
DIFFUSER DRILL AIR PNEUMATIC (MISCELLANEOUS) ×3 IMPLANT
DRAPE LAPAROTOMY 100X72X124 (DRAPES) ×3 IMPLANT
DRAPE MICROSCOPE LEICA (MISCELLANEOUS) ×3 IMPLANT
DRAPE SURG 17X23 STRL (DRAPES) ×3 IMPLANT
DRSG OPSITE POSTOP 3X4 (GAUZE/BANDAGES/DRESSINGS) ×1 IMPLANT
DRSG OPSITE POSTOP 4X6 (GAUZE/BANDAGES/DRESSINGS) ×2 IMPLANT
DURAPREP 26ML APPLICATOR (WOUND CARE) ×3 IMPLANT
ELECT BLADE 4.0 EZ CLEAN MEGAD (MISCELLANEOUS) ×3
ELECT REM PT RETURN 9FT ADLT (ELECTROSURGICAL) ×3
ELECTRODE BLDE 4.0 EZ CLN MEGD (MISCELLANEOUS) IMPLANT
ELECTRODE REM PT RTRN 9FT ADLT (ELECTROSURGICAL) ×1 IMPLANT
GAUZE 4X4 16PLY RFD (DISPOSABLE) IMPLANT
GAUZE SPONGE 4X4 12PLY STRL (GAUZE/BANDAGES/DRESSINGS) IMPLANT
GLOVE BIO SURGEON STRL SZ 6.5 (GLOVE) ×4 IMPLANT
GLOVE BIO SURGEON STRL SZ7.5 (GLOVE) ×2 IMPLANT
GLOVE BIO SURGEONS STRL SZ 6.5 (GLOVE) ×4
GLOVE BIOGEL PI IND STRL 6.5 (GLOVE) IMPLANT
GLOVE BIOGEL PI IND STRL 7.0 (GLOVE) IMPLANT
GLOVE BIOGEL PI IND STRL 7.5 (GLOVE) ×1 IMPLANT
GLOVE BIOGEL PI INDICATOR 6.5 (GLOVE) ×4
GLOVE BIOGEL PI INDICATOR 7.0 (GLOVE) ×2
GLOVE BIOGEL PI INDICATOR 7.5 (GLOVE) ×4
GLOVE ECLIPSE 7.0 STRL STRAW (GLOVE) ×3 IMPLANT
GLOVE EXAM NITRILE XL STR (GLOVE) IMPLANT
GOWN STRL REUS W/ TWL LRG LVL3 (GOWN DISPOSABLE) ×2 IMPLANT
GOWN STRL REUS W/ TWL XL LVL3 (GOWN DISPOSABLE) IMPLANT
GOWN STRL REUS W/TWL 2XL LVL3 (GOWN DISPOSABLE) IMPLANT
GOWN STRL REUS W/TWL LRG LVL3 (GOWN DISPOSABLE) ×9
GOWN STRL REUS W/TWL XL LVL3 (GOWN DISPOSABLE)
HEMOSTAT POWDER KIT SURGIFOAM (HEMOSTASIS) ×3 IMPLANT
KIT BASIN OR (CUSTOM PROCEDURE TRAY) ×3 IMPLANT
KIT TURNOVER KIT B (KITS) ×3 IMPLANT
NDL HYPO 18GX1.5 BLUNT FILL (NEEDLE) IMPLANT
NDL SPNL 18GX3.5 QUINCKE PK (NEEDLE) IMPLANT
NEEDLE HYPO 18GX1.5 BLUNT FILL (NEEDLE) IMPLANT
NEEDLE HYPO 22GX1.5 SAFETY (NEEDLE) ×3 IMPLANT
NEEDLE SPNL 18GX3.5 QUINCKE PK (NEEDLE) ×3 IMPLANT
NS IRRIG 1000ML POUR BTL (IV SOLUTION) ×3 IMPLANT
OIL CARTRIDGE MAESTRO DRILL (MISCELLANEOUS) ×3
PACK LAMINECTOMY NEURO (CUSTOM PROCEDURE TRAY) ×3 IMPLANT
PAD ARMBOARD 7.5X6 YLW CONV (MISCELLANEOUS) ×19 IMPLANT
RUBBERBAND STERILE (MISCELLANEOUS) ×6 IMPLANT
SPONGE LAP 4X18 RFD (DISPOSABLE) IMPLANT
SPONGE SURGIFOAM ABS GEL SZ50 (HEMOSTASIS) ×1 IMPLANT
STRIP CLOSURE SKIN 1/2X4 (GAUZE/BANDAGES/DRESSINGS) IMPLANT
SUT VIC AB 0 CT1 18XCR BRD8 (SUTURE) ×1 IMPLANT
SUT VIC AB 0 CT1 8-18 (SUTURE) ×6
SUT VIC AB 2-0 CT1 18 (SUTURE) IMPLANT
SUT VICRYL 3-0 RB1 18 ABS (SUTURE) ×6 IMPLANT
SYR 3ML LL SCALE MARK (SYRINGE) IMPLANT
TOWEL GREEN STERILE (TOWEL DISPOSABLE) ×3 IMPLANT
TOWEL GREEN STERILE FF (TOWEL DISPOSABLE) ×3 IMPLANT
WATER STERILE IRR 1000ML POUR (IV SOLUTION) ×3 IMPLANT

## 2018-06-19 NOTE — Transfer of Care (Signed)
Immediate Anesthesia Transfer of Care Note  Patient: Ellen [name]  Procedure(s) Performed: THORACIC TEN-THORACIC ELEVEN  LAMINECTOMY (N/A Spine Thoracic)  Patient Location: PACU  Anesthesia Type:General  Level of Consciousness: drowsy and patient cooperative  Airway & Oxygen Therapy: Patient Spontanous Breathing and Patient connected to face mask oxygen  Post-op Assessment: Report given to RN, Post -op Vital signs reviewed and stable and Patient moving all extremities X 4  Post vital signs: Reviewed and stable  Last Vitals:  Vitals Value Taken Time  BP 120/61 06/19/2018 11:00 AM  Temp    Pulse 63 06/19/2018 11:00 AM  Resp 16 06/19/2018 11:00 AM  SpO2 100 % 06/19/2018 11:00 AM  Vitals shown include unvalidated device data.  Last Pain:  Vitals:   06/19/18 0806  TempSrc: Oral  PainSc: 10-Worst pain ever      Patients Stated Pain Goal: 3 (46/65/99 3570)  Complications: No apparent anesthesia complications

## 2018-06-19 NOTE — Anesthesia Postprocedure Evaluation (Signed)
Anesthesia Post Note  Patient: Ellen [name]  Procedure(s) Performed: THORACIC TEN-THORACIC ELEVEN  LAMINECTOMY (N/A Spine Thoracic)     Patient location during evaluation: PACU Anesthesia Type: General Level of consciousness: awake and alert Pain management: pain level controlled Vital Signs Assessment: post-procedure vital signs reviewed and stable Respiratory status: spontaneous breathing, nonlabored ventilation, respiratory function stable and patient connected to nasal cannula oxygen Cardiovascular status: blood pressure returned to baseline and stable Postop Assessment: no apparent nausea or vomiting Anesthetic complications: no    Last Vitals:  Vitals:   06/19/18 1356 06/19/18 1528  BP: 124/79 (!) 141/78  Pulse: 67 75  Resp: 17 18  Temp: 36.9 C 37.2 C  SpO2: 99% 100%    Last Pain:  Vitals:   06/19/18 1617  TempSrc:   PainSc: 6                  Fenna Semel P Yariah Selvey

## 2018-06-19 NOTE — Anesthesia Procedure Notes (Signed)
Procedure Name: Intubation Date/Time: 06/19/2018 9:03 AM Performed by: Julieta Bellini, CRNA Pre-anesthesia Checklist: Patient identified, Emergency Drugs available, Suction available and Patient being monitored Patient Re-evaluated:Patient Re-evaluated prior to induction Oxygen Delivery Method: Circle system utilized Preoxygenation: Pre-oxygenation with 100% oxygen Induction Type: IV induction and Rapid sequence Laryngoscope Size: Mac and 3 Grade View: Grade I Tube type: Oral Tube size: 7.5 mm Number of attempts: 1 Airway Equipment and Method: Stylet and Oral airway Placement Confirmation: ETT inserted through vocal cords under direct vision,  positive ETCO2 and breath sounds checked- equal and bilateral Secured at: 22 cm Tube secured with: Tape Dental Injury: Teeth and Oropharynx as per pre-operative assessment

## 2018-06-19 NOTE — Progress Notes (Signed)
  NEUROSURGERY PROGRESS NOTE   Called husband, Barbaraann Rondo, and updated him regarding surgery. Total time spent 6 minutes.  Ferne Reus, PA-C [facility] [employer]

## 2018-06-19 NOTE — Progress Notes (Signed)
PROGRESS NOTE    Ellen [name]  UXN:[id number] DOB: 1961/02/01 DOA: 06/17/2018 PCP: Deborah Chalk, FNP  Brief Narrative: 58 year old morbidly obese female with history of OSA on CPAP, hypertension, dyslipidemia, started having low back pain, tingling numbness in both lower extremities and gait disorder in January/February following a mechanical fall after this she was still able to perform some ADLs with difficulty, unfortunately suffered another fall in mid March and since then has been unable to move both lower extremities she was admitted to [facility] regional hospital and was subsequently referred for physical therapy. -Presented to the ED yesterday due to worsening low back pain and weakness endorses numbness from umbilicus down. -History of urinary incontinence -Neurosurgery consulted due to paraplegia at T8 11-T12 level with diffuse multilevel stenosis, neurosurgery consulted, plan for surgical decompression/laminectomy given chronicity of her symptoms unlikely she will regain significant strength however any recovery is impossible without surgery  Assessment & Plan:   Severe multilevel spinal stenosis with paraplegia  -Has almost complete loss of strength and sensations in both lower extremity, bladder incontinence -Greatly appreciate neurosurgical input -Plan for decompressive laminectomy today -Continue Lyrica, tramadol, caution with oversedation -Due to chronicity of symptoms less chances of significant recovery -Start physical therapy postop -Start DVT prophylaxis postop -Labs in a.m.  Hypercalcemia -corrected Ca got albumin is not that high at 11.2 -PTH being normal suggests possibility of primary hyperparathyroidism -Will await the rest of the work-up including vitamin D level etc. -will d/w endocrine, need to check 24H urine for Arial  Hypomagnesemia -Replaced  Anemia -Follow-up anemia panel, monitor  Hypertension -Continue amlodipine and enalapril  Depression  -Continue Lexapro  OSA -Continue CPAP nightly  Morbid obesity -BMI of 63 -Advised lifestyle modification, caution with oversedation  DVT prophylaxis: Heparin subcutaneous Code Status: Full code  family Communication:no Family at bedside will update Disposition Plan: May likely need rehab  Consultants:   Neurosurgery   Procedures:   Antimicrobials:    Subjective: -Anxious about upcoming surgery -Denies any chest pain or dyspnea  Objective: Vitals:   06/19/18 0337 06/19/18 0500 06/19/18 0806 06/19/18 1100  BP: 120/64   120/61  Pulse: (!) 55   64  Resp: 20   15  Temp: 98.1 F (36.7 C)  98.4 F (36.9 C) (!) 97.2 F (36.2 C)  TempSrc: Oral  Oral   SpO2: 99%   100%  Weight:  (!) 172.6 kg (!) 172.6 kg   Height:   5\' 4"  (1.626 m)     Intake/Output Summary (Last 24 hours) at 06/19/2018 1118 Last data filed at 06/19/2018 1101 Gross per 24 hour  Intake 550 ml  Output 3275 ml  Net -2725 ml   Filed Weights   06/18/18 0002 06/19/18 0500 06/19/18 0806  Weight: (!) 167.8 kg (!) 172.6 kg (!) 172.6 kg    Examination: Gen: Morbidly obese chronically ill-appearing female, AAO x3, no distress HEENT: PERRLA, Neck supple, no JVD Lungs: Distant breath sounds otherwise clear CVS: RRR,No Gallops,Rubs or new Murmurs Abd: soft, Non tender, non distended, BS present Extremities: No edema Skin: no new rashes Central nervous system:  Paraplegia, loss of strength and sensations from umbilicus down-unchanged    Data Reviewed:   CBC: Recent Labs  Lab 06/17/18 2032 06/18/18 0444 06/19/18 0416  WBC 6.8 6.5 5.9  NEUTROABS 3.3  --   --   HGB 11.6* 11.3* 11.6*  HCT 35.6* 34.5* 36.0  MCV 97.8 97.5 97.3  PLT 298 284 254   Basic Metabolic Panel:  Recent Labs  Lab 06/17/18 2032 06/18/18 0444 06/19/18 0416  NA 141 142 141  K 3.6 3.6 4.2  CL 107 108 107  CO2 29 29 31   GLUCOSE 108* 107* 102*  BUN 13 12 9   CREATININE 0.66 0.59 0.59  CALCIUM 12.0* 11.9* 12.2*  MG  --   1.6*  --   PHOS  --  2.8  --    GFR: Estimated Creatinine Clearance: 121.8 mL/min (by C-G formula based on SCr of 0.59 mg/dL). Liver Function Tests: Recent Labs  Lab 06/18/18 0444 06/19/18 0416  AST 13* 14*  ALT 12 12  ALKPHOS 95 95  BILITOT 0.6 0.7  PROT 5.9* 5.8*  ALBUMIN 2.8* 2.7*   No results for input(s): LIPASE, AMYLASE in the last 168 hours. No results for input(s): AMMONIA in the last 168 hours. Coagulation Profile: Recent Labs  Lab 06/18/18 0444  INR 1.2   Cardiac Enzymes: No results for input(s): CKTOTAL, CKMB, CKMBINDEX, TROPONINI in the last 168 hours. BNP (last 3 results) No results for input(s): PROBNP in the last 8760 hours. HbA1C: No results for input(s): HGBA1C in the last 72 hours. CBG: No results for input(s): GLUCAP in the last 168 hours. Lipid Profile: No results for input(s): CHOL, HDL, LDLCALC, TRIG, CHOLHDL, LDLDIRECT in the last 72 hours. Thyroid Function Tests: No results for input(s): TSH, T4TOTAL, FREET4, T3FREE, THYROIDAB in the last 72 hours. Anemia Panel: Recent Labs    06/18/18 0444  VITAMINB12 391  FERRITIN 32  TIBC 295  IRON 60   Urine analysis:    Component Value Date/Time   COLORURINE YELLOW 06/18/2018 0524   APPEARANCEUR HAZY (A) 06/18/2018 0524   LABSPEC 1.016 06/18/2018 0524   PHURINE 6.0 06/18/2018 0524   GLUCOSEU NEGATIVE 06/18/2018 0524   GLUCOSEU NEG mg/dL 08/05/2006 2022   HGBUR NEGATIVE 06/18/2018 0524   BILIRUBINUR NEGATIVE 06/18/2018 0524   KETONESUR NEGATIVE 06/18/2018 0524   PROTEINUR NEGATIVE 06/18/2018 0524   UROBILINOGEN 1.0 06/14/2011 1828   NITRITE NEGATIVE 06/18/2018 0524   LEUKOCYTESUR NEGATIVE 06/18/2018 0524   Sepsis Labs: @LABRCNTIP (procalcitonin:4,lacticidven:4)  ) Recent Results (from the past 240 hour(s))  MRSA PCR Screening     Status: None   Collection Time: 06/18/18  1:08 AM  Result Value Ref Range Status   MRSA by PCR NEGATIVE NEGATIVE Final    Comment:        The GeneXpert MRSA  Assay (FDA approved for NASAL specimens only), is one component of a comprehensive MRSA colonization surveillance program. It is not intended to diagnose MRSA infection nor to guide or monitor treatment for MRSA infections. Performed at [facility], [place] [address] [place], [place] [address]          Radiology Studies: No results found.      Scheduled Meds: . acetaminophen  1,000 mg Oral Q6H  . [MAR Hold] amLODipine  5 mg Oral Daily  . [MAR Hold] atorvastatin  20 mg Oral q1800  . docusate sodium  100 mg Oral BID  . [MAR Hold] enalapril  20 mg Oral Daily  . [MAR Hold] escitalopram  10 mg Oral Daily  . [MAR Hold] oxyCODONE-acetaminophen  1 tablet Oral QHS  . [MAR Hold] pantoprazole  40 mg Oral Daily  . [MAR Hold] pregabalin  75 mg Oral BID  . senna  1 tablet Oral BID  . [MAR Hold] sodium chloride flush  3 mL Intravenous Q12H  . sodium chloride flush  3 mL Intravenous Q12H  . [  MAR Hold] vitamin B-12  1,000 mcg Oral Daily  . [MAR Hold] Vitamin D (Ergocalciferol)  50,000 Units Oral Q Mon   Continuous Infusions: . [MAR Hold] sodium chloride    . sodium chloride    . sodium chloride    . acetaminophen    .  ceFAZolin (ANCEF) IV    . lactated ringers 10 mL/hr at 06/19/18 0825  . methocarbamol (ROBAXIN) IV       LOS: 2 days    Time spent: 52min    Domenic Polite, MD [facility]  06/19/2018, 11:18 AM

## 2018-06-19 NOTE — Progress Notes (Signed)
  NEUROSURGERY PROGRESS NOTE   No issues overnight. No new complaints  EXAM:  BP 120/64 (BP Location: Right Wrist)   Pulse (!) 55   Temp 98.4 F (36.9 C) (Oral)   Resp 20   Ht 5\' 4"  (1.626 m)   Wt (!) 172.6 kg   LMP 07/11/2014   SpO2 99%   BMI 65.32 kg/m   Awake, alert, oriented  Speech fluent, appropriate  CN grossly intact  0/5 BLE  IMPRESSION:  58 y.o. female 7 weeks paraplegic with low thoracic sensory level likely related to severe stenosis at T10-11  PLAN: - Proceed with decompressive laminectomy T10-11  I had a lengthy discussion with the patient about surgery yesterday. All her questions were answered this am. She is ready to proceed with surgery.

## 2018-06-19 NOTE — Op Note (Signed)
  NEUROSURGERY OPERATIVE NOTE   PREOP DIAGNOSIS:  1. Thoracic stenosis, T10-11 2. T11 Paraplegia  POSTOP DIAGNOSIS: Same  PROCEDURE: 1. T10-T11 decompressive laminectomy  SURGEON: Dr. Consuella Lose, MD  ASSISTANT: Ferne Reus, PA-C  ANESTHESIA: General Endotracheal  EBL: 300cc  SPECIMENS: None  DRAINS: None  COMPLICATIONS: None immediate  CONDITION: Stable to PCAU  HISTORY: Ellen [name] is a 58 y.o. female who initially presented to the emergency department complaining of worsening low back pain.  She also reported essentially inability to walk or move her legs over the last 7 weeks.  She was also complaining of decreased subjective sensation from approximately the groin level down.  She had previously undergone multiple MRI scans of the entire spine, and was apparently treated nonoperatively at [facility] hospital.  Review of her previous MRIs did demonstrate significant stenosis at the T10-T11 level coinciding with her sensory level.  Surgical decompression was therefore indicated.  The risks and benefits of the surgery were reviewed in detail with the patient.  After all her questions were answered, informed consent was obtained and witnessed.  PROCEDURE IN DETAIL: The patient was brought to the operating room via stretcher. After induction of general anesthesia, the patient was positioned on the operative table in the prone position with all pressure points meticulously padded. The skin of the back was then prepped and draped in the usual sterile fashion.  After timeout was conducted, spinal needle was introduced in a paramedian location to identify the surface projection of the T10-11 interspace using AP fluoroscopic image.  An incision was then marked out and infiltrated with local anesthetic with epinephrine.  Skin incision was then made sharply and Bovie electrocautery was used to dissect the subcutaneous tissue until the thoracodorsal fascia was  identified. The fascia was then incised using Bovie electrocautery and the lamina at the T10 and T11 levels was identified and dissection was carried out in the subperiosteal plane. Self-retaining retractor was then placed, and intraoperative fluoroscopic image was taken confirming our location at the T10-11 interspace.  Using a high-speed drill, laminectomies were completed at T10 and T11. The ligamentum flavum was then identified and removed.  There did appear to be a significant amount of compression from the facet joints at T10-11.  Decompression of the thecal sac was then extended laterally to remove the medial portion of the facet joints bilaterally using Kerrison punches.  The decompression was confirmed using a small ball tip dissector.  Hemostasis was then secured using a combination of morcellized Gelfoam and thrombin and bipolar electrocautery. The wound was irrigated with copious amounts of antibiotic saline irrigation.Self-retaining retractor was then removed, and the wound was closed in layers using a combination of interrupted 0 Vicryl and 3-0 Vicryl stitches. The skin was closed using standard skin glue.  At the end of the case all sponge, needle, and instrument counts were correct. The patient was then transferred to the stretcher and taken to the postanesthesia care unit in stable hemodynamic condition.

## 2018-06-20 ENCOUNTER — Encounter (HOSPITAL_COMMUNITY): Payer: Self-pay | Admitting: Neurosurgery

## 2018-06-20 LAB — BASIC METABOLIC PANEL
Anion gap: 9 (ref 5–15)
BUN: 10 mg/dL (ref 6–20)
CO2: 26 mmol/L (ref 22–32)
Calcium: 12.5 mg/dL — ABNORMAL HIGH (ref 8.9–10.3)
Chloride: 104 mmol/L (ref 98–111)
Creatinine, Ser: 0.58 mg/dL (ref 0.44–1.00)
GFR calc Af Amer: >60 mL/min (ref >60)
GFR calc non Af Amer: >60 mL/min (ref >60)
Glucose, Bld: 143 mg/dL — ABNORMAL HIGH (ref 70–99)
Potassium: 4 mmol/L (ref 3.5–5.1)
Sodium: 139 mmol/L (ref 135–145)

## 2018-06-20 LAB — CBC
HCT: 32.2 % — ABNORMAL LOW (ref 36.0–46.0)
Hemoglobin: 10.9 g/dL — ABNORMAL LOW (ref 12.0–15.0)
MCH: 31.9 pg (ref 26.0–34.0)
MCHC: 33.9 g/dL (ref 30.0–36.0)
MCV: 94.2 fL (ref 80.0–100.0)
Platelets: 246 10*3/uL (ref 150–400)
RBC: 3.42 MIL/uL — ABNORMAL LOW (ref 3.87–5.11)
RDW: 11.7 % (ref 11.5–15.5)
WBC: 13.4 10*3/uL — ABNORMAL HIGH (ref 4.0–10.5)
nRBC: 0 % (ref 0.0–0.2)

## 2018-06-20 LAB — HEMOGLOBIN A1C
Hgb A1c MFr Bld: 5.1 % (ref 4.8–5.6)
Mean Plasma Glucose: 99.67 mg/dL

## 2018-06-20 NOTE — Progress Notes (Addendum)
PROGRESS NOTE    Ellen [name]  JQB:[id number] DOB: [birth year] DOA: 06/17/2018 PCP: Deborah Chalk, FNP  Brief Narrative: 58 year old morbidly obese female with history of OSA on CPAP, hypertension, dyslipidemia, started having low back pain, tingling numbness in both lower extremities and gait disorder in January/February following a mechanical fall after this she was still able to perform some ADLs with difficulty, unfortunately suffered another fall in mid March and since then has been unable to move both lower extremities she was admitted to [facility] regional hospital and was subsequently referred for physical therapy. -Presented to the ED yesterday due to worsening low back pain and weakness endorses numbness from umbilicus down. -History of urinary incontinence -Neurosurgery consulted due to paraplegia at T8 11-T12 level with diffuse multilevel stenosis, neurosurgery consulted, plan for surgical decompression/laminectomy given chronicity of her symptoms unlikely she will regain significant strength however any recovery is impossible without surgery  Assessment & Plan:   Severe multilevel spinal stenosis with paraplegia  -Has almost complete loss of strength and sensations in both lower extremity, bladder incontinence -Greatly appreciate neurosurgical input -Underwent decompressive laminectomy of T10-T11 on 4/30  -Continue Lyrica, tramadol, caution with oversedation -Due to chronicity of symptoms less chances of significant recovery -will start PT OT today, patient reports some sensations in her lower legs, no other changes -SCDs for prophylaxis -Labs in a.m.  Hypercalcemia -corrected Ca for albumin is not that high at 11.2 -PTH being normal suggests possibility of primary hyperparathyroidism -will advise FU with endocrine  Hyperglycemia -check hba1c  Hypomagnesemia -Replaced  Anemia -Follow-up anemia panel, monitor  Hypertension -Continue amlodipine and enalapril   Depression -Continue Lexapro  OSA -Continue CPAP nightly  Morbid obesity -BMI of 63 -Advised lifestyle modification, caution with oversedation  DVT prophylaxis: Heparin subcutaneous Code Status: Full code  family Communication:no Family at bedside, left msg for Kathaleen Grinder Disposition Plan: May likely need rehab  Consultants:   Neurosurgery   Procedures:   Antimicrobials:    Subjective: -c/o some back pain, reports some sensations in legs, no change in strength  Objective: Vitals:   06/19/18 2338 06/20/18 0012 06/20/18 0456 06/20/18 0803  BP:  (!) 147/85 (!) 153/81 (!) 152/74  Pulse: 98 99 99 94  Resp: 20 19 18 18   Temp:  98.4 F (36.9 C) 99.4 F (37.4 C) 99.4 F (37.4 C)  TempSrc:  Oral Oral Oral  SpO2: 98% 96% 96% 96%  Weight:      Height:        Intake/Output Summary (Last 24 hours) at 06/20/2018 1140 Last data filed at 06/20/2018 0459 Gross per 24 hour  Intake 345.13 ml  Output 1500 ml  Net -1154.87 ml   Filed Weights   06/18/18 0002 06/19/18 0500 06/19/18 0806  Weight: (!) 167.8 kg (!) 172.6 kg (!) 172.6 kg    Examination: Gen: Morbidly obese chronically ill-appearing female, AAO x3, no distress HEENT: PERRLA, Neck supple, no JVD Lungs: Distant breath sounds CVS: RRR,No Gallops,Rubs or new Murmurs Abd: soft, Non tender, non distended, BS present Extremities: No edema Skin: no new rashes Central nervous system:  Paraplegia, sensation improvement noted in both lower extremities, motor strength is unchanged   Data Reviewed:   CBC: Recent Labs  Lab 06/17/18 2032 06/18/18 0444 06/19/18 0416 06/20/18 0328  WBC 6.8 6.5 5.9 13.4*  NEUTROABS 3.3  --   --   --   HGB 11.6* 11.3* 11.6* 10.9*  HCT 35.6* 34.5*  34.9 36.0 32.2*  MCV 97.8 97.5  97.3 94.2  PLT 298 284 291 759   Basic Metabolic Panel: Recent Labs  Lab 06/17/18 2032 06/18/18 0444 06/19/18 0416 06/20/18 0544  NA 141 142 141 139  K 3.6 3.6 4.2 4.0  CL 107 108 107 104  CO2  29 29 31 26   GLUCOSE 108* 107* 102* 143*  BUN 13 12 9 10   CREATININE 0.66 0.59 0.59 0.58  CALCIUM 12.0* 11.9* 12.2* 12.5*  MG  --  1.6*  --   --   PHOS  --  2.8  --   --    GFR: Estimated Creatinine Clearance: 121.8 mL/min (by C-G formula based on SCr of 0.58 mg/dL). Liver Function Tests: Recent Labs  Lab 06/18/18 0444 06/19/18 0416  AST 13* 14*  ALT 12 12  ALKPHOS 95 95  BILITOT 0.6 0.7  PROT 5.9* 5.8*  ALBUMIN 2.8* 2.7*   No results for input(s): LIPASE, AMYLASE in the last 168 hours. No results for input(s): AMMONIA in the last 168 hours. Coagulation Profile: Recent Labs  Lab 06/18/18 0444  INR 1.2   Cardiac Enzymes: No results for input(s): CKTOTAL, CKMB, CKMBINDEX, TROPONINI in the last 168 hours. BNP (last 3 results) No results for input(s): PROBNP in the last 8760 hours. HbA1C: No results for input(s): HGBA1C in the last 72 hours. CBG: No results for input(s): GLUCAP in the last 168 hours. Lipid Profile: No results for input(s): CHOL, HDL, LDLCALC, TRIG, CHOLHDL, LDLDIRECT in the last 72 hours. Thyroid Function Tests: No results for input(s): TSH, T4TOTAL, FREET4, T3FREE, THYROIDAB in the last 72 hours. Anemia Panel: Recent Labs    06/18/18 0444  VITAMINB12 391  FERRITIN 32  TIBC 295  IRON 60   Urine analysis:    Component Value Date/Time   COLORURINE YELLOW 06/18/2018 0524   APPEARANCEUR HAZY (A) 06/18/2018 0524   LABSPEC 1.016 06/18/2018 0524   PHURINE 6.0 06/18/2018 0524   GLUCOSEU NEGATIVE 06/18/2018 0524   GLUCOSEU NEG mg/dL 08/05/2006 2022   HGBUR NEGATIVE 06/18/2018 0524   BILIRUBINUR NEGATIVE 06/18/2018 0524   KETONESUR NEGATIVE 06/18/2018 0524   PROTEINUR NEGATIVE 06/18/2018 0524   UROBILINOGEN 1.0 06/14/2011 1828   NITRITE NEGATIVE 06/18/2018 0524   LEUKOCYTESUR NEGATIVE 06/18/2018 0524   Sepsis Labs: @LABRCNTIP (procalcitonin:4,lacticidven:4)  ) Recent Results (from the past 240 hour(s))  MRSA PCR Screening     Status: None    Collection Time: 06/18/18  1:08 AM  Result Value Ref Range Status   MRSA by PCR NEGATIVE NEGATIVE Final    Comment:        The GeneXpert MRSA Assay (FDA approved for NASAL specimens only), is one component of a comprehensive MRSA colonization surveillance program. It is not intended to diagnose MRSA infection nor to guide or monitor treatment for MRSA infections. Performed at [facility], [place] [address] [place], [place] [address]          Radiology Studies: Dg Thoracolumabar Spine  Result Date: 06/19/2018 CLINICAL DATA:  58 year old female undergoing localization for T10-T11 laminectomy EXAM: THORACOLUMBAR SPINE 1V COMPARISON:  Prior chest x-ray 05/30/2018 FINDINGS: Single intraoperative spot radiograph demonstrates a surgical sponge and metallic probe overlying the T11 level. IMPRESSION: Intraoperative localization radiograph. Electronically Signed   By: Jacqulynn Cadet M.D.   On: 06/19/2018 14:02   Dg C-arm 1-60 Min  Result Date: 06/19/2018 CLINICAL DATA:  58 year old female undergoing localization for T10-T11 laminectomy EXAM: THORACOLUMBAR SPINE 1V COMPARISON:  Prior chest x-ray 05/30/2018 FINDINGS: Single intraoperative spot radiograph demonstrates a surgical  sponge and metallic probe overlying the T11 level. IMPRESSION: Intraoperative localization radiograph. Electronically Signed   By: Jacqulynn Cadet M.D.   On: 06/19/2018 14:02        Scheduled Meds: . acetaminophen  1,000 mg Oral Q6H  . amLODipine  5 mg Oral Daily  . atorvastatin  20 mg Oral q1800  . docusate sodium  100 mg Oral BID  . enalapril  20 mg Oral Daily  . escitalopram  10 mg Oral Daily  . oxyCODONE-acetaminophen  1 tablet Oral QHS  . pantoprazole  40 mg Oral Daily  . pregabalin  75 mg Oral BID  . senna  1 tablet Oral BID  . sodium chloride flush  3 mL Intravenous Q12H  . sodium chloride flush  3 mL Intravenous Q12H  . vitamin B-12  1,000 mcg Oral Daily  . [START ON 06/23/2018] Vitamin D  (Ergocalciferol)  50,000 Units Oral Q Mon   Continuous Infusions: . sodium chloride    . sodium chloride 10 mL/hr at 06/20/18 0400  . sodium chloride    . lactated ringers 10 mL/hr at 06/19/18 0825  . methocarbamol (ROBAXIN) IV       LOS: 3 days    Time spent: 10min    Domenic Polite, MD [facility]  06/20/2018, 11:40 AM

## 2018-06-20 NOTE — Progress Notes (Signed)
Occupational Therapy Evaluation Patient Details Name: Ellen [name] MRN: [id number] DOB: 1960/08/08 Today's Date: 06/20/2018    History of Present Illness 58 y/o female admitted secondary to worsening back pain and paraplegia. Pt is s/p T10-11 decompressive laminectomy. PMH includes HTN and obesity.    Clinical Impression   Pt should be HOYER LIFT only at this time. Recommending air mattress overlay to prevent skin break down due to prolonged positioning and body habitus. Pt currently total +3 total (A) for all bed mobility. Pt unable to sustain EOB without total (A). Recommending SNF for d/c recommendations. Pt does not show any activation in LE this session.     Follow Up Recommendations  SNF;Supervision/Assistance - 24 hour    Equipment Recommendations  Wheelchair cushion (measurements OT);Wheelchair (measurements OT);Hospital bed(HOYER bariatric for all with air mattress overlay)    Recommendations for Other Services Speech consult     Precautions / Restrictions Precautions Precautions: Back Precaution Booklet Issued: Yes (comment) Precaution Comments: Reviewed back precautions and asked pt to repeat back, however, pt unable. Will need consistent review.  Restrictions Weight Bearing Restrictions: No      Mobility Bed Mobility Overal bed mobility: Needs Assistance Bed Mobility: Supine to Sit;Sit to Supine     Supine to sit: Total assist(+3) Sit to supine: Total assist(+3)   General bed mobility comments: Total A +3 to perform bed mobility tasks. RN in room to help with mobility. Total A to maintain sitting balance and pt with tendency to lean toward the R. Pt reporting dizziness, however BP WFL. Total A +3 to return to supine.   Transfers                 General transfer comment: HOYER LIFT ONLY!!!    Balance Overall balance assessment: Needs assistance Sitting-balance support: Bilateral upper extremity supported;Feet supported Sitting balance-Leahy Scale:  Zero Sitting balance - Comments: Pt with tendency to lean to the R and required total A to maintain sitting balance.                                    ADL either performed or assessed with clinical judgement   ADL Overall ADL's : Needs assistance/impaired Eating/Feeding: Maximal assistance   Grooming: Modified independent   Upper Body Bathing: Maximal assistance   Lower Body Bathing: Total assistance                         General ADL Comments: pt requires total +3 to come to EOB and pt with decr level of attention throughout session. pt on phone on arrival and repeating statements to person ont he phone but sustained attention to task.      Vision         Perception     Praxis      Pertinent Vitals/Pain Pain Assessment: 0-10 Pain Score: 10-Worst pain ever Pain Location: back and stomach Pain Descriptors / Indicators: Operative site guarding;Sharp Pain Intervention(s): Monitored during session;Premedicated before session;Repositioned     Hand Dominance Right   Extremity/Trunk Assessment Upper Extremity Assessment Upper Extremity Assessment: Generalized weakness   Lower Extremity Assessment Lower Extremity Assessment: Defer to PT evaluation RLE Deficits / Details: Sensation in tact. Unable to actively move LEs. Asked pt to move toes on the R and pt reporting she can't. However, when adjusting sock, noted active movement in big toe.  LLE Deficits / Details: Sensation  in tact. Unable to actively move LEs. Asked pt to move toes on the R and pt reporting she can't.   Cervical / Trunk Assessment Cervical / Trunk Assessment: Other exceptions Cervical / Trunk Exceptions: s/p thoracic surgery    Communication Communication Communication: No difficulties   Cognition Arousal/Alertness: Awake/alert Behavior During Therapy: WFL for tasks assessed/performed Overall Cognitive Status: Impaired/Different from baseline Area of Impairment: Following  commands;Awareness;Problem solving;Memory                     Memory: Decreased short-term memory;Decreased recall of precautions Following Commands: Follows one step commands with increased time   Awareness: Emergent Problem Solving: Slow processing;Requires verbal cues;Requires tactile cues;Decreased initiation General Comments: Pt slow to respond to questions and unable to remember precautions without cues. Slow processing noted and required multimodal cues to perform mobility tasks. pt with decr sustain attention    General Comments  recommend air mattress overlay to decr risk for skin break down. pt high risk for skin break down at skin folds as well    Exercises     Shoulder Instructions      Home Living Family/patient expects to be discharged to:: Skilled nursing facility                                        Prior Functioning/Environment Level of Independence: Needs assistance  Gait / Transfers Assistance Needed: Pt unable to report mobility performed at SNF. Do not anticipate pt was moving much, as she is unable to actively move LEs.  ADL's / Homemaking Assistance Needed: Assume pt required total A for ADLs given mobility limitations. Pt unable to report.  Communication / Swallowing Assistance Needed: pt repeating statements with long pause           OT Problem List: Decreased strength;Decreased range of motion;Decreased activity tolerance;Impaired balance (sitting and/or standing);Impaired vision/perception;Decreased coordination;Decreased cognition;Decreased safety awareness;Decreased knowledge of use of DME or AE;Decreased knowledge of precautions;Cardiopulmonary status limiting activity;Impaired sensation;Obesity;Pain      OT Treatment/Interventions: Self-care/ADL training;Therapeutic exercise;Neuromuscular education;Energy conservation;DME and/or AE instruction;Manual therapy;Modalities;Therapeutic activities;Cognitive  remediation/compensation;Visual/perceptual remediation/compensation;Patient/family education;Balance training    OT Goals(Current goals can be found in the care plan section) Acute Rehab OT Goals Patient Stated Goal: to decrease pain OT Goal Formulation: Patient unable to participate in goal setting Time For Goal Achievement: 07/04/18 Potential to Achieve Goals: Fair  OT Frequency: Min 2X/week   Barriers to D/C: Decreased caregiver support          Co-evaluation PT/OT/SLP Co-Evaluation/Treatment: Yes Reason for Co-Treatment: Necessary to address cognition/behavior during functional activity;Complexity of the patient's impairments (multi-system involvement);For patient/therapist safety;To address functional/ADL transfers PT goals addressed during session: Mobility/safety with mobility;Balance OT goals addressed during session: ADL's and self-care;Proper use of Adaptive equipment and DME;Strengthening/ROM      AM-PAC OT "6 Clicks" Daily Activity     Outcome Measure Help from another person eating meals?: A Lot Help from another person taking care of personal grooming?: A Lot Help from another person toileting, which includes using toliet, bedpan, or urinal?: Total Help from another person bathing (including washing, rinsing, drying)?: Total Help from another person to put on and taking off regular upper body clothing?: A Lot Help from another person to put on and taking off regular lower body clothing?: Total 6 Click Score: 9   End of Session Nurse Communication: Mobility status;Need for lift equipment;Precautions;Weight bearing status  Activity Tolerance: Patient tolerated treatment well Patient left: in bed;with call bell/phone within reach;with nursing/sitter in room(SCD wrap present but no pump- RN made aware)  OT Visit Diagnosis: Unsteadiness on feet (R26.81);Muscle weakness (generalized) (M62.81);Pain Pain - part of body: (stomach)                Time: 3888-7579 OT Time  Calculation (min): 21 min Charges:  OT General Charges $OT Visit: 1 Visit OT Evaluation $OT Eval High Complexity: 1 High   Jeri Modena, OTR/L  Acute Rehabilitation Services Pager: [phone] Office: [phone] .   Jeri Modena 06/20/2018, 1:58 PM

## 2018-06-20 NOTE — Evaluation (Signed)
Physical Therapy Evaluation Patient Details Name: Ellen [name] MRN: [id number] DOB: [birth year] Today's Date: 06/20/2018   History of Present Illness  Pt is a 58 y/o female admitted secondary to worsening back pain and paraplegia. Pt is s/p T10-11 decompressive laminectomy. PMH includes HTN and obesity.   Clinical Impression  Pt s/p surgery above with deficits below. Pt requiring total A +3 to perform bed mobility tasks and required total A to maintain sitting balance. Pt with limited movement in BLEs, however, reports sensation in tact. Pt also presenting with cognitive deficits, however, unsure of baseline cognition. Feel pt will benefit from return to SNF. Will continue to follow acutely to maximize functional mobility independence and safety.     Follow Up Recommendations SNF;Supervision/Assistance - 24 hour    Equipment Recommendations  None recommended by PT    Recommendations for Other Services       Precautions / Restrictions Precautions Precautions: Back Precaution Booklet Issued: Yes (comment) Precaution Comments: Reviewed back precautions and asked pt to repeat back, however, pt unable. Will need consistent review.  Restrictions Weight Bearing Restrictions: No      Mobility  Bed Mobility Overal bed mobility: Needs Assistance Bed Mobility: Supine to Sit;Sit to Supine     Supine to sit: Total assist(+3) Sit to supine: Total assist(+3)   General bed mobility comments: Total A +3 to perform bed mobility tasks. RN in room to help with mobility. Total A to maintain sitting balance and pt with tendency to lean toward the R. Pt reporting dizziness, however BP WFL. Total A +3 to return to supine.   Transfers                    Ambulation/Gait                Stairs            Wheelchair Mobility    Modified Rankin (Stroke Patients Only)       Balance Overall balance assessment: Needs assistance Sitting-balance support: Bilateral upper  extremity supported;Feet supported Sitting balance-Leahy Scale: Zero Sitting balance - Comments: Pt with tendency to lean to the R and required total A to maintain sitting balance.                                      Pertinent Vitals/Pain Pain Assessment: 0-10 Pain Score: 10-Worst pain ever Pain Location: back and stomach Pain Descriptors / Indicators: Operative site guarding;Sharp Pain Intervention(s): Limited activity within patient's tolerance;Monitored during session;RN gave pain meds during session;Repositioned    Home Living Family/patient expects to be discharged to:: Skilled nursing facility                      Prior Function Level of Independence: Needs assistance   Gait / Transfers Assistance Needed: Pt unable to report mobility performed at SNF. Do not anticipate pt was moving much, as she is unable to actively move LEs.   ADL's / Homemaking Assistance Needed: Assume pt required total A for ADLs given mobility limitations. Pt unable to report.         Hand Dominance        Extremity/Trunk Assessment   Upper Extremity Assessment Upper Extremity Assessment: Defer to OT evaluation    Lower Extremity Assessment Lower Extremity Assessment: RLE deficits/detail;LLE deficits/detail RLE Deficits / Details: Sensation in tact. Unable to actively move LEs. Asked pt  to move toes on the R and pt reporting she can't. However, when adjusting sock, noted active movement in big toe.  LLE Deficits / Details: Sensation in tact. Unable to actively move LEs. Asked pt to move toes on the R and pt reporting she can't.    Cervical / Trunk Assessment Cervical / Trunk Assessment: Other exceptions Cervical / Trunk Exceptions: s/p thoracic surgery   Communication   Communication: No difficulties  Cognition Arousal/Alertness: Awake/alert Behavior During Therapy: WFL for tasks assessed/performed Overall Cognitive Status: Impaired/Different from baseline Area of  Impairment: Following commands;Awareness;Problem solving;Memory                     Memory: Decreased short-term memory;Decreased recall of precautions Following Commands: Follows one step commands with increased time   Awareness: Emergent Problem Solving: Slow processing;Requires verbal cues;Requires tactile cues;Decreased initiation General Comments: Pt slow to respond to questions and unable to remember precautions without cues. Slow processing noted and required multimodal cues to perform mobility tasks.       General Comments      Exercises     Assessment/Plan    PT Assessment Patient needs continued PT services  PT Problem List Decreased strength;Decreased balance;Decreased activity tolerance;Decreased mobility;Decreased cognition;Decreased knowledge of use of DME;Decreased knowledge of precautions;Decreased safety awareness;Pain       PT Treatment Interventions DME instruction;Functional mobility training;Therapeutic activities;Therapeutic exercise;Gait training;Balance training;Patient/family education;Cognitive remediation;Neuromuscular re-education    PT Goals (Current goals can be found in the Care Plan section)  Acute Rehab PT Goals Patient Stated Goal: to decrease pain PT Goal Formulation: With patient Time For Goal Achievement: 07/04/18 Potential to Achieve Goals: Fair    Frequency Min 3X/week   Barriers to discharge        Co-evaluation PT/OT/SLP Co-Evaluation/Treatment: Yes Reason for Co-Treatment: Complexity of the patient's impairments (multi-system involvement);Necessary to address cognition/behavior during functional activity;For patient/therapist safety;To address functional/ADL transfers PT goals addressed during session: Mobility/safety with mobility;Balance         AM-PAC PT "6 Clicks" Mobility  Outcome Measure Help needed turning from your back to your side while in a flat bed without using bedrails?: Total Help needed moving from  lying on your back to sitting on the side of a flat bed without using bedrails?: Total Help needed moving to and from a bed to a chair (including a wheelchair)?: Total Help needed standing up from a chair using your arms (e.g., wheelchair or bedside chair)?: Total Help needed to walk in hospital room?: Total Help needed climbing 3-5 steps with a railing? : Total 6 Click Score: 6    End of Session   Activity Tolerance: Patient limited by pain;Other (comment)(dizziness) Patient left: in bed;with call bell/phone within reach;with bed alarm set Nurse Communication: Mobility status PT Visit Diagnosis: Muscle weakness (generalized) (M62.81);Difficulty in walking, not elsewhere classified (R26.2);Pain;Unsteadiness on feet (R26.81) Pain - part of body: (back)    Time: 1030-1056 PT Time Calculation (min) (ACUTE ONLY): 26 min   Charges:   PT Evaluation $PT Eval Moderate Complexity: [place], PT, DPT  Acute Rehabilitation Services  Pager: [phone] Office: [phone]   [name] 06/20/2018, 12:58 PM

## 2018-06-20 NOTE — Progress Notes (Signed)
  NEUROSURGERY PROGRESS NOTE   No issues overnight Complains of appropriate back soreness No change in pre op N/T  EXAM:  BP (!) 153/81 (BP Location: Right Wrist)   Pulse 99   Temp 99.4 F (37.4 C) (Oral)   Resp 18   Ht 5\' 4"  (1.626 m)   Wt (!) 172.6 kg   LMP 07/11/2014   SpO2 96%   BMI 65.32 kg/m   Awake, alert, oriented  Speech fluent, appropriate  CN grossly intact  5/5 strength BUE Able to flicker toes, but otherwise 0/5 BLE  IMPRESSION/PLAN 58 y.o. female pod#1 s/p thoracic laminectomy. No significant change neurologically.  - PT/OT today - Continue supportive care - Continue SCD for VTE prophylaxis  Ferne Reus, PA-C [facility]

## 2018-06-21 LAB — COMPREHENSIVE METABOLIC PANEL
ALT: 15 U/L (ref 0–44)
AST: 18 U/L (ref 15–41)
Albumin: 2.9 g/dL — ABNORMAL LOW (ref 3.5–5.0)
Alkaline Phosphatase: 94 U/L (ref 38–126)
Anion gap: 11 (ref 5–15)
BUN: 10 mg/dL (ref 6–20)
CO2: 26 mmol/L (ref 22–32)
Calcium: 12.6 mg/dL — ABNORMAL HIGH (ref 8.9–10.3)
Chloride: 102 mmol/L (ref 98–111)
Creatinine, Ser: 0.59 mg/dL (ref 0.44–1.00)
GFR calc Af Amer: >60 mL/min (ref >60)
GFR calc non Af Amer: >60 mL/min (ref >60)
Glucose, Bld: 107 mg/dL — ABNORMAL HIGH (ref 70–99)
Potassium: 4.1 mmol/L (ref 3.5–5.1)
Sodium: 139 mmol/L (ref 135–145)
Total Bilirubin: 0.8 mg/dL (ref 0.3–1.2)
Total Protein: 6.5 g/dL (ref 6.5–8.1)

## 2018-06-21 LAB — CBC
HCT: 36 % (ref 36.0–46.0)
Hemoglobin: 11.7 g/dL — ABNORMAL LOW (ref 12.0–15.0)
MCH: 31.7 pg (ref 26.0–34.0)
MCHC: 32.5 g/dL (ref 30.0–36.0)
MCV: 97.6 fL (ref 80.0–100.0)
Platelets: 246 10*3/uL (ref 150–400)
RBC: 3.69 MIL/uL — ABNORMAL LOW (ref 3.87–5.11)
RDW: 11.9 % (ref 11.5–15.5)
WBC: 11.2 10*3/uL — ABNORMAL HIGH (ref 4.0–10.5)
nRBC: 0 % (ref 0.0–0.2)

## 2018-06-21 NOTE — Progress Notes (Signed)
Patient not ready for CPAP at this time, will recheck at around 11 or 12. Per her request

## 2018-06-21 NOTE — Progress Notes (Signed)
Patient placed on CPAp and doing well. No issues at this time

## 2018-06-21 NOTE — NC FL2 (Signed)
[place] LEVEL OF CARE SCREENING TOOL     IDENTIFICATION  Patient Name: Ellen [name] Birthdate: 1960-03-01 Sex: female Admission Date (Current Location): 06/17/2018  [facility] and [place] Number:  Herbalist and Address:  [facility] [place], [place] [address] [place], [place] [address]      Provider Number: [id number]  Attending Physician Name and Address:  Domenic Polite, MD  Relative Name and Phone Number:       Current Level of Care: Hospital Recommended Level of Care: [place] Prior Approval Number:    Date Approved/Denied:   PASRR Number:    Discharge Plan: SNF    Current Diagnoses: Patient Active Problem List   Diagnosis Date Noted  . Back pain 06/17/2018  . OSA (obstructive sleep apnea) 06/17/2018  . Hypercalcemia 06/17/2018  . Anxiety 04/06/2017  . Acute respiratory failure with hypoxia ([place])   . Acute hypoxemic respiratory failure ([place]) 04/05/2017  . SIRS (systemic inflammatory response syndrome) ([place]) 04/05/2017  . Elevated troponin 04/05/2017  . Hyponatremia 04/05/2017  . Urgency of micturation 04/14/2015  . Dizziness 04/14/2015  . Neck pain 03/31/2015  . Abnormal uterine bleeding 08/29/2012  . Incarcerated ventral hernia 09/11/2010  . COUGH 05/13/2009  . ALLERGIC RHINITIS, SEASONAL 05/26/2008  . SHOULDER PAIN, LEFT 08/11/2007  . PRURITUS, VAGINAL 09/30/2006  . BACK PAIN 08/05/2006  . 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 LIPOMA 01/08/2006  . Hyperlipidemia 01/08/2006  . Obesity, morbid, BMI 50 or higher ([place]) 01/08/2006  . DEPRESSION 01/08/2006  . Essential hypertension 01/08/2006  . BRONCHITIS NOS 01/08/2006  . GERD 01/08/2006  . HERNIA, VENTRAL 01/08/2006  . ENDOMETRIAL POLYP 01/08/2006  . DEGENERATIVE JOINT DISEASE 01/08/2006    Orientation RESPIRATION BLADDER Height & Weight     Self, Situation, Place  Normal Incontinent Weight: (!) 380 lb 8.2 oz (172.6 kg) Height:  5\' 4"   (162.6 cm)  BEHAVIORAL SYMPTOMS/MOOD NEUROLOGICAL BOWEL NUTRITION STATUS      Incontinent Diet(see DC Summary)  AMBULATORY STATUS COMMUNICATION OF NEEDS Skin   Extensive Assist Verbally PU Stage and Appropriate Care, Surgical wounds(closed back wound, honeycomb dressing) PU Stage 1 Dressing: No Dressing(sacrum)                     Personal Care Assistance Level of Assistance  Feeding, Dressing, Bathing Bathing Assistance: Maximum assistance Feeding assistance: Independent Dressing Assistance: Maximum assistance     Functional Limitations Info  Sight, Hearing, Speech Sight Info: Adequate Hearing Info: Adequate Speech Info: Impaired(delayed responses)    SPECIAL CARE FACTORS FREQUENCY  PT (By licensed PT), OT (By licensed OT)     PT Frequency: 5x/wk OT Frequency: 5x/wk            Contractures Contractures Info: Not present    Additional Factors Info  Code Status, Allergies, Psychotropic Code Status Info: Full Allergies Info: Codeine Psychotropic Info: Lexapro 10mg  daily         Current Medications (06/21/2018):  This is the current hospital active medication list Current Facility-Administered Medications  Medication Dose Route Frequency Provider Last Rate Last Dose  . 0.9 %  sodium chloride infusion  250 mL Intravenous PRN Jani Gravel, MD      . 0.9 %  sodium chloride infusion   Intravenous Continuous Costella, Vista Mink, PA-C 10 mL/hr at 06/20/18 0400    . 0.9 %  sodium chloride infusion  250 mL Intravenous Continuous Costella, Vista Mink, PA-C      .  acetaminophen (TYLENOL) tablet 650 mg  650 mg Oral Q4H PRN Costella, Vista Mink, PA-C       Or  . acetaminophen (TYLENOL) suppository 650 mg  650 mg Rectal Q4H PRN Costella, Vista Mink, PA-C      . amLODipine (NORVASC) tablet 5 mg  5 mg Oral Daily Jani Gravel, MD   5 mg at 06/20/18 1106  . atorvastatin (LIPITOR) tablet 20 mg  20 mg Oral q1800 Jani Gravel, MD   20 mg at 06/20/18 1806  . bisacodyl (DULCOLAX) suppository 10  mg  10 mg Rectal Daily PRN Costella, Vista Mink, PA-C      . diclofenac sodium (VOLTAREN) 1 % transdermal gel 2-4 g  2-4 g Topical QID PRN Jani Gravel, MD   4 g at 06/18/18 1023  . docusate sodium (COLACE) capsule 100 mg  100 mg Oral BID Traci Sermon, PA-C   100 mg at 06/20/18 2044  . enalapril (VASOTEC) tablet 20 mg  20 mg Oral Daily Jani Gravel, MD   20 mg at 06/20/18 1105  . escitalopram (LEXAPRO) tablet 10 mg  10 mg Oral Daily Jani Gravel, MD   10 mg at 06/20/18 1106  . HYDROcodone-acetaminophen (NORCO/VICODIN) 5-325 MG per tablet 1 tablet  1 tablet Oral Q4H PRN Traci Sermon, PA-C   1 tablet at 06/20/18 1806  . HYDROmorphone (DILAUDID) injection 0.5-1 mg  0.5-1 mg Intravenous Q2H PRN Costella, Vincent J, PA-C   0.5 mg at 06/21/18 0830  . lactated ringers infusion   Intravenous Continuous Ellender, Karyl Kinnier, MD 10 mL/hr at 06/19/18 0825    . menthol-cetylpyridinium (CEPACOL) lozenge 3 mg  1 lozenge Oral PRN Costella, Vista Mink, PA-C       Or  . phenol (CHLORASEPTIC) mouth spray 1 spray  1 spray Mouth/Throat PRN Costella, Vista Mink, PA-C      . methocarbamol (ROBAXIN) tablet 500 mg  500 mg Oral Q6H PRN Costella, Vincent J, PA-C   500 mg at 06/19/18 1149   Or  . methocarbamol (ROBAXIN) 500 mg in dextrose 5 % 50 mL IVPB  500 mg Intravenous Q6H PRN Costella, Vincent J, PA-C      . ondansetron (ZOFRAN) tablet 4 mg  4 mg Oral Q6H PRN Costella, Vincent J, PA-C       Or  . ondansetron (ZOFRAN) injection 4 mg  4 mg Intravenous Q6H PRN Costella, Vincent J, PA-C   4 mg at 06/20/18 2048  . oxyCODONE (Oxy IR/ROXICODONE) immediate release tablet 5-10 mg  5-10 mg Oral Q3H PRN Costella, Vincent J, PA-C   5 mg at 06/19/18 1150  . oxyCODONE-acetaminophen (PERCOCET/ROXICET) 5-325 MG per tablet 1 tablet  1 tablet Oral QHS Jani Gravel, MD   1 tablet at 06/20/18 2045  . pantoprazole (PROTONIX) EC tablet 40 mg  40 mg Oral Daily Jani Gravel, MD   40 mg at 06/20/18 1105  . polyethylene glycol (MIRALAX /  GLYCOLAX) packet 17 g  17 g Oral Daily PRN Jani Gravel, MD   17 g at 06/18/18 1020  . pregabalin (LYRICA) capsule 75 mg  75 mg Oral BID Domenic Polite, MD   75 mg at 06/20/18 2045  . senna (SENOKOT) tablet 8.6 mg  1 tablet Oral BID Traci Sermon, PA-C   8.6 mg at 06/20/18 2044  . senna-docusate (Senokot-S) tablet 1 tablet  1 tablet Oral QHS PRN Costella, Vincent J, PA-C      . sodium chloride flush (NS) 0.9 % injection 3 mL  3 mL Intravenous Q12H Jani Gravel, MD   3 mL at 06/20/18 2238  . sodium chloride flush (NS) 0.9 % injection 3 mL  3 mL Intravenous PRN Jani Gravel, MD      . sodium chloride flush (NS) 0.9 % injection 3 mL  3 mL Intravenous Q12H Costella, Vincent J, PA-C      . sodium chloride flush (NS) 0.9 % injection 3 mL  3 mL Intravenous PRN Costella, Vincent J, PA-C      . sodium phosphate (FLEET) 7-19 GM/118ML enema 1 enema  1 enema Rectal Once PRN Costella, Vista Mink, PA-C      . traMADol (ULTRAM) tablet 50 mg  50 mg Oral Q6H PRN Jani Gravel, MD   50 mg at 06/18/18 1511  . vitamin B-12 (CYANOCOBALAMIN) tablet 1,000 mcg  1,000 mcg Oral Daily Jani Gravel, MD   1,000 mcg at 06/20/18 1106  . [START ON 06/23/2018] Vitamin D (Ergocalciferol) (DRISDOL) capsule 50,000 Units  50,000 Units Oral Q Harl Favor, MD         Discharge Medications: Please see discharge summary for a list of discharge medications.  Relevant Imaging Results:  Relevant Lab Results:   Additional Information SS#: [id number]  Geralynn Ochs, LCSW

## 2018-06-21 NOTE — Progress Notes (Signed)
NEUROSURGERY PROGRESS NOTE  Doing well. Complains of appropriate back soreness. No acute events overnight   Temp:  [97.9 F (36.6 C)-99.1 F (37.3 C)] 98.2 F (36.8 C) (05/02 0835) Pulse Rate:  [77-95] 89 (05/02 0835) Resp:  [16-19] 17 (05/02 0835) BP: (137-160)/(59-109) 153/81 (05/02 0835) SpO2:  [94 %-100 %] 94 % (05/02 0835)  Plan: Status pod #2 from thoracic decompression. No change in neuro status, still 0/5 lower extremities. SNF recommendation made by therapies.   Eleonore Chiquito, NP 06/21/2018 9:12 AM

## 2018-06-21 NOTE — Progress Notes (Signed)
PROGRESS NOTE    Ellen [name]  UYQ:[id number] DOB: March 08, 1960 DOA: 06/17/2018 PCP: Deborah Chalk, FNP  Brief Narrative: 58 year old morbidly obese female with history of OSA on CPAP, hypertension, dyslipidemia, started having low back pain, tingling numbness in both lower extremities and gait disorder in January/February following a mechanical fall after this she was still able to perform some ADLs with difficulty, unfortunately suffered another fall in mid March and since then has been unable to move both lower extremities she was admitted to [facility] regional hospital and was subsequently referred for physical therapy. -Presented to the ED yesterday due to worsening low back pain and weakness endorses numbness from umbilicus down. -History of urinary incontinence -Neurosurgery consulted due to paraplegia at T8 11-T12 level with diffuse multilevel stenosis, neurosurgery consulted, plan for surgical decompression/laminectomy given chronicity of her symptoms unlikely she will regain significant strength however any recovery is impossible without surgery  Assessment & Plan:   Severe multilevel spinal stenosis with paraplegia  -Has almost complete loss of strength and sensations in both lower extremity, bladder incontinence -Greatly appreciate neurosurgical input -Underwent decompressive laminectomy of T10-T11 on 4/30  -Continue Lyrica, tramadol, caution with oversedation -Due to chronicity of symptoms less chances of significant recovery -No significant changes neurologically although she does report improved sensations in both lower legs, continue PT OT  -Will need rehab  -Start Lovenox for DVT prophylaxis tomorrow if okay with neurosurgery  Hypercalcemia -corrected Ca for albumin is not that high at 11.2-11.5 -Patient is asymptomatic -PTH being normal suggests possibility of primary hyperparathyroidism -Follow-up vitamin D level -Will need endocrine follow-up  Hyperglycemia  -Hemoglobin A1c is 5.1  Hypomagnesemia -Replaced  Mild anemia  -Anemia panel was unremarkable  Hypertension -Continue amlodipine and enalapril  Depression -Continue Lexapro  OSA -Continue CPAP nightly  Morbid obesity -BMI of 63 -Advised lifestyle modification, caution with oversedation  DVT prophylaxis: Heparin subcutaneous Code Status: Full code  family Communication:no Family at bedside, left msg for Kathaleen Grinder Disposition Plan: SNF for rehab early next week  Consultants:   Neurosurgery   Procedures:   Antimicrobials:    Subjective: -Complains of some soreness in her back, no improvement in leg strength  Objective: Vitals:   06/21/18 0005 06/21/18 0007 06/21/18 0433 06/21/18 0835  BP: (!) 155/87  (!) 157/93 (!) 153/81  Pulse: 95 95 91 89  Resp: 16 19 16 17   Temp: 98.1 F (36.7 C)  98.3 F (36.8 C) 98.2 F (36.8 C)  TempSrc: Oral  Oral Axillary  SpO2: 98% 98% 99% 94%  Weight:      Height:        Intake/Output Summary (Last 24 hours) at 06/21/2018 1125 Last data filed at 06/20/2018 1700 Gross per 24 hour  Intake -  Output 1000 ml  Net -1000 ml   Filed Weights   06/18/18 0002 06/19/18 0500 06/19/18 0806  Weight: (!) 167.8 kg (!) 172.6 kg (!) 172.6 kg    Examination: Gen: Awake, Alert, Oriented X 3, morbidly obese, sitting up in bed, no distress HEENT: PERRLA, Neck supple, no JVD Lungs: Good air movement bilaterally, CTAB CVS: S1-S2/regular rate rhythm Abd: soft, Non tender, non distended, BS present Extremities: No edema Skin: no new rashes Central nervous system: Paraplegic with no strength in both lower extremities, sensations light touch improved    Data Reviewed:   CBC: Recent Labs  Lab 06/17/18 2032 06/18/18 0444 06/19/18 0416 06/20/18 0328 06/21/18 0320  WBC 6.8 6.5 5.9 13.4* 11.2*  NEUTROABS 3.3  --   --   --   --  HGB 11.6* 11.3* 11.6* 10.9* 11.7*  HCT 35.6* 34.5*  34.9 36.0 32.2* 36.0  MCV 97.8 97.5 97.3 94.2 97.6   PLT 298 284 291 246 956   Basic Metabolic Panel: Recent Labs  Lab 06/17/18 2032 06/18/18 0444 06/19/18 0416 06/20/18 0544 06/21/18 0320  NA 141 142 141 139 139  K 3.6 3.6 4.2 4.0 4.1  CL 107 108 107 104 102  CO2 29 29 31 26 26   GLUCOSE 108* 107* 102* 143* 107*  BUN 13 12 9 10 10   CREATININE 0.66 0.59 0.59 0.58 0.59  CALCIUM 12.0* 11.9* 12.2* 12.5* 12.6*  MG  --  1.6*  --   --   --   PHOS  --  2.8  --   --   --    GFR: Estimated Creatinine Clearance: 121.8 mL/min (by C-G formula based on SCr of 0.59 mg/dL). Liver Function Tests: Recent Labs  Lab 06/18/18 0444 06/19/18 0416 06/21/18 0320  AST 13* 14* 18  ALT 12 12 15   ALKPHOS 95 95 94  BILITOT 0.6 0.7 0.8  PROT 5.9* 5.8* 6.5  ALBUMIN 2.8* 2.7* 2.9*   No results for input(s): LIPASE, AMYLASE in the last 168 hours. No results for input(s): AMMONIA in the last 168 hours. Coagulation Profile: Recent Labs  Lab 06/18/18 0444  INR 1.2   Cardiac Enzymes: No results for input(s): CKTOTAL, CKMB, CKMBINDEX, TROPONINI in the last 168 hours. BNP (last 3 results) No results for input(s): PROBNP in the last 8760 hours. HbA1C: Recent Labs    06/20/18 0328  HGBA1C 5.1   CBG: No results for input(s): GLUCAP in the last 168 hours. Lipid Profile: No results for input(s): CHOL, HDL, LDLCALC, TRIG, CHOLHDL, LDLDIRECT in the last 72 hours. Thyroid Function Tests: No results for input(s): TSH, T4TOTAL, FREET4, T3FREE, THYROIDAB in the last 72 hours. Anemia Panel: No results for input(s): VITAMINB12, FOLATE, FERRITIN, TIBC, IRON, RETICCTPCT in the last 72 hours. Urine analysis:    Component Value Date/Time   COLORURINE YELLOW 06/18/2018 0524   APPEARANCEUR HAZY (A) 06/18/2018 0524   LABSPEC 1.016 06/18/2018 0524   PHURINE 6.0 06/18/2018 0524   GLUCOSEU NEGATIVE 06/18/2018 0524   GLUCOSEU NEG mg/dL 08/05/2006 2022   HGBUR NEGATIVE 06/18/2018 0524   BILIRUBINUR NEGATIVE 06/18/2018 0524   KETONESUR NEGATIVE 06/18/2018  0524   PROTEINUR NEGATIVE 06/18/2018 0524   UROBILINOGEN 1.0 06/14/2011 1828   NITRITE NEGATIVE 06/18/2018 0524   LEUKOCYTESUR NEGATIVE 06/18/2018 0524   Sepsis Labs: @LABRCNTIP (procalcitonin:4,lacticidven:4)  ) Recent Results (from the past 240 hour(s))  MRSA PCR Screening     Status: None   Collection Time: 06/18/18  1:08 AM  Result Value Ref Range Status   MRSA by PCR NEGATIVE NEGATIVE Final    Comment:        The GeneXpert MRSA Assay (FDA approved for NASAL specimens only), is one component of a comprehensive MRSA colonization surveillance program. It is not intended to diagnose MRSA infection nor to guide or monitor treatment for MRSA infections. Performed at [facility], [place] [address] [place], [place] [address]          Radiology Studies: No results found.      Scheduled Meds: . amLODipine  5 mg Oral Daily  . atorvastatin  20 mg Oral q1800  . docusate sodium  100 mg Oral BID  . enalapril  20 mg Oral Daily  . escitalopram  10 mg Oral Daily  . oxyCODONE-acetaminophen  1 tablet Oral QHS  .  pantoprazole  40 mg Oral Daily  . pregabalin  75 mg Oral BID  . senna  1 tablet Oral BID  . sodium chloride flush  3 mL Intravenous Q12H  . sodium chloride flush  3 mL Intravenous Q12H  . vitamin B-12  1,000 mcg Oral Daily  . [START ON 06/23/2018] Vitamin D (Ergocalciferol)  50,000 Units Oral Q Mon   Continuous Infusions: . sodium chloride    . sodium chloride 10 mL/hr at 06/20/18 0400  . sodium chloride    . lactated ringers 10 mL/hr at 06/19/18 0825  . methocarbamol (ROBAXIN) IV       LOS: 4 days    Time spent: 76min    Domenic Polite, MD [facility]  06/21/2018, 11:25 AM

## 2018-06-21 NOTE — Progress Notes (Signed)
Patient had removed CPAP due to discomfort. Readjusted mask and changed settings to Auto titrate for patient comfort. Patient resting comfortably.

## 2018-06-21 NOTE — Plan of Care (Signed)
Progressing towards goals

## 2018-06-22 MED ORDER — SODIUM CHLORIDE 0.9 % IV SOLN
60.0000 mg | Freq: Once | INTRAVENOUS | Status: AC
  Administered 2018-06-22: 60 mg via INTRAVENOUS
  Filled 2018-06-22: qty 20

## 2018-06-22 NOTE — Progress Notes (Signed)
Patient not ready for CPAP at this time. Requested RT to return at midnight.

## 2018-06-22 NOTE — Progress Notes (Signed)
NEUROSURGERY PROGRESS NOTE  Doing well. Complains of appropriate back soreness. No acute events overnight.   Temp:  [97.6 F (36.4 C)-99 F (37.2 C)] 98.8 F (37.1 C) (05/03 0803) Pulse Rate:  [88-99] 88 (05/03 0803) Resp:  [17-22] 17 (05/03 0803) BP: (152-176)/(85-128) 166/97 (05/03 0803) SpO2:  [84 %-100 %] 94 % (05/03 0803)  Plan: Continue therapies. aWaiting snf placement   Ellen Chiquito, NP 06/22/2018 9:15 AM

## 2018-06-22 NOTE — Progress Notes (Signed)
PROGRESS NOTE    Ellen [name]  RAQ:[id number] DOB: [birth year] DOA: 06/17/2018 PCP: Deborah Chalk, FNP  Brief Narrative: 58 year old morbidly obese female with history of OSA on CPAP, hypertension, dyslipidemia, started having low back pain, tingling numbness in both lower extremities and gait disorder in January/February following a mechanical fall after this she was still able to perform some ADLs with difficulty, unfortunately suffered another fall in mid March and since then has been unable to move both lower extremities she was admitted to [facility] regional hospital and was subsequently referred for physical therapy. -Presented to the ED yesterday due to worsening low back pain and weakness endorses numbness from umbilicus down. -History of urinary incontinence -Neurosurgery consulted due to paraplegia at T8 11-T12 level with diffuse multilevel stenosis, neurosurgery consulted, plan for surgical decompression/laminectomy given chronicity of her symptoms unlikely she will regain significant strength however any recovery is impossible without surgery  Assessment & Plan:   Severe multilevel spinal stenosis with paraplegia  -Has almost complete loss of strength and sensations in both lower extremity, bladder incontinence -Greatly appreciate neurosurgical input -Underwent decompressive laminectomy of T10-T11 on 4/30  -Continue Lyrica, tramadol, caution with oversedation -Due to chronicity of symptoms less chances of significant recovery -No significant changes neurologically although she does report improved sensations in both lower legs, continue PT OT  -Will need rehab  -start Lovenox for DVT prophylaxis when okay with neurosurgery  Hypercalcemia -Patient is asymptomatic -PTH being normal suggests possibility of primary hyperparathyroidism, SPEP is normal -Ca is trending up and 12.6 today, will give Pamidronate x1  -Follow-up vitamin D level -Will need endocrine follow-up   Hyperglycemia -Hemoglobin A1c is 5.1  Hypomagnesemia -Replaced  Mild anemia  -Anemia panel was unremarkable  Hypertension -Continue amlodipine and enalapril  Depression -Continue Lexapro  OSA -Continue CPAP nightly  Morbid obesity -BMI of 63 -Advised lifestyle modification, caution with oversedation  DVT prophylaxis: Heparin subcutaneous Code Status: Full code  family Communication:no Family at bedside, left msg for Kathaleen Grinder Disposition Plan: SNF for rehab early next week  Consultants:   Neurosurgery   Procedures:   Antimicrobials:    Subjective: -Feels a bit discouraged, no improvement in leg strength  Objective: Vitals:   06/21/18 2347 06/22/18 0438 06/22/18 0803 06/22/18 1207  BP: (!) 159/89 (!) 165/86 (!) 166/97 (!) 171/86  Pulse: 96 90 88 76  Resp: 18 18 17 17   Temp: 99 F (37.2 C) 98.4 F (36.9 C) 98.8 F (37.1 C) 98.4 F (36.9 C)  TempSrc: Oral Oral Oral Oral  SpO2: 98% 97% 94% 97%  Weight:      Height:        Intake/Output Summary (Last 24 hours) at 06/22/2018 1408 Last data filed at 06/21/2018 2217 Gross per 24 hour  Intake 222 ml  Output 800 ml  Net -578 ml   Filed Weights   06/18/18 0002 06/19/18 0500 06/19/18 0806  Weight: (!) 167.8 kg (!) 172.6 kg (!) 172.6 kg    Examination: Gen: Awake, Alert, Oriented X 3, Lee obese, sitting up in bed, no distress HEENT: PERRLA, Neck supple, no JVD Lungs: Good air movement bilaterally, CTAB CVS: RRR,No Gallops,Rubs or new Murmurs Abd: soft, Non tender, non distended, BS present Extremities: No edema skin: no new rashes Central nervous system: Paraplegic with no strength in both lower extremities, sensations light touch improved    Data Reviewed:   CBC: Recent Labs  Lab 06/17/18 2032 06/18/18 0444 06/19/18 0416 06/20/18 0328 06/21/18 0320  WBC  6.8 6.5 5.9 13.4* 11.2*  NEUTROABS 3.3  --   --   --   --   HGB 11.6* 11.3* 11.6* 10.9* 11.7*  HCT 35.6* 34.5*  34.9 36.0 32.2*  36.0  MCV 97.8 97.5 97.3 94.2 97.6  PLT 298 284 291 246 938   Basic Metabolic Panel: Recent Labs  Lab 06/17/18 2032 06/18/18 0444 06/19/18 0416 06/20/18 0544 06/21/18 0320  NA 141 142 141 139 139  K 3.6 3.6 4.2 4.0 4.1  CL 107 108 107 104 102  CO2 29 29 31 26 26   GLUCOSE 108* 107* 102* 143* 107*  BUN 13 12 9 10 10   CREATININE 0.66 0.59 0.59 0.58 0.59  CALCIUM 12.0* 11.9* 12.2* 12.5* 12.6*  MG  --  1.6*  --   --   --   PHOS  --  2.8  --   --   --    GFR: Estimated Creatinine Clearance: 121.8 mL/min (by C-G formula based on SCr of 0.59 mg/dL). Liver Function Tests: Recent Labs  Lab 06/18/18 0444 06/19/18 0416 06/21/18 0320  AST 13* 14* 18  ALT 12 12 15   ALKPHOS 95 95 94  BILITOT 0.6 0.7 0.8  PROT 5.9* 5.8* 6.5  ALBUMIN 2.8* 2.7* 2.9*   No results for input(s): LIPASE, AMYLASE in the last 168 hours. No results for input(s): AMMONIA in the last 168 hours. Coagulation Profile: Recent Labs  Lab 06/18/18 0444  INR 1.2   Cardiac Enzymes: No results for input(s): CKTOTAL, CKMB, CKMBINDEX, TROPONINI in the last 168 hours. BNP (last 3 results) No results for input(s): PROBNP in the last 8760 hours. HbA1C: Recent Labs    06/20/18 0328  HGBA1C 5.1   CBG: No results for input(s): GLUCAP in the last 168 hours. Lipid Profile: No results for input(s): CHOL, HDL, LDLCALC, TRIG, CHOLHDL, LDLDIRECT in the last 72 hours. Thyroid Function Tests: No results for input(s): TSH, T4TOTAL, FREET4, T3FREE, THYROIDAB in the last 72 hours. Anemia Panel: No results for input(s): VITAMINB12, FOLATE, FERRITIN, TIBC, IRON, RETICCTPCT in the last 72 hours. Urine analysis:    Component Value Date/Time   COLORURINE YELLOW 06/18/2018 0524   APPEARANCEUR HAZY (A) 06/18/2018 0524   LABSPEC 1.016 06/18/2018 0524   PHURINE 6.0 06/18/2018 0524   GLUCOSEU NEGATIVE 06/18/2018 0524   GLUCOSEU NEG mg/dL 08/05/2006 2022   HGBUR NEGATIVE 06/18/2018 0524   BILIRUBINUR NEGATIVE 06/18/2018 0524    KETONESUR NEGATIVE 06/18/2018 0524   PROTEINUR NEGATIVE 06/18/2018 0524   UROBILINOGEN 1.0 06/14/2011 1828   NITRITE NEGATIVE 06/18/2018 0524   LEUKOCYTESUR NEGATIVE 06/18/2018 0524   Sepsis Labs: @LABRCNTIP (procalcitonin:4,lacticidven:4)  ) Recent Results (from the past 240 hour(s))  MRSA PCR Screening     Status: None   Collection Time: 06/18/18  1:08 AM  Result Value Ref Range Status   MRSA by PCR NEGATIVE NEGATIVE Final    Comment:        The GeneXpert MRSA Assay (FDA approved for NASAL specimens only), is one component of a comprehensive MRSA colonization surveillance program. It is not intended to diagnose MRSA infection nor to guide or monitor treatment for MRSA infections. Performed at [facility], [place] [address] [place], [place] [address]          Radiology Studies: No results found.      Scheduled Meds: . amLODipine  5 mg Oral Daily  . atorvastatin  20 mg Oral q1800  . docusate sodium  100 mg Oral BID  . enalapril  20 mg Oral Daily  . escitalopram  10 mg Oral Daily  . oxyCODONE-acetaminophen  1 tablet Oral QHS  . pantoprazole  40 mg Oral Daily  . pregabalin  75 mg Oral BID  . senna  1 tablet Oral BID  . sodium chloride flush  3 mL Intravenous Q12H  . sodium chloride flush  3 mL Intravenous Q12H  . vitamin B-12  1,000 mcg Oral Daily  . [START ON 06/23/2018] Vitamin D (Ergocalciferol)  50,000 Units Oral Q Mon   Continuous Infusions: . sodium chloride    . sodium chloride 10 mL/hr at 06/20/18 0400  . sodium chloride    . methocarbamol (ROBAXIN) IV       LOS: 5 days    Time spent: 47min    Domenic Polite, MD [facility]  06/22/2018, 2:08 PM

## 2018-06-23 ENCOUNTER — Inpatient Hospital Stay (HOSPITAL_COMMUNITY): Payer: Medicare HMO

## 2018-06-23 LAB — URINALYSIS, ROUTINE W REFLEX MICROSCOPIC
Bilirubin Urine: NEGATIVE
Glucose, UA: NEGATIVE mg/dL
Ketones, ur: NEGATIVE mg/dL
Leukocytes,Ua: NEGATIVE
Nitrite: NEGATIVE
Protein, ur: NEGATIVE mg/dL
Specific Gravity, Urine: 1.015 (ref 1.005–1.030)
pH: 7 (ref 5.0–8.0)

## 2018-06-23 LAB — PTH-RELATED PEPTIDE: PTH-related peptide: <2 pmol/L

## 2018-06-23 LAB — BASIC METABOLIC PANEL
Anion gap: 6 (ref 5–15)
BUN: 12 mg/dL (ref 6–20)
CO2: 30 mmol/L (ref 22–32)
Calcium: 12.2 mg/dL — ABNORMAL HIGH (ref 8.9–10.3)
Chloride: 103 mmol/L (ref 98–111)
Creatinine, Ser: 0.53 mg/dL (ref 0.44–1.00)
GFR calc Af Amer: >60 mL/min (ref >60)
GFR calc non Af Amer: >60 mL/min (ref >60)
Glucose, Bld: 93 mg/dL (ref 70–99)
Potassium: 3.6 mmol/L (ref 3.5–5.1)
Sodium: 139 mmol/L (ref 135–145)

## 2018-06-23 LAB — GLUCOSE, CAPILLARY: Glucose-Capillary: 88 mg/dL (ref 70–99)

## 2018-06-23 LAB — VITAMIN D 25 HYDROXY (VIT D DEFICIENCY, FRACTURES): Vit D, 25-Hydroxy: 28.6 ng/mL — ABNORMAL LOW (ref 30.0–100.0)

## 2018-06-23 NOTE — Plan of Care (Signed)
Progressing toward goals. 

## 2018-06-23 NOTE — Progress Notes (Signed)
PROGRESS NOTE    Ellen [name]  DHR:[id number] DOB: 07/21/1960 DOA: 06/17/2018 PCP: Deborah Chalk, FNP  Brief Narrative: 58 year old morbidly obese female with history of OSA on CPAP, hypertension, dyslipidemia, started having low back pain, tingling numbness in both lower extremities and gait disorder in January/February following a mechanical fall after this she was still able to perform some ADLs with difficulty, unfortunately suffered another fall in mid March and since then has been unable to move both lower extremities she was admitted to [facility] and was subsequently referred for physical therapy. -Presented to the ED yesterday due to worsening low back pain and weakness endorses numbness from umbilicus down. -History of urinary incontinence -Neurosurgery consulted due to paraplegia at T8 11-T12 level with diffuse multilevel stenosis, neurosurgery consulted, plan for surgical decompression/laminectomy given chronicity of her symptoms unlikely she will regain significant strength however any recovery is impossible without surgery  Assessment & Plan:   Severe multilevel spinal stenosis with paraplegia  -Has almost complete loss of strength and sensations in both lower extremity, bladder incontinence progressive for 65months -Greatly appreciate neurosurgical input -Underwent decompressive laminectomy of T10-T11 on 4/30  -Continue Lyrica, tramadol, caution with oversedation -Due to chronicity of symptoms less chances of significant recovery -No significant changes neurologically although she does report improved sensations in both lower legs, continue PT OT  -TOday with Fever of 102,  Fever 102 today 5/4 -no clear source -check UA, CXR, NSG to monitor surgical site  Hypercalcemia -Patient is asymptomatic -PTH being normal suggests possibility of primary hyperparathyroidism, SPEP is normal -Ca is trended up and 12.6 yesterday 5/3 hence given IV Pamidronate x1   -Vitamin D level is low, continue replacement -Will need endocrine follow-up  Hyperglycemia -Hemoglobin A1c is 5.1  Hypomagnesemia -Replaced  Mild anemia  -Anemia panel was unremarkable  Hypertension -Continue amlodipine and enalapril  Depression -Continue Lexapro  OSA -Continue CPAP nightly  Morbid obesity -BMI of 63 -Advised lifestyle modification, caution with oversedation  DVT prophylaxis: Heparin subcutaneous Code Status: Full code  family Communication:no Family at bedside, left msg for Kathaleen Grinder Disposition Plan: SNF for rehab when afebrile  Consultants:   Neurosurgery   Procedures:   Antimicrobials:    Subjective: -c/o chills today, no cough, congestion, later in am temp 102 -no N/V  Objective: Vitals:   06/23/18 0500 06/23/18 0832 06/23/18 1130 06/23/18 1135  BP:  (!) 143/60 (!) 98/48 135/71  Pulse:  90 (!) 110   Resp:  20 18 18   Temp:  98 F (36.7 C) (!) 102 F (38.9 C)   TempSrc:   Oral   SpO2:  100% 98%   Weight: (!) 165.9 kg     Height:        Intake/Output Summary (Last 24 hours) at 06/23/2018 1230 Last data filed at 06/23/2018 0615 Gross per 24 hour  Intake -  Output 2350 ml  Net -2350 ml   Filed Weights   06/19/18 0500 06/19/18 0806 06/23/18 0500  Weight: (!) 172.6 kg (!) 172.6 kg (!) 165.9 kg    Examination: Gen: Awake, Alert, Oriented X 3, morbidly obese, sitting up in bed, no distress HEENT: PERRLA, Neck supple, no JVD Lungs: Distant breath sounds otherwise clear CVS: RRR,No Gallops,Rubs or new Murmurs Abd: soft, Non tender, non distended, BS present Extremities: No edema Skin: no new rashes Central nervous system: Paraplegic with 0/5 strength in both lower extremities, sensations light touch intact   Data Reviewed:   CBC: Recent Labs  Lab 06/17/18 2032 06/18/18 0444 06/19/18 0416 06/20/18 0328 06/21/18 0320  WBC 6.8 6.5 5.9 13.4* 11.2*  NEUTROABS 3.3  --   --   --   --   HGB 11.6* 11.3* 11.6* 10.9*  11.7*  HCT 35.6* 34.5*  34.9 36.0 32.2* 36.0  MCV 97.8 97.5 97.3 94.2 97.6  PLT 298 284 291 246 546   Basic Metabolic Panel: Recent Labs  Lab 06/18/18 0444 06/19/18 0416 06/20/18 0544 06/21/18 0320 06/23/18 0500  NA 142 141 139 139 139  K 3.6 4.2 4.0 4.1 3.6  CL 108 107 104 102 103  CO2 29 31 26 26 30   GLUCOSE 107* 102* 143* 107* 93  BUN 12 9 10 10 12   CREATININE 0.59 0.59 0.58 0.59 0.53  CALCIUM 11.9* 12.2* 12.5* 12.6* 12.2*  MG 1.6*  --   --   --   --   PHOS 2.8  --   --   --   --    GFR: Estimated Creatinine Clearance: 118.6 mL/min (by C-G formula based on SCr of 0.53 mg/dL). Liver Function Tests: Recent Labs  Lab 06/18/18 0444 06/19/18 0416 06/21/18 0320  AST 13* 14* 18  ALT 12 12 15   ALKPHOS 95 95 94  BILITOT 0.6 0.7 0.8  PROT 5.9* 5.8* 6.5  ALBUMIN 2.8* 2.7* 2.9*   No results for input(s): LIPASE, AMYLASE in the last 168 hours. No results for input(s): AMMONIA in the last 168 hours. Coagulation Profile: Recent Labs  Lab 06/18/18 0444  INR 1.2   Cardiac Enzymes: No results for input(s): CKTOTAL, CKMB, CKMBINDEX, TROPONINI in the last 168 hours. BNP (last 3 results) No results for input(s): PROBNP in the last 8760 hours. HbA1C: No results for input(s): HGBA1C in the last 72 hours. CBG: Recent Labs  Lab 06/23/18 0931  GLUCAP 88   Lipid Profile: No results for input(s): CHOL, HDL, LDLCALC, TRIG, CHOLHDL, LDLDIRECT in the last 72 hours. Thyroid Function Tests: No results for input(s): TSH, T4TOTAL, FREET4, T3FREE, THYROIDAB in the last 72 hours. Anemia Panel: No results for input(s): VITAMINB12, FOLATE, FERRITIN, TIBC, IRON, RETICCTPCT in the last 72 hours. Urine analysis:    Component Value Date/Time   COLORURINE YELLOW 06/18/2018 0524   APPEARANCEUR HAZY (A) 06/18/2018 0524   LABSPEC 1.016 06/18/2018 0524   PHURINE 6.0 06/18/2018 0524   GLUCOSEU NEGATIVE 06/18/2018 0524   GLUCOSEU NEG mg/dL 08/05/2006 2022   HGBUR NEGATIVE 06/18/2018 0524    BILIRUBINUR NEGATIVE 06/18/2018 0524   KETONESUR NEGATIVE 06/18/2018 0524   PROTEINUR NEGATIVE 06/18/2018 0524   UROBILINOGEN 1.0 06/14/2011 1828   NITRITE NEGATIVE 06/18/2018 0524   LEUKOCYTESUR NEGATIVE 06/18/2018 0524   Sepsis Labs: @LABRCNTIP (procalcitonin:4,lacticidven:4)  ) Recent Results (from the past 240 hour(s))  MRSA PCR Screening     Status: None   Collection Time: 06/18/18  1:08 AM  Result Value Ref Range Status   MRSA by PCR NEGATIVE NEGATIVE Final    Comment:        The GeneXpert MRSA Assay (FDA approved for NASAL specimens only), is one component of a comprehensive MRSA colonization surveillance program. It is not intended to diagnose MRSA infection nor to guide or monitor treatment for MRSA infections. Performed at [facility], [place] [address] [place], [place] [address]          Radiology Studies: No results found.      Scheduled Meds: . amLODipine  5 mg Oral Daily  . atorvastatin  20 mg Oral q1800  .  docusate sodium  100 mg Oral BID  . enalapril  20 mg Oral Daily  . escitalopram  10 mg Oral Daily  . oxyCODONE-acetaminophen  1 tablet Oral QHS  . pantoprazole  40 mg Oral Daily  . pregabalin  75 mg Oral BID  . senna  1 tablet Oral BID  . sodium chloride flush  3 mL Intravenous Q12H  . sodium chloride flush  3 mL Intravenous Q12H  . vitamin B-12  1,000 mcg Oral Daily  . Vitamin D (Ergocalciferol)  50,000 Units Oral Q Mon   Continuous Infusions: . sodium chloride    . sodium chloride 10 mL/hr at 06/20/18 0400  . sodium chloride    . methocarbamol (ROBAXIN) IV       LOS: 6 days    Time spent: 79min    Domenic Polite, MD [facility]  06/23/2018, 12:30 PM

## 2018-06-23 NOTE — Progress Notes (Signed)
  NEUROSURGERY PROGRESS NOTE   No issues overnight.  Complains of appropriate back soreness Denies change in motor/sensory  EXAM:  BP 138/77 (BP Location: Right Arm)   Pulse 90   Temp 98.5 F (36.9 C) (Oral)   Resp 18   Ht 5\' 4"  (1.626 m)   Wt (!) 165.9 kg   LMP 07/11/2014   SpO2 96%   BMI 62.78 kg/m   Awake, alert, oriented  Speech fluent, appropriate  CN grossly intact  5/5 BUE, 0/5 BLE Inc  ision c/d/i  IMPRESSION/PLAN 58 y.o. female s/p thoracic laminectomy. No change neurologically. - Continue supportive care - Stable for d/c from NS perspective once SNF bed available. - F/U outpt 3 weeks   Ferne Reus, [facility]

## 2018-06-23 NOTE — Progress Notes (Signed)
Physical Therapy Treatment Patient Details Name: Ellen [name] MRN: [id number] DOB: September 29, 1960 Today's Date: 06/23/2018    History of Present Illness 58 y/o female admitted secondary to worsening back pain and paraplegia. Pt is s/p T10-11 decompressive laminectomy. PMH includes HTN and obesity.     PT Comments    Session limited as pt reports she was not feeling well. Performed PROM exercises on BLE and AROM on UEs. Did note jerking type motions in BUE when performing shoulder flexion. Current recommendations appropriate. Will continue to follow acutely to maximize functional mobility independence and safety.   Follow Up Recommendations  SNF;Supervision/Assistance - 24 hour     Equipment Recommendations  None recommended by PT    Recommendations for Other Services       Precautions / Restrictions Precautions Precautions: Back;Fall Precaution Booklet Issued: Yes (comment) Restrictions Weight Bearing Restrictions: No    Mobility  Bed Mobility               General bed mobility comments: Deferred as pt not feeling well today.   Transfers                    Ambulation/Gait                 Stairs             Wheelchair Mobility    Modified Rankin (Stroke Patients Only)       Balance                                            Cognition Arousal/Alertness: Awake/alert Behavior During Therapy: WFL for tasks assessed/performed Overall Cognitive Status: Impaired/Different from baseline Area of Impairment: Following commands;Awareness;Problem solving;Memory                     Memory: Decreased short-term memory;Decreased recall of precautions Following Commands: Follows one step commands with increased time   Awareness: Emergent Problem Solving: Slow processing;Requires verbal cues;Requires tactile cues;Decreased initiation General Comments: Pt slow to respond to questions and unable to remember precautions  without cues. Slow processing noted and required multimodal cues to perform mobility tasks. pt with decr sustain attention       Exercises General Exercises - Upper Extremity Shoulder Flexion: AAROM;Both;10 reps;Supine;Limitations Shoulder Flexion Limitations: Pt unable to control descent of BUE. Noted jerking type movements when performing ROM.  General Exercises - Lower Extremity Ankle Circles/Pumps: PROM;Both;20 reps;Supine Heel Slides: PROM;Both;10 reps;Supine Other Exercises Other Exercises: PROM into knee flexion and extension to maintain ROM. Performed X 10 bilaterally.     General Comments        Pertinent Vitals/Pain Pain Assessment: Faces Faces Pain Scale: Hurts little more Pain Location: back Pain Descriptors / Indicators: Aching;Operative site guarding Pain Intervention(s): Limited activity within patient's tolerance;Monitored during session;Repositioned    Home Living                      Prior Function            PT Goals (current goals can now be found in the care plan section) Acute Rehab PT Goals Patient Stated Goal: to take a nap  PT Goal Formulation: With patient Time For Goal Achievement: 07/04/18 Potential to Achieve Goals: Fair Progress towards PT goals: Progressing toward goals    Frequency    Min 3X/week  PT Plan Current plan remains appropriate    Co-evaluation              AM-PAC PT "6 Clicks" Mobility   Outcome Measure  Help needed turning from your back to your side while in a flat bed without using bedrails?: Total Help needed moving from lying on your back to sitting on the side of a flat bed without using bedrails?: Total Help needed moving to and from a bed to a chair (including a wheelchair)?: Total Help needed standing up from a chair using your arms (e.g., wheelchair or bedside chair)?: Total Help needed to walk in hospital room?: Total Help needed climbing 3-5 steps with a railing? : Total 6 Click Score:  6    End of Session   Activity Tolerance: Patient limited by fatigue;Patient limited by pain;Treatment limited secondary to medical complications (Comment)(not feeling well ) Patient left: in bed;with call bell/phone within reach;with bed alarm set Nurse Communication: Mobility status PT Visit Diagnosis: Muscle weakness (generalized) (M62.81);Difficulty in walking, not elsewhere classified (R26.2);Pain;Unsteadiness on feet (R26.81) Pain - part of body: (back)     Time: 0109-3235 PT Time Calculation (min) (ACUTE ONLY): 15 min  Charges:  $Therapeutic Exercise: 8-22 mins                     [name], PT, DPT  Acute Rehabilitation Services  Pager: [phone] Office: [phone]    [name] 06/23/2018, 1:21 PM

## 2018-06-23 NOTE — Care Management Important Message (Signed)
Important Message  Patient Details  Name: Ellen [name] MRN: [id number] Date of Birth: Sep 03, 1960   Medicare Important Message Given:  Yes    Orbie Pyo 06/23/2018, 3:08 PM

## 2018-06-23 NOTE — Progress Notes (Signed)
Placed patient on CPAP auto settings via nasal mask. Patient refused FFM.

## 2018-06-23 NOTE — Care Management Important Message (Signed)
Important Message  Patient Details  Name: Ellen [name] MRN: [id number] Date of Birth: [birth year]   Medicare Important Message Given:  Yes    Orbie Pyo 06/23/2018, 3:45 PM

## 2018-06-24 ENCOUNTER — Inpatient Hospital Stay (HOSPITAL_COMMUNITY): Payer: Medicare HMO

## 2018-06-24 LAB — URINE CULTURE

## 2018-06-24 LAB — BASIC METABOLIC PANEL
Anion gap: 6 (ref 5–15)
BUN: 11 mg/dL (ref 6–20)
CO2: 29 mmol/L (ref 22–32)
Calcium: 10.4 mg/dL — ABNORMAL HIGH (ref 8.9–10.3)
Chloride: 98 mmol/L (ref 98–111)
Creatinine, Ser: 0.54 mg/dL (ref 0.44–1.00)
GFR calc Af Amer: >60 mL/min (ref >60)
GFR calc non Af Amer: >60 mL/min (ref >60)
Glucose, Bld: 98 mg/dL (ref 70–99)
Potassium: 3.5 mmol/L (ref 3.5–5.1)
Sodium: 133 mmol/L — ABNORMAL LOW (ref 135–145)

## 2018-06-24 LAB — PROCALCITONIN: Procalcitonin: 0.39 ng/mL

## 2018-06-24 LAB — CBC
HCT: 31.3 % — ABNORMAL LOW (ref 36.0–46.0)
Hemoglobin: 10.3 g/dL — ABNORMAL LOW (ref 12.0–15.0)
MCH: 31.3 pg (ref 26.0–34.0)
MCHC: 32.9 g/dL (ref 30.0–36.0)
MCV: 95.1 fL (ref 80.0–100.0)
Platelets: 205 10*3/uL (ref 150–400)
RBC: 3.29 MIL/uL — ABNORMAL LOW (ref 3.87–5.11)
RDW: 11.5 % (ref 11.5–15.5)
WBC: 5.1 10*3/uL (ref 4.0–10.5)
nRBC: 0.4 % — ABNORMAL HIGH (ref 0.0–0.2)

## 2018-06-24 LAB — NOVEL CORONAVIRUS, NAA (HOSP ORDER, SEND-OUT TO REF LAB; TAT 18-24 HRS): SARS-CoV-2, NAA: NOT DETECTED

## 2018-06-24 MED ORDER — ENOXAPARIN SODIUM 80 MG/0.8ML ~~LOC~~ SOLN
80.0000 mg | SUBCUTANEOUS | Status: DC
  Administered 2018-06-24 – 2018-06-27 (×4): 80 mg via SUBCUTANEOUS
  Filled 2018-06-24 (×4): qty 0.8

## 2018-06-24 NOTE — Progress Notes (Signed)
  NEUROSURGERY PROGRESS NOTE   No issues overnight.  Complains of appropriate back soreness Endorses improving sensation in BLE No change in motor  EXAM:  BP 127/68 (BP Location: Right Arm)   Pulse 89   Temp 99.9 F (37.7 C) (Oral)   Resp 20   Ht 5\' 4"  (1.626 m)   Wt (!) 165.9 kg   LMP 07/11/2014   SpO2 98%   BMI 62.78 kg/m   Awake, alert, oriented  Speech fluent, appropriate  CN grossly intact  5/5 BUE, 0/5 BLE Incision: c/d/i, no redness/warmth/swelling/drainage  IMPRESSION/PLAN 58 y.o. female s/p thoracic laminectomy. No change neurologically. Now with fever secondary to ?PNA. No sign of infection at surgical site. - Continue supportive care - Stable for d/c from NS perspective once SNF bed available. - F/U outpt 3 weeks   Ferne Reus, [facility]

## 2018-06-24 NOTE — Progress Notes (Signed)
Patient has temp of 100.6. Tylenol was given around 1015 for pain. Dr. Broadus John paged. Nurse will continue to monitor. [place]

## 2018-06-24 NOTE — Progress Notes (Signed)
Patient was placed on CPAP with auto settings. Patient states she is comfortable at this time.

## 2018-06-24 NOTE — Progress Notes (Signed)
PROGRESS NOTE    Ellen [name]  OEU:[id number] DOB: [birth year] DOA: 06/17/2018 PCP: Deborah Chalk, FNP  Brief Narrative: 58 year old morbidly obese female with history of OSA on CPAP, hypertension, dyslipidemia, started having low back pain, tingling numbness in both lower extremities and gait disorder in January/February following a mechanical fall after this she was still able to perform some ADLs with difficulty, unfortunately suffered another fall in mid March and since then has been unable to move both lower extremities she was admitted to [facility] and was subsequently referred for physical therapy. -Presented to the ED 4/28 due to worsening low back pain and weakness endorses numbness from umbilicus down. -Neurosurgery consulted due to paraplegia at T8 11-T12 level with diffuse multilevel stenosis, neurosurgery consulted, s/p lumbar decompression, laminectomy, unfortunately due to chronicity of injury has had No neurological improvement yet -Postop course complicated by fever, work-up unrevealing  Assessment & Plan:   Severe multilevel spinal stenosis with paraplegia  -Admitted with paraplegia, complete loss of strength and sensations in both lower extremity, bladder incontinence progressive for 69months -Neurosurgery consulted, underwent decompressive laminectomy of T10-T11 on 4/30  -Continue Lyrica, tramadol -Due to chronicity of symptoms less chances of significant recovery -No significant changes neurologically although she does report improved sensations in both lower legs -Continue PT OT -Plan for SNF for rehabilitation once afebrile  Fever of 102 -Postop day 4 patient was noted to have fever of 102 yesterday 5/4 -Urinalysis unremarkable, urine culture with multiple species, portable chest x-ray is concerning for possible pneumonia, repeat chest x-ray today is unrevealing, patient has no respiratory symptoms whatsoever -No nausea vomiting abdominal pain or  diarrhea -Novel coronavirus PCR is also negative -Fever curve is improving, isolated temp of 100.6 today -Surgical site unremarkable for neurosurgery -Incentive spirometer, monitor off antibiotics at this time -Check procalcitonin  Hypercalcemia -Patient is asymptomatic -PTH being normal with hypercalcemia suggests possibility of primary hyperparathyroidism, SPEP is normal -Ca is trended up and 12.6 yesterday 5/3 hence given IV Pamidronate x1  -Vitamin D level is low, continue replacement -Will need endocrine follow-up -Calcium slowly trending down  Hyperglycemia -Hemoglobin A1c is 5.1  Hypomagnesemia -Replaced  Mild anemia  -Anemia panel was unremarkable  Hypertension -Continue amlodipine and enalapril  Depression -Continue Lexapro  OSA -Continue CPAP nightly  Morbid obesity -BMI of 63 -Advised lifestyle modification, caution with oversedation  DVT prophylaxis: Lovenox Code Status: Full code  family Communication:no Family at bedside, left msg for Kathaleen Grinder Disposition Plan: SNF for rehab when afebrile  Consultants:   Neurosurgery   Procedures:   Antimicrobials:    Subjective: -Feels well today, no further fever since yesterday morning -Denies cough congestion nausea vomiting abdominal pain or diarrhea -No strength in her legs, has sensations Objective: Vitals:   06/24/18 0104 06/24/18 0406 06/24/18 0728 06/24/18 1123  BP: 128/65 (!) 131/117 127/68 119/68  Pulse: 81 82 89 93  Resp: 16 17 20 20   Temp: 98.4 F (36.9 C) 98.1 F (36.7 C) 99.9 F (37.7 C) (!) 100.6 F (38.1 C)  TempSrc: Oral Oral Oral Oral  SpO2: 99% (!) 81% 98% 95%  Weight:      Height:        Intake/Output Summary (Last 24 hours) at 06/24/2018 1430 Last data filed at 06/24/2018 1330 Gross per 24 hour  Intake 240 ml  Output 1100 ml  Net -860 ml   Filed Weights   06/19/18 0500 06/19/18 0806 06/23/18 0500  Weight: (!) 172.6 kg (!) 172.6 kg Marland Kitchen)  165.9 kg     Examination: Gen: Acutely obese pleasant female, sitting up in bed, no distress HEENT: PERRLA, Neck supple, no JVD Lungs: Distant breath sounds CVS: S1-S2/regular rate rhythm Abd: soft, Non tender, non distended, BS present Extremities: No edema Skin: no new rashes Central nervous system: Paraplegic with 0/5 strength in both lower extremities, sensations light touch intact   Data Reviewed:   CBC: Recent Labs  Lab 06/17/18 2032 06/18/18 0444 06/19/18 0416 06/20/18 0328 06/21/18 0320 06/24/18 0738  WBC 6.8 6.5 5.9 13.4* 11.2* 5.1  NEUTROABS 3.3  --   --   --   --   --   HGB 11.6* 11.3* 11.6* 10.9* 11.7* 10.3*  HCT 35.6* 34.5*   34.9 36.0 32.2* 36.0 31.3*  MCV 97.8 97.5 97.3 94.2 97.6 95.1  PLT 298 284 291 246 246 637   Basic Metabolic Panel: Recent Labs  Lab 06/18/18 0444 06/19/18 0416 06/20/18 0544 06/21/18 0320 06/23/18 0500 06/24/18 0738  NA 142 141 139 139 139 133*  K 3.6 4.2 4.0 4.1 3.6 3.5  CL 108 107 104 102 103 98  CO2 29 31 26 26 30 29   GLUCOSE 107* 102* 143* 107* 93 98  BUN 12 9 10 10 12 11   CREATININE 0.59 0.59 0.58 0.59 0.53 0.54  CALCIUM 11.9* 12.2* 12.5* 12.6* 12.2* 10.4*  MG 1.6*  --   --   --   --   --   PHOS 2.8  --   --   --   --   --    GFR: Estimated Creatinine Clearance: 118.6 mL/min (by C-G formula based on SCr of 0.54 mg/dL). Liver Function Tests: Recent Labs  Lab 06/18/18 0444 06/19/18 0416 06/21/18 0320  AST 13* 14* 18  ALT 12 12 15   ALKPHOS 95 95 94  BILITOT 0.6 0.7 0.8  PROT 5.9* 5.8* 6.5  ALBUMIN 2.8* 2.7* 2.9*   No results for input(s): LIPASE, AMYLASE in the last 168 hours. No results for input(s): AMMONIA in the last 168 hours. Coagulation Profile: Recent Labs  Lab 06/18/18 0444  INR 1.2   Cardiac Enzymes: No results for input(s): CKTOTAL, CKMB, CKMBINDEX, TROPONINI in the last 168 hours. BNP (last 3 results) No results for input(s): PROBNP in the last 8760 hours. HbA1C: No results for input(s): HGBA1C in  the last 72 hours. CBG: Recent Labs  Lab 06/23/18 0931  GLUCAP 88   Lipid Profile: No results for input(s): CHOL, HDL, LDLCALC, TRIG, CHOLHDL, LDLDIRECT in the last 72 hours. Thyroid Function Tests: No results for input(s): TSH, T4TOTAL, FREET4, T3FREE, THYROIDAB in the last 72 hours. Anemia Panel: No results for input(s): VITAMINB12, FOLATE, FERRITIN, TIBC, IRON, RETICCTPCT in the last 72 hours. Urine analysis:    Component Value Date/Time   COLORURINE YELLOW 06/23/2018 1509   APPEARANCEUR CLOUDY (A) 06/23/2018 1509   LABSPEC 1.015 06/23/2018 1509   PHURINE 7.0 06/23/2018 1509   GLUCOSEU NEGATIVE 06/23/2018 1509   GLUCOSEU NEG mg/dL 08/05/2006 2022   HGBUR SMALL (A) 06/23/2018 1509   BILIRUBINUR NEGATIVE 06/23/2018 1509   KETONESUR NEGATIVE 06/23/2018 1509   PROTEINUR NEGATIVE 06/23/2018 1509   UROBILINOGEN 1.0 06/14/2011 1828   NITRITE NEGATIVE 06/23/2018 1509   LEUKOCYTESUR NEGATIVE 06/23/2018 1509   Sepsis Labs: @LABRCNTIP (procalcitonin:4,lacticidven:4)  ) Recent Results (from the past 240 hour(s))  MRSA PCR Screening     Status: None   Collection Time: 06/18/18  1:08 AM  Result Value Ref Range Status   MRSA by PCR NEGATIVE  NEGATIVE Final    Comment:        The GeneXpert MRSA Assay (FDA approved for NASAL specimens only), is one component of a comprehensive MRSA colonization surveillance program. It is not intended to diagnose MRSA infection nor to guide or monitor treatment for MRSA infections. Performed at [facility], [place] [address] [place], [place] [address]   Culture, Urine     Status: Abnormal   Collection Time: 06/23/18 12:27 PM  Result Value Ref Range Status   Specimen Description URINE, CLEAN CATCH  Final   Special Requests   Final    NONE Performed at [facility], [place] [address] [place], [place] [address]    Culture MULTIPLE SPECIES PRESENT, SUGGEST RECOLLECTION (A)  Final   Report Status 06/24/2018 FINAL  Final  Novel  Coronavirus, NAA (hospital order; send-out to ref lab)     Status: None   Collection Time: 06/23/18  3:07 PM  Result Value Ref Range Status   SARS-CoV-2, NAA NOT DETECTED NOT DETECTED Final    Comment: (NOTE) This test was developed and its performance characteristics determined by [employer]. This test has not been FDA cleared or approved. This test has been authorized by FDA under an Emergency Use Authorization (EUA). This test is only authorized for the duration of time the declaration that circumstances exist justifying the authorization of the emergency use of in vitro diagnostic tests for detection of SARS-CoV-2 virus and/or diagnosis of COVID-19 infection under section 564(b)(1) of the Act, 21 U.S.C. 017BLT-9(Q)(3), unless the authorization is terminated or revoked sooner. When diagnostic testing is negative, the possibility of a false negative result should be considered in the context of a patient's recent exposures and the presence of clinical signs and symptoms consistent with COVID-19. An individual without symptoms of COVID-19 and who is not shedding SARS-CoV-2 virus would expect to have a negative (not detected) result in this assay. Performed  At: [facility] [address] [place], [place] [id number] Rush Farmer MD MA:[id number]    [place]  Final    Comment: Performed at [facility], [place] [address] [place], [place] [address]         Radiology Studies: Dg Chest 2 View  Result Date: 06/24/2018 CLINICAL DATA:  Fever.  Recent lumbar surgery. EXAM: CHEST - 2 VIEW COMPARISON:  06/23/2018 FINDINGS: Body habitus reduces diagnostic sensitivity and specificity. Lower thoracic spondylosis. The lungs currently appear clear. No pleural effusion. Heart size within normal limits for AP projection. IMPRESSION: 1. The lungs currently appear clear. 2. Mild thoracic spondylosis. Electronically Signed   By: Van Clines M.D.    On: 06/24/2018 12:18   Dg Chest Port 1 View  Result Date: 06/23/2018 CLINICAL DATA:  Shortness of breath and fever. EXAM: PORTABLE CHEST 1 VIEW COMPARISON:  05/30/2018; chest CT-05/30/2018 FINDINGS: Examination is degraded due to patient body habitus and portable technique. Grossly unchanged enlarged cardiac silhouette and mediastinal contours given reduced slightly reduced lung volumes. Interval development of scattered bilateral ill-defined nodular opacities. No pleural effusion or pneumothorax. No evidence of edema. No definite acute osseous abnormalities. IMPRESSION: Apparent development of scattered bilateral ill-defined nodular opacities on this body habitus degraded examination. While nonspecific, findings are worrisome for development of atypical infection. Further evaluation with a PA and lateral chest radiograph may be obtained as clinically indicated. Electronically Signed   By: Sandi Mariscal M.D.   On: 06/23/2018 15:07        Scheduled Meds:  amLODipine  5 mg Oral Daily   atorvastatin  20 mg Oral q1800   docusate sodium  100 mg Oral BID   enalapril  20 mg Oral Daily   escitalopram  10 mg Oral Daily   oxyCODONE-acetaminophen  1 tablet Oral QHS   pantoprazole  40 mg Oral Daily   pregabalin  75 mg Oral BID   senna  1 tablet Oral BID   sodium chloride flush  3 mL Intravenous Q12H   vitamin B-12  1,000 mcg Oral Daily   Vitamin D (Ergocalciferol)  50,000 Units Oral Q Mon   Continuous Infusions:  sodium chloride     sodium chloride 10 mL/hr at 06/20/18 0400   methocarbamol (ROBAXIN) IV       LOS: 7 days    Time spent: 66min    Domenic Polite, MD [facility]  06/24/2018, 2:30 PM

## 2018-06-25 DIAGNOSIS — G4733 Obstructive sleep apnea (adult) (pediatric): Secondary | ICD-10-CM

## 2018-06-25 DIAGNOSIS — R29898 Other symptoms and signs involving the musculoskeletal system: Secondary | ICD-10-CM

## 2018-06-25 LAB — BASIC METABOLIC PANEL
Anion gap: 10 (ref 5–15)
BUN: 11 mg/dL (ref 6–20)
CO2: 28 mmol/L (ref 22–32)
Calcium: 9.6 mg/dL (ref 8.9–10.3)
Chloride: 95 mmol/L — ABNORMAL LOW (ref 98–111)
Creatinine, Ser: 0.64 mg/dL (ref 0.44–1.00)
GFR calc Af Amer: >60 mL/min (ref >60)
GFR calc non Af Amer: >60 mL/min (ref >60)
Glucose, Bld: 108 mg/dL — ABNORMAL HIGH (ref 70–99)
Potassium: 3.3 mmol/L — ABNORMAL LOW (ref 3.5–5.1)
Sodium: 133 mmol/L — ABNORMAL LOW (ref 135–145)

## 2018-06-25 LAB — CBC
HCT: 32.8 % — ABNORMAL LOW (ref 36.0–46.0)
Hemoglobin: 11 g/dL — ABNORMAL LOW (ref 12.0–15.0)
MCH: 32 pg (ref 26.0–34.0)
MCHC: 33.5 g/dL (ref 30.0–36.0)
MCV: 95.3 fL (ref 80.0–100.0)
Platelets: 202 10*3/uL (ref 150–400)
RBC: 3.44 MIL/uL — ABNORMAL LOW (ref 3.87–5.11)
RDW: 11.5 % (ref 11.5–15.5)
WBC: 5.6 10*3/uL (ref 4.0–10.5)
nRBC: 0 % (ref 0.0–0.2)

## 2018-06-25 MED ORDER — POTASSIUM CHLORIDE CRYS ER 20 MEQ PO TBCR
40.0000 meq | EXTENDED_RELEASE_TABLET | Freq: Once | ORAL | Status: AC
  Administered 2018-06-25: 40 meq via ORAL
  Filled 2018-06-25: qty 2

## 2018-06-25 NOTE — Progress Notes (Signed)
Occupational Therapy Treatment Patient Details Name: Ellen [name] MRN: [id number] DOB: 1960-05-01 Today's Date: 06/25/2018    History of present illness 58 y/o female admitted secondary to worsening back pain and paraplegia. Pt is s/p T10-11 decompressive laminectomy. PMH includes HTN and obesity.    OT comments  Patient seen for OT/PT, session focused on bed mobility for self care and mobility progression.  Patient continues to require max assist +2 for rolling, total assist for mgmt of toileting (pads wet underneath pt) with poor awareness of purewick not working (in puddle of urine).  Patient requires 1 step cueing, increased time to process and follow commands.  DC plan remains appropriate.  Will follow.    Follow Up Recommendations  SNF;Supervision/Assistance - 24 hour    Equipment Recommendations  Wheelchair cushion (measurements OT);Wheelchair (measurements OT);Hospital bed    Recommendations for Other Services      Precautions / Restrictions Precautions Precautions: Back;Fall Restrictions Weight Bearing Restrictions: No       Mobility Bed Mobility Overal bed mobility: Needs Assistance Bed Mobility: Rolling Rolling: Max assist;+2 for physical assistance;+2 for safety/equipment         General bed mobility comments: cues for sequencing and for use of rails; assistance required for bilat LE movements and hips with use of bed pad; pt able to reach to side rails and rail on HOB; approximation through bilat LE for positioning in bed   Transfers                 General transfer comment: HOYER LIFT ONLY!!!    Balance                                           ADL either performed or assessed with clinical judgement   ADL Overall ADL's : Needs assistance/impaired     Grooming: Set up;Bed level                       Toileting- Clothing Manipulation and Hygiene: Total assistance;Bed level Toileting - Clothing Manipulation Details  (indicate cue type and reason): total assist to change pads after purewick leaking              Vision       Perception     Praxis      Cognition Arousal/Alertness: Awake/alert Behavior During Therapy: WFL for tasks assessed/performed Overall Cognitive Status: No family/caregiver present to determine baseline cognitive functioning Area of Impairment: Following commands;Problem solving;Memory                     Memory: Decreased short-term memory;Decreased recall of precautions Following Commands: Follows one step commands with increased time;Follows one step commands inconsistently   Awareness: Emergent Problem Solving: Slow processing;Requires verbal cues;Requires tactile cues;Decreased initiation General Comments: pt slow to respond, increased time to follow 1 step commands given multimodal cueing         Exercises Exercises: General Upper Extremity;Other exercises General Exercises - Upper Extremity Shoulder Flexion: AROM;Both;10 reps;Supine Shoulder Extension: AROM;Both;10 reps;Supine Other Exercises Other Exercises: AROM shoulder retraction and forward press x 10 reps    Shoulder Instructions       General Comments      Pertinent Vitals/ Pain       Pain Assessment: Faces Faces Pain Scale: Hurts little more Pain Location: back Pain Descriptors / Indicators: Aching;Sore;Grimacing Pain Intervention(s): Limited  activity within patient's tolerance;Monitored during session;Repositioned  Home Living                                          Prior Functioning/Environment              Frequency  Min 2X/week        Progress Toward Goals  OT Goals(current goals can now be found in the care plan section)  Progress towards OT goals: Progressing toward goals  Acute Rehab OT Goals Patient Stated Goal: none stated  Plan Discharge plan remains appropriate;Frequency remains appropriate    Co-evaluation    PT/OT/SLP  Co-Evaluation/Treatment: Yes Reason for Co-Treatment: For patient/therapist safety;To address functional/ADL transfers PT goals addressed during session: Mobility/safety with mobility;Strengthening/ROM OT goals addressed during session: ADL's and self-care      AM-PAC OT "6 Clicks" Daily Activity     Outcome Measure   Help from another person eating meals?: A Lot Help from another person taking care of personal grooming?: A Lot Help from another person toileting, which includes using toliet, bedpan, or urinal?: Total Help from another person bathing (including washing, rinsing, drying)?: Total Help from another person to put on and taking off regular upper body clothing?: A Lot Help from another person to put on and taking off regular lower body clothing?: Total 6 Click Score: 9    End of Session    OT Visit Diagnosis: Unsteadiness on feet (R26.81);Muscle weakness (generalized) (M62.81);Pain Pain - part of body: (stomach, back)   Activity Tolerance Patient tolerated treatment well   Patient Left in bed;with call bell/phone within reach;with bed alarm set   Nurse Communication Mobility status;Need for lift equipment;Precautions;Weight bearing status        Time: 5003-7048 OT Time Calculation (min): 25 min  Charges: OT General Charges $OT Visit: 1 Visit OT Treatments $Self Care/Home Management : 8-22 mins  Delight Stare, OT Acute Rehabilitation Services Pager [phone] Office [phone]    Delight Stare 06/25/2018, 4:38 PM

## 2018-06-25 NOTE — Progress Notes (Signed)
Physical Therapy Treatment Patient Details Name: Ellen [name] MRN: [id number] DOB: 02-10-1961 Today's Date: 06/25/2018    History of Present Illness 57 y/o female admitted secondary to worsening back pain and paraplegia. Pt is s/p T10-11 decompressive laminectomy. PMH includes HTN and obesity.     PT Comments    Patient seen for mobility progression. This session focused on bed mobility and therex. Continue to progress as tolerated with anticipated d/c to SNF for further skilled PT services.     Follow Up Recommendations  SNF;Supervision/Assistance - 24 hour     Equipment Recommendations  None recommended by PT    Recommendations for Other Services       Precautions / Restrictions Precautions Precautions: Back;Fall Restrictions Weight Bearing Restrictions: No    Mobility  Bed Mobility Overal bed mobility: Needs Assistance Bed Mobility: Rolling Rolling: Max assist;+2 for physical assistance;+2 for safety/equipment         General bed mobility comments: cues for sequencing and for use of rails; assistance required for bilat LE movements and hips with use of bed pad; pt able to reach to side rails and rail on HOB; approximation through bilat LE for positioning in bed   Transfers                 General transfer comment: HOYER LIFT ONLY!!!  Ambulation/Gait                 Stairs             Wheelchair Mobility    Modified Rankin (Stroke Patients Only)       Balance                                            Cognition Arousal/Alertness: Awake/alert Behavior During Therapy: WFL for tasks assessed/performed Overall Cognitive Status: No family/caregiver present to determine baseline cognitive functioning Area of Impairment: Following commands;Problem solving;Memory                     Memory: Decreased short-term memory;Decreased recall of precautions Following Commands: Follows one step commands with  increased time;Follows one step commands inconsistently   Awareness: Emergent Problem Solving: Slow processing;Requires verbal cues;Requires tactile cues;Decreased initiation General Comments: pt slow to respond, increased time to follow 1 step commands given multimodal cueing       Exercises General Exercises - Upper Extremity Shoulder Flexion: AROM;Both;10 reps;Supine Shoulder Extension: AROM;Both;10 reps;Supine Other Exercises Other Exercises: AROM shoulder retraction and forward press x 10 reps     General Comments        Pertinent Vitals/Pain Pain Assessment: Faces Faces Pain Scale: Hurts little more Pain Location: back Pain Descriptors / Indicators: Aching;Sore;Grimacing Pain Intervention(s): Limited activity within patient's tolerance;Monitored during session;Repositioned    Home Living                      Prior Function            PT Goals (current goals can now be found in the care plan section) Acute Rehab PT Goals Patient Stated Goal: none stated Progress towards PT goals: Progressing toward goals    Frequency    Min 3X/week      PT Plan Current plan remains appropriate    Co-evaluation PT/OT/SLP Co-Evaluation/Treatment: Yes Reason for Co-Treatment: For patient/therapist safety;To address functional/ADL transfers PT goals addressed during session:  Mobility/safety with mobility;Strengthening/ROM OT goals addressed during session: ADL's and self-care      AM-PAC PT "6 Clicks" Mobility   Outcome Measure  Help needed turning from your back to your side while in a flat bed without using bedrails?: A Lot Help needed moving from lying on your back to sitting on the side of a flat bed without using bedrails?: Total Help needed moving to and from a bed to a chair (including a wheelchair)?: Total Help needed standing up from a chair using your arms (e.g., wheelchair or bedside chair)?: Total Help needed to walk in hospital room?: Total Help  needed climbing 3-5 steps with a railing? : Total 6 Click Score: 7    End of Session   Activity Tolerance: Patient tolerated treatment well Patient left: in bed;with call bell/phone within reach;with bed alarm set Nurse Communication: Mobility status PT Visit Diagnosis: Muscle weakness (generalized) (M62.81);Difficulty in walking, not elsewhere classified (R26.2);Pain;Unsteadiness on feet (R26.81) Pain - part of body: (back)     Time: 4599-7741 PT Time Calculation (min) (ACUTE ONLY): 29 min  Charges:  $Therapeutic Activity: 8-22 mins                     Earney Navy, PTA Acute Rehabilitation Services Pager: [phone] Office: [phone]     Darliss Cheney 06/25/2018, 5:02 PM

## 2018-06-25 NOTE — Progress Notes (Signed)
PROGRESS NOTE    Ellen [name]  RSW:[id number] DOB: 12/05/1960 DOA: 06/17/2018 PCP: Deborah Chalk, FNP  Brief Narrative:  58 year old morbidly obese female with history of OSA on CPAP, hypertension, dyslipidemia, started having low back pain, tingling numbness in both lower extremities and gait disorder in January/February following a mechanical fall after this she was still able to perform some ADLs with difficulty, unfortunately suffered another fall in mid March and since then has been unable to move both lower extremities she was admitted to [facility] regional hospital and was subsequently referred for physical therapy. -Presented to the ED 4/28 due to worsening low back pain and weakness endorses numbness from umbilicus down. -Neurosurgery consulted due to paraplegia at T8 11-T12 level with diffuse multilevel stenosis, neurosurgery consulted, s/p lumbar decompression, laminectomy, unfortunately due to chronicity of injury has had No neurological improvement yet -Postop course complicated by fever, work-up unrevealing  Assessment & Plan:   Principal Problem:   Back pain Active Problems:   Hyperlipidemia   Essential hypertension   OSA (obstructive sleep apnea)   Hypercalcemia   Severe multilevel spinal stenosis with paraplegia Patient was admitted with paraplegia with complete loss of strength and sensation in both lower extremities with bladder incontinence. Neurosurgery was consulted and she underwent decompressive laminectomy of T10 and T12 on 4/30. Continue with Lyrica and tramadol for pain and PT evaluation recommend SNF for discharge   Fever of unclear etiology Chest x-ray does not show any pneumonia. Urine analysis is negative for infection.  Blood cultures were drawn today and pending. Will get  MRI T SPINE.  Novel corona virus PCR is negative.  Neuro surgery suggests no infection at the surgical site.    Hypercalcemia:  Normalized.    Hyperglycemia:   Resolved.    Hypomagnesemia:  Replaced.    Hypertension: - well controlled.    Depression:  Lexapro.    OSA;  CPAP at night.    Morbid obesity:  Outpatient follow up .        DVT prophylaxis: lovenox.  Code Status: full code Family Communication: none at bedside Disposition Plan: pending further work up..   Consultants:  Dr Roseanne Reno Neuro surgery.    Procedures: laminectomy    Antimicrobials:  none   Subjective: no chest pain  Objective: Vitals:   06/25/18 0423 06/25/18 0732 06/25/18 1316 06/25/18 1544  BP: 102/65 100/60 104/64 130/69  Pulse: 83 76 78 80  Resp: 16 14 18 18   Temp: 98.3 F (36.8 C) 99.7 F (37.6 C) (!) 101.3 F (38.5 C) (!) 101 F (38.3 C)  TempSrc: Oral Oral Oral Oral  SpO2: 97% 97% 100% 98%  Weight:      Height:        Intake/Output Summary (Last 24 hours) at 06/25/2018 1635 Last data filed at 06/25/2018 0744 Gross per 24 hour  Intake -  Output 1200 ml  Net -1200 ml   Filed Weights   06/19/18 0500 06/19/18 0806 06/23/18 0500  Weight: (!) 172.6 kg (!) 172.6 kg (!) 165.9 kg    Examination:  General exam: Appears calm and comfortable  Respiratory system: Clear to auscultation. Respiratory effort normal. Cardiovascular system: S1 & S2 heard, RRR.Marland Kitchen No pedal edema. Gastrointestinal system: Abdomen is nondistended, soft and nontender.  Normal bowel sounds heard. Central nervous system: Alert and oriented. No focal neurological deficits. Extremities: Symmetric 5 x 5 power. Skin: No rashes, lesions or ulcers Psychiatry:  Mood & affect appropriate.     Data Reviewed: I  have personally reviewed following labs and imaging studies  CBC: Recent Labs  Lab 06/19/18 0416 06/20/18 0328 06/21/18 0320 06/24/18 0738 06/25/18 0357  WBC 5.9 13.4* 11.2* 5.1 5.6  HGB 11.6* 10.9* 11.7* 10.3* 11.0*  HCT 36.0 32.2* 36.0 31.3* 32.8*  MCV 97.3 94.2 97.6 95.1 95.3  PLT 291 246 246 205 035   Basic Metabolic Panel: Recent Labs   Lab 06/20/18 0544 06/21/18 0320 06/23/18 0500 06/24/18 0738 06/25/18 0357  NA 139 139 139 133* 133*  K 4.0 4.1 3.6 3.5 3.3*  CL 104 102 103 98 95*  CO2 26 26 30 29 28   GLUCOSE 143* 107* 93 98 108*  BUN 10 10 12 11 11   CREATININE 0.58 0.59 0.53 0.54 0.64  CALCIUM 12.5* 12.6* 12.2* 10.4* 9.6   GFR: Estimated Creatinine Clearance: 118.6 mL/min (by C-G formula based on SCr of 0.64 mg/dL). Liver Function Tests: Recent Labs  Lab 06/19/18 0416 06/21/18 0320  AST 14* 18  ALT 12 15  ALKPHOS 95 94  BILITOT 0.7 0.8  PROT 5.8* 6.5  ALBUMIN 2.7* 2.9*   No results for input(s): LIPASE, AMYLASE in the last 168 hours. No results for input(s): AMMONIA in the last 168 hours. Coagulation Profile: No results for input(s): INR, PROTIME in the last 168 hours. Cardiac Enzymes: No results for input(s): CKTOTAL, CKMB, CKMBINDEX, TROPONINI in the last 168 hours. BNP (last 3 results) No results for input(s): PROBNP in the last 8760 hours. HbA1C: No results for input(s): HGBA1C in the last 72 hours. CBG: Recent Labs  Lab 06/23/18 0931  GLUCAP 88   Lipid Profile: No results for input(s): CHOL, HDL, LDLCALC, TRIG, CHOLHDL, LDLDIRECT in the last 72 hours. Thyroid Function Tests: No results for input(s): TSH, T4TOTAL, FREET4, T3FREE, THYROIDAB in the last 72 hours. Anemia Panel: No results for input(s): VITAMINB12, FOLATE, FERRITIN, TIBC, IRON, RETICCTPCT in the last 72 hours. Sepsis Labs: Recent Labs  Lab 06/24/18 1519  PROCALCITON 0.39    Recent Results (from the past 240 hour(s))  MRSA PCR Screening     Status: None   Collection Time: 06/18/18  1:08 AM  Result Value Ref Range Status   MRSA by PCR NEGATIVE NEGATIVE Final    Comment:        The GeneXpert MRSA Assay (FDA approved for NASAL specimens only), is one component of a comprehensive MRSA colonization surveillance program. It is not intended to diagnose MRSA infection nor to guide or monitor treatment for MRSA  infections. Performed at [facility], [place] [address] [place], [place] [address]   Culture, Urine     Status: Abnormal   Collection Time: 06/23/18 12:27 PM  Result Value Ref Range Status   Specimen Description URINE, CLEAN CATCH  Final   Special Requests   Final    NONE Performed at [facility], [place] [address], [place], [place] [address]    Culture MULTIPLE SPECIES PRESENT, SUGGEST RECOLLECTION (A)  Final   Report Status 06/24/2018 FINAL  Final  Novel Coronavirus, NAA (hospital order; send-out to ref lab)     Status: None   Collection Time: 06/23/18  3:07 PM  Result Value Ref Range Status   SARS-CoV-2, NAA NOT DETECTED NOT DETECTED Final    Comment: (NOTE) This test was developed and its performance characteristics determined by [employer]. This test has not been FDA cleared or approved. This test has been authorized by FDA under an Emergency Use Authorization (EUA). This test is only authorized  for the duration of time the declaration that circumstances exist justifying the authorization of the emergency use of in vitro diagnostic tests for detection of SARS-CoV-2 virus and/or diagnosis of COVID-19 infection under section 564(b)(1) of the Act, 21 U.S.C. 720NOB-0(J)(6), unless the authorization is terminated or revoked sooner. When diagnostic testing is negative, the possibility of a false negative result should be considered in the context of a patient's recent exposures and the presence of clinical signs and symptoms consistent with COVID-19. An individual without symptoms of COVID-19 and who is not shedding SARS-CoV-2 virus would expect to have a negative (not detected) result in this assay. Performed  At: [facility] [address] [place], [place] [id number] Rush Farmer MD ML:[id number]    [place]  Final    Comment: Performed at [facility], [place] [address], [place],  [address]          Radiology Studies: Dg Chest 2 View  Result Date: 06/24/2018 CLINICAL DATA:  Fever.  Recent lumbar surgery. EXAM: CHEST - 2 VIEW COMPARISON:  06/23/2018 FINDINGS: Body habitus reduces diagnostic sensitivity and specificity. Lower thoracic spondylosis. The lungs currently appear clear. No pleural effusion. Heart size within normal limits for AP projection. IMPRESSION: 1. The lungs currently appear clear. 2. Mild thoracic spondylosis. Electronically Signed   By: Van Clines M.D.   On: 06/24/2018 12:18        Scheduled Meds: . amLODipine  5 mg Oral Daily  . atorvastatin  20 mg Oral q1800  . docusate sodium  100 mg Oral BID  . enalapril  20 mg Oral Daily  . enoxaparin (LOVENOX) injection  80 mg Subcutaneous Q24H  . escitalopram  10 mg Oral Daily  . oxyCODONE-acetaminophen  1 tablet Oral QHS  . pantoprazole  40 mg Oral Daily  . pregabalin  75 mg Oral BID  . senna  1 tablet Oral BID  . sodium chloride flush  3 mL Intravenous Q12H  . vitamin B-12  1,000 mcg Oral Daily  . Vitamin D (Ergocalciferol)  50,000 Units Oral Q Mon   Continuous Infusions: . sodium chloride    . sodium chloride 10 mL/hr at 06/20/18 0400  . methocarbamol (ROBAXIN) IV       LOS: 8 days    Time spent: 35 minutes    Hosie Poisson, MD [facility] Pager [phone]  If 7PM-7AM, please contact night-coverage [email or web address] Password [facility] 06/25/2018, 4:35 PM

## 2018-06-26 ENCOUNTER — Inpatient Hospital Stay (HOSPITAL_COMMUNITY): Payer: Medicare HMO

## 2018-06-26 LAB — URINALYSIS, ROUTINE W REFLEX MICROSCOPIC
Bilirubin Urine: NEGATIVE
Glucose, UA: NEGATIVE mg/dL
Ketones, ur: NEGATIVE mg/dL
Nitrite: NEGATIVE
Protein, ur: NEGATIVE mg/dL
Specific Gravity, Urine: 1.009 (ref 1.005–1.030)
pH: 6 (ref 5.0–8.0)

## 2018-06-26 LAB — BASIC METABOLIC PANEL
Anion gap: 8 (ref 5–15)
BUN: <5 mg/dL — ABNORMAL LOW (ref 6–20)
CO2: 29 mmol/L (ref 22–32)
Calcium: 9.2 mg/dL (ref 8.9–10.3)
Chloride: 100 mmol/L (ref 98–111)
Creatinine, Ser: 0.56 mg/dL (ref 0.44–1.00)
GFR calc Af Amer: >60 mL/min (ref >60)
GFR calc non Af Amer: >60 mL/min (ref >60)
Glucose, Bld: 154 mg/dL — ABNORMAL HIGH (ref 70–99)
Potassium: 3.5 mmol/L (ref 3.5–5.1)
Sodium: 137 mmol/L (ref 135–145)

## 2018-06-26 LAB — SARS CORONAVIRUS 2 BY RT PCR (HOSPITAL ORDER, PERFORMED IN ~~LOC~~ HOSPITAL LAB): SARS Coronavirus 2: NEGATIVE

## 2018-06-26 LAB — LACTIC ACID, PLASMA: Lactic Acid, Venous: 1.4 mmol/L (ref 0.5–1.9)

## 2018-06-26 NOTE — Progress Notes (Signed)
PROGRESS NOTE    Ellen [name]  MVH:[id number] DOB: 1960-12-07 DOA: 06/17/2018 PCP: Deborah Chalk, FNP  Brief Narrative:  58 year old morbidly obese female with history of OSA on CPAP, hypertension, dyslipidemia, started having low back pain, tingling numbness in both lower extremities and gait disorder in January/February following a mechanical fall after this she was still able to perform some ADLs with difficulty, unfortunately suffered another fall in mid March and since then has been unable to move both lower extremities she was admitted to [facility] regional hospital and was subsequently referred for physical therapy. -Presented to the ED 4/28 due to worsening low back pain and weakness endorses numbness from umbilicus down. -Neurosurgery consulted due to paraplegia at T8 11-T12 level with diffuse multilevel stenosis, neurosurgery consulted, s/p lumbar decompression, laminectomy, unfortunately due to chronicity of injury has had No neurological improvement yet -Postop course complicated by fever, work-up unrevealing  Assessment & Plan:   Principal Problem:   Back pain Active Problems:   Hyperlipidemia   Essential hypertension   OSA (obstructive sleep apnea)   Hypercalcemia   Severe multilevel spinal stenosis with paraplegia Patient was admitted with paraplegia with complete loss of strength and sensation in both lower extremities with bladder incontinence. Neurosurgery was consulted and she underwent decompressive laminectomy of T10 and T12 on 4/30. Continue with Lyrica and tramadol for pain and PT evaluation recommend SNF for discharge   Fever of unclear etiology Chest x-ray does not show any pneumonia. Urine analysis is negative for infection.  Blood cultures were done and pending so far.  Will get  MRI  Thoracic spine unrevealing.  Novel corona virus PCR is negative times 2.  Neuro surgery suggests no infection at the surgical site.  Discussed with Dr Linus Salmons, no need  for antibiotics at this time as we do not have a source at this time.    Hypercalcemia:  Normalized.    Hyperglycemia:  Resolved.    Hypomagnesemia:  Replaced.    Hypertension: - well controlled.    Depression:  Lexapro.    OSA;  CPAP at night.    Morbid obesity:  Outpatient follow up .      DVT prophylaxis: lovenox.  Code Status: full code Family Communication: none at bedside Disposition Plan: pending further work up..   Consultants:  Dr Roseanne Reno Neuro surgery.    Procedures: laminectomy    Antimicrobials:  none   Subjective: no chest pain, no sob, no dysuria.   Objective: Vitals:   06/26/18 0420 06/26/18 0808 06/26/18 1130 06/26/18 1533  BP: (!) 116/54 116/60 (!) 107/58 (!) 105/52  Pulse: 81 71 75 82  Resp: 18 18 18 18   Temp: 98.1 F (36.7 C) 98.4 F (36.9 C) 98.8 F (37.1 C) 100.3 F (37.9 C)  TempSrc: Oral Oral Oral Oral  SpO2: 100% 100% 100% 100%  Weight:      Height:        Intake/Output Summary (Last 24 hours) at 06/26/2018 1601 Last data filed at 06/26/2018 1414 Gross per 24 hour  Intake 240 ml  Output 1651 ml  Net -1411 ml   Filed Weights   06/19/18 0500 06/19/18 0806 06/23/18 0500  Weight: (!) 172.6 kg (!) 172.6 kg (!) 165.9 kg    Examination:  General exam: Appears calm and comfortable , no distress noted.  Respiratory system: Clear to auscultation. Respiratory effort normal.no wheezing or rhonchi.  Cardiovascular system: S1 & S2 heard, RRR.Marland Kitchen No pedal edema. Gastrointestinal system: Abdomen is nondistended, soft and  nontender.  Normal bowel sounds heard. Central nervous system: Alert and oriented.  Extremities: no pedal edema.  Skin: No rashes, lesions or ulcers Psychiatry:  Mood & affect appropriate.     Data Reviewed: I have personally reviewed following labs and imaging studies  CBC: Recent Labs  Lab 06/20/18 0328 06/21/18 0320 06/24/18 0738 06/25/18 0357  WBC 13.4* 11.2* 5.1 5.6  HGB 10.9* 11.7* 10.3*  11.0*  HCT 32.2* 36.0 31.3* 32.8*  MCV 94.2 97.6 95.1 95.3  PLT 246 246 205 829   Basic Metabolic Panel: Recent Labs  Lab 06/20/18 0544 06/21/18 0320 06/23/18 0500 06/24/18 0738 06/25/18 0357  NA 139 139 139 133* 133*  K 4.0 4.1 3.6 3.5 3.3*  CL 104 102 103 98 95*  CO2 26 26 30 29 28   GLUCOSE 143* 107* 93 98 108*  BUN 10 10 12 11 11   CREATININE 0.58 0.59 0.53 0.54 0.64  CALCIUM 12.5* 12.6* 12.2* 10.4* 9.6   GFR: Estimated Creatinine Clearance: 118.6 mL/min (by C-G formula based on SCr of 0.64 mg/dL). Liver Function Tests: Recent Labs  Lab 06/21/18 0320  AST 18  ALT 15  ALKPHOS 94  BILITOT 0.8  PROT 6.5  ALBUMIN 2.9*   No results for input(s): LIPASE, AMYLASE in the last 168 hours. No results for input(s): AMMONIA in the last 168 hours. Coagulation Profile: No results for input(s): INR, PROTIME in the last 168 hours. Cardiac Enzymes: No results for input(s): CKTOTAL, CKMB, CKMBINDEX, TROPONINI in the last 168 hours. BNP (last 3 results) No results for input(s): PROBNP in the last 8760 hours. HbA1C: No results for input(s): HGBA1C in the last 72 hours. CBG: Recent Labs  Lab 06/23/18 0931  GLUCAP 88   Lipid Profile: No results for input(s): CHOL, HDL, LDLCALC, TRIG, CHOLHDL, LDLDIRECT in the last 72 hours. Thyroid Function Tests: No results for input(s): TSH, T4TOTAL, FREET4, T3FREE, THYROIDAB in the last 72 hours. Anemia Panel: No results for input(s): VITAMINB12, FOLATE, FERRITIN, TIBC, IRON, RETICCTPCT in the last 72 hours. Sepsis Labs: Recent Labs  Lab 06/24/18 1519  PROCALCITON 0.39    Recent Results (from the past 240 hour(s))  MRSA PCR Screening     Status: None   Collection Time: 06/18/18  1:08 AM  Result Value Ref Range Status   MRSA by PCR NEGATIVE NEGATIVE Final    Comment:        The GeneXpert MRSA Assay (FDA approved for NASAL specimens only), is one component of a comprehensive MRSA colonization surveillance program. It is  not intended to diagnose MRSA infection nor to guide or monitor treatment for MRSA infections. Performed at [facility], [place] [address], [place], [place] [address]   Culture, Urine     Status: Abnormal   Collection Time: 06/23/18 12:27 PM  Result Value Ref Range Status   Specimen Description URINE, CLEAN CATCH  Final   Special Requests   Final    NONE Performed at [facility], [place] [address], [place],  [address]    Culture MULTIPLE SPECIES PRESENT, SUGGEST RECOLLECTION (A)  Final   Report Status 06/24/2018 FINAL  Final  Novel Coronavirus, NAA (hospital order; send-out to ref lab)     Status: None   Collection Time: 06/23/18  3:07 PM  Result Value Ref Range Status   SARS-CoV-2, NAA NOT DETECTED NOT DETECTED Final    Comment: (NOTE) This test was developed and its performance characteristics determined by [employer]. This test has not been  FDA cleared or approved. This test has been authorized by FDA under an Emergency Use Authorization (EUA). This test is only authorized for the duration of time the declaration that circumstances exist justifying the authorization of the emergency use of in vitro diagnostic tests for detection of SARS-CoV-2 virus and/or diagnosis of COVID-19 infection under section 564(b)(1) of the Act, 21 U.S.C. 409BDZ-3(G)(9), unless the authorization is terminated or revoked sooner. When diagnostic testing is negative, the possibility of a false negative result should be considered in the context of a patient's recent exposures and the presence of clinical signs and symptoms consistent with COVID-19. An individual without symptoms of COVID-19 and who is not shedding SARS-CoV-2 virus would expect to have a negative (not detected) result in this assay. Performed  At: [facility] [address] [place], [place] [id number] Rush Farmer MD DQ:[id number]    [place]  Final    Comment: Performed  at [facility], [place] [address], [place], [place] [address]  Culture, blood (Routine X 2) w Reflex to ID Panel     Status: None (Preliminary result)   Collection Time: 06/25/18  2:09 PM  Result Value Ref Range Status   Specimen Description BLOOD RIGHT ANTECUBITAL  Final   Special Requests AEROBIC BOTTLE ONLY Blood Culture adequate volume  Final   Culture   Final    NO GROWTH < 24 HOURS Performed at [facility], [place] [address] [place], [place] [address]    Report Status PENDING  Incomplete  Culture, blood (Routine X 2) w Reflex to ID Panel     Status: None (Preliminary result)   Collection Time: 06/25/18  2:09 PM  Result Value Ref Range Status   Specimen Description BLOOD BLOOD RIGHT HAND  Final   Special Requests AEROBIC BOTTLE ONLY Blood Culture adequate volume  Final   Culture   Final    NO GROWTH < 24 HOURS Performed at [facility], [place] [address], [place], [place] [address]    Report Status PENDING  Incomplete  SARS Coronavirus 2 (CEPHEID - Performed in [place] hospital lab), Hosp Order     Status: None   Collection Time: 06/26/18  1:49 PM  Result Value Ref Range Status   SARS Coronavirus 2 NEGATIVE NEGATIVE Final    Comment: (NOTE) If result is NEGATIVE SARS-CoV-2 target nucleic acids are NOT DETECTED. The SARS-CoV-2 RNA is generally detectable in upper and lower  respiratory specimens during the acute phase of infection. The lowest  concentration of SARS-CoV-2 viral copies this assay can detect is 250  copies / mL. A negative result does not preclude SARS-CoV-2 infection  and should not be used as the sole basis for treatment or other  patient management decisions.  A negative result may occur with  improper specimen collection / handling, submission of specimen other  than nasopharyngeal swab, presence of viral mutation(s) within the  areas targeted by this assay, and inadequate number of viral copies  (<250 copies / mL). A negative result must be  combined with clinical  observations, patient history, and epidemiological information. If result is POSITIVE SARS-CoV-2 target nucleic acids are DETECTED. The SARS-CoV-2 RNA is generally detectable in upper and lower  respiratory specimens dur ing the acute phase of infection.  Positive  results are indicative of active infection with SARS-CoV-2.  Clinical  correlation with patient history and other diagnostic information is  necessary to determine patient infection status.  Positive results do  not  rule out bacterial infection or co-infection with other viruses. If result is PRESUMPTIVE POSTIVE SARS-CoV-2 nucleic acids MAY BE PRESENT.   A presumptive positive result was obtained on the submitted specimen  and confirmed on repeat testing.  While 2019 novel coronavirus  (SARS-CoV-2) nucleic acids may be present in the submitted sample  additional confirmatory testing may be necessary for epidemiological  and / or clinical management purposes  to differentiate between  SARS-CoV-2 and other Sarbecovirus currently known to infect humans.  If clinically indicated additional testing with an alternate test  methodology [phone]) is advised. The SARS-CoV-2 RNA is generally  detectable in upper and lower respiratory sp ecimens during the acute  phase of infection. The expected result is Negative. Fact Sheet for Patients:  StrictlyIdeas.no Fact Sheet for Healthcare Providers: BankingDealers.co.za This test is not yet approved or cleared by the [place] FDA and has been authorized for detection and/or diagnosis of SARS-CoV-2 by FDA under an Emergency Use Authorization (EUA).  This EUA will remain in effect (meaning this test can be used) for the duration of the COVID-19 declaration under Section 564(b)(1) of the Act, 21 U.S.C. section 360bbb-3(b)(1), unless the authorization is terminated or revoked sooner. Performed at [place], [place] [address] [place], [place] [address]          Radiology Studies: Mr Thoracic Spine Wo Contrast  Result Date: 06/26/2018 CLINICAL DATA:  Back pain with tingling and numbness of the lower extremities beginning 4 months ago. Patient suffered a fall. Lower thoracic decompression. EXAM: MRI THORACIC SPINE WITHOUT CONTRAST TECHNIQUE: Multiplanar, multisequence MR imaging of the thoracic spine was performed. No intravenous contrast was administered. COMPARISON:  Chest radiography 06/24/2018. Operative images [id number]. Prior MRI 05/16/2018 FINDINGS: Alignment:  Normal Vertebrae: No fracture or primary bone pathology. Interval laminectomies at T10 and T11. Cord: Persistent cord swelling and abnormal T2 signal at the T1 and T2 level. There appears to be slightly less cord T2 signal at the T11 level. Paraspinal and other soft tissues: No operative complications seen. Expected edematous change along the operative approach and laminectomy beds. Disc levels: T1-2: Spinal stenosis due to endplate osteophytes shallow disc protrusion and bilateral facet arthropathy. Abnormal T2 signal in the cord at this level as seen previously. T2-3: Endplate osteophytes and bulging of the disc. Mild facet degeneration. Mild canal stenosis, not as severe as the T1-2 level. T3-4: Mild disc bulge. Mild facet osteoarthritis. No compressive stenosis. T4-5: Moderate disc bulge.  No compressive stenosis. T5-6: No disc abnormality.  Mild facet hypertrophy.  No stenosis. T6-7: Mild disc bulge.  Mild facet hypertrophy.  No stenosis. T7-8: Moderate disc bulge. Mild facet hypertrophy. Mild canal narrowing but no compressive stenosis. T8-9: Mild bulging of the disc. Mild facet hypertrophy. No compressive stenosis. T9-10: Mild bulging of the disc. Mild facet hypertrophy. No compressive stenosis. T10-11: Interval laminectomy. Disc bulge. Good canal decompression. Persistent foraminal stenosis right more than left. T11-12: Bulging of the disc.  Mild facet hypertrophy. Mild canal stenosis without definite cord compression. IMPRESSION: Interval laminectomy at T10-11. Good decompression of the canal at this level. No operative complication. Persistent stenosis at T1-2 due to endplate osteophytes, shallow disc herniation and bilateral facet arthropathy. Abnormal T2 signal in the cord as seen previously. Lesser but potentially significant stenosis also at the T2-3 level. Electronically Signed   By: Nelson Chimes M.D.   On: 06/26/2018 08:16        Scheduled Meds:  atorvastatin  20 mg Oral q1800  docusate sodium  100 mg Oral BID   enalapril  20 mg Oral Daily   enoxaparin (LOVENOX) injection  80 mg Subcutaneous Q24H   escitalopram  10 mg Oral Daily   oxyCODONE-acetaminophen  1 tablet Oral QHS   pantoprazole  40 mg Oral Daily   pregabalin  75 mg Oral BID   senna  1 tablet Oral BID   sodium chloride flush  3 mL Intravenous Q12H   vitamin B-12  1,000 mcg Oral Daily   Vitamin D (Ergocalciferol)  50,000 Units Oral Q Mon   Continuous Infusions:  sodium chloride     sodium chloride 10 mL/hr at 06/20/18 0400   methocarbamol (ROBAXIN) IV       LOS: 9 days    Time spent: 32 minutes    Hosie Poisson, MD [facility] Pager [phone]  If 7PM-7AM, please contact night-coverage [email or web address] Password [email or web address] 06/26/2018, 4:01 PM

## 2018-06-26 NOTE — Progress Notes (Signed)
CSW following for SNF placement. Due to continued fevers, SNF is requesting additional COVID test prior to admission. CSW alerted MD. CSW sent updated therapy notes to SNF to initiate insurance authorization process for hopeful admission to SNF soon.  CSW to follow.  Laveda Abbe, [place] Clinical Social Worker [phone]

## 2018-06-27 DIAGNOSIS — E785 Hyperlipidemia, unspecified: Secondary | ICD-10-CM

## 2018-06-27 MED ORDER — OXYCODONE HCL 5 MG PO TABS: 5.0000 mg | ORAL_TABLET | ORAL | 0 refills | Status: AC | PRN

## 2018-06-27 MED ORDER — SODIUM CHLORIDE 0.9 % IV SOLN
1.0000 g | Freq: Every day | INTRAVENOUS | Status: DC
  Administered 2018-06-27: 1 g via INTRAVENOUS
  Filled 2018-06-27: qty 10

## 2018-06-27 MED ORDER — CEPHALEXIN 500 MG PO CAPS: 500.0000 mg | ORAL_CAPSULE | Freq: Three times a day (TID) | ORAL | 0 refills | Status: AC

## 2018-06-27 MED ORDER — DOCUSATE SODIUM 100 MG PO CAPS: 100.0000 mg | ORAL_CAPSULE | Freq: Two times a day (BID) | ORAL | 0 refills | Status: AC

## 2018-06-27 MED ORDER — OXYCODONE HCL 5 MG PO TABS: 5.0000 mg | ORAL_TABLET | ORAL | 0 refills | Status: DC | PRN

## 2018-06-27 MED ORDER — SENNA 8.6 MG PO TABS: 1.0000 | ORAL_TABLET | Freq: Two times a day (BID) | ORAL | 0 refills | Status: AC

## 2018-06-27 NOTE — Progress Notes (Signed)
Patient refusing to go on CPAP at this time- says she will get RN to call me when she is ready.

## 2018-06-27 NOTE — TOC Transition Note (Signed)
Transition of Care [facility]) - CM/SW Discharge Note   Patient Details  Name: Ellen [name] MRN: [id number] Date of Birth: 11-20-1960  [facility] CM/SW Contact:  Geralynn Ochs, LCSW Phone Number: 06/27/2018, 3:29 PM   Clinical Narrative:  Nurse to call report to [phone], Room 127B     Final next level of care: Skilled Nursing Facility Barriers to Discharge: No Barriers Identified   Patient Goals and CMS Choice Patient states their goals for this hospitalization and ongoing recovery are:: to get back to my home      Discharge Placement              Patient [facility] bed at: [facility] Patient to be transferred to facility by: [place] Name of family member notified: Self Patient and family notified of of transfer: 06/27/18  Discharge Plan and Services In-house Referral: Clinical Social Work Discharge Planning Services: CM Consult                                 Social Determinants of Health (SDOH) Interventions     Readmission Risk Interventions No flowsheet data found.

## 2018-06-27 NOTE — Discharge Summary (Signed)
Physician Discharge Summary  BEATA BEASON FGH:[id number] DOB: [birth year] DOA: 06/17/2018  PCP: Deborah Chalk, FNP  Admit date: 06/17/2018 Discharge date: 06/27/2018  Admitted From: Home.  Disposition:  snf  Recommendations for Outpatient Follow-up:  1. Follow up with PCP in 1-2 weeks 2. Please obtain BMP/CBC in one week Please follow up with neuro surgery as recommended in 3 weeks.   Discharge Condition:stable.  CODE STATUS:FULL CODE.  Diet recommendation: Heart Healthy    Brief/Interim Summary:  58 year old morbidly obese female with history of OSA on CPAP, hypertension, dyslipidemia, started having low back pain, tingling numbness in both lower extremities and gait disorder in January/February following a mechanical fall after this she was still able to perform some ADLs with difficulty, unfortunately suffered another fall in mid March and since then has been unable to move both lower extremities she was admitted to [facility] and was subsequently referred for physical therapy. -Presented to the ED4/28due to worsening low back pain and weakness endorses numbness from umbilicus down. -Neurosurgery consulted due to paraplegia at T8 11-T12 level with diffuse multilevel stenosis, neurosurgery consulted,s/p lumbardecompression,laminectomy,unfortunately due to chronicity of injury has had No neurological improvement yet -Postop course complicated by fever, showed a mild UTI.  Discharge Diagnoses:  Principal Problem:   Back pain Active Problems:   Hyperlipidemia   Essential hypertension   OSA (obstructive sleep apnea)   Hypercalcemia   Severe multilevel spinal stenosis with paraplegia Patient was admitted with paraplegia with complete loss of strength and sensation in both lower extremities with bladder incontinence. Neurosurgery was consulted and she underwent decompressive laminectomy of T10 and T12 on 4/30. Continue with Lyrica and tramadol for pain and  PT evaluation recommend SNF for discharge   Fever, possibly from UTI.  Chest x-ray does not show any pneumonia. Urine analysis shows mild leukocytosis, will start her on rocephin and discharge on oral keflex to complete the course.    Blood cultures were done and negative.    MRI  Thoracic spine unrevealing.  Novel corona virus PCR is negative times 2.  Neuro surgery suggests no infection at the surgical site.     Hypercalcemia:  Normalized.    Hyperglycemia:  Resolved.    Hypomagnesemia:  Replaced.    Hypertension: - well controlled.    Depression:  Lexapro.    OSA;  CPAP at night.    Morbid obesity:  Outpatient follow up .      Discharge Instructions  Discharge Instructions    Diet - low sodium heart healthy   Complete by:  As directed    Discharge instructions   Complete by:  As directed    Please follow up with neuro surgery as recommended.     Allergies as of 06/27/2018      Reactions   Codeine Itching   Only happened one time and did not occur last time patient was in the hospital. (??)      Medication List    STOP taking these medications   celecoxib 100 MG capsule Commonly known as:  CELEBREX   cyclobenzaprine 10 MG tablet Commonly known as:  FLEXERIL   guaiFENesin 600 MG 12 hr tablet Commonly known as:  MUCINEX   Provera 5 MG tablet Generic drug:  medroxyPROGESTERone     TAKE these medications   albuterol 108 (90 Base) MCG/ACT inhaler Commonly known as:  VENTOLIN HFA Inhale 2 puffs into the lungs every 4 (four) hours as needed for wheezing or shortness of breath.  atorvastatin 20 MG tablet Commonly known as:  LIPITOR Take 20 mg by mouth daily at 6 PM.   BL Adult Aspirin Low Strength 81 MG chewable tablet Generic drug:  aspirin Chew 81 mg by mouth daily.   cephALEXin 500 MG capsule Commonly known as:  KEFLEX Take 1 capsule (500 mg total) by mouth 3 (three) times daily for 7 days.   diclofenac sodium  1 % Gel Commonly known as:  VOLTAREN Apply 2-4 g topically every 2 (two) hours as needed (for bilateral knee pain).   docusate sodium 100 MG capsule Commonly known as:  COLACE Take 1 capsule (100 mg total) by mouth 2 (two) times daily.   enalapril 20 MG tablet Commonly known as:  VASOTEC Take 20 mg by mouth daily.   escitalopram 10 MG tablet Commonly known as:  LEXAPRO Take 10 mg by mouth daily.   ipratropium 0.02 % nebulizer solution Commonly known as:  ATROVENT Take 2.5 mLs (0.5 mg total) by nebulization every 6 (six) hours as needed for wheezing or shortness of breath.   levalbuterol 0.63 MG/3ML nebulizer solution Commonly known as:  XOPENEX Take 3 mLs (0.63 mg total) by nebulization every 6 (six) hours as needed for wheezing or shortness of breath.   methocarbamol 500 MG tablet Commonly known as:  ROBAXIN Take 1 tablet (500 mg total) by mouth every 8 (eight) hours as needed for muscle spasms.   omeprazole 20 MG capsule Commonly known as:  PRILOSEC Take 20 mg by mouth daily before breakfast.   oxybutynin 10 MG 24 hr tablet Commonly known as:  DITROPAN-XL Take 10 mg by mouth daily.   oxyCODONE 5 MG immediate release tablet Commonly known as:  Oxy IR/ROXICODONE Take 1-2 tablets (5-10 mg total) by mouth every 4 (four) hours as needed for up to 3 days for severe pain ((score 7 to 10)).   polyethylene glycol powder 17 GM/SCOOP powder Commonly known as:  GLYCOLAX/MIRALAX Take 17 g by mouth daily as needed for mild constipation.   pregabalin 75 MG capsule Commonly known as:  LYRICA Take 75 mg by mouth 3 (three) times daily.   senna 8.6 MG Tabs tablet Commonly known as:  SENOKOT Take 1 tablet (8.6 mg total) by mouth 2 (two) times daily.   traMADol 50 MG tablet Commonly known as:  ULTRAM Take 50 mg by mouth every 6 (six) hours as needed (for pain).   vitamin B-12 1000 MCG tablet Commonly known as:  CYANOCOBALAMIN Take 1,000 mcg by mouth daily.   Vitamin D  (Ergocalciferol) 1.25 MG (50000 UT) Caps capsule Commonly known as:  DRISDOL Take 50,000 Units by mouth every Monday.       Contact information for follow-up providers    Deborah Chalk, FNP. Schedule an appointment as soon as possible for a visit in 1 week(s).   Specialty:  Nurse Practitioner Contact information: [address] [place] [address] [phone]        Traci Sermon, PA-C. Schedule an appointment as soon as possible for a visit in 3 week(s).   Specialty:  Physician Assistant Contact information: [place] [address] [phone]            Contact information for after-discharge care    Destination    [facility] Preferred SNF .   Service:  Skilled Nursing Contact information: [address] [place] [phone]  Allergies  Allergen Reactions  . Codeine Itching    Only happened one time and did not occur last time patient was in the hospital. (??)     Consultations:  neurosurgery   Procedures/Studies: Dg Chest 2 View  Result Date: 06/24/2018 CLINICAL DATA:  Fever.  Recent lumbar surgery. EXAM: CHEST - 2 VIEW COMPARISON:  06/23/2018 FINDINGS: Body habitus reduces diagnostic sensitivity and specificity. Lower thoracic spondylosis. The lungs currently appear clear. No pleural effusion. Heart size within normal limits for AP projection. IMPRESSION: 1. The lungs currently appear clear. 2. Mild thoracic spondylosis. Electronically Signed   By: Van Clines M.D.   On: 06/24/2018 12:18   Dg Chest 2 View  Result Date: 05/30/2018 CLINICAL DATA:  Chest pain EXAM: CHEST - 2 VIEW COMPARISON:  None. FINDINGS: The heart size and mediastinal contours are within normal limits. Both lungs are clear. The visualized skeletal structures are unremarkable. IMPRESSION: No active cardiopulmonary disease. Electronically Signed   By: Kathreen Devoid   On: 05/30/2018  15:48   Dg Thoracolumabar Spine  Result Date: 06/19/2018 CLINICAL DATA:  58 year old female undergoing localization for T10-T11 laminectomy EXAM: THORACOLUMBAR SPINE 1V COMPARISON:  Prior chest x-ray 05/30/2018 FINDINGS: Single intraoperative spot radiograph demonstrates a surgical sponge and metallic probe overlying the T11 level. IMPRESSION: Intraoperative localization radiograph. Electronically Signed   By: Jacqulynn Cadet M.D.   On: 06/19/2018 14:02   Ct Angio Chest Pe W And/or Wo Contrast  Result Date: 05/30/2018 CLINICAL DATA:  Chest pain. EXAM: CT ANGIOGRAPHY CHEST WITH CONTRAST TECHNIQUE: Multidetector CT imaging of the chest was performed using the standard protocol during bolus administration of intravenous contrast. Multiplanar CT image reconstructions and MIPs were obtained to evaluate the vascular anatomy. CONTRAST:  143mL OMNIPAQUE IOHEXOL 350 MG/ML SOLN COMPARISON:  Chest two-view 05/30/2018 FINDINGS: Cardiovascular: Mild cardiac enlargement. No coronary calcification. No pericardial effusion. Negative for pulmonary embolism. Normal thoracic aorta. Mediastinum/Nodes: Negative for mass or adenopathy Lungs/Pleura: Mild diffuse patchy ground-glass density throughout both lungs, symmetrically. No focal consolidation and no effusion. Upper Abdomen: Hepatomegaly with probable fatty infiltration. No acute abnormality Musculoskeletal: Thoracic disc degeneration and spurring. No acute skeletal abnormality. Review of the MIP images confirms the above findings. IMPRESSION: 1. Negative for pulmonary embolism.  No acute abnormality 2. Mild patchy ground-glass density throughout both lungs may be due to expiration. No focal consolidation. Electronically Signed   By: Franchot Gallo M.D.   On: 05/30/2018 18:39   Mr Thoracic Spine Wo Contrast  Result Date: 06/26/2018 CLINICAL DATA:  Back pain with tingling and numbness of the lower extremities beginning 4 months ago. Patient suffered a fall. Lower  thoracic decompression. EXAM: MRI THORACIC SPINE WITHOUT CONTRAST TECHNIQUE: Multiplanar, multisequence MR imaging of the thoracic spine was performed. No intravenous contrast was administered. COMPARISON:  Chest radiography 06/24/2018. Operative images [id number]. Prior MRI 05/16/2018 FINDINGS: Alignment:  Normal Vertebrae: No fracture or primary bone pathology. Interval laminectomies at T10 and T11. Cord: Persistent cord swelling and abnormal T2 signal at the T1 and T2 level. There appears to be slightly less cord T2 signal at the T11 level. Paraspinal and other soft tissues: No operative complications seen. Expected edematous change along the operative approach and laminectomy beds. Disc levels: T1-2: Spinal stenosis due to endplate osteophytes shallow disc protrusion and bilateral facet arthropathy. Abnormal T2 signal in the cord at this level as seen previously. T2-3: Endplate osteophytes and bulging of the disc. Mild facet degeneration. Mild canal stenosis, not as severe as the T1-2 level. T3-4:  Mild disc bulge. Mild facet osteoarthritis. No compressive stenosis. T4-5: Moderate disc bulge.  No compressive stenosis. T5-6: No disc abnormality.  Mild facet hypertrophy.  No stenosis. T6-7: Mild disc bulge.  Mild facet hypertrophy.  No stenosis. T7-8: Moderate disc bulge. Mild facet hypertrophy. Mild canal narrowing but no compressive stenosis. T8-9: Mild bulging of the disc. Mild facet hypertrophy. No compressive stenosis. T9-10: Mild bulging of the disc. Mild facet hypertrophy. No compressive stenosis. T10-11: Interval laminectomy. Disc bulge. Good canal decompression. Persistent foraminal stenosis right more than left. T11-12: Bulging of the disc. Mild facet hypertrophy. Mild canal stenosis without definite cord compression. IMPRESSION: Interval laminectomy at T10-11. Good decompression of the canal at this level. No operative complication. Persistent stenosis at T1-2 due to endplate osteophytes, shallow disc  herniation and bilateral facet arthropathy. Abnormal T2 signal in the cord as seen previously. Lesser but potentially significant stenosis also at the T2-3 level. Electronically Signed   By: Nelson Chimes M.D.   On: 06/26/2018 08:16   Dg Chest Port 1 View  Result Date: 06/23/2018 CLINICAL DATA:  Shortness of breath and fever. EXAM: PORTABLE CHEST 1 VIEW COMPARISON:  05/30/2018; chest CT-05/30/2018 FINDINGS: Examination is degraded due to patient body habitus and portable technique. Grossly unchanged enlarged cardiac silhouette and mediastinal contours given reduced slightly reduced lung volumes. Interval development of scattered bilateral ill-defined nodular opacities. No pleural effusion or pneumothorax. No evidence of edema. No definite acute osseous abnormalities. IMPRESSION: Apparent development of scattered bilateral ill-defined nodular opacities on this body habitus degraded examination. While nonspecific, findings are worrisome for development of atypical infection. Further evaluation with a PA and lateral chest radiograph may be obtained as clinically indicated. Electronically Signed   By: Sandi Mariscal M.D.   On: 06/23/2018 15:07   Dg C-arm 1-60 Min  Result Date: 06/19/2018 CLINICAL DATA:  58 year old female undergoing localization for T10-T11 laminectomy EXAM: THORACOLUMBAR SPINE 1V COMPARISON:  Prior chest x-ray 05/30/2018 FINDINGS: Single intraoperative spot radiograph demonstrates a surgical sponge and metallic probe overlying the T11 level. IMPRESSION: Intraoperative localization radiograph. Electronically Signed   By: Jacqulynn Cadet M.D.   On: 06/19/2018 14:02     Subjective: No chest pain or sob, no cough, nausea, vomiting. Or abdominal pain.  Sensation in the lower extremities improving.   Discharge Exam: Vitals:   06/27/18 0810 06/27/18 1155  BP: 128/62 127/76  Pulse: 72 70  Resp: 15 16  Temp: 99.6 F (37.6 C) 99.5 F (37.5 C)  SpO2: 100% 100%   Vitals:   06/27/18 0014  06/27/18 0436 06/27/18 0810 06/27/18 1155  BP: (!) 116/49 127/64 128/62 127/76  Pulse: 82 79 72 70  Resp: 16 17 15 16   Temp: 98.7 F (37.1 C) 99.4 F (37.4 C) 99.6 F (37.6 C) 99.5 F (37.5 C)  TempSrc: Oral Oral Oral Oral  SpO2: 95% 100% 100% 100%  Weight:      Height:        General: Pt is alert, awake, not in acute distress Cardiovascular: RRR, S1/S2 +, no rubs, no gallops Respiratory: CTA bilaterally, no wheezing, no rhonchi Abdominal: Soft, NT, ND, bowel sounds + Extremities: no edema, no cyanosis    The results of significant diagnostics from this hospitalization (including imaging, microbiology, ancillary and laboratory) are listed below for reference.     Microbiology: Recent Results (from the past 240 hour(s))  MRSA PCR Screening     Status: None   Collection Time: 06/18/18  1:08 AM  Result Value Ref Range Status  MRSA by PCR NEGATIVE NEGATIVE Final    Comment:        The GeneXpert MRSA Assay (FDA approved for NASAL specimens only), is one component of a comprehensive MRSA colonization surveillance program. It is not intended to diagnose MRSA infection nor to guide or monitor treatment for MRSA infections. Performed at [facility], [place] [address], [place], [place] [address]   Culture, Urine     Status: Abnormal   Collection Time: 06/23/18 12:27 PM  Result Value Ref Range Status   Specimen Description URINE, CLEAN CATCH  Final   Special Requests   Final    NONE Performed at [facility], [place] [address] [place], [place] [address]    Culture MULTIPLE SPECIES PRESENT, SUGGEST RECOLLECTION (A)  Final   Report Status 06/24/2018 FINAL  Final  Novel Coronavirus, NAA (hospital order; send-out to ref lab)     Status: None   Collection Time: 06/23/18  3:07 PM  Result Value Ref Range Status   SARS-CoV-2, NAA NOT DETECTED NOT DETECTED Final    Comment: (NOTE) This test was developed and its performance characteristics determined by [employer]. This test has not been FDA cleared or approved. This test has been authorized by FDA under an Emergency Use Authorization (EUA). This test is only authorized for the duration of time the declaration that circumstances exist justifying the authorization of the emergency use of in vitro diagnostic tests for detection of SARS-CoV-2 virus and/or diagnosis of COVID-19 infection under section 564(b)(1) of the Act, 21 U.S.C. 662HUT-6(L)(4), unless the authorization is terminated or revoked sooner. When diagnostic testing is negative, the possibility of a false negative result should be considered in the context of a patient's recent exposures and the presence of clinical signs and symptoms consistent with COVID-19. An individual without symptoms of COVID-19 and who is not shedding SARS-CoV-2 virus would expect to have a negative (not detected) result in this assay. Performed  At: [facility] [address] [place], [place] [id number] Rush Farmer MD CL:[id number]    [place]  Final    Comment: Performed at [facility], [place] [address], [place], [place] [address]  Culture, blood (Routine X 2) w Reflex to ID Panel     Status: None (Preliminary result)   Collection Time: 06/25/18  2:09 PM  Result Value Ref Range Status   Specimen Description BLOOD RIGHT ANTECUBITAL  Final   Special Requests AEROBIC BOTTLE ONLY Blood Culture adequate volume  Final   Culture   Final    NO GROWTH 2 DAYS Performed at [facility], [place] [address] [place], [place] [address]    Report Status PENDING  Incomplete  Culture, blood (Routine X 2) w Reflex to ID Panel     Status: None (Preliminary result)   Collection Time: 06/25/18  2:09 PM  Result Value Ref Range Status   Specimen Description BLOOD BLOOD RIGHT HAND  Final   Special Requests AEROBIC BOTTLE ONLY Blood Culture adequate volume  Final   Culture   Final    NO GROWTH 2 DAYS Performed at [facility], [place] [address] [place],  [address]    Report Status PENDING  Incomplete  SARS Coronavirus 2 (CEPHEID - Performed in [place] hospital lab), Hosp Order     Status: None   Collection Time: 06/26/18  1:49 PM  Result Value Ref Range Status   SARS Coronavirus 2 NEGATIVE NEGATIVE Final  Comment: (NOTE) If result is NEGATIVE SARS-CoV-2 target nucleic acids are NOT DETECTED. The SARS-CoV-2 RNA is generally detectable in upper and lower  respiratory specimens during the acute phase of infection. The lowest  concentration of SARS-CoV-2 viral copies this assay can detect is 250  copies / mL. A negative result does not preclude SARS-CoV-2 infection  and should not be used as the sole basis for treatment or other  patient management decisions.  A negative result may occur with  improper specimen collection / handling, submission of specimen other  than nasopharyngeal swab, presence of viral mutation(s) within the  areas targeted by this assay, and inadequate number of viral copies  (<250 copies / mL). A negative result must be combined with clinical  observations, patient history, and epidemiological information. If result is POSITIVE SARS-CoV-2 target nucleic acids are DETECTED. The SARS-CoV-2 RNA is generally detectable in upper and lower  respiratory specimens dur ing the acute phase of infection.  Positive  results are indicative of active infection with SARS-CoV-2.  Clinical  correlation with patient history and other diagnostic information is  necessary to determine patient infection status.  Positive results do  not rule out bacterial infection or co-infection with other viruses. If result is PRESUMPTIVE POSTIVE SARS-CoV-2 nucleic acids MAY BE PRESENT.   A presumptive positive result was obtained on the submitted specimen  and confirmed on repeat testing.  While 2019 novel coronavirus  (SARS-CoV-2) nucleic acids may be present in the submitted sample   additional confirmatory testing may be necessary for epidemiological  and / or clinical management purposes  to differentiate between  SARS-CoV-2 and other Sarbecovirus currently known to infect humans.  If clinically indicated additional testing with an alternate test  methodology [phone]) is advised. The SARS-CoV-2 RNA is generally  detectable in upper and lower respiratory sp ecimens during the acute  phase of infection. The expected result is Negative. Fact Sheet for Patients:  StrictlyIdeas.no Fact Sheet for Healthcare Providers: BankingDealers.co.za This test is not yet approved or cleared by the [place] FDA and has been authorized for detection and/or diagnosis of SARS-CoV-2 by FDA under an Emergency Use Authorization (EUA).  This EUA will remain in effect (meaning this test can be used) for the duration of the COVID-19 declaration under Section 564(b)(1) of the Act, 21 U.S.C. section 360bbb-3(b)(1), unless the authorization is terminated or revoked sooner. Performed at [facility], [place] [address] [place], [place] [address]      Labs: BNP (last 3 results) No results for input(s): BNP in the last 8760 hours. Basic Metabolic Panel: Recent Labs  Lab 06/21/18 0320 06/23/18 0500 06/24/18 0738 06/25/18 0357 06/26/18 1514  NA 139 139 133* 133* 137  K 4.1 3.6 3.5 3.3* 3.5  CL 102 103 98 95* 100  CO2 26 30 29 28 29   GLUCOSE 107* 93 98 108* 154*  BUN 10 12 11 11  <5*  CREATININE 0.59 0.53 0.54 0.64 0.56  CALCIUM 12.6* 12.2* 10.4* 9.6 9.2   Liver Function Tests: Recent Labs  Lab 06/21/18 0320  AST 18  ALT 15  ALKPHOS 94  BILITOT 0.8  PROT 6.5  ALBUMIN 2.9*   No results for input(s): LIPASE, AMYLASE in the last 168 hours. No results for input(s): AMMONIA in the last 168 hours. CBC: Recent Labs  Lab 06/21/18 0320 06/24/18 0738 06/25/18 0357  WBC 11.2* 5.1 5.6  HGB 11.7* 10.3* 11.0*  HCT 36.0 31.3*  32.8*  MCV 97.6 95.1 95.3  PLT  246 205 202   Cardiac Enzymes: No results for input(s): CKTOTAL, CKMB, CKMBINDEX, TROPONINI in the last 168 hours. BNP: Invalid input(s): POCBNP CBG: Recent Labs  Lab 06/23/18 0931  GLUCAP 88   D-Dimer No results for input(s): DDIMER in the last 72 hours. Hgb A1c No results for input(s): HGBA1C in the last 72 hours. Lipid Profile No results for input(s): CHOL, HDL, LDLCALC, TRIG, CHOLHDL, LDLDIRECT in the last 72 hours. Thyroid function studies No results for input(s): TSH, T4TOTAL, T3FREE, THYROIDAB in the last 72 hours.  Invalid input(s): FREET3 Anemia work up No results for input(s): VITAMINB12, FOLATE, FERRITIN, TIBC, IRON, RETICCTPCT in the last 72 hours. Urinalysis    Component Value Date/Time   COLORURINE YELLOW 06/26/2018 1614   APPEARANCEUR HAZY (A) 06/26/2018 1614   LABSPEC 1.009 06/26/2018 1614   PHURINE 6.0 06/26/2018 1614   GLUCOSEU NEGATIVE 06/26/2018 1614   GLUCOSEU NEG mg/dL 08/05/2006 2022   HGBUR MODERATE (A) 06/26/2018 1614   BILIRUBINUR NEGATIVE 06/26/2018 1614   KETONESUR NEGATIVE 06/26/2018 1614   PROTEINUR NEGATIVE 06/26/2018 1614   UROBILINOGEN 1.0 06/14/2011 1828   NITRITE NEGATIVE 06/26/2018 1614   LEUKOCYTESUR SMALL (A) 06/26/2018 1614   Sepsis Labs Invalid input(s): PROCALCITONIN,  WBC,  LACTICIDVEN Microbiology Recent Results (from the past 240 hour(s))  MRSA PCR Screening     Status: None   Collection Time: 06/18/18  1:08 AM  Result Value Ref Range Status   MRSA by PCR NEGATIVE NEGATIVE Final    Comment:        The GeneXpert MRSA Assay (FDA approved for NASAL specimens only), is one component of a comprehensive MRSA colonization surveillance program. It is not intended to diagnose MRSA infection nor to guide or monitor treatment for MRSA infections. Performed at [facility], [place] [address], [place], [place] [address]   Culture, Urine     Status: Abnormal   Collection Time: 06/23/18  12:27 PM  Result Value Ref Range Status   Specimen Description URINE, CLEAN CATCH  Final   Special Requests   Final    NONE Performed at [facility], [place] [address] [place], [place] [address]    Culture MULTIPLE SPECIES PRESENT, SUGGEST RECOLLECTION (A)  Final   Report Status 06/24/2018 FINAL  Final  Novel Coronavirus, NAA (hospital order; send-out to ref lab)     Status: None   Collection Time: 06/23/18  3:07 PM  Result Value Ref Range Status   SARS-CoV-2, NAA NOT DETECTED NOT DETECTED Final    Comment: (NOTE) This test was developed and its performance characteristics determined by [employer]. This test has not been FDA cleared or approved. This test has been authorized by FDA under an Emergency Use Authorization (EUA). This test is only authorized for the duration of time the declaration that circumstances exist justifying the authorization of the emergency use of in vitro diagnostic tests for detection of SARS-CoV-2 virus and/or diagnosis of COVID-19 infection under section 564(b)(1) of the Act, 21 U.S.C. 098JXB-1(Y)(7), unless the authorization is terminated or revoked sooner. When diagnostic testing is negative, the possibility of a false negative result should be considered in the context of a patient's recent exposures and the presence of clinical signs and symptoms consistent with COVID-19. An individual without symptoms of COVID-19 and who is not shedding SARS-CoV-2 virus would expect to have a negative (not detected) result in this assay. Performed  At: [facility] [place], [place] [id number] Rush Farmer MD [id number]  Coronavirus Source NASOPHARYNGEAL  Final    Comment: Performed at [facility], [place] [address] [place], [place] [address]  Culture, blood (Routine X 2) w Reflex to ID Panel     Status: None (Preliminary result)   Collection Time: 06/25/18  2:09 PM  Result Value Ref Range Status   Specimen  Description BLOOD RIGHT ANTECUBITAL  Final   Special Requests AEROBIC BOTTLE ONLY Blood Culture adequate volume  Final   Culture   Final    NO GROWTH 2 DAYS Performed at [facility], [place] [address], [place], [place] [address]    Report Status PENDING  Incomplete  Culture, blood (Routine X 2) w Reflex to ID Panel     Status: None (Preliminary result)   Collection Time: 06/25/18  2:09 PM  Result Value Ref Range Status   Specimen Description BLOOD BLOOD RIGHT HAND  Final   Special Requests AEROBIC BOTTLE ONLY Blood Culture adequate volume  Final   Culture   Final    NO GROWTH 2 DAYS Performed at [facility], [place] [address] [place], [place] [address]    Report Status PENDING  Incomplete  SARS Coronavirus 2 (CEPHEID - Performed in [place] hospital lab), Hosp Order     Status: None   Collection Time: 06/26/18  1:49 PM  Result Value Ref Range Status   SARS Coronavirus 2 NEGATIVE NEGATIVE Final    Comment: (NOTE) If result is NEGATIVE SARS-CoV-2 target nucleic acids are NOT DETECTED. The SARS-CoV-2 RNA is generally detectable in upper and lower  respiratory specimens during the acute phase of infection. The lowest  concentration of SARS-CoV-2 viral copies this assay can detect is 250  copies / mL. A negative result does not preclude SARS-CoV-2 infection  and should not be used as the sole basis for treatment or other  patient management decisions.  A negative result may occur with  improper specimen collection / handling, submission of specimen other  than nasopharyngeal swab, presence of viral mutation(s) within the  areas targeted by this assay, and inadequate number of viral copies  (<250 copies / mL). A negative result must be combined with clinical  observations, patient history, and epidemiological information. If result is POSITIVE SARS-CoV-2 target nucleic acids are DETECTED. The SARS-CoV-2 RNA is generally detectable in upper and lower  respiratory specimens  dur ing the acute phase of infection.  Positive  results are indicative of active infection with SARS-CoV-2.  Clinical  correlation with patient history and other diagnostic information is  necessary to determine patient infection status.  Positive results do  not rule out bacterial infection or co-infection with other viruses. If result is PRESUMPTIVE POSTIVE SARS-CoV-2 nucleic acids MAY BE PRESENT.   A presumptive positive result was obtained on the submitted specimen  and confirmed on repeat testing.  While 2019 novel coronavirus  (SARS-CoV-2) nucleic acids may be present in the submitted sample  additional confirmatory testing may be necessary for epidemiological  and / or clinical management purposes  to differentiate between  SARS-CoV-2 and other Sarbecovirus currently known to infect humans.  If clinically indicated additional testing with an alternate test  methodology [phone]) is advised. The SARS-CoV-2 RNA is generally  detectable in upper and lower respiratory sp ecimens during the acute  phase of infection. The expected result is Negative. Fact Sheet for Patients:  StrictlyIdeas.no Fact Sheet for Healthcare Providers: BankingDealers.co.za This test is not yet approved or cleared by the [place] FDA and  has been authorized for detection and/or diagnosis of SARS-CoV-2 by FDA under an Emergency Use Authorization (EUA).  This EUA will remain in effect (meaning this test can be used) for the duration of the COVID-19 declaration under Section 564(b)(1) of the Act, 21 U.S.C. section 360bbb-3(b)(1), unless the authorization is terminated or revoked sooner. Performed at [facility], [place] [address], [place], [place] [address]      Time coordinating discharge: 34 minutes  SIGNED:   Hosie Poisson, MD  [facility] 06/27/2018, 1:36 PM Pager   If 7PM-7AM, please contact night-coverage [email or web address] Password  [email or web address]

## 2018-06-28 LAB — URINE CULTURE

## 2018-06-30 LAB — CULTURE, BLOOD (ROUTINE X 2)
Culture: NO GROWTH
Culture: NO GROWTH
Special Requests: ADEQUATE
Special Requests: ADEQUATE

## 2018-08-08 DIAGNOSIS — E785 Hyperlipidemia, unspecified: Secondary | ICD-10-CM | POA: Diagnosis present

## 2018-08-08 DIAGNOSIS — Z20828 Contact with and (suspected) exposure to other viral communicable diseases: Secondary | ICD-10-CM | POA: Diagnosis present

## 2018-08-08 DIAGNOSIS — I10 Essential (primary) hypertension: Secondary | ICD-10-CM | POA: Diagnosis present

## 2018-08-08 DIAGNOSIS — Z833 Family history of diabetes mellitus: Secondary | ICD-10-CM

## 2018-08-08 DIAGNOSIS — G4733 Obstructive sleep apnea (adult) (pediatric): Secondary | ICD-10-CM | POA: Diagnosis present

## 2018-08-08 DIAGNOSIS — M1990 Unspecified osteoarthritis, unspecified site: Secondary | ICD-10-CM | POA: Diagnosis present

## 2018-08-08 DIAGNOSIS — Z7982 Long term (current) use of aspirin: Secondary | ICD-10-CM

## 2018-08-08 DIAGNOSIS — Z8249 Family history of ischemic heart disease and other diseases of the circulatory system: Secondary | ICD-10-CM

## 2018-08-08 DIAGNOSIS — I447 Left bundle-branch block, unspecified: Secondary | ICD-10-CM | POA: Diagnosis present

## 2018-08-08 DIAGNOSIS — G8220 Paraplegia, unspecified: Secondary | ICD-10-CM | POA: Diagnosis present

## 2018-08-08 DIAGNOSIS — R079 Chest pain, unspecified: Secondary | ICD-10-CM | POA: Diagnosis not present

## 2018-08-08 DIAGNOSIS — Z7401 Bed confinement status: Secondary | ICD-10-CM

## 2018-08-08 DIAGNOSIS — E119 Type 2 diabetes mellitus without complications: Secondary | ICD-10-CM | POA: Diagnosis present

## 2018-08-08 DIAGNOSIS — Z9119 Patient's noncompliance with other medical treatment and regimen: Secondary | ICD-10-CM

## 2018-08-08 DIAGNOSIS — I248 Other forms of acute ischemic heart disease: Secondary | ICD-10-CM | POA: Diagnosis not present

## 2018-08-08 DIAGNOSIS — E7800 Pure hypercholesterolemia, unspecified: Secondary | ICD-10-CM | POA: Diagnosis present

## 2018-08-08 DIAGNOSIS — K5909 Other constipation: Secondary | ICD-10-CM | POA: Diagnosis present

## 2018-08-08 DIAGNOSIS — N3000 Acute cystitis without hematuria: Secondary | ICD-10-CM | POA: Diagnosis present

## 2018-08-08 DIAGNOSIS — K219 Gastro-esophageal reflux disease without esophagitis: Secondary | ICD-10-CM | POA: Diagnosis present

## 2018-08-08 DIAGNOSIS — N319 Neuromuscular dysfunction of bladder, unspecified: Secondary | ICD-10-CM | POA: Diagnosis present

## 2018-08-08 DIAGNOSIS — Z885 Allergy status to narcotic agent status: Secondary | ICD-10-CM

## 2018-08-08 DIAGNOSIS — Z809 Family history of malignant neoplasm, unspecified: Secondary | ICD-10-CM

## 2018-08-08 DIAGNOSIS — F329 Major depressive disorder, single episode, unspecified: Secondary | ICD-10-CM | POA: Diagnosis present

## 2018-08-08 DIAGNOSIS — Z6841 Body Mass Index (BMI) 40.0 and over, adult: Secondary | ICD-10-CM

## 2018-08-09 ENCOUNTER — Inpatient Hospital Stay (HOSPITAL_COMMUNITY)
Admission: EM | Admit: 2018-08-09 | Discharge: 2018-08-14 | DRG: 311 | Disposition: A | Discharge status: Home Health | Payer: Medicare HMO | Attending: Internal Medicine | Admitting: Internal Medicine

## 2018-08-09 ENCOUNTER — Emergency Department (HOSPITAL_COMMUNITY): Payer: Medicare HMO

## 2018-08-09 ENCOUNTER — Other Ambulatory Visit: Payer: Self-pay

## 2018-08-09 ENCOUNTER — Encounter (HOSPITAL_COMMUNITY): Payer: Self-pay | Admitting: Emergency Medicine

## 2018-08-09 DIAGNOSIS — G8220 Paraplegia, unspecified: Secondary | ICD-10-CM | POA: Diagnosis present

## 2018-08-09 DIAGNOSIS — E782 Mixed hyperlipidemia: Secondary | ICD-10-CM

## 2018-08-09 DIAGNOSIS — R079 Chest pain, unspecified: Secondary | ICD-10-CM | POA: Diagnosis not present

## 2018-08-09 DIAGNOSIS — I10 Essential (primary) hypertension: Secondary | ICD-10-CM | POA: Diagnosis present

## 2018-08-09 DIAGNOSIS — R338 Other retention of urine: Secondary | ICD-10-CM | POA: Diagnosis present

## 2018-08-09 DIAGNOSIS — R0789 Other chest pain: Secondary | ICD-10-CM | POA: Diagnosis present

## 2018-08-09 DIAGNOSIS — E785 Hyperlipidemia, unspecified: Secondary | ICD-10-CM | POA: Diagnosis present

## 2018-08-09 DIAGNOSIS — K219 Gastro-esophageal reflux disease without esophagitis: Secondary | ICD-10-CM | POA: Diagnosis present

## 2018-08-09 DIAGNOSIS — G4733 Obstructive sleep apnea (adult) (pediatric): Secondary | ICD-10-CM | POA: Diagnosis present

## 2018-08-09 DIAGNOSIS — N390 Urinary tract infection, site not specified: Secondary | ICD-10-CM | POA: Diagnosis present

## 2018-08-09 LAB — TROPONIN I
Troponin I: 0.08 ng/mL (ref <0.03)
Troponin I: 0.08 ng/mL (ref <0.03)
Troponin I: 0.07 ng/mL (ref <0.03)
Troponin I: 0.07 ng/mL (ref <0.03)

## 2018-08-09 LAB — URINALYSIS, ROUTINE W REFLEX MICROSCOPIC
Bilirubin Urine: NEGATIVE
Glucose, UA: NEGATIVE mg/dL
Ketones, ur: NEGATIVE mg/dL
Nitrite: NEGATIVE
Protein, ur: 30 mg/dL — AB
Specific Gravity, Urine: 1.041 — ABNORMAL HIGH (ref 1.005–1.030)
pH: 6 (ref 5.0–8.0)

## 2018-08-09 LAB — CBC WITH DIFFERENTIAL/PLATELET
Abs Immature Granulocytes: 0.02 10*3/uL (ref 0.00–0.07)
Basophils Absolute: 0 10*3/uL (ref 0.0–0.1)
Basophils Relative: 0 %
Eosinophils Absolute: 0.2 10*3/uL (ref 0.0–0.5)
Eosinophils Relative: 3 %
HCT: 43.2 % (ref 36.0–46.0)
Hemoglobin: 13.6 g/dL (ref 12.0–15.0)
Immature Granulocytes: 0 %
Lymphocytes Relative: 37 %
Lymphs Abs: 3.1 10*3/uL (ref 0.7–4.0)
MCH: 29.9 pg (ref 26.0–34.0)
MCHC: 31.5 g/dL (ref 30.0–36.0)
MCV: 94.9 fL (ref 80.0–100.0)
Monocytes Absolute: 0.8 10*3/uL (ref 0.1–1.0)
Monocytes Relative: 9 %
Neutro Abs: 4.2 10*3/uL (ref 1.7–7.7)
Neutrophils Relative %: 51 %
Platelets: 309 10*3/uL (ref 150–400)
RBC: 4.55 MIL/uL (ref 3.87–5.11)
RDW: 11.8 % (ref 11.5–15.5)
WBC: 8.4 10*3/uL (ref 4.0–10.5)
nRBC: 0 % (ref 0.0–0.2)

## 2018-08-09 LAB — CBC
HCT: 40.1 % (ref 36.0–46.0)
Hemoglobin: 12.9 g/dL (ref 12.0–15.0)
MCH: 30.4 pg (ref 26.0–34.0)
MCHC: 32.2 g/dL (ref 30.0–36.0)
MCV: 94.4 fL (ref 80.0–100.0)
Platelets: 288 10*3/uL (ref 150–400)
RBC: 4.25 MIL/uL (ref 3.87–5.11)
RDW: 11.9 % (ref 11.5–15.5)
WBC: 7 10*3/uL (ref 4.0–10.5)
nRBC: 0 % (ref 0.0–0.2)

## 2018-08-09 LAB — COMPREHENSIVE METABOLIC PANEL
ALT: 11 U/L (ref 0–44)
AST: 16 U/L (ref 15–41)
Albumin: 3.4 g/dL — ABNORMAL LOW (ref 3.5–5.0)
Alkaline Phosphatase: 96 U/L (ref 38–126)
Anion gap: 12 (ref 5–15)
BUN: 11 mg/dL (ref 6–20)
CO2: 22 mmol/L (ref 22–32)
Calcium: 11.8 mg/dL — ABNORMAL HIGH (ref 8.9–10.3)
Chloride: 103 mmol/L (ref 98–111)
Creatinine, Ser: 0.56 mg/dL (ref 0.44–1.00)
GFR calc Af Amer: >60 mL/min (ref >60)
GFR calc non Af Amer: >60 mL/min (ref >60)
Glucose, Bld: 112 mg/dL — ABNORMAL HIGH (ref 70–99)
Potassium: 3.9 mmol/L (ref 3.5–5.1)
Sodium: 137 mmol/L (ref 135–145)
Total Bilirubin: 0.5 mg/dL (ref 0.3–1.2)
Total Protein: 7.2 g/dL (ref 6.5–8.1)

## 2018-08-09 LAB — URINALYSIS, MICROSCOPIC (REFLEX): WBC, UA: >50 WBC/hpf (ref 0–5)

## 2018-08-09 LAB — CREATININE, SERUM
Creatinine, Ser: 0.47 mg/dL (ref 0.44–1.00)
GFR calc Af Amer: >60 mL/min (ref >60)
GFR calc non Af Amer: >60 mL/min (ref >60)

## 2018-08-09 LAB — BASIC METABOLIC PANEL
Anion gap: 10 (ref 5–15)
BUN: 9 mg/dL (ref 6–20)
CO2: 23 mmol/L (ref 22–32)
Calcium: 11.5 mg/dL — ABNORMAL HIGH (ref 8.9–10.3)
Chloride: 107 mmol/L (ref 98–111)
Creatinine, Ser: 0.45 mg/dL (ref 0.44–1.00)
GFR calc Af Amer: >60 mL/min (ref >60)
GFR calc non Af Amer: >60 mL/min (ref >60)
Glucose, Bld: 95 mg/dL (ref 70–99)
Potassium: 4.1 mmol/L (ref 3.5–5.1)
Sodium: 140 mmol/L (ref 135–145)

## 2018-08-09 LAB — D-DIMER, QUANTITATIVE: D-Dimer, Quant: 1.86 ug/mL-FEU — ABNORMAL HIGH (ref 0.00–0.50)

## 2018-08-09 MED ORDER — FENTANYL CITRATE (PF) 100 MCG/2ML IJ SOLN
100.0000 ug | Freq: Once | INTRAMUSCULAR | Status: AC
  Administered 2018-08-09: 100 ug via INTRAVENOUS
  Filled 2018-08-09: qty 2

## 2018-08-09 MED ORDER — ENOXAPARIN SODIUM 80 MG/0.8ML ~~LOC~~ SOLN
0.5000 mg/kg | SUBCUTANEOUS | Status: DC
  Administered 2018-08-09 – 2018-08-14 (×6): 75 mg via SUBCUTANEOUS
  Filled 2018-08-09 (×6): qty 0.8

## 2018-08-09 MED ORDER — OXYBUTYNIN CHLORIDE ER 10 MG PO TB24
10.0000 mg | ORAL_TABLET | Freq: Every day | ORAL | Status: DC
  Administered 2018-08-09: 10 mg via ORAL
  Filled 2018-08-09: qty 1

## 2018-08-09 MED ORDER — VITAMIN B-12 1000 MCG PO TABS
1000.0000 ug | ORAL_TABLET | Freq: Every day | ORAL | Status: DC
  Administered 2018-08-09 – 2018-08-14 (×6): 1000 ug via ORAL
  Filled 2018-08-09 (×6): qty 1

## 2018-08-09 MED ORDER — SENNA 8.6 MG PO TABS
1.0000 | ORAL_TABLET | Freq: Two times a day (BID) | ORAL | Status: DC
  Administered 2018-08-09 – 2018-08-14 (×10): 8.6 mg via ORAL
  Filled 2018-08-09 (×11): qty 1

## 2018-08-09 MED ORDER — ESCITALOPRAM OXALATE 10 MG PO TABS
10.0000 mg | ORAL_TABLET | Freq: Every day | ORAL | Status: DC
  Administered 2018-08-09 – 2018-08-14 (×6): 10 mg via ORAL
  Filled 2018-08-09 (×6): qty 1

## 2018-08-09 MED ORDER — BACLOFEN 5 MG HALF TABLET
5.0000 mg | ORAL_TABLET | Freq: Three times a day (TID) | ORAL | Status: DC | PRN
  Administered 2018-08-09 – 2018-08-13 (×6): 5 mg via ORAL
  Filled 2018-08-09 (×8): qty 1

## 2018-08-09 MED ORDER — ONDANSETRON HCL 4 MG/2ML IJ SOLN: 4.0000 mg | Freq: Four times a day (QID) | INTRAMUSCULAR | Status: DC | PRN

## 2018-08-09 MED ORDER — PREGABALIN 75 MG PO CAPS
75.0000 mg | ORAL_CAPSULE | Freq: Three times a day (TID) | ORAL | Status: DC
  Administered 2018-08-09 – 2018-08-14 (×16): 75 mg via ORAL
  Filled 2018-08-09 (×16): qty 1

## 2018-08-09 MED ORDER — ENOXAPARIN SODIUM 150 MG/ML ~~LOC~~ SOLN
1.0000 mg/kg | Freq: Two times a day (BID) | SUBCUTANEOUS | Status: DC
  Filled 2018-08-09: qty 1

## 2018-08-09 MED ORDER — DICLOFENAC SODIUM 1 % TD GEL
2.0000 g | TRANSDERMAL | Status: DC | PRN
  Filled 2018-08-09: qty 100

## 2018-08-09 MED ORDER — ENALAPRIL MALEATE 10 MG PO TABS
20.0000 mg | ORAL_TABLET | Freq: Every day | ORAL | Status: DC
  Administered 2018-08-09 – 2018-08-14 (×6): 20 mg via ORAL
  Filled 2018-08-09 (×6): qty 2

## 2018-08-09 MED ORDER — ATORVASTATIN CALCIUM 10 MG PO TABS
20.0000 mg | ORAL_TABLET | Freq: Every day | ORAL | Status: DC
  Administered 2018-08-09 – 2018-08-13 (×5): 20 mg via ORAL
  Filled 2018-08-09 (×5): qty 2

## 2018-08-09 MED ORDER — TRAMADOL HCL 50 MG PO TABS
50.0000 mg | ORAL_TABLET | Freq: Four times a day (QID) | ORAL | Status: DC | PRN
  Administered 2018-08-09 – 2018-08-14 (×16): 50 mg via ORAL
  Filled 2018-08-09 (×16): qty 1

## 2018-08-09 MED ORDER — ASPIRIN 81 MG PO CHEW
81.0000 mg | CHEWABLE_TABLET | Freq: Every day | ORAL | Status: DC
  Administered 2018-08-09 – 2018-08-14 (×6): 81 mg via ORAL
  Filled 2018-08-09 (×6): qty 1

## 2018-08-09 MED ORDER — IOHEXOL 300 MG/ML  SOLN
100.0000 mL | Freq: Once | INTRAMUSCULAR | Status: AC | PRN
  Administered 2018-08-09: 100 mL via INTRAVENOUS

## 2018-08-09 MED ORDER — FENTANYL CITRATE (PF) 100 MCG/2ML IJ SOLN
50.0000 ug | Freq: Once | INTRAMUSCULAR | Status: AC
  Administered 2018-08-09: 50 ug via INTRAVENOUS
  Filled 2018-08-09: qty 2

## 2018-08-09 MED ORDER — SODIUM CHLORIDE 0.9 % IV SOLN
1.0000 g | INTRAVENOUS | Status: DC
  Administered 2018-08-09 – 2018-08-13 (×5): 1 g via INTRAVENOUS
  Filled 2018-08-09: qty 10
  Filled 2018-08-09: qty 1
  Filled 2018-08-09: qty 10
  Filled 2018-08-09 (×3): qty 1

## 2018-08-09 MED ORDER — POLYETHYLENE GLYCOL 3350 17 G PO PACK
17.0000 g | PACK | Freq: Every day | ORAL | Status: DC | PRN
  Administered 2018-08-13: 17 g via ORAL
  Filled 2018-08-09: qty 1

## 2018-08-09 MED ORDER — VITAMIN D (ERGOCALCIFEROL) 1.25 MG (50000 UNIT) PO CAPS: 50000.0000 [IU] | ORAL_CAPSULE | ORAL | Status: DC

## 2018-08-09 MED ORDER — ACETAMINOPHEN 325 MG PO TABS
650.0000 mg | ORAL_TABLET | ORAL | Status: DC | PRN
  Administered 2018-08-09 – 2018-08-13 (×6): 650 mg via ORAL
  Filled 2018-08-09 (×6): qty 2

## 2018-08-09 MED ORDER — DOCUSATE SODIUM 100 MG PO CAPS
100.0000 mg | ORAL_CAPSULE | Freq: Two times a day (BID) | ORAL | Status: DC
  Administered 2018-08-09 – 2018-08-14 (×9): 100 mg via ORAL
  Filled 2018-08-09 (×11): qty 1

## 2018-08-09 MED ORDER — PANTOPRAZOLE SODIUM 40 MG PO TBEC
40.0000 mg | DELAYED_RELEASE_TABLET | Freq: Every day | ORAL | Status: DC
  Administered 2018-08-09 – 2018-08-14 (×6): 40 mg via ORAL
  Filled 2018-08-09 (×6): qty 1

## 2018-08-09 NOTE — Progress Notes (Signed)
Bladder scan 781 cc after 100 cc of urine from bed pan, paged doctor got order for in and out cath

## 2018-08-09 NOTE — Consult Note (Signed)
CONSULTATION NOTE   Patient Name: Ellen [name] Date of Encounter: 08/09/2018 Cardiologist: No primary care provider on file.  Chief Complaint   Chest pain, leg spasm  Patient Profile   58 yo female with HTN, HLD, morbid obesity, OSA on CPAP and GERD, presents with chest pain.  HPI   Ellen [name] is a 58 y.o. female who is being seen today for the evaluation of chest pain at the request of Dr. Algis Liming. This is a 58 yo female with HTN, HLD, morbid obesity, GERD and OSA on CPAP, who presented early this morning for chest pain.  She is also reportedly paraplegic (non-ambulatory since 2009 back surgery) and has been undergoing rehabilitation. She presented with atypical chest pain which has been reproducible, worse with movement and positioning. Her troponin, however, was noted to be mildly elevated at 0.08, 0.08 and 0.07 with a flat trend. CT angio of the chest was performed and rule-out PE with small airway disease finding, no coronary calcium. EKG shows sinus rhythm without ischemic changes. She was also noted to have urinary retention today with 900 cc retained urine on in-and-out cath. UA also suggests possible UTI - large leukocytes, many bacteria noted. This could be a cause of her spasm and chest pain. Her COVID-19 test remains pending. Previous echo in 2019 showed LVEF 65-70% with grade 1 DD and normal wall motion. Repeat echo is pending.  PMHx   Past Medical History:  Diagnosis Date  . Bronchitis    hx of  . Degenerative joint disease   . Depression   . ENDOMETRIAL POLYP 01/08/2006   Dr. Agnes Lawrence.  2001:  D&C, benign;  07/14/10:  D&C, hysteroscopy, polypectomy and bx of mons pubis (peau d'orange)...... 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  Marland Kitchen GERD (gastroesophageal reflux disease)   . Hernia   . High cholesterol   . Hypertension   . Lipoma   . Obesity   . Sleep apnea    uses cpap    Past Surgical History:  Procedure Laterality  Date  . CHOLECYSTECTOMY    . DILATION AND CURETTAGE OF UTERUS    . LUMBAR LAMINECTOMY/DECOMPRESSION MICRODISCECTOMY N/A 06/19/2018   Procedure: THORACIC TEN-THORACIC ELEVEN  LAMINECTOMY;  Surgeon: Consuella Lose, MD;  Location: [place];  Service: Neurosurgery;  Laterality: N/A;  posterior  . resection of lipoma    . UMBILICAL HERNIA REPAIR      FAMHx   Family History  Problem Relation Age of Onset  . Heart disease Mother   . Diabetes Mother   . Cancer Father     SOCHx    reports that she has never smoked. She has never used smokeless tobacco. She reports that she does not drink alcohol or use drugs.  Outpatient Medications   No current facility-administered medications on file prior to encounter.    Current Outpatient Medications on File Prior to Encounter  Medication Sig Dispense Refill  . albuterol (PROVENTIL HFA;VENTOLIN HFA) 108 (90 Base) MCG/ACT inhaler Inhale 2 puffs into the lungs every 4 (four) hours as needed for wheezing or shortness of breath. 1 Inhaler 0  . amLODipine (NORVASC) 5 MG tablet Take 5 mg by mouth daily.    Marland Kitchen aspirin (BL ADULT ASPIRIN LOW STRENGTH) 81 MG chewable tablet Chew 81 mg by mouth daily.      Marland Kitchen atorvastatin (LIPITOR) 20 MG tablet Take 20 mg by mouth daily at 6 PM.   1  . diclofenac sodium (VOLTAREN) 1 % GEL  Apply 2-4 g topically every 2 (two) hours as needed (for bilateral knee pain).    Marland Kitchen docusate sodium (COLACE) 100 MG capsule Take 1 capsule (100 mg total) by mouth 2 (two) times daily. 10 capsule 0  . enalapril (VASOTEC) 20 MG tablet Take 40 mg by mouth daily.     Marland Kitchen escitalopram (LEXAPRO) 10 MG tablet Take 10 mg by mouth daily.    . ferrous sulfate 325 (65 FE) MG tablet Take 325 mg by mouth daily with breakfast.    . ipratropium (ATROVENT) 0.02 % nebulizer solution Take 2.5 mLs (0.5 mg total) by nebulization every 6 (six) hours as needed for wheezing or shortness of breath. 75 mL 0  . levalbuterol (XOPENEX) 0.63 MG/3ML nebulizer solution Take 3  mLs (0.63 mg total) by nebulization every 6 (six) hours as needed for wheezing or shortness of breath. 3 mL 0  . medroxyPROGESTERone (PROVERA) 5 MG tablet Take 5 mg by mouth daily.    Marland Kitchen omeprazole (PRILOSEC) 20 MG capsule Take 20 mg by mouth daily before breakfast.    . oxybutynin (DITROPAN-XL) 10 MG 24 hr tablet Take 10 mg by mouth daily.  8  . oxyCODONE (OXY IR/ROXICODONE) 5 MG immediate release tablet Take 10 mg by mouth every 6 (six) hours as needed for pain.    . polyethylene glycol powder (GLYCOLAX/MIRALAX) 17 GM/SCOOP powder Take 17 g by mouth daily as needed for mild constipation.     . pregabalin (LYRICA) 75 MG capsule Take 100 mg by mouth 3 (three) times daily.     Marland Kitchen senna (SENOKOT) 8.6 MG TABS tablet Take 1 tablet (8.6 mg total) by mouth 2 (two) times daily. 120 each 0  . traMADol (ULTRAM) 50 MG tablet Take 50 mg by mouth every 6 (six) hours as needed (for pain).      Inpatient Medications    Scheduled Meds: . aspirin  81 mg Oral Daily  . atorvastatin  20 mg Oral q1800  . docusate sodium  100 mg Oral BID  . enalapril  20 mg Oral Daily  . enoxaparin (LOVENOX) injection  0.5 mg/kg Subcutaneous Q24H  . escitalopram  10 mg Oral Daily  . oxybutynin  10 mg Oral Daily  . pantoprazole  40 mg Oral Daily  . pregabalin  75 mg Oral TID  . senna  1 tablet Oral BID  . vitamin B-12  1,000 mcg Oral Daily    Continuous Infusions:   PRN Meds: acetaminophen, diclofenac sodium, ondansetron (ZOFRAN) IV, polyethylene glycol, traMADol   ALLERGIES   Allergies  Allergen Reactions  . Codeine Itching    Only happened one time and did not occur last time patient was in the hospital. (??)    ROS   Pertinent items noted in HPI and remainder of comprehensive ROS otherwise negative.  Vitals   Vitals:   08/09/18 0345 08/09/18 0530 08/09/18 0629 08/09/18 0637  BP: 119/67   119/67  Pulse:    91  Resp: 18 12  12   Temp:    98.5 F (36.9 C)  TempSrc:    Oral  SpO2:    97%  Weight:   (!)  153.9 kg   Height:   5\' 4"  (1.626 m)     Intake/Output Summary (Last 24 hours) at 08/09/2018 1344 Last data filed at 08/09/2018 1200 Gross per 24 hour  Intake 240 ml  Output 950 ml  Net -710 ml   Filed Weights   08/09/18 0016 08/09/18 0629  Weight: Marland Kitchen)  149.7 kg (!) 153.9 kg    Physical Exam   General appearance: alert, no distress and morbidly obese Neck: no carotid bruit, no JVD and thyroid not enlarged, symmetric, no tenderness/mass/nodules Lungs: diminished breath sounds bibasilar Heart: regular rate and rhythm Abdomen: morbidly obese Extremities: extremities normal, atraumatic, no cyanosis or edema and flexion contracture is noted Pulses: 2+ and symmetric Skin: Skin color, texture, turgor normal. No rashes or lesions Neurologic: Mental status: Alert, oriented, thought content appropriate, 0-1/5 strength of both lower extremities, flexion contracture/spasm Psych: Pleasant  Labs   Results for orders placed or performed during the hospital encounter of 08/09/18 (from the past 48 hour(s))  CBC with Differential/Platelet     Status: None   Collection Time: 08/09/18 12:28 AM  Result Value Ref Range   WBC 8.4 4.0 - 10.5 K/uL   RBC 4.55 3.87 - 5.11 MIL/uL   Hemoglobin 13.6 12.0 - 15.0 g/dL   HCT 43.2 36.0 - 46.0 %   MCV 94.9 80.0 - 100.0 fL   MCH 29.9 26.0 - 34.0 pg   MCHC 31.5 30.0 - 36.0 g/dL   RDW 11.8 11.5 - 15.5 %   Platelets 309 150 - 400 K/uL   nRBC 0.0 0.0 - 0.2 %   Neutrophils Relative % 51 %   Neutro Abs 4.2 1.7 - 7.7 K/uL   Lymphocytes Relative 37 %   Lymphs Abs 3.1 0.7 - 4.0 K/uL   Monocytes Relative 9 %   Monocytes Absolute 0.8 0.1 - 1.0 K/uL   Eosinophils Relative 3 %   Eosinophils Absolute 0.2 0.0 - 0.5 K/uL   Basophils Relative 0 %   Basophils Absolute 0.0 0.0 - 0.1 K/uL   Immature Granulocytes 0 %   Abs Immature Granulocytes 0.02 0.00 - 0.07 K/uL    Comment: Performed at [facility], [address], [place], [place] [address]  Comprehensive  metabolic panel     Status: Abnormal   Collection Time: 08/09/18 12:28 AM  Result Value Ref Range   Sodium 137 135 - 145 mmol/L   Potassium 3.9 3.5 - 5.1 mmol/L   Chloride 103 98 - 111 mmol/L   CO2 22 22 - 32 mmol/L   Glucose, Bld 112 (H) 70 - 99 mg/dL   BUN 11 6 - 20 mg/dL   Creatinine, Ser 0.56 0.44 - 1.00 mg/dL   Calcium 11.8 (H) 8.9 - 10.3 mg/dL   Total Protein 7.2 6.5 - 8.1 g/dL   Albumin 3.4 (L) 3.5 - 5.0 g/dL   AST 16 15 - 41 U/L   ALT 11 0 - 44 U/L   Alkaline Phosphatase 96 38 - 126 U/L   Total Bilirubin 0.5 0.3 - 1.2 mg/dL   GFR calc non Af Amer >60 >60 mL/min   GFR calc Af Amer >60 >60 mL/min   Anion gap 12 5 - 15    Comment: Performed at [facility] [address] [place], [place] [address]  Troponin I - ONCE - STAT     Status: Abnormal   Collection Time: 08/09/18 12:28 AM  Result Value Ref Range   Troponin I 0.08 (HH) <0.03 ng/mL    Comment: CRITICAL RESULT CALLED TO, READ BACK BY AND VERIFIED WITH: BAILIFF B,RN 08/09/18 0118 WAYK Performed at [facility], [place] [address], [place], [place] [address]   D-dimer, quantitative (not at [facility])     Status: Abnormal   Collection Time: 08/09/18 12:28 AM  Result Value Ref Range  D-Dimer, Quant 1.86 (H) 0.00 - 0.50 ug/mL-FEU    Comment: (NOTE) At the manufacturer cut-off of 0.50 ug/mL FEU, this assay has been documented to exclude PE with a sensitivity and negative predictive value of 97 to 99%.  At this time, this assay has not been approved by the FDA to exclude DVT/VTE. Results should be correlated with clinical presentation. Performed at [facility], [place] [address], [place], [place] [address]   CBC     Status: None   Collection Time: 08/09/18  7:31 AM  Result Value Ref Range   WBC 7.0 4.0 - 10.5 K/uL   RBC 4.25 3.87 - 5.11 MIL/uL   Hemoglobin 12.9 12.0 - 15.0 g/dL   HCT 40.1 36.0 - 46.0 %   MCV 94.4 80.0 - 100.0 fL   MCH 30.4 26.0 - 34.0 pg   MCHC 32.2 30.0 - 36.0 g/dL   RDW 11.9 11.5 -  15.5 %   Platelets 288 150 - 400 K/uL   nRBC 0.0 0.0 - 0.2 %    Comment: Performed at [facility], [place] [address] [place], [place] [address]  Creatinine, serum     Status: None   Collection Time: 08/09/18  7:31 AM  Result Value Ref Range   Creatinine, Ser 0.47 0.44 - 1.00 mg/dL   GFR calc non Af Amer >60 >60 mL/min   GFR calc Af Amer >60 >60 mL/min    Comment: Performed at [place] [address], [place], [place] [address]  Troponin I - Now Then Q3H     Status: Abnormal   Collection Time: 08/09/18  7:31 AM  Result Value Ref Range   Troponin I 0.08 (HH) <0.03 ng/mL    Comment: CRITICAL VALUE NOTED.  VALUE IS CONSISTENT WITH PREVIOUSLY REPORTED AND CALLED VALUE. Performed at [facility], [place] [address] [place], [place] [address]   Troponin I - Now Then Q3H     Status: Abnormal   Collection Time: 08/09/18 10:15 AM  Result Value Ref Range   Troponin I 0.07 (HH) <0.03 ng/mL    Comment: CRITICAL VALUE NOTED.  VALUE IS CONSISTENT WITH PREVIOUSLY REPORTED AND CALLED VALUE. Performed at [facility], [place] [address] [place], [place] [address]   Urinalysis, Routine w reflex microscopic     Status: Abnormal   Collection Time: 08/09/18 11:12 AM  Result Value Ref Range   Color, Urine YELLOW YELLOW   APPearance CLOUDY (A) CLEAR   Specific Gravity, Urine 1.041 (H) 1.005 - 1.030   pH 6.0 5.0 - 8.0   Glucose, UA NEGATIVE NEGATIVE mg/dL   Hgb urine dipstick SMALL (A) NEGATIVE   Bilirubin Urine NEGATIVE NEGATIVE   Ketones, ur NEGATIVE NEGATIVE mg/dL   Protein, ur 30 (A) NEGATIVE mg/dL   Nitrite NEGATIVE NEGATIVE   Leukocytes,Ua LARGE (A) NEGATIVE    Comment: Performed at [place] [address] [place], [place] [address]  Urinalysis, Microscopic (reflex)     Status: Abnormal   Collection Time: 08/09/18 11:12 AM  Result Value Ref Range   RBC / HPF 6-10 0 - 5 RBC/hpf   WBC, UA >50 0 - 5 WBC/hpf   Bacteria, UA MANY (A) NONE SEEN   Squamous  Epithelial / LPF 0-5 0 - 5   Ca Oxalate Crys, UA PRESENT     Comment: Performed at [facility] [address], [place], [place] [address]    ECG  Sinus rhythm without ischemic changes - Personally Reviewed  Telemetry   Sinus rhythm - Personally Reviewed  Radiology   Ct Angio Chest Pe W And/or Wo Contrast  Result Date: 08/09/2018 CLINICAL DATA:  58 y/o F; nonradiating chest pain. PE suspected, intermediate prob, positive D-dimer. EXAM: CT ANGIOGRAPHY CHEST WITH CONTRAST TECHNIQUE: Multidetector CT imaging of the chest was performed using the standard protocol during bolus administration of intravenous contrast. Multiplanar CT image reconstructions and MIPs were obtained to evaluate the vascular anatomy. CONTRAST:  133mL OMNIPAQUE IOHEXOL 300 MG/ML  SOLN COMPARISON:  05/30/2018 CTA of the chest. FINDINGS: Cardiovascular: Mild respiratory motion artifact. Satisfactory opacification of the pulmonary arteries to the segmental level. No pulmonary embolus identified. Normal heart size. No pericardial effusion. Mediastinum/Nodes: No enlarged mediastinal, hilar, or axillary lymph nodes. Thyroid gland, trachea, and esophagus demonstrate no significant findings. Lungs/Pleura: Mosaic attenuation of the lungs. No consolidation, effusion, or pneumothorax. Upper Abdomen: No acute abnormality. Musculoskeletal: Multilevel discogenic degenerative changes of the thoracic spine. No acute osseous abnormality is evident. Review of the MIP images confirms the above findings. IMPRESSION: 1. Mild respiratory motion artifact. No pulmonary embolus identified. 2. Mosaic attenuation of the lungs probably represents small airways disease and air trapping. Electronically Signed   By: Kristine Garbe M.D.   On: 08/09/2018 04:40   Dg Chest Port 1 View  Result Date: 08/09/2018 CLINICAL DATA:  Chest pain. EXAM: PORTABLE CHEST 1 VIEW COMPARISON:  06/24/2018 FINDINGS: The cardiomediastinal contours are unchanged.  The lungs are clear. Pulmonary vasculature is normal. No consolidation, pleural effusion, or pneumothorax. No acute osseous abnormalities are seen. IMPRESSION: No acute chest findings. Electronically Signed   By: Keith Rake M.D.   On: 08/09/2018 00:57    Cardiac Studies   Echo pending  Impression   1. Principal Problem: 2.   Chest pain 3. Active Problems: 4.   Hyperlipidemia 5.   Obesity, morbid, BMI 50 or higher ([place]) 6.   Essential hypertension 7.   GERD 8.   OSA (obstructive sleep apnea) 9.   Paraplegia ([place]) 10.   Recommendation   1. Ms. Fredman is describing atypical chest pain which is likely musculoskeletal. With reports of possible prior spinal cord injury, she is now describing lower extremity spasms. She had urinary retention today, which may indicate a neurogenic bladder. Chest pain can be associated with this. Furthermore, there is a possible UTI with many leukocytes and bacteria in the urine, which also can precipitate pain symptoms in paraplegia and may be the cause of elevated troponin. Will await echo study. If LVEF is normal with no regional wall motion abnormalities, then would consider outpatient 2-day myoview stress test. D/w Dr. Algis Liming who will evaluate need for possible baclofen +/- ABx for her abnormal UA.  Thanks for the consultation. Cardiology will follow with you.  Time Spent Directly with Patient:  I have spent a total of 45 minutes with the patient reviewing hospital notes, telemetry, EKGs, labs and examining the patient as well as establishing an assessment and plan that was discussed personally with the patient.  > 50% of time was spent in direct patient care.  Length of Stay:  LOS: 0 days   Pixie Casino, MD, [facility], [place] Director of the [facility] Lipid Disorders &  Cardiovascular Risk Reduction Clinic Diplomate of the American Board of Clinical Lipidology Attending Cardiologist  Direct Dial:  [phone]  Fax: [phone]  Website:  [email or web address]   Pixie Casino 08/09/2018,  1:44 PM

## 2018-08-09 NOTE — ED Provider Notes (Signed)
[facility] EMERGENCY DEPARTMENT Provider Note   CSN: [id number] Arrival date & time: 08/08/18  2359     History   Chief Complaint Chief Complaint  Patient presents with   Chest Pain   Back Pain    HPI Ellen [name] is a 58 y.o. female.     The history is provided by the patient.  Chest Pain Pain location:  Substernal area Pain quality: aching   Pain radiates to:  Does not radiate Pain severity:  Moderate Onset quality:  Gradual Duration:  1 day Timing:  Constant Progression:  Unchanged Chronicity:  New Context: movement   Relieved by:  Certain positions and leaning forward Worsened by:  Nothing Associated symptoms: back pain   Associated symptoms: no abdominal pain, no cough, no diaphoresis, no fever, no shortness of breath and no vomiting   Back Pain Associated symptoms: chest pain   Associated symptoms: no abdominal pain and no fever    Patient with history of obesity, hypertension, paraplegia presents with chest pain.  She reports has had substernal chest pain for the past day.  It has been constant.  Worse with movement and  palpation.  No fever or cough.  No shortness of breath.  No hemoptysis She received ASA en route  She reports chronic weakness and numbness in her legs. Reports intermittent episodes of back spasms that she has had for months Patient reports she receives round-the-clock care at home. Past Medical History:  Diagnosis Date   Bronchitis    hx of   Degenerative joint disease    Depression    ENDOMETRIAL POLYP 01/08/2006   Dr. Agnes Lawrence.  2001:  D&C, benign;  07/14/10:  D&C, hysteroscopy, polypectomy and bx of mons pubis (peau d'orange)...... 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   GERD (gastroesophageal reflux disease)    Hernia    High cholesterol    Hypertension    Lipoma    Obesity    Sleep apnea    uses cpap    Patient Active Problem List   Diagnosis Date  Noted   Back pain 06/17/2018   OSA (obstructive sleep apnea) 06/17/2018   Hypercalcemia 06/17/2018   Anxiety 04/06/2017   Acute respiratory failure with hypoxia (HCC)    Acute hypoxemic respiratory failure ([place]) 04/05/2017   SIRS (systemic inflammatory response syndrome) ([place]) 04/05/2017   Elevated troponin 04/05/2017   Hyponatremia 04/05/2017   Urgency of micturation 04/14/2015   Dizziness 04/14/2015   Neck pain 03/31/2015   Abnormal uterine bleeding 08/29/2012   Incarcerated ventral hernia 09/11/2010   COUGH 05/13/2009   ALLERGIC RHINITIS, SEASONAL 05/26/2008   SHOULDER PAIN, LEFT 08/11/2007   PRURITUS, VAGINAL 09/30/2006   BACK PAIN 08/05/2006   Arthritis of both knees 02/15/2006   LIPOMA 01/08/2006   Hyperlipidemia 01/08/2006   Obesity, morbid, BMI 50 or higher ([place]) 01/08/2006   DEPRESSION 01/08/2006   Essential hypertension 01/08/2006   BRONCHITIS NOS 01/08/2006   GERD 01/08/2006   HERNIA, VENTRAL 01/08/2006   ENDOMETRIAL POLYP 01/08/2006   DEGENERATIVE JOINT DISEASE 01/08/2006    Past Surgical History:  Procedure Laterality Date   CHOLECYSTECTOMY     DILATION AND CURETTAGE OF UTERUS     LUMBAR LAMINECTOMY/DECOMPRESSION MICRODISCECTOMY N/A 06/19/2018   Procedure: THORACIC TEN-THORACIC ELEVEN  LAMINECTOMY;  Surgeon: Consuella Lose, MD;  Location: [place];  Service: Neurosurgery;  Laterality: N/A;  posterior   resection of lipoma     UMBILICAL HERNIA REPAIR  OB History   No obstetric history on file.      Home Medications    Prior to Admission medications   Medication Sig Start Date End Date Taking? Authorizing Provider  albuterol (PROVENTIL HFA;VENTOLIN HFA) 108 (90 Base) MCG/ACT inhaler Inhale 2 puffs into the lungs every 4 (four) hours as needed for wheezing or shortness of breath. Patient not taking: Reported on 05/30/2018 11/29/15   Duffy Bruce, MD  aspirin (BL ADULT ASPIRIN LOW STRENGTH) 81 MG chewable tablet  Chew 81 mg by mouth daily.      [provider]  atorvastatin (LIPITOR) 20 MG tablet Take 20 mg by mouth daily at 6 PM.  03/28/17   [provider]  diclofenac sodium (VOLTAREN) 1 % GEL Apply 2-4 g topically every 2 (two) hours as needed (for bilateral knee pain).    [provider]  docusate sodium (COLACE) 100 MG capsule Take 1 capsule (100 mg total) by mouth 2 (two) times daily. 06/27/18   Hosie Poisson, MD  enalapril (VASOTEC) 20 MG tablet Take 20 mg by mouth daily.     [provider]  escitalopram (LEXAPRO) 10 MG tablet Take 10 mg by mouth daily.    [provider]  ipratropium (ATROVENT) 0.02 % nebulizer solution Take 2.5 mLs (0.5 mg total) by nebulization every 6 (six) hours as needed for wheezing or shortness of breath. Patient not taking: Reported on 06/18/2018 04/08/17   Raiford Noble Latif, DO  levalbuterol Penne Lash) 0.63 MG/3ML nebulizer solution Take 3 mLs (0.63 mg total) by nebulization every 6 (six) hours as needed for wheezing or shortness of breath. Patient not taking: Reported on 06/18/2018 04/08/17   Raiford Noble Latif, DO  methocarbamol (ROBAXIN) 500 MG tablet Take 1 tablet (500 mg total) by mouth every 8 (eight) hours as needed for muscle spasms. Patient not taking: Reported on 05/30/2018 02/20/18   Jola Schmidt, MD  omeprazole (PRILOSEC) 20 MG capsule Take 20 mg by mouth daily before breakfast.    [provider]  oxybutynin (DITROPAN-XL) 10 MG 24 hr tablet Take 10 mg by mouth daily. 03/24/17   [provider]  polyethylene glycol powder (GLYCOLAX/MIRALAX) 17 GM/SCOOP powder Take 17 g by mouth daily as needed for mild constipation.     [provider]  pregabalin (LYRICA) 75 MG capsule Take 75 mg by mouth 3 (three) times daily.    [provider]  senna (SENOKOT) 8.6 MG TABS tablet Take 1 tablet (8.6 mg total) by mouth 2 (two) times daily. 06/27/18   Hosie Poisson, MD  traMADol (ULTRAM) 50 MG tablet Take 50 mg  by mouth every 6 (six) hours as needed (for pain).    [provider]  vitamin B-12 (CYANOCOBALAMIN) 1000 MCG tablet Take 1,000 mcg by mouth daily.    [provider]  Vitamin D, Ergocalciferol, (DRISDOL) 50000 units CAPS capsule Take 50,000 Units by mouth every Monday.  03/24/17   [provider]    Family History Family History  Problem Relation Age of Onset   Heart disease Mother    Diabetes Mother    Cancer Father     Social History Social History   Tobacco Use   Smoking status: Never Smoker   Smokeless tobacco: Never Used  Substance Use Topics   Alcohol use: No    Alcohol/week: 0.0 standard drinks   Drug use: No     Allergies   Codeine   Review of Systems Review of Systems  Constitutional: Negative for diaphoresis  and fever.  Respiratory: Negative for cough and shortness of breath.   Cardiovascular: Positive for chest pain.  Gastrointestinal: Negative for abdominal pain and vomiting.  Musculoskeletal: Positive for back pain.  Neurological:       Chronic weakness and numbness in the legs  All other systems reviewed and are negative.    Physical Exam Updated Vital Signs BP 125/77 (BP Location: Right Arm)    Pulse 89    Temp 98.2 F (36.8 C) (Oral)    Resp 16    Ht 1.626 m (5\' 4" )    Wt (!) 149.7 kg    LMP 07/11/2014    SpO2 99%    BMI 56.64 kg/m   Physical Exam CONSTITUTIONAL: Well developed/well nourished HEAD: Normocephalic/atraumatic EYES: EOMI ENMT: Mucous membranes moist NECK: supple no meningeal signs SPINE/BACK:entire spine nontender CV: S1/S2 noted, no murmurs/rubs/gallops noted Chest-chest wall tenderness, no crepitus LUNGS: Lungs are clear to auscultation bilaterally, no apparent distress ABDOMEN: soft, nontender, obese  NEURO: Pt is awake/alert/appropriate, she has equal strength noted in bilateral upper extremities. She has complete paralysis of bilateral lower extremities.  She has no sensation below  umbilicus EXTREMITIES: pulses normal/equal, full ROM SKIN: warm, color normal, no sacral wounds noted PSYCH: no abnormalities of mood noted, alert and oriented to situation   ED Treatments / Results  Labs (all labs ordered are listed, but only abnormal results are displayed) Labs Reviewed  COMPREHENSIVE METABOLIC PANEL - Abnormal; Notable for the following components:      Result Value   Glucose, Bld 112 (*)    Calcium 11.8 (*)    Albumin 3.4 (*)    All other components within normal limits  TROPONIN I - Abnormal; Notable for the following components:   Troponin I 0.08 (*)    All other components within normal limits  D-DIMER, QUANTITATIVE (NOT AT [facility]) - Abnormal; Notable for the following components:   D-Dimer, Quant 1.86 (*)    All other components within normal limits  CBC WITH DIFFERENTIAL/PLATELET  URINALYSIS, ROUTINE W REFLEX MICROSCOPIC    EKG EKG Interpretation  Date/Time:  Saturday August 09 2018 00:10:25 EDT Ventricular Rate:  89 PR Interval:    QRS Duration: 91 QT Interval:  323 QTC Calculation: 393 R Axis:   31 Text Interpretation:  Sinus rhythm Abnormal R-wave progression, early transition Borderline T abnormalities, inferior leads Interpretation limited secondary to artifact Otherwise no significant change Confirmed by Ripley Fraise [phone]) on 08/09/2018 12:14:48 AM   Radiology Dg Chest Port 1 View  Result Date: 08/09/2018 CLINICAL DATA:  Chest pain. EXAM: PORTABLE CHEST 1 VIEW COMPARISON:  06/24/2018 FINDINGS: The cardiomediastinal contours are unchanged. The lungs are clear. Pulmonary vasculature is normal. No consolidation, pleural effusion, or pneumothorax. No acute osseous abnormalities are seen. IMPRESSION: No acute chest findings. Electronically Signed   By: Keith Rake M.D.   On: 08/09/2018 00:57    Procedures Procedures  Medications Ordered in ED Medications  fentaNYL (SUBLIMAZE) injection 100 mcg (100 mcg Intravenous Given 08/09/18 0048)    fentaNYL (SUBLIMAZE) injection 50 mcg (50 mcg Intravenous Given 08/09/18 0332)  iohexol (OMNIPAQUE) 300 MG/ML solution 100 mL (100 mLs Intravenous Contrast Given 08/09/18 0407)     Initial Impression / Assessment and Plan / ED Course  I have reviewed the triage vital signs and the nursing notes.  Pertinent labs & imaging results that were available during my care of the patient were reviewed by me and considered in my medical decision making (see chart for details).  12:59 AM Patient's main concern tonight is chest pain. She has been paraplegic for several months, had decompressive thoracic laminectomy in April, and it appears her neuro exam is similar to prior and unchanged (previous sensory deficit started around umbilicus which is c/w today's exam Denies h/o CAD/VTE Will focus on her chest pain. 3:38 AM Pt is now reporting pleuritic chest pain.  She is high risk for VTE due to paraplegia.  Will order d-dimer 5:20 AM CT scan was ordered due to elevated d-dimer.  There is no acute PE.  Troponin elevated, but similar to prior.  She is reporting continued chest pain. She reports this chest pain is unusual for.  I have admitted the patient to the hospitalist service.  Discussed with Dr. Jonelle Sidle for admission Final Clinical Impressions(s) / ED Diagnoses   Final diagnoses:  Chest pain, rule out acute myocardial infarction    ED Discharge Orders    None       Ripley Fraise, MD 08/09/18 Delrae Rend

## 2018-08-09 NOTE — Progress Notes (Signed)
In and out cath pt tolerated well 950 cc of urine out with cath

## 2018-08-09 NOTE — Progress Notes (Addendum)
PROGRESS NOTE   Ellen [name]  KNL:[id number]    DOB: [birth year]    DOA: 08/09/2018  PCP: Deborah Chalk, FNP   I have briefly reviewed patients previous medical records in [facility].  Brief Narrative:  58 year old female, lives with her fianc and his brother, nonambulatory reportedly since March 2020, PMH of HTN, HLD, GERD, OSA noncompliant with CPAP, morbid obesity, paraplegia and depression presented to ED on 08/09/2018 with atypical chest pain, without acute EKG changes, minimally elevated troponin with flat trend.  Cardiology consulted.  Hospital course complicated by acute urinary retention and suspected acute cystitis.   Assessment & Plan:   Principal Problem:   Chest pain Active Problems:   Hyperlipidemia   Obesity, morbid, BMI 50 or higher (HCC)   Essential hypertension   GERD   OSA (obstructive sleep apnea)   Paraplegia (HCC)   Atypical chest pain  Patient has multiple CAD risk factors including HTN, HLD, morbid obesity, family history of reported MI and death in mother in her 55s.  Presented with atypical chest pain, suspicious for musculoskeletal etiology.  EKG shows sinus rhythm with?  Partial LBBB morphology but no acute changes.  CTA chest negative for pulmonary embolism.  Troponin minimally elevated with flat trend, 0.08 > 0.08.  Chest pain reproducible to palpation.  Continue aspirin and statins.  Cardiology consulted and await input.  Low index of suspicion for ACS, hence changed full dose Lovenox to DVT prophylactic dose.  Essential hypertension  Controlled on enalapril 20 mg daily.  Hyperlipidemia  Continue atorvastatin 20 mg daily.  Morbid obesity/Body mass index is 58.24 kg/m.  OSA noncompliant with CPAP  Hypercalcemia  Mild and asymptomatic.  Unclear etiology.  Not on calcium or vitamin D supplements.  Intact PTH, PTH RP 06/18/2018 were WNL.  SPEP/IFE 06/18/2018: Immunofixation pattern unremarkable.  No monoclonal protein  noted.  DC vitamin D supplements until outpatient follow-up with PCP with repeat CMP and consider vitamin D levels if not recently done.  Encourage oral fluid intake.  GERD  Continue PPI.  Chronic paraplegia, s/p T10-T11 decompressive laminectomy 06/19/2018  Bedbound at home, gets PT and OT reportedly  States that she has been working on upper extremity strength which may have contributed to her musculoskeletal chest wall pain.  Continue pregabalin for neuropathy  Added baclofen for discomforting lower extremity spasms.  Acute urinary retention  May be related to acute cystitis complicating paraplegia  In and out catheterization as needed for now but if has frequent episodes then may have to leave Foley catheter in.  Hold Ditropan XL which can also contribute to urinary retention.  Treat UTI as below.  Acute cystitis  Patient has acute lower UTI symptoms.  Urine microscopy suggestive of UTI.  Send urine culture and start IV ceftriaxone 1 g every 24 hours.   DVT prophylaxis: Lovenox Code Status: Full Family Communication: None at bedside Disposition: DC home pending clinical improvement   Consultants:  Cardiology  Procedures:  None  Antimicrobials:  None   Subjective: No prior history of chest pain until 6/19.  Chest pain reportedly started in the morning when she was working with PT, feels that it may be related to her lower extremity spasms, rated as 8/10, nonradiating, not associated with dyspnea, nausea vomiting or diaphoresis.  Reportedly lasted about 5 minutes and resolved spontaneously without recurrence.  States that she mostly stays in bed except on days when therapies come to work with her at home.  Incontinent of urine and stools  and hence wears diapers.  Currently without chest pain or dyspnea.  Intermittent lower extremity spasms.  ROS: As above, otherwise negative.  Objective:  Vitals:   08/09/18 0345 08/09/18 0530 08/09/18 0629 08/09/18 0637   BP: 119/67   119/67  Pulse:    91  Resp: 18 12  12   Temp:    98.5 F (36.9 C)  TempSrc:    Oral  SpO2:    97%  Weight:   (!) 153.9 kg   Height:   5\' 4"  (1.626 m)     Examination:  General exam: Pleasant young female, moderately built and morbidly obese lying comfortably propped up in bed without distress. Respiratory system: Clear to auscultation. Respiratory effort normal.  Reproducible midsternal chest wall tenderness. Cardiovascular system: S1 & S2 heard, RRR. No JVD, murmurs, rubs, gallops or clicks. No pedal edema.  Telemetry personally reviewed: Sinus rhythm. Gastrointestinal system: Abdomen is nondistended, soft and nontender. No organomegaly or masses felt. Normal bowel sounds heard. Central nervous system: Alert and oriented. No focal neurological deficits. Extremities: Symmetric 5 x 5 power in upper extremities and grade 0 x 5 power in lower extremities.  Noted intermittent discomforting lower extremity spasms. Skin: No rashes, lesions or ulcers Psychiatry: Judgement and insight appear normal. Mood & affect appropriate.     Data Reviewed: I have personally reviewed following labs and imaging studies  CBC: Recent Labs  Lab 08/09/18 0028 08/09/18 0731  WBC 8.4 7.0  NEUTROABS 4.2  --   HGB 13.6 12.9  HCT 43.2 40.1  MCV 94.9 94.4  PLT 309 329   Basic Metabolic Panel: Recent Labs  Lab 08/09/18 0028 08/09/18 0731  NA 137  --   K 3.9  --   CL 103  --   CO2 22  --   GLUCOSE 112*  --   BUN 11  --   CREATININE 0.56 0.47  CALCIUM 11.8*  --    Liver Function Tests: Recent Labs  Lab 08/09/18 0028  AST 16  ALT 11  ALKPHOS 96  BILITOT 0.5  PROT 7.2  ALBUMIN 3.4*   Coagulation Profile: No results for input(s): INR, PROTIME in the last 168 hours. Cardiac Enzymes: Recent Labs  Lab 08/09/18 0028 08/09/18 0731 08/09/18 1015 08/09/18 1301  TROPONINI 0.08* 0.08* 0.07* 0.07*   HbA1C: No results for input(s): HGBA1C in the last 72 hours. CBG: No results  for input(s): GLUCAP in the last 168 hours.  No results found for this or any previous visit (from the past 240 hour(s)).       Radiology Studies: Ct Angio Chest Pe W And/or Wo Contrast  Result Date: 08/09/2018 CLINICAL DATA:  58 y/o F; nonradiating chest pain. PE suspected, intermediate prob, positive D-dimer. EXAM: CT ANGIOGRAPHY CHEST WITH CONTRAST TECHNIQUE: Multidetector CT imaging of the chest was performed using the standard protocol during bolus administration of intravenous contrast. Multiplanar CT image reconstructions and MIPs were obtained to evaluate the vascular anatomy. CONTRAST:  138mL OMNIPAQUE IOHEXOL 300 MG/ML  SOLN COMPARISON:  05/30/2018 CTA of the chest. FINDINGS: Cardiovascular: Mild respiratory motion artifact. Satisfactory opacification of the pulmonary arteries to the segmental level. No pulmonary embolus identified. Normal heart size. No pericardial effusion. Mediastinum/Nodes: No enlarged mediastinal, hilar, or axillary lymph nodes. Thyroid gland, trachea, and esophagus demonstrate no significant findings. Lungs/Pleura: Mosaic attenuation of the lungs. No consolidation, effusion, or pneumothorax. Upper Abdomen: No acute abnormality. Musculoskeletal: Multilevel discogenic degenerative changes of the thoracic spine. No acute osseous abnormality is evident. Review  of the MIP images confirms the above findings. IMPRESSION: 1. Mild respiratory motion artifact. No pulmonary embolus identified. 2. Mosaic attenuation of the lungs probably represents small airways disease and air trapping. Electronically Signed   By: Kristine Garbe M.D.   On: 08/09/2018 04:40   Dg Chest Port 1 View  Result Date: 08/09/2018 CLINICAL DATA:  Chest pain. EXAM: PORTABLE CHEST 1 VIEW COMPARISON:  06/24/2018 FINDINGS: The cardiomediastinal contours are unchanged. The lungs are clear. Pulmonary vasculature is normal. No consolidation, pleural effusion, or pneumothorax. No acute osseous  abnormalities are seen. IMPRESSION: No acute chest findings. Electronically Signed   By: Keith Rake M.D.   On: 08/09/2018 00:57        Scheduled Meds: . aspirin  81 mg Oral Daily  . atorvastatin  20 mg Oral q1800  . docusate sodium  100 mg Oral BID  . enalapril  20 mg Oral Daily  . enoxaparin (LOVENOX) injection  0.5 mg/kg Subcutaneous Q24H  . escitalopram  10 mg Oral Daily  . pantoprazole  40 mg Oral Daily  . pregabalin  75 mg Oral TID  . senna  1 tablet Oral BID  . vitamin B-12  1,000 mcg Oral Daily   Continuous Infusions: . cefTRIAXone (ROCEPHIN)  IV       LOS: 0 days     Vernell Leep, MD, FACP, [facility]. [facility]  To contact the attending provider between 7A-7P or the covering provider during after hours 7P-7A, please log into the web site [email or web address] and access using universal [place] password for that web site. If you do not have the password, please call the hospital operator.  08/09/2018, 3:19 PM

## 2018-08-09 NOTE — ED Triage Notes (Signed)
BIB [facility] from home with c/o of non-radiating chest pain starting yesterday. Pt states CP follows episodes of back spasms. Also reports bilateral leg pain since back surgery 3 mo ago. Received 325 asp PTA.

## 2018-08-09 NOTE — H&P (Signed)
History and Physical   Ellen [name] FYB:[id number] DOB: 03-17-1960 DOA: 08/09/2018  Referring MD/NP/PA: Dr. Christy Gentles  PCP: Deborah Chalk, FNP   Outpatient Specialists: None  Patient coming from: Home  Chief Complaint: Chest pain  HPI: Ellen [name] is a 58 y.o. female with medical history significant of hypertension, hyperlipidemia, morbid obesity, depression, GERD, obstructive sleep apnea and paraplegia who came in with his significant chest pain for the last few days.  Chest pain is central.  No radiation.Suspected pulmonary embolism but has been ruled out.  Has also no diaphoresis.  She has risk factors including hypertension diabetes.  Patient is being admitted for observation and rule out MI..  ED Course: Temperature 98.2 blood pressure 133/83 pulse 91 respiratory of 30 oxygen sat 96% room air.  CBC and chemistry appears to be within normal except calcium 11.8.  Troponin is 0.08.  D-dimer 1.86,Chest x-ray showed no acute findings.  CT angiogram shows no acute findings.  Patient will be admitted for observation and rule out MI  Review of Systems: As per HPI otherwise 10 point review of systems negative.    Past Medical History:  Diagnosis Date   Bronchitis    hx of   Degenerative joint disease    Depression    ENDOMETRIAL POLYP 01/08/2006   Dr. Agnes Lawrence.  2001:  D&C, benign;  07/14/10:  D&C, hysteroscopy, polypectomy and bx of mons pubis (peau d'orange)...... 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   GERD (gastroesophageal reflux disease)    Hernia    High cholesterol    Hypertension    Lipoma    Obesity    Sleep apnea    uses cpap    Past Surgical History:  Procedure Laterality Date   CHOLECYSTECTOMY     DILATION AND CURETTAGE OF UTERUS     LUMBAR LAMINECTOMY/DECOMPRESSION MICRODISCECTOMY N/A 06/19/2018   Procedure: THORACIC TEN-THORACIC ELEVEN  LAMINECTOMY;  Surgeon: Consuella Lose, MD;  Location: [facility];   Service: Neurosurgery;  Laterality: N/A;  posterior   resection of lipoma     UMBILICAL HERNIA REPAIR       reports that she has never smoked. She has never used smokeless tobacco. She reports that she does not drink alcohol or use drugs.  Allergies  Allergen Reactions   Codeine Itching    Only happened one time and did not occur last time patient was in the hospital. (??)    Family History  Problem Relation Age of Onset   Heart disease Mother    Diabetes Mother    Cancer Father      Prior to Admission medications   Medication Sig Start Date End Date Taking? Authorizing Provider  albuterol (PROVENTIL HFA;VENTOLIN HFA) 108 (90 Base) MCG/ACT inhaler Inhale 2 puffs into the lungs every 4 (four) hours as needed for wheezing or shortness of breath. Patient not taking: Reported on 05/30/2018 11/29/15   Duffy Bruce, MD  aspirin (BL ADULT ASPIRIN LOW STRENGTH) 81 MG chewable tablet Chew 81 mg by mouth daily.      [provider]  atorvastatin (LIPITOR) 20 MG tablet Take 20 mg by mouth daily at 6 PM.  03/28/17   [provider]  diclofenac sodium (VOLTAREN) 1 % GEL Apply 2-4 g topically every 2 (two) hours as needed (for bilateral knee pain).    [provider]  docusate sodium (COLACE) 100 MG capsule Take 1 capsule (100 mg total) by mouth 2 (two) times daily. 06/27/18   Karleen Hampshire,  Vijaya, MD  enalapril (VASOTEC) 20 MG tablet Take 20 mg by mouth daily.     [provider]  escitalopram (LEXAPRO) 10 MG tablet Take 10 mg by mouth daily.    [provider]  ipratropium (ATROVENT) 0.02 % nebulizer solution Take 2.5 mLs (0.5 mg total) by nebulization every 6 (six) hours as needed for wheezing or shortness of breath. Patient not taking: Reported on 06/18/2018 04/08/17   Raiford Noble Latif, DO  levalbuterol Penne Lash) 0.63 MG/3ML nebulizer solution Take 3 mLs (0.63 mg total) by nebulization every 6 (six) hours as needed for wheezing or shortness of  breath. Patient not taking: Reported on 06/18/2018 04/08/17   Raiford Noble Latif, DO  methocarbamol (ROBAXIN) 500 MG tablet Take 1 tablet (500 mg total) by mouth every 8 (eight) hours as needed for muscle spasms. Patient not taking: Reported on 05/30/2018 02/20/18   Jola Schmidt, MD  omeprazole (PRILOSEC) 20 MG capsule Take 20 mg by mouth daily before breakfast.    [provider]  oxybutynin (DITROPAN-XL) 10 MG 24 hr tablet Take 10 mg by mouth daily. 03/24/17   [provider]  polyethylene glycol powder (GLYCOLAX/MIRALAX) 17 GM/SCOOP powder Take 17 g by mouth daily as needed for mild constipation.     [provider]  pregabalin (LYRICA) 75 MG capsule Take 75 mg by mouth 3 (three) times daily.    [provider]  senna (SENOKOT) 8.6 MG TABS tablet Take 1 tablet (8.6 mg total) by mouth 2 (two) times daily. 06/27/18   Hosie Poisson, MD  traMADol (ULTRAM) 50 MG tablet Take 50 mg by mouth every 6 (six) hours as needed (for pain).    [provider]  vitamin B-12 (CYANOCOBALAMIN) 1000 MCG tablet Take 1,000 mcg by mouth daily.    [provider]  Vitamin D, Ergocalciferol, (DRISDOL) 50000 units CAPS capsule Take 50,000 Units by mouth every Monday.  03/24/17   [provider]    Physical Exam: Vitals:   08/09/18 0016 08/09/18 0330 08/09/18 0332 08/09/18 0345  BP:  133/83 131/81 119/67  Pulse:   91   Resp:  17 (!) 30 18  Temp:      TempSrc:      SpO2:   100%   Weight: (!) 149.7 kg     Height: 5\' 4"  (1.626 m)         Constitutional: Morbidly obese, paraplegic Vitals:   08/09/18 0016 08/09/18 0330 08/09/18 0332 08/09/18 0345  BP:  133/83 131/81 119/67  Pulse:   91   Resp:  17 (!) 30 18  Temp:      TempSrc:      SpO2:   100%   Weight: (!) 149.7 kg     Height: 5\' 4"  (1.626 m)      Eyes: PERRL, lids and conjunctivae normal ENMT: Mucous membranes are moist. Posterior pharynx clear of any exudate or lesions.Normal dentition.  Neck:  normal, supple, no masses, no thyromegaly Respiratory: clear to auscultation bilaterally, no wheezing, no crackles. Normal respiratory effort. No accessory muscle use.  Cardiovascular: Regular rate and rhythm, no murmurs / rubs / gallops. No extremity edema. 2+ pedal pulses. No carotid bruits.  Abdomen: no tenderness, no masses palpated. No hepatosplenomegaly. Bowel sounds positive.  Musculoskeletal: no clubbing / cyanosis. No joint deformity upper and lower extremities. Good ROM, no contractures. Normal muscle tone.  Skin: no rashes, lesions, ulcers. No induration Neurologic: Paraplegia, fully awake and alert and communicating. Psychiatric: Normal judgment and insight. Alert  and oriented x 3. Normal mood.     Labs on Admission: I have personally reviewed following labs and imaging studies  CBC: Recent Labs  Lab 08/09/18 0028  WBC 8.4  NEUTROABS 4.2  HGB 13.6  HCT 43.2  MCV 94.9  PLT 867   Basic Metabolic Panel: Recent Labs  Lab 08/09/18 0028  NA 137  K 3.9  CL 103  CO2 22  GLUCOSE 112*  BUN 11  CREATININE 0.56  CALCIUM 11.8*   GFR: Estimated Creatinine Clearance: 110.8 mL/min (by C-G formula based on SCr of 0.56 mg/dL). Liver Function Tests: Recent Labs  Lab 08/09/18 0028  AST 16  ALT 11  ALKPHOS 96  BILITOT 0.5  PROT 7.2  ALBUMIN 3.4*   No results for input(s): LIPASE, AMYLASE in the last 168 hours. No results for input(s): AMMONIA in the last 168 hours. Coagulation Profile: No results for input(s): INR, PROTIME in the last 168 hours. Cardiac Enzymes: Recent Labs  Lab 08/09/18 0028  TROPONINI 0.08*   BNP (last 3 results) No results for input(s): PROBNP in the last 8760 hours. HbA1C: No results for input(s): HGBA1C in the last 72 hours. CBG: No results for input(s): GLUCAP in the last 168 hours. Lipid Profile: No results for input(s): CHOL, HDL, LDLCALC, TRIG, CHOLHDL, LDLDIRECT in the last 72 hours. Thyroid Function Tests: No results for  input(s): TSH, T4TOTAL, FREET4, T3FREE, THYROIDAB in the last 72 hours. Anemia Panel: No results for input(s): VITAMINB12, FOLATE, FERRITIN, TIBC, IRON, RETICCTPCT in the last 72 hours. Urine analysis:    Component Value Date/Time   COLORURINE YELLOW 06/26/2018 1614   APPEARANCEUR HAZY (A) 06/26/2018 1614   LABSPEC 1.009 06/26/2018 1614   PHURINE 6.0 06/26/2018 1614   GLUCOSEU NEGATIVE 06/26/2018 1614   GLUCOSEU NEG mg/dL 08/05/2006 2022   HGBUR MODERATE (A) 06/26/2018 1614   BILIRUBINUR NEGATIVE 06/26/2018 1614   KETONESUR NEGATIVE 06/26/2018 1614   PROTEINUR NEGATIVE 06/26/2018 1614   UROBILINOGEN 1.0 06/14/2011 1828   NITRITE NEGATIVE 06/26/2018 1614   LEUKOCYTESUR SMALL (A) 06/26/2018 1614   Sepsis Labs: @LABRCNTIP (procalcitonin:4,lacticidven:4) )No results found for this or any previous visit (from the past 240 hour(s)).   Radiological Exams on Admission: Ct Angio Chest Pe W And/or Wo Contrast  Result Date: 08/09/2018 CLINICAL DATA:  58 y/o F; nonradiating chest pain. PE suspected, intermediate prob, positive D-dimer. EXAM: CT ANGIOGRAPHY CHEST WITH CONTRAST TECHNIQUE: Multidetector CT imaging of the chest was performed using the standard protocol during bolus administration of intravenous contrast. Multiplanar CT image reconstructions and MIPs were obtained to evaluate the vascular anatomy. CONTRAST:  155mL OMNIPAQUE IOHEXOL 300 MG/ML  SOLN COMPARISON:  05/30/2018 CTA of the chest. FINDINGS: Cardiovascular: Mild respiratory motion artifact. Satisfactory opacification of the pulmonary arteries to the segmental level. No pulmonary embolus identified. Normal heart size. No pericardial effusion. Mediastinum/Nodes: No enlarged mediastinal, hilar, or axillary lymph nodes. Thyroid gland, trachea, and esophagus demonstrate no significant findings. Lungs/Pleura: Mosaic attenuation of the lungs. No consolidation, effusion, or pneumothorax. Upper Abdomen: No acute abnormality. Musculoskeletal:  Multilevel discogenic degenerative changes of the thoracic spine. No acute osseous abnormality is evident. Review of the MIP images confirms the above findings. IMPRESSION: 1. Mild respiratory motion artifact. No pulmonary embolus identified. 2. Mosaic attenuation of the lungs probably represents small airways disease and air trapping. Electronically Signed   By: Kristine Garbe M.D.   On: 08/09/2018 04:40   Dg Chest Port 1 View  Result Date: 08/09/2018 CLINICAL DATA:  Chest pain. EXAM:  PORTABLE CHEST 1 VIEW COMPARISON:  06/24/2018 FINDINGS: The cardiomediastinal contours are unchanged. The lungs are clear. Pulmonary vasculature is normal. No consolidation, pleural effusion, or pneumothorax. No acute osseous abnormalities are seen. IMPRESSION: No acute chest findings. Electronically Signed   By: Keith Rake M.D.   On: 08/09/2018 00:57    EKG: Independently reviewed.  It shows sinus rhythm with a rate of 89, flattened T waves in the lateral and septal leads, R wave progressions abnormalities.  No significant ST changes.  Assessment/Plan Principal Problem:   Chest pain Active Problems:   Hyperlipidemia   Obesity, morbid, BMI 50 or higher (HCC)   Essential hypertension   GERD   OSA (obstructive sleep apnea)   Paraplegia ([place])     #1 chest pain: Patient will be admitted for observation.  Serial enzymes will be checked.  Echocardiogram.  If positive patient will get cardiology consult.  #2 hyperlipidemia: Statin will be continued with.  #3 hypertension: Continue high blood pressure medications from home.  #4 obstructive sleep apnea: CPAP as needed  #5 morbid obesity: Dietary counseling  #6 paraplegia: Patient had laminectomy couple of months ago.  She is currently paraplegic.  PT OT.   DVT prophylaxis: Lovenox Code Status: Full code Family Communication: No family at bedside Disposition Plan: Home Consults called: None Admission status: Observation  Severity of  Illness: The appropriate patient status for this patient is OBSERVATION. Observation status is judged to be reasonable and necessary in order to provide the required intensity of service to ensure the patient's safety. The patient's presenting symptoms, physical exam findings, and initial radiographic and laboratory data in the context of their medical condition is felt to place them at decreased risk for further clinical deterioration. Furthermore, it is anticipated that the patient will be medically stable for discharge from the hospital within 2 midnights of admission. The following factors support the patient status of observation.   " The patient's presenting symptoms include chest pain and back pain. " The physical exam findings include paraplegic. " The initial radiographic and laboratory data are mildly increased troponin.     Barbette Merino MD [facility] Pager [phone]  If 7PM-7AM, please contact night-coverage [email or web address] Password [email or web address]  08/09/2018, 5:07 AM

## 2018-08-09 NOTE — Progress Notes (Signed)
ANTICOAGULATION CONSULT NOTE - Initial Consult  Pharmacy Consult for lovenox Indication: VTE prophylaxis  Allergies  Allergen Reactions  . Codeine Itching    Only happened one time and did not occur last time patient was in the hospital. (??)    Patient Measurements: Height: 5\' 4"  (162.6 cm) Weight: (!) 339 lb 4.6 oz (153.9 kg) IBW/kg (Calculated) : 54.7  Vital Signs: Temp: 98.5 F (36.9 C) (06/20 0637) Temp Source: Oral (06/20 0637) BP: 119/67 (06/20 1771) Pulse Rate: 91 (06/20 0637)  Labs: Recent Labs    08/09/18 0028 08/09/18 0731  HGB 13.6 12.9  HCT 43.2 40.1  PLT 309 288  CREATININE 0.56 0.47  TROPONINI 0.08* 0.08*    Estimated Creatinine Clearance: 112.8 mL/min (by C-G formula based on SCr of 0.47 mg/dL).   Medical History: Past Medical History:  Diagnosis Date  . Bronchitis    hx of  . Degenerative joint disease   . Depression   . ENDOMETRIAL POLYP 01/08/2006   Dr. Agnes Lawrence.  2001:  D&C, benign;  07/14/10:  D&C, hysteroscopy, polypectomy and bx of mons pubis (peau d'orange)...... 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  Marland Kitchen GERD (gastroesophageal reflux disease)   . Hernia   . High cholesterol   . Hypertension   . Lipoma   . Obesity   . Sleep apnea    uses cpap    Assessment: 59 YO F presenting with atypical CP. CTA chest negative for PE. Pharmacy consulted to dose lovenox for VTE ppx.   Pt weight >100kg, CBC stable, no reports of bleeding.  Goal of Therapy:  Monitor platelets by anticoagulation protocol: Yes   Plan:  Lovenox 0.5mg /kg SQ daily  Monitor platelets, s/sx bleeding  Juanell Fairly, PharmD PGY1 Pharmacy Resident 08/09/2018,10:22 AM

## 2018-08-09 NOTE — Care Management Obs Status (Signed)
[facility] NOTIFICATION   Patient Details  Name: Ellen [name] MRN: [id number] Date of Birth: 07-01-1960   Medicare Observation Status Notification Given:  Yes    Claudie Leach, RN 08/09/2018, 5:31 PM

## 2018-08-09 NOTE — ED Notes (Signed)
Patient transported to CT 

## 2018-08-09 NOTE — ED Notes (Signed)
ED TO INPATIENT HANDOFF REPORT  ED Nurse Name and Phone #: Ben [phone]  S Name/Age/Gender Ellen [name] 58 y.o. female Room/Bed: 019C/019C  Code Status   Code Status: Prior  Home/SNF/Other Home Patient oriented to: self, place, time and situation Is this baseline? Yes   Triage Complete: Triage complete  Chief Complaint CHEST AND BACK PAIN  Triage Note BIB [facility] from home with c/o of non-radiating chest pain starting yesterday. Pt states CP follows episodes of back spasms. Also reports bilateral leg pain since back surgery 3 mo ago. Received 325 asp PTA.    Allergies Allergies  Allergen Reactions  . Codeine Itching    Only happened one time and did not occur last time patient was in the hospital. (??)    Level of Care/Admitting Diagnosis ED Disposition    ED Disposition Condition [place]: [place] [100100]  Level of Care: Telemetry Cardiac [103]  I expect the patient will be discharged within 24 hours: Yes  LOW acuity---Tx typically complete <24 hrs---ACUTE conditions typically can be evaluated <24 hours---LABS likely to return to acceptable levels <24 hours---IS near functional baseline---EXPECTED to return to current living arrangement---NOT newly hypoxic: Meets criteria for 5C-Observation unit  Covid Evaluation: N/A  Diagnosis: Chest pain [510258]  Admitting Physician: Elwyn Reach [[id number]]  Attending Physician: Elwyn Reach [[id number]]  PT Class (Do Not Modify): Observation [104]  PT Acc Code (Do Not Modify): Observation [10022]       B Medical/Surgery History Past Medical History:  Diagnosis Date  . Bronchitis    hx of  . Degenerative joint disease   . Depression   . ENDOMETRIAL POLYP 01/08/2006   Dr. Agnes Lawrence.  2001:  D&C, benign;  07/14/10:  D&C, hysteroscopy, polypectomy and bx of mons pubis (peau d'orange)...... 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  Marland Kitchen GERD  (gastroesophageal reflux disease)   . Hernia   . High cholesterol   . Hypertension   . Lipoma   . Obesity   . Sleep apnea    uses cpap   Past Surgical History:  Procedure Laterality Date  . CHOLECYSTECTOMY    . DILATION AND CURETTAGE OF UTERUS    . LUMBAR LAMINECTOMY/DECOMPRESSION MICRODISCECTOMY N/A 06/19/2018   Procedure: THORACIC TEN-THORACIC ELEVEN  LAMINECTOMY;  Surgeon: Consuella Lose, MD;  Location: [facility];  Service: Neurosurgery;  Laterality: N/A;  posterior  . resection of lipoma    . UMBILICAL HERNIA REPAIR       A IV Location/Drains/Wounds Patient Lines/Drains/Airways Status   Active Line/Drains/Airways    Name:   Placement date:   Placement time:   Site:   Days:   Peripheral IV 08/09/18 Right Antecubital   08/09/18    0029    Antecubital   less than 1   External Urinary Catheter   06/18/18    1737    -   52   Incision (Closed) 06/19/18 Back Other (Comment)   06/19/18    1026     51   Pressure Injury 05/30/18 Stage I -  Intact skin with non-blanchable redness of a localized area usually over a bony prominence.   05/30/18    1502     71          Intake/Output Last 24 hours No intake or output data in the 24 hours ending 08/09/18 5277  Labs/Imaging Results for orders placed or performed during the hospital encounter of 08/09/18 (from the  past 48 hour(s))  CBC with Differential/Platelet     Status: None   Collection Time: 08/09/18 12:28 AM  Result Value Ref Range   WBC 8.4 4.0 - 10.5 K/uL   RBC 4.55 3.87 - 5.11 MIL/uL   Hemoglobin 13.6 12.0 - 15.0 g/dL   HCT 43.2 36.0 - 46.0 %   MCV 94.9 80.0 - 100.0 fL   MCH 29.9 26.0 - 34.0 pg   MCHC 31.5 30.0 - 36.0 g/dL   RDW 11.8 11.5 - 15.5 %   Platelets 309 150 - 400 K/uL   nRBC 0.0 0.0 - 0.2 %   Neutrophils Relative % 51 %   Neutro Abs 4.2 1.7 - 7.7 K/uL   Lymphocytes Relative 37 %   Lymphs Abs 3.1 0.7 - 4.0 K/uL   Monocytes Relative 9 %   Monocytes Absolute 0.8 0.1 - 1.0 K/uL   Eosinophils Relative 3 %    Eosinophils Absolute 0.2 0.0 - 0.5 K/uL   Basophils Relative 0 %   Basophils Absolute 0.0 0.0 - 0.1 K/uL   Immature Granulocytes 0 %   Abs Immature Granulocytes 0.02 0.00 - 0.07 K/uL    Comment: Performed at [facility], [address], [place], [place] [address]  Comprehensive metabolic panel     Status: Abnormal   Collection Time: 08/09/18 12:28 AM  Result Value Ref Range   Sodium 137 135 - 145 mmol/L   Potassium 3.9 3.5 - 5.1 mmol/L   Chloride 103 98 - 111 mmol/L   CO2 22 22 - 32 mmol/L   Glucose, Bld 112 (H) 70 - 99 mg/dL   BUN 11 6 - 20 mg/dL   Creatinine, Ser 0.56 0.44 - 1.00 mg/dL   Calcium 11.8 (H) 8.9 - 10.3 mg/dL   Total Protein 7.2 6.5 - 8.1 g/dL   Albumin 3.4 (L) 3.5 - 5.0 g/dL   AST 16 15 - 41 U/L   ALT 11 0 - 44 U/L   Alkaline Phosphatase 96 38 - 126 U/L   Total Bilirubin 0.5 0.3 - 1.2 mg/dL   GFR calc non Af Amer >60 >60 mL/min   GFR calc Af Amer >60 >60 mL/min   Anion gap 12 5 - 15    Comment: Performed at [facility] [address], [place], [place] [address]  Troponin I - ONCE - STAT     Status: Abnormal   Collection Time: 08/09/18 12:28 AM  Result Value Ref Range   Troponin I 0.08 (HH) <0.03 ng/mL    Comment: CRITICAL RESULT CALLED TO, READ BACK BY AND VERIFIED WITH: Kutter Schnepf B,RN 08/09/18 0118 WAYK Performed at [facility], [place] [address], [place], [place] [address]   D-dimer, quantitative (not at [facility])     Status: Abnormal   Collection Time: 08/09/18 12:28 AM  Result Value Ref Range   D-Dimer, Quant 1.86 (H) 0.00 - 0.50 ug/mL-FEU    Comment: (NOTE) At the manufacturer cut-off of 0.50 ug/mL FEU, this assay has been documented to exclude PE with a sensitivity and negative predictive value of 97 to 99%.  At this time, this assay has not been approved by the FDA to exclude DVT/VTE. Results should be correlated with clinical presentation. Performed at [facility], [place] [address] [place], [place] [address]    Ct Angio Chest  Pe W And/or Wo Contrast  Result Date: 08/09/2018 CLINICAL DATA:  58 y/o F; nonradiating chest pain. PE suspected, intermediate prob, positive D-dimer.  EXAM: CT ANGIOGRAPHY CHEST WITH CONTRAST TECHNIQUE: Multidetector CT imaging of the chest was performed using the standard protocol during bolus administration of intravenous contrast. Multiplanar CT image reconstructions and MIPs were obtained to evaluate the vascular anatomy. CONTRAST:  110mL OMNIPAQUE IOHEXOL 300 MG/ML  SOLN COMPARISON:  05/30/2018 CTA of the chest. FINDINGS: Cardiovascular: Mild respiratory motion artifact. Satisfactory opacification of the pulmonary arteries to the segmental level. No pulmonary embolus identified. Normal heart size. No pericardial effusion. Mediastinum/Nodes: No enlarged mediastinal, hilar, or axillary lymph nodes. Thyroid gland, trachea, and esophagus demonstrate no significant findings. Lungs/Pleura: Mosaic attenuation of the lungs. No consolidation, effusion, or pneumothorax. Upper Abdomen: No acute abnormality. Musculoskeletal: Multilevel discogenic degenerative changes of the thoracic spine. No acute osseous abnormality is evident. Review of the MIP images confirms the above findings. IMPRESSION: 1. Mild respiratory motion artifact. No pulmonary embolus identified. 2. Mosaic attenuation of the lungs probably represents small airways disease and air trapping. Electronically Signed   By: Kristine Garbe M.D.   On: 08/09/2018 04:40   Dg Chest Port 1 View  Result Date: 08/09/2018 CLINICAL DATA:  Chest pain. EXAM: PORTABLE CHEST 1 VIEW COMPARISON:  06/24/2018 FINDINGS: The cardiomediastinal contours are unchanged. The lungs are clear. Pulmonary vasculature is normal. No consolidation, pleural effusion, or pneumothorax. No acute osseous abnormalities are seen. IMPRESSION: No acute chest findings. Electronically Signed   By: Keith Rake M.D.   On: 08/09/2018 00:57    Pending Labs Unresulted Labs (From  admission, onward)    Start     Ordered   08/09/18 0029  Urinalysis, Routine w reflex microscopic  Once,   STAT     08/09/18 0028   Signed and Held  CBC  (enoxaparin (LOVENOX) full dose)  Once,   R    Comments: Baseline for enoxaparin therapy IF NOT ALREADY DRAWN.  Notify MD if PLT < 100 K.    Signed and Held   Signed and Held  Creatinine, serum  (enoxaparin (LOVENOX) full dose)  Once,   R    Comments: Baseline for enoxaparin therapy IF NOT ALREADY DRAWN.    Signed and Held   Signed and Held  Creatinine, serum  (enoxaparin (LOVENOX) full dose)  Weekly,   R    Comments: while on enoxaparin therapy.    Signed and Held   Signed and Held  Troponin I - Now Then Q3H  Now then every 3 hours,   TIMED     Signed and Held          Vitals/Pain Today's Vitals   08/09/18 0330 08/09/18 0332 08/09/18 0345 08/09/18 0355  BP: 133/83 131/81 119/67   Pulse:  91    Resp: 17 (!) 30 18   Temp:      TempSrc:      SpO2:  100%    Weight:      Height:      PainSc:    2     Isolation Precautions No active isolations  Medications Medications  fentaNYL (SUBLIMAZE) injection 100 mcg (100 mcg Intravenous Given 08/09/18 0048)  fentaNYL (SUBLIMAZE) injection 50 mcg (50 mcg Intravenous Given 08/09/18 0332)  iohexol (OMNIPAQUE) 300 MG/ML solution 100 mL (100 mLs Intravenous Contrast Given 08/09/18 0407)    Mobility non-ambulatory Moderate fall risk   Focused Assessments Cardiac Assessment Handoff:  Cardiac Rhythm: Normal sinus rhythm Lab Results  Component Value Date   CKTOTAL 88 10/01/2007   TROPONINI 0.08 (HH) 08/09/2018   Lab Results  Component Value Date  DDIMER 1.86 (H) 08/09/2018   Does the Patient currently have chest pain? No     R Recommendations: See Admitting Provider Note  Report given to:   Additional Notes: Pt nonambulatory and bed-bound since March with home health orders since surgery 3 mo. Ago.

## 2018-08-10 ENCOUNTER — Other Ambulatory Visit (HOSPITAL_COMMUNITY): Payer: Medicare HMO

## 2018-08-10 DIAGNOSIS — N319 Neuromuscular dysfunction of bladder, unspecified: Secondary | ICD-10-CM | POA: Diagnosis present

## 2018-08-10 DIAGNOSIS — R079 Chest pain, unspecified: Secondary | ICD-10-CM | POA: Diagnosis present

## 2018-08-10 DIAGNOSIS — N3000 Acute cystitis without hematuria: Secondary | ICD-10-CM | POA: Diagnosis present

## 2018-08-10 DIAGNOSIS — R338 Other retention of urine: Secondary | ICD-10-CM | POA: Diagnosis not present

## 2018-08-10 DIAGNOSIS — F329 Major depressive disorder, single episode, unspecified: Secondary | ICD-10-CM | POA: Diagnosis present

## 2018-08-10 DIAGNOSIS — Z7401 Bed confinement status: Secondary | ICD-10-CM | POA: Diagnosis not present

## 2018-08-10 DIAGNOSIS — Z9119 Patient's noncompliance with other medical treatment and regimen: Secondary | ICD-10-CM | POA: Diagnosis not present

## 2018-08-10 DIAGNOSIS — Z7982 Long term (current) use of aspirin: Secondary | ICD-10-CM | POA: Diagnosis not present

## 2018-08-10 DIAGNOSIS — Z8249 Family history of ischemic heart disease and other diseases of the circulatory system: Secondary | ICD-10-CM | POA: Diagnosis not present

## 2018-08-10 DIAGNOSIS — G8220 Paraplegia, unspecified: Secondary | ICD-10-CM | POA: Diagnosis present

## 2018-08-10 DIAGNOSIS — E119 Type 2 diabetes mellitus without complications: Secondary | ICD-10-CM | POA: Diagnosis present

## 2018-08-10 DIAGNOSIS — E7800 Pure hypercholesterolemia, unspecified: Secondary | ICD-10-CM | POA: Diagnosis present

## 2018-08-10 DIAGNOSIS — Z885 Allergy status to narcotic agent status: Secondary | ICD-10-CM | POA: Diagnosis not present

## 2018-08-10 DIAGNOSIS — I248 Other forms of acute ischemic heart disease: Secondary | ICD-10-CM | POA: Diagnosis present

## 2018-08-10 DIAGNOSIS — K219 Gastro-esophageal reflux disease without esophagitis: Secondary | ICD-10-CM | POA: Diagnosis present

## 2018-08-10 DIAGNOSIS — I10 Essential (primary) hypertension: Secondary | ICD-10-CM | POA: Diagnosis present

## 2018-08-10 DIAGNOSIS — Z809 Family history of malignant neoplasm, unspecified: Secondary | ICD-10-CM | POA: Diagnosis not present

## 2018-08-10 DIAGNOSIS — G4733 Obstructive sleep apnea (adult) (pediatric): Secondary | ICD-10-CM | POA: Diagnosis present

## 2018-08-10 DIAGNOSIS — Z6841 Body Mass Index (BMI) 40.0 and over, adult: Secondary | ICD-10-CM | POA: Diagnosis not present

## 2018-08-10 DIAGNOSIS — N390 Urinary tract infection, site not specified: Secondary | ICD-10-CM | POA: Diagnosis not present

## 2018-08-10 DIAGNOSIS — E785 Hyperlipidemia, unspecified: Secondary | ICD-10-CM | POA: Diagnosis present

## 2018-08-10 DIAGNOSIS — Z833 Family history of diabetes mellitus: Secondary | ICD-10-CM | POA: Diagnosis not present

## 2018-08-10 DIAGNOSIS — E782 Mixed hyperlipidemia: Secondary | ICD-10-CM | POA: Diagnosis not present

## 2018-08-10 DIAGNOSIS — Z20828 Contact with and (suspected) exposure to other viral communicable diseases: Secondary | ICD-10-CM | POA: Diagnosis present

## 2018-08-10 DIAGNOSIS — I447 Left bundle-branch block, unspecified: Secondary | ICD-10-CM | POA: Diagnosis present

## 2018-08-10 DIAGNOSIS — R0789 Other chest pain: Secondary | ICD-10-CM | POA: Diagnosis not present

## 2018-08-10 DIAGNOSIS — M1990 Unspecified osteoarthritis, unspecified site: Secondary | ICD-10-CM | POA: Diagnosis present

## 2018-08-10 LAB — NOVEL CORONAVIRUS, NAA (HOSP ORDER, SEND-OUT TO REF LAB; TAT 18-24 HRS): SARS-CoV-2, NAA: NOT DETECTED

## 2018-08-10 NOTE — Evaluation (Signed)
Physical Therapy Evaluation Patient Details Name: Ellen [name] MRN: [id number] DOB: 1960/12/20 Today's Date: 08/10/2018   History of Present Illness  58 y.o. female admitted on 08/09/18 for atypical chest pain suspected musculoskeletal etiology, hypercalcemia, acute urinary retention/UTI.  Pt with significant PMH of T10-11 decompressive laminectomy 06/19/18 with parapelgia, obesity.    Clinical Impression  Pt was able to come to sitting EOB with me with use of bed and rails with mod assist overall.  She has poor trunk control and spastic LEs with diminished sensation throughout both.  She reports she wants to try for more daily rehab to help her work on her goal of slide board transfers to the [facility].   PT to follow acutely for deficits listed below.      Follow Up Recommendations SNF    Equipment Recommendations  None recommended by PT    Recommendations for Other Services   NA    Precautions / Restrictions Precautions Precautions: Fall Precaution Comments: parapelegia with decreased trunk control.  Restrictions Weight Bearing Restrictions: No RLE Weight Bearing: Non weight bearing LLE Weight Bearing: Non weight bearing      Mobility  Bed Mobility Overal bed mobility: Needs Assistance Bed Mobility: Rolling;Supine to Sit;Sit to Supine Rolling: Mod assist   Supine to sit: Mod assist;HOB elevated Sit to supine: Mod assist   General bed mobility comments: Mod assist to mostly manage her legs and pelvis during bed mobility and transfers, pt is very strong in her UEs and manages her trunk well with use of bed rails and her arms.   Transfers                 General transfer comment: NT  Ambulation/Gait             General Gait Details: unable      Balance Overall balance assessment: Needs assistance Sitting-balance support: Feet supported;Bilateral upper extremity supported Sitting balance-Leahy Scale: Poor Sitting balance - Comments: needs min assist in  sitting EOB.  We sat ~10 mins working on sitting balance removing one hand and then the other hand attempting to work up the courage to remove both hands, but pt unable.   Postural control: (pt tends to shoot forward.)                                   Pertinent Vitals/Pain Pain Assessment: No/denies pain    Home Living Family/patient expects to be discharged to:: Private residence Living Arrangements: Spouse/significant other Available Help at Discharge: Family;Available 24 hours/day;Personal care attendant Type of Home: House       Home Layout: One level Home Equipment: Shower seat;Bedside commode;Wheelchair - Therapist, music) Additional Comments: active with HHPT/OT/aide, RN    Prior Function Level of Independence: Needs assistance   Gait / Transfers Assistance Needed: Pt reports she sits EOB with her home PT , uses lift to get to [facility],  ADL's / Homemaking Assistance Needed: bathes in the bed, has a Amo aide, total care for cooking/cleaning.         Hand Dominance   Dominant Hand: Right    Extremity/Trunk Assessment   Upper Extremity Assessment Upper Extremity Assessment: Overall WFL for tasks assessed    Lower Extremity Assessment Lower Extremity Assessment: RLE deficits/detail;LLE deficits/detail RLE Deficits / Details: bil LEs with spasms, decreased sensation to LT but not absent difficulty localizing accurately, but able to tell me if I am touching R  vs L leg.  Flexion synergy spasms at times and extension synergy spasms at times.  LLE Deficits / Details: bil LEs with spasms, decreased sensation to LT but not absent difficulty localizing accurately, but able to tell me if I am touching R vs L leg.  Flexion synergy spasms at times and extension synergy spasms at times.     Cervical / Trunk Assessment Cervical / Trunk Assessment: Other exceptions Cervical / Trunk Exceptions: decreased trunk strength as she is heavily reliant on bil UEs to  maintain sitting balance EOB.    Communication   Communication: No difficulties  Cognition Arousal/Alertness: Awake/alert Behavior During Therapy: WFL for tasks assessed/performed Overall Cognitive Status: No family/caregiver present to determine baseline cognitive functioning                                 General Comments: Pt at times having to pause to think of what she wants to say (almost retrieval problems)      General Comments General comments (skin integrity, edema, etc.): Positioned on L side, extra help needed to get her higher in the bed once supine.         Assessment/Plan    PT Assessment Patient needs continued PT services  PT Problem List Decreased strength;Decreased activity tolerance;Decreased balance;Decreased mobility;Decreased coordination;Decreased knowledge of use of DME;Decreased knowledge of precautions;Impaired sensation;Obesity       PT Treatment Interventions DME instruction;Functional mobility training;Therapeutic activities;Therapeutic exercise;Balance training;Patient/family education;Wheelchair mobility training;Neuromuscular re-education;Cognitive remediation    PT Goals (Current goals can be found in the Care Plan section)  Acute Rehab PT Goals Patient Stated Goal: to get more daily rehab PT Goal Formulation: With patient Time For Goal Achievement: 08/24/18 Potential to Achieve Goals: Good    Frequency Min 2X/week           AM-PAC PT "6 Clicks" Mobility  Outcome Measure Help needed turning from your back to your side while in a flat bed without using bedrails?: A Lot Help needed moving from lying on your back to sitting on the side of a flat bed without using bedrails?: A Lot Help needed moving to and from a bed to a chair (including a wheelchair)?: Total Help needed standing up from a chair using your arms (e.g., wheelchair or bedside chair)?: Total Help needed to walk in hospital room?: Total Help needed climbing 3-5  steps with a railing? : Total 6 Click Score: 8    End of Session   Activity Tolerance: Patient limited by fatigue Patient left: in bed;with call bell/phone within reach   PT Visit Diagnosis: Muscle weakness (generalized) (M62.81);Difficulty in walking, not elsewhere classified (R26.2);Other symptoms and signs involving the nervous system (I50.277)    Time: 1740-1811 PT Time Calculation (min) (ACUTE ONLY): 31 min   Charges:         Wells Guiles B. Arhan Mcmanamon, PT, DPT  Acute Rehabilitation [phone] pager #[phone] office  @ [facility]: [phone]   PT Evaluation $PT Eval Moderate Complexity: 1 Mod PT Treatments $Therapeutic Activity: 8-22 mins       08/10/2018, 6:34 PM

## 2018-08-10 NOTE — Progress Notes (Signed)
Pt has 877 cc residual urine. MD notified, in and out cath done, orders given for bladders scan and in & out cath q 8hr. Insart foley catheter after 3 attempts.  Echo Tech notified about patients ECHO procedure, Tech said "ECHO can not be performed at this time due to pt's COVID-19 test pending. MD notofied

## 2018-08-10 NOTE — Progress Notes (Signed)
PROGRESS NOTE   Ellen [name]  XLK:[id number]    DOB: [birth year]    DOA: 08/09/2018  PCP: Deborah Chalk, FNP   I have briefly reviewed patients previous medical records in [facility].  Brief Narrative:  58 year old female, lives with her fianc and his brother, nonambulatory reportedly since March 2020, PMH of HTN, HLD, GERD, OSA noncompliant with CPAP, morbid obesity, paraplegia and depression presented to ED on 08/09/2018 with atypical chest pain, without acute EKG changes, minimally elevated troponin with flat trend.  Cardiology consulted.  Hospital course complicated by acute urinary retention and complicated acute cystitis.   Assessment & Plan:   Principal Problem:   Chest pain Active Problems:   Hyperlipidemia   Obesity, morbid, BMI 50 or higher (HCC)   Essential hypertension   GERD   OSA (obstructive sleep apnea)   Paraplegia (HCC)   Atypical chest pain   Atypical chest pain  Patient has multiple CAD risk factors including HTN, HLD, morbid obesity, family history of reported MI and death in mother in her 45s.  Presented with atypical chest pain, suspicious for musculoskeletal etiology.  EKG shows sinus rhythm with?  Partial LBBB morphology but no acute changes.  CTA chest negative for pulmonary embolism.  Troponin minimally elevated with flat trend, 0.08 > 0.08 >0.07 > 0.07.  Chest pain reproducible to palpation.  Continue aspirin and statins.  Cardiology input appreciated, awaiting negative COVID-19 test for echocardiogram.  If echo without abnormal findings then will be evaluated as outpatient.  Low index of suspicion for ACS, hence changed full dose Lovenox to DVT prophylactic dose.  No recurrence of chest pain since 6/20 morning.  Essential hypertension  Controlled on enalapril 20 mg daily.  Hyperlipidemia  Continue atorvastatin 20 mg daily.  Morbid obesity/Body mass index is 58.24 kg/m.  OSA noncompliant with CPAP  Hypercalcemia   Mild and asymptomatic.  Unclear etiology.  Not on calcium supplements but was on high-dose vitamin D supplements which are currently held.  Intact PTH, PTH RP 06/18/2018 were WNL.  SPEP/IFE 06/18/2018: Immunofixation pattern unremarkable.  No monoclonal protein noted.  DC vitamin D supplements until outpatient follow-up with PCP with repeat CMP and consider vitamin D levels if not recently done.  Encourage oral fluid intake.  Follow BMP in a.m.  GERD  Continue PPI.  Chronic paraplegia, s/p T10-T11 decompressive laminectomy 06/19/2018  Bedbound at home, gets PT and OT reportedly  States that she has been working on upper extremity strength which may have contributed to her musculoskeletal chest wall pain.  Continue pregabalin for neuropathy  Added baclofen for discomforting lower extremity spasms.  Spasms are better.  Patient asking if she can go to rehab.  PT and CSW consulted.  Acute urinary retention  May be related to acute cystitis complicating paraplegia  Had to undergo in and out catheterization yesterday and today.  If ongoing urinary retention, may have to Place Foley catheter and leave it in until outpatient follow-up with urology.  Held Ditropan XL which can also contribute to urinary retention.  Treat UTI as below.  Acute complicated gram-negative cystitis  Patient has acute lower UTI symptoms.  Urine microscopy suggestive of UTI.  Preliminary urine culture shows greater than 100 K of gram-negative rods  UTI complicating underlying neurogenic bladder.  Not sure if she has issues with incomplete bladder emptying chronically.  Continue IV ceftriaxone pending urine culture results.   DVT prophylaxis: Lovenox Code Status: Full Family Communication: None at bedside Disposition: Patient has symptomatic  acute complicated cystitis and needs to be on IV antibiotics to adequately treat especially in the context of ongoing acute urinary retention.  We also need to  ensure that her urine culture does not show resistant bacteria.  We also need to determine whether she needs indwelling Foley catheter due to ongoing retention issues.  Also awaiting final input from Cardiology regarding chest pain work-up.  Thereby changed patient to inpatient status on 6/21.   Consultants:  Cardiology  Procedures:  In and out urinary catheterization.  Antimicrobials:  None   Subjective: No recurrence of chest pain since yesterday morning.  Still has some intermittent lower abdominal pressure/low back pain and dysuria but better than yesterday, however not back to normal.  Lower extremity spasms are better.  Asking if she can go to rehab.  ROS: As above, otherwise negative.  Objective:  Vitals:   08/09/18 1546 08/09/18 1942 08/10/18 0431 08/10/18 1050  BP: 119/61 140/75 110/70 127/71  Pulse: 85 97 (!) 101 (!) 101  Resp:  18 18 (!) 22  Temp: 98.1 F (36.7 C) 98.6 F (37 C) 98.6 F (37 C)   TempSrc: Oral Oral Oral   SpO2:  98% 98% 99%  Weight:      Height:        Examination:  General exam: Pleasant young female, moderately built and morbidly obese lying comfortably propped up in bed without distress. Respiratory system: Clear to auscultation. Respiratory effort normal.  Reproducible midsternal chest wall tenderness, less prominent today. Cardiovascular system: S1 & S2 heard, RRR. No JVD, murmurs, rubs, gallops or clicks. No pedal edema.  Telemetry personally reviewed: SR in the 90s-ST in the 100s. Gastrointestinal system: Abdomen is nondistended, soft and nontender. No organomegaly or masses felt. Normal bowel sounds heard.  No palpable bladder although difficult to tell given obesity. Central nervous system: Alert and oriented. No focal neurological deficits. Extremities: Symmetric 5 x 5 power in upper extremities and grade 0 x 5 power in lower extremities.  No spasms noted on exam today. Skin: No rashes, lesions or ulcers Psychiatry: Judgement and  insight appear normal. Mood & affect appropriate.     Data Reviewed: I have personally reviewed following labs and imaging studies  CBC: Recent Labs  Lab 08/09/18 0028 08/09/18 0731  WBC 8.4 7.0  NEUTROABS 4.2  --   HGB 13.6 12.9  HCT 43.2 40.1  MCV 94.9 94.4  PLT 309 416   Basic Metabolic Panel: Recent Labs  Lab 08/09/18 0028 08/09/18 0731 08/09/18 1301  NA 137  --  140  K 3.9  --  4.1  CL 103  --  107  CO2 22  --  23  GLUCOSE 112*  --  95  BUN 11  --  9  CREATININE 0.56 0.47 0.45  CALCIUM 11.8*  --  11.5*   Liver Function Tests: Recent Labs  Lab 08/09/18 0028  AST 16  ALT 11  ALKPHOS 96  BILITOT 0.5  PROT 7.2  ALBUMIN 3.4*   Coagulation Profile: No results for input(s): INR, PROTIME in the last 168 hours. Cardiac Enzymes: Recent Labs  Lab 08/09/18 0028 08/09/18 0731 08/09/18 1015 08/09/18 1301  TROPONINI 0.08* 0.08* 0.07* 0.07*   HbA1C: No results for input(s): HGBA1C in the last 72 hours. CBG: No results for input(s): GLUCAP in the last 168 hours.  Recent Results (from the past 240 hour(s))  Culture, Urine     Status: Abnormal (Preliminary result)   Collection Time: 08/09/18  3:30 PM  Specimen: Urine, Random  Result Value Ref Range Status   Specimen Description URINE, RANDOM  Final   Special Requests   Final    NONE Performed at [facility], [address] [place], [place] [address]    Culture >=100,000 COLONIES/mL GRAM NEGATIVE RODS (A)  Final   Report Status PENDING  Incomplete         Radiology Studies: Ct Angio Chest Pe W And/or Wo Contrast  Result Date: 08/09/2018 CLINICAL DATA:  58 y/o F; nonradiating chest pain. PE suspected, intermediate prob, positive D-dimer. EXAM: CT ANGIOGRAPHY CHEST WITH CONTRAST TECHNIQUE: Multidetector CT imaging of the chest was performed using the standard protocol during bolus administration of intravenous contrast. Multiplanar CT image reconstructions and MIPs were obtained to evaluate the  vascular anatomy. CONTRAST:  117mL OMNIPAQUE IOHEXOL 300 MG/ML  SOLN COMPARISON:  05/30/2018 CTA of the chest. FINDINGS: Cardiovascular: Mild respiratory motion artifact. Satisfactory opacification of the pulmonary arteries to the segmental level. No pulmonary embolus identified. Normal heart size. No pericardial effusion. Mediastinum/Nodes: No enlarged mediastinal, hilar, or axillary lymph nodes. Thyroid gland, trachea, and esophagus demonstrate no significant findings. Lungs/Pleura: Mosaic attenuation of the lungs. No consolidation, effusion, or pneumothorax. Upper Abdomen: No acute abnormality. Musculoskeletal: Multilevel discogenic degenerative changes of the thoracic spine. No acute osseous abnormality is evident. Review of the MIP images confirms the above findings. IMPRESSION: 1. Mild respiratory motion artifact. No pulmonary embolus identified. 2. Mosaic attenuation of the lungs probably represents small airways disease and air trapping. Electronically Signed   By: Kristine Garbe M.D.   On: 08/09/2018 04:40   Dg Chest Port 1 View  Result Date: 08/09/2018 CLINICAL DATA:  Chest pain. EXAM: PORTABLE CHEST 1 VIEW COMPARISON:  06/24/2018 FINDINGS: The cardiomediastinal contours are unchanged. The lungs are clear. Pulmonary vasculature is normal. No consolidation, pleural effusion, or pneumothorax. No acute osseous abnormalities are seen. IMPRESSION: No acute chest findings. Electronically Signed   By: Keith Rake M.D.   On: 08/09/2018 00:57        Scheduled Meds: . aspirin  81 mg Oral Daily  . atorvastatin  20 mg Oral q1800  . docusate sodium  100 mg Oral BID  . enalapril  20 mg Oral Daily  . enoxaparin (LOVENOX) injection  0.5 mg/kg Subcutaneous Q24H  . escitalopram  10 mg Oral Daily  . pantoprazole  40 mg Oral Daily  . pregabalin  75 mg Oral TID  . senna  1 tablet Oral BID  . vitamin B-12  1,000 mcg Oral Daily   Continuous Infusions: . cefTRIAXone (ROCEPHIN)  IV 1 g  (08/09/18 1722)     LOS: 0 days     Vernell Leep, MD, FACP, [facility]. [facility]  To contact the attending provider between 7A-7P or the covering provider during after hours 7P-7A, please log into the web site [email or web address] and access using universal [place] password for that web site. If you do not have the password, please call the hospital operator.  08/10/2018, 12:35 PM

## 2018-08-10 NOTE — Progress Notes (Signed)
DAILY PROGRESS NOTE   Patient Name: Ellen [name] Date of Encounter: 08/10/2018 Cardiologist: No primary care provider on file.  Chief Complaint   Urinary retention  Patient Profile   58 yo female with HTN, HLD, morbid obesity, OSA on CPAP and GERD, presents with chest pain.  Subjective   No chest pain overnight. Awaiting echo - which is on hold due to pending COVID-19 study. Started on baclofen for spasm and Rocephin for possible UTI with some improvement.  Objective   Vitals:   08/09/18 0637 08/09/18 1546 08/09/18 1942 08/10/18 0431  BP: 119/67 119/61 140/75 110/70  Pulse: 91 85 97 (!) 101  Resp: 12  18 18   Temp: 98.5 F (36.9 C) 98.1 F (36.7 C) 98.6 F (37 C) 98.6 F (37 C)  TempSrc: Oral Oral Oral Oral  SpO2: 97%  98% 98%  Weight:      Height:        Intake/Output Summary (Last 24 hours) at 08/10/2018 1034 Last data filed at 08/10/2018 0431 Gross per 24 hour  Intake 880 ml  Output 2000 ml  Net -1120 ml   Filed Weights   08/09/18 0016 08/09/18 0629  Weight: (!) 149.7 kg (!) 153.9 kg    Physical Exam   General appearance: alert, no distress and morbidly obese Lungs: clear to auscultation bilaterally Heart: regular rate and rhythm Extremities: extremities normal, atraumatic, no cyanosis or edema Neurologic: Mental status: Alert, oriented, thought content appropriate, strength 0/5 bilateral LE, spastic contracture  Inpatient Medications    Scheduled Meds: . aspirin  81 mg Oral Daily  . atorvastatin  20 mg Oral q1800  . docusate sodium  100 mg Oral BID  . enalapril  20 mg Oral Daily  . enoxaparin (LOVENOX) injection  0.5 mg/kg Subcutaneous Q24H  . escitalopram  10 mg Oral Daily  . pantoprazole  40 mg Oral Daily  . pregabalin  75 mg Oral TID  . senna  1 tablet Oral BID  . vitamin B-12  1,000 mcg Oral Daily    Continuous Infusions: . cefTRIAXone (ROCEPHIN)  IV 1 g (08/09/18 1722)    PRN Meds: acetaminophen, baclofen, diclofenac sodium,  ondansetron (ZOFRAN) IV, polyethylene glycol, traMADol   Labs   Results for orders placed or performed during the hospital encounter of 08/09/18 (from the past 48 hour(s))  CBC with Differential/Platelet     Status: None   Collection Time: 08/09/18 12:28 AM  Result Value Ref Range   WBC 8.4 4.0 - 10.5 K/uL   RBC 4.55 3.87 - 5.11 MIL/uL   Hemoglobin 13.6 12.0 - 15.0 g/dL   HCT 43.2 36.0 - 46.0 %   MCV 94.9 80.0 - 100.0 fL   MCH 29.9 26.0 - 34.0 pg   MCHC 31.5 30.0 - 36.0 g/dL   RDW 11.8 11.5 - 15.5 %   Platelets 309 150 - 400 K/uL   nRBC 0.0 0.0 - 0.2 %   Neutrophils Relative % 51 %   Neutro Abs 4.2 1.7 - 7.7 K/uL   Lymphocytes Relative 37 %   Lymphs Abs 3.1 0.7 - 4.0 K/uL   Monocytes Relative 9 %   Monocytes Absolute 0.8 0.1 - 1.0 K/uL   Eosinophils Relative 3 %   Eosinophils Absolute 0.2 0.0 - 0.5 K/uL   Basophils Relative 0 %   Basophils Absolute 0.0 0.0 - 0.1 K/uL   Immature Granulocytes 0 %   Abs Immature Granulocytes 0.02 0.00 - 0.07 K/uL    Comment: Performed  at [facility], [place] [address], [place], [place] [address]  Comprehensive metabolic panel     Status: Abnormal   Collection Time: 08/09/18 12:28 AM  Result Value Ref Range   Sodium 137 135 - 145 mmol/L   Potassium 3.9 3.5 - 5.1 mmol/L   Chloride 103 98 - 111 mmol/L   CO2 22 22 - 32 mmol/L   Glucose, Bld 112 (H) 70 - 99 mg/dL   BUN 11 6 - 20 mg/dL   Creatinine, Ser 0.56 0.44 - 1.00 mg/dL   Calcium 11.8 (H) 8.9 - 10.3 mg/dL   Total Protein 7.2 6.5 - 8.1 g/dL   Albumin 3.4 (L) 3.5 - 5.0 g/dL   AST 16 15 - 41 U/L   ALT 11 0 - 44 U/L   Alkaline Phosphatase 96 38 - 126 U/L   Total Bilirubin 0.5 0.3 - 1.2 mg/dL   GFR calc non Af Amer >60 >60 mL/min   GFR calc Af Amer >60 >60 mL/min   Anion gap 12 5 - 15    Comment: Performed at [place] [address] [place], [place] [address]  Troponin I - ONCE - STAT     Status: Abnormal   Collection Time: 08/09/18 12:28 AM  Result Value Ref Range    Troponin I 0.08 (HH) <0.03 ng/mL    Comment: CRITICAL RESULT CALLED TO, READ BACK BY AND VERIFIED WITH: BAILIFF B,RN 08/09/18 0118 WAYK Performed at [facility], [place] [address] [place], [place] [address]   D-dimer, quantitative (not at [facility])     Status: Abnormal   Collection Time: 08/09/18 12:28 AM  Result Value Ref Range   D-Dimer, Quant 1.86 (H) 0.00 - 0.50 ug/mL-FEU    Comment: (NOTE) At the manufacturer cut-off of 0.50 ug/mL FEU, this assay has been documented to exclude PE with a sensitivity and negative predictive value of 97 to 99%.  At this time, this assay has not been approved by the FDA to exclude DVT/VTE. Results should be correlated with clinical presentation. Performed at [facility], [facility] [address] [place], [place] [address]   CBC     Status: None   Collection Time: 08/09/18  7:31 AM  Result Value Ref Range   WBC 7.0 4.0 - 10.5 K/uL   RBC 4.25 3.87 - 5.11 MIL/uL   Hemoglobin 12.9 12.0 - 15.0 g/dL   HCT 40.1 36.0 - 46.0 %   MCV 94.4 80.0 - 100.0 fL   MCH 30.4 26.0 - 34.0 pg   MCHC 32.2 30.0 - 36.0 g/dL   RDW 11.9 11.5 - 15.5 %   Platelets 288 150 - 400 K/uL   nRBC 0.0 0.0 - 0.2 %    Comment: Performed at [facility], [place] [address] [place], [place] [address]  Creatinine, serum     Status: None   Collection Time: 08/09/18  7:31 AM  Result Value Ref Range   Creatinine, Ser 0.47 0.44 - 1.00 mg/dL   GFR calc non Af Amer >60 >60 mL/min   GFR calc Af Amer >60 >60 mL/min    Comment: Performed at [facility] [address], [place],  [address]  Troponin I - Now Then Q3H     Status: Abnormal   Collection Time: 08/09/18  7:31 AM  Result Value Ref Range   Troponin I 0.08 (HH) <0.03 ng/mL    Comment: CRITICAL VALUE NOTED.  VALUE IS CONSISTENT WITH PREVIOUSLY  REPORTED AND CALLED VALUE. Performed at [facility], [place] [address], [place], [place] [address]   Troponin I - Now Then Q3H     Status: Abnormal   Collection Time:  08/09/18 10:15 AM  Result Value Ref Range   Troponin I 0.07 (HH) <0.03 ng/mL    Comment: CRITICAL VALUE NOTED.  VALUE IS CONSISTENT WITH PREVIOUSLY REPORTED AND CALLED VALUE. Performed at [facility], [place] [address], [place], [place] [address]   Urinalysis, Routine w reflex microscopic     Status: Abnormal   Collection Time: 08/09/18 11:12 AM  Result Value Ref Range   Color, Urine YELLOW YELLOW   APPearance CLOUDY (A) CLEAR   Specific Gravity, Urine 1.041 (H) 1.005 - 1.030   pH 6.0 5.0 - 8.0   Glucose, UA NEGATIVE NEGATIVE mg/dL   Hgb urine dipstick SMALL (A) NEGATIVE   Bilirubin Urine NEGATIVE NEGATIVE   Ketones, ur NEGATIVE NEGATIVE mg/dL   Protein, ur 30 (A) NEGATIVE mg/dL   Nitrite NEGATIVE NEGATIVE   Leukocytes,Ua LARGE (A) NEGATIVE    Comment: Performed at [facility] [address], [place], [place] [address]  Urinalysis, Microscopic (reflex)     Status: Abnormal   Collection Time: 08/09/18 11:12 AM  Result Value Ref Range   RBC / HPF 6-10 0 - 5 RBC/hpf   WBC, UA >50 0 - 5 WBC/hpf   Bacteria, UA MANY (A) NONE SEEN   Squamous Epithelial / LPF 0-5 0 - 5   Ca Oxalate Crys, UA PRESENT     Comment: Performed at [facility] [address], [place], [place] [address]  Troponin I - Now Then Q3H     Status: Abnormal   Collection Time: 08/09/18  1:01 PM  Result Value Ref Range   Troponin I 0.07 (HH) <0.03 ng/mL    Comment: CRITICAL VALUE NOTED.  VALUE IS CONSISTENT WITH PREVIOUSLY REPORTED AND CALLED VALUE. Performed at [facility], [place] [address], [place], [place] [address]   Basic metabolic panel     Status: Abnormal   Collection Time: 08/09/18  1:01 PM  Result Value Ref Range   Sodium 140 135 - 145 mmol/L   Potassium 4.1 3.5 - 5.1 mmol/L   Chloride 107 98 - 111 mmol/L   CO2 23 22 - 32 mmol/L   Glucose, Bld 95 70 - 99 mg/dL   BUN 9 6 - 20 mg/dL   Creatinine, Ser 0.45 0.44 - 1.00 mg/dL   Calcium 11.5 (H) 8.9 - 10.3 mg/dL   GFR calc non Af  Amer >60 >60 mL/min   GFR calc Af Amer >60 >60 mL/min   Anion gap 10 5 - 15    Comment: Performed at [facility] [address], [place], [place] [address]    ECG   N/A  Telemetry   Sinus tach - Personally Reviewed  Radiology    Ct Angio Chest Pe W And/or Wo Contrast  Result Date: 08/09/2018 CLINICAL DATA:  58 y/o F; nonradiating chest pain. PE suspected, intermediate prob, positive D-dimer. EXAM: CT ANGIOGRAPHY CHEST WITH CONTRAST TECHNIQUE: Multidetector CT imaging of the chest was performed using the standard protocol during bolus administration of intravenous contrast. Multiplanar CT image reconstructions and MIPs were obtained to evaluate the vascular anatomy. CONTRAST:  14mL OMNIPAQUE IOHEXOL 300 MG/ML  SOLN COMPARISON:  05/30/2018 CTA of the chest. FINDINGS: Cardiovascular: Mild respiratory motion artifact. Satisfactory opacification of the pulmonary arteries to the  segmental level. No pulmonary embolus identified. Normal heart size. No pericardial effusion. Mediastinum/Nodes: No enlarged mediastinal, hilar, or axillary lymph nodes. Thyroid gland, trachea, and esophagus demonstrate no significant findings. Lungs/Pleura: Mosaic attenuation of the lungs. No consolidation, effusion, or pneumothorax. Upper Abdomen: No acute abnormality. Musculoskeletal: Multilevel discogenic degenerative changes of the thoracic spine. No acute osseous abnormality is evident. Review of the MIP images confirms the above findings. IMPRESSION: 1. Mild respiratory motion artifact. No pulmonary embolus identified. 2. Mosaic attenuation of the lungs probably represents small airways disease and air trapping. Electronically Signed   By: Kristine Garbe M.D.   On: 08/09/2018 04:40   Dg Chest Port 1 View  Result Date: 08/09/2018 CLINICAL DATA:  Chest pain. EXAM: PORTABLE CHEST 1 VIEW COMPARISON:  06/24/2018 FINDINGS: The cardiomediastinal contours are unchanged. The lungs are clear. Pulmonary  vasculature is normal. No consolidation, pleural effusion, or pneumothorax. No acute osseous abnormalities are seen. IMPRESSION: No acute chest findings. Electronically Signed   By: Keith Rake M.D.   On: 08/09/2018 00:57    Cardiac Studies   Echo pending  Assessment   1. Principal Problem: 2.   Chest pain 3. Active Problems: 4.   Hyperlipidemia 5.   Obesity, morbid, BMI 50 or higher ([place]) 6.   Essential hypertension 7.   GERD 8.   OSA (obstructive sleep apnea) 9.   Paraplegia ([facility]) 10.   Plan   1. No further chest pain. Muscle stiffness/spasm improved on baclofen. Given antibiotics for presumptive UTI. Await negative COVID-19 test for echo.  Time Spent Directly with Patient:  I have spent a total of 15  minutes with the patient reviewing hospital notes, telemetry, EKGs, labs and examining the patient as well as establishing an assessment and plan that was discussed personally with the patient.  > 50% of time was spent in direct patient care.  Length of Stay:  LOS: 0 days   Pixie Casino, MD, [facility], [place] Director of the Advanced Lipid Disorders &  Cardiovascular Risk Reduction Clinic Diplomate of the American Board of Clinical Lipidology Attending Cardiologist  Direct Dial: [phone]  Fax: [phone]  Website:  [email or web address] Kaylen Nghiem 08/10/2018, 10:34 AM

## 2018-08-10 NOTE — Progress Notes (Signed)
Foley catheter placed per order.  Sterile technique used, peri care given.  Inserted by M. Tomasita Crumble and E. Towin, and T. Shekera Beavers.  Patient tolerated well.

## 2018-08-10 NOTE — Social Work (Signed)
Acknowledging consult for pt desire to d/c to SNF; please place PT/OT consults for evaluations needed for managed Medicare plan.   Westley Hummer, MSW, [place] [facility] [phone]

## 2018-08-11 ENCOUNTER — Inpatient Hospital Stay (HOSPITAL_COMMUNITY): Payer: Medicare HMO

## 2018-08-11 DIAGNOSIS — G4733 Obstructive sleep apnea (adult) (pediatric): Secondary | ICD-10-CM

## 2018-08-11 DIAGNOSIS — R079 Chest pain, unspecified: Secondary | ICD-10-CM

## 2018-08-11 LAB — BASIC METABOLIC PANEL
Anion gap: 4 — ABNORMAL LOW (ref 5–15)
BUN: 6 mg/dL (ref 6–20)
CO2: 26 mmol/L (ref 22–32)
Calcium: 12 mg/dL — ABNORMAL HIGH (ref 8.9–10.3)
Chloride: 107 mmol/L (ref 98–111)
Creatinine, Ser: 0.41 mg/dL — ABNORMAL LOW (ref 0.44–1.00)
GFR calc Af Amer: >60 mL/min (ref >60)
GFR calc non Af Amer: >60 mL/min (ref >60)
Glucose, Bld: 114 mg/dL — ABNORMAL HIGH (ref 70–99)
Potassium: 3.8 mmol/L (ref 3.5–5.1)
Sodium: 137 mmol/L (ref 135–145)

## 2018-08-11 LAB — ECHOCARDIOGRAM COMPLETE
Height: 64 in
Weight: 5428.61 oz

## 2018-08-11 LAB — URINE CULTURE: Culture: >=100000 — AB

## 2018-08-11 LAB — TSH: TSH: 1.552 u[IU]/mL (ref 0.350–4.500)

## 2018-08-11 MED ORDER — PERFLUTREN LIPID MICROSPHERE
1.0000 mL | INTRAVENOUS | Status: AC | PRN
  Administered 2018-08-11: 09:00:00 2 mL via INTRAVENOUS
  Filled 2018-08-11: qty 10

## 2018-08-11 NOTE — Progress Notes (Signed)
Echocardiogram 2D Echocardiogram has been performed.  Matilde Bash 08/11/2018, 8:46 AM

## 2018-08-11 NOTE — Progress Notes (Signed)
PROGRESS NOTE   Ellen [name]  OHY:[id number]    DOB: 23-Mar-1960    DOA: 08/09/2018  PCP: Deborah Chalk, FNP   I have briefly reviewed patients previous medical records in [facility].  Brief Narrative:  58 year old female, lives with her fianc and his brother, nonambulatory reportedly since March 2020, PMH of HTN, HLD, GERD, OSA noncompliant with CPAP, morbid obesity, paraplegia and depression presented to ED on 08/09/2018 with atypical chest pain, without acute EKG changes, minimally elevated troponin with flat trend.  Cardiology consulted.  Hospital course complicated by acute urinary retention and complicated acute cystitis.   Assessment & Plan:   Principal Problem:   Chest pain Active Problems:   Hyperlipidemia   Obesity, morbid, BMI 50 or higher (H[facility])   Essential hypertension   GERD   OSA (obstructive sleep apnea)   Paraplegia (H[facility])   Atypical chest pain   Atypical chest pain  Patient has multiple CAD risk factors including HTN, HLD, morbid obesity, family history of reported MI and death in mother in her 70s.  Presented with atypical chest pain, suspicious for musculoskeletal etiology.  EKG shows sinus rhythm with?  Partial LBBB morphology but no acute changes.  CTA chest negative for pulmonary embolism.  Troponin minimally elevated with flat trend, 0.08 > 0.08 >0.07 > 0.07.  Chest pain reproducible to palpation.  Continue aspirin and statins.  Cardiology input appreciated, awaiting TTE which was done this morning and yet to be read.  If echo without abnormal findings then will be evaluated as outpatient.  Low index of suspicion for ACS.  Mild intermittent chest pain which is positional when patient is trying to recline back or associated with chest wall movements.  Essential hypertension  Controlled on enalapril 20 mg daily.  Hyperlipidemia  Continue atorvastatin 20 mg daily.  Morbid obesity/Body mass index is 58.24 kg/m.  OSA noncompliant  with CPAP  Hypercalcemia  Mild and asymptomatic.  Serum calcium 12 today.  Unclear etiology.  Not on calcium supplements but was on high-dose vitamin D supplements which are currently held.  Intact PTH, PTH RP 06/18/2018 were WNL.  SPEP/IFE 06/18/2018: Immunofixation pattern unremarkable.  No monoclonal protein noted.  DC vitamin D supplements until outpatient follow-up with PCP.  Check TSH, 25-hydroxy and 1,25 hydroxy vitamin D levels.  Encourage oral fluid intake.  Follow BMP in a.m.  GERD  Continue PPI.  Chronic paraplegia, s/p T10-T11 decompressive laminectomy 06/19/2018  Bedbound at home, gets PT and OT reportedly  States that she has been working on upper extremity strength which may have contributed to her musculoskeletal chest wall pain.  Continue pregabalin for neuropathy  Added baclofen for discomforting lower extremity spasms.  Spasms have improved.  PT input appreciated, recommend SNF.  Await OT and CSW follow-up.  Acute urinary retention  May be related to acute cystitis complicating paraplegia  Had to undergo in and out catheterization multiple times over the weekend and hence placed indwelling Foley catheter 6/21.   Held Ditropan XL which can also contribute to urinary retention.  Treat UTI as below.  It is been less than 24 hours since Foley catheter was placed, continue for today and attempt discontinuing and voiding trial in a.m.  If fails then will have to discharge with indwelling Foley Catheter and outpatient urology consultation in 1 to 2 weeks for further management.  Acute complicated cystitis/Morganella Morganii  Patient had acute lower UTI symptoms.  Urine microscopy indicated of UTI.  Urine culture shows greater than 100  K of Morganella Morganii sensitive to Ceftriaxone.  UTI complicating underlying neurogenic bladder.  Not sure if she has issues with incomplete bladder emptying chronically.  Day 3 of IV ceftriaxone today.  Improving.    DVT prophylaxis: Lovenox Code Status: Full Family Communication: None at bedside Disposition: DC possibly 6/23 pending clinical improvement, voiding trial, Cardiology clearance, therapies input and SNF determination and availability.  Consultants:  Cardiology  Procedures:  In and out urinary catheterization >indwelling Foley catheter 6/21.  Antimicrobials:  IV ceftriaxone 08/09/2018   Subjective: Mild intermittent midsternal chest pain which is positional/postural and reports having it when she is laying down from propped up to supine position and with other chest wall movements.  Much better compared to admission.  No dyspnea.  Dysuria resolved.  Leg cramps much improved.  States that she has been on vitamin D supplements for a couple of months.  ROS: As above, otherwise negative.  Objective:  Vitals:   08/10/18 0431 08/10/18 1050 08/10/18 2103 08/11/18 0500  BP: 110/70 127/71 124/86 126/83  Pulse: (!) 101 (!) 101 92 96  Resp: 18 (!) 22 17 13   Temp: 98.6 F (37 C)  98.1 F (36.7 C) 98.6 F (37 C)  TempSrc: Oral  Oral Oral  SpO2: 98% 99% 98% 95%  Weight:      Height:        Examination:  General exam: Pleasant young female, moderately built and morbidly obese lying comfortably propped up in bed without distress. Respiratory system: Clear to auscultation.  No increased work of breathing.  Reproducible midsternal chest wall tenderness, less prominent today. Cardiovascular system: S1 & S2 heard, RRR. No JVD, murmurs, rubs, gallops or clicks. No pedal edema.  Telemetry personally reviewed: Sinus rhythm. Gastrointestinal system: Abdomen is nondistended, soft and nontender. No organomegaly or masses felt. Normal bowel sounds heard.  No palpable bladder although difficult to tell given obesity.  Stable. GU: Foley catheter in place draining straw-colored urine. Central nervous system: Alert and oriented. No focal neurological deficits. Extremities: Symmetric 5 x 5 power in upper  extremities and grade 0 x 5 power in lower extremities.  No spasms noted on exam today. Skin: No rashes, lesions or ulcers Psychiatry: Judgement and insight appear normal. Mood & affect appropriate.     Data Reviewed: I have personally reviewed following labs and imaging studies  CBC: Recent Labs  Lab 08/09/18 0028 08/09/18 0731  WBC 8.4 7.0  NEUTROABS 4.2  --   HGB 13.6 12.9  HCT 43.2 40.1  MCV 94.9 94.4  PLT 309 240   Basic Metabolic Panel: Recent Labs  Lab 08/09/18 0028 08/09/18 0731 08/09/18 1301 08/11/18 0430  NA 137  --  140 137  K 3.9  --  4.1 3.8  CL 103  --  107 107  CO2 22  --  23 26  GLUCOSE 112*  --  95 114*  BUN 11  --  9 6  CREATININE 0.56 0.47 0.45 0.41*  CALCIUM 11.8*  --  11.5* 12.0*   Liver Function Tests: Recent Labs  Lab 08/09/18 0028  AST 16  ALT 11  ALKPHOS 96  BILITOT 0.5  PROT 7.2  ALBUMIN 3.4*   Coagulation Profile: No results for input(s): INR, PROTIME in the last 168 hours. Cardiac Enzymes: Recent Labs  Lab 08/09/18 0028 08/09/18 0731 08/09/18 1015 08/09/18 1301  TROPONINI 0.08* 0.08* 0.07* 0.07*   HbA1C: No results for input(s): HGBA1C in the last 72 hours. CBG: No results for input(s): GLUCAP  in the last 168 hours.  Recent Results (from the past 240 hour(s))  Novel Coronavirus,NAA,(SEND-OUT TO REF LAB - TAT 24-48 hrs); Hosp Order     Status: None   Collection Time: 08/09/18  5:48 AM   Specimen: Nasopharyngeal Swab; Respiratory  Result Value Ref Range Status   SARS-CoV-2, NAA NOT DETECTED NOT DETECTED Final    Comment: (NOTE) This test was developed and its performance characteristics determined by [employer]. This test has not been FDA cleared or approved. This test has been authorized by FDA under an Emergency Use Authorization (EUA). This test is only authorized for the duration of time the declaration that circumstances exist justifying the authorization of the emergency use of in vitro diagnostic  tests for detection of SARS-CoV-2 virus and/or diagnosis of COVID-19 infection under section 564(b)(1) of the Act, 21 U.S.C. 482NOI-3(B)(0), unless the authorization is terminated or revoked sooner. When diagnostic testing is negative, the possibility of a false negative result should be considered in the context of a patient's recent exposures and the presence of clinical signs and symptoms consistent with COVID-19. An individual without symptoms of COVID-19 and who is not shedding SARS-CoV-2 virus would expect to have a negative (not detected) result in this assay. Performed  At: [facility] [address] [place], [place] [id number] Rush Farmer MD HW:[id number]    [place]  Final    Comment: Performed at [facility], [place] [address] [place], [place] [address]  Culture, Urine     Status: Abnormal   Collection Time: 08/09/18  3:30 PM   Specimen: Urine, Random  Result Value Ref Range Status   Specimen Description URINE, RANDOM  Final   Special Requests   Final    NONE Performed at [facility], [place] [address] [place], [place] [address]    Culture >=100,000 COLONIES/mL [facility] MORGANII (A)  Final   Report Status 08/11/2018 FINAL  Final   Organism ID, Bacteria MORGANELLA MORGANII (A)  Final      Susceptibility   Morganella morganii - MIC*    AMPICILLIN >=32 RESISTANT Resistant     CEFAZOLIN >=64 RESISTANT Resistant     CEFTRIAXONE <=1 SENSITIVE Sensitive     CIPROFLOXACIN <=0.25 SENSITIVE Sensitive     GENTAMICIN <=1 SENSITIVE Sensitive     IMIPENEM 1 SENSITIVE Sensitive     NITROFURANTOIN RESISTANT Resistant     TRIMETH/SULFA <=20 SENSITIVE Sensitive     AMPICILLIN/SULBACTAM 16 INTERMEDIATE Intermediate     PIP/TAZO <=4 SENSITIVE Sensitive     * >=100,000 COLONIES/mL [facility] MORGANII         Radiology Studies: No results found.      Scheduled Meds: . aspirin  81 mg Oral Daily  . atorvastatin  20 mg Oral  q1800  . docusate sodium  100 mg Oral BID  . enalapril  20 mg Oral Daily  . enoxaparin (LOVENOX) injection  0.5 mg/kg Subcutaneous Q24H  . escitalopram  10 mg Oral Daily  . pantoprazole  40 mg Oral Daily  . pregabalin  75 mg Oral TID  . senna  1 tablet Oral BID  . vitamin B-12  1,000 mcg Oral Daily   Continuous Infusions: . cefTRIAXone (ROCEPHIN)  IV 1 g (08/10/18 1610)     LOS: 1 day     Vernell Leep, MD, FACP, [facility]. [facility]  To contact the attending provider between 7A-7P or the covering provider during after hours 7P-7A, please log into the web site [email or web address]  and access using universal [place] password for that web site. If you do not have the password, please call the hospital operator.  08/11/2018, 9:35 AM

## 2018-08-11 NOTE — Progress Notes (Addendum)
Progress Note  Patient Name: Ellen [name] Date of Encounter: 08/11/2018  Primary Cardiologist: Kirk Ruths, MD   Subjective   No recurrent chest pain. Echo done, pending reading.   Inpatient Medications    Scheduled Meds: . aspirin  81 mg Oral Daily  . atorvastatin  20 mg Oral q1800  . docusate sodium  100 mg Oral BID  . enalapril  20 mg Oral Daily  . enoxaparin (LOVENOX) injection  0.5 mg/kg Subcutaneous Q24H  . escitalopram  10 mg Oral Daily  . pantoprazole  40 mg Oral Daily  . pregabalin  75 mg Oral TID  . senna  1 tablet Oral BID  . vitamin B-12  1,000 mcg Oral Daily   Continuous Infusions: . cefTRIAXone (ROCEPHIN)  IV 1 g (08/10/18 1610)   PRN Meds: acetaminophen, baclofen, diclofenac sodium, ondansetron (ZOFRAN) IV, perflutren lipid microspheres (DEFINITY) IV suspension, polyethylene glycol, traMADol   Vital Signs    Vitals:   08/10/18 0431 08/10/18 1050 08/10/18 2103 08/11/18 0500  BP: 110/70 127/71 124/86 126/83  Pulse: (!) 101 (!) 101 92 96  Resp: 18 (!) 22 17 13   Temp: 98.6 F (37 C)  98.1 F (36.7 C) 98.6 F (37 C)  TempSrc: Oral  Oral Oral  SpO2: 98% 99% 98% 95%  Weight:      Height:        Intake/Output Summary (Last 24 hours) at 08/11/2018 0855 Last data filed at 08/11/2018 0500 Gross per 24 hour  Intake 710 ml  Output 3650 ml  Net -2940 ml   Last 3 Weights 08/09/2018 08/09/2018 06/23/2018  Weight (lbs) 339 lb 4.6 oz 330 lb 365 lb 11.9 oz  Weight (kg) 153.9 kg 149.687 kg 165.9 kg      Telemetry    NSR - Personally Reviewed  ECG    N/A  Physical Exam   GEN: Obese female in no acute distress.   Neck: No JVD Cardiac: RRR, no murmurs, rubs, or gallops.  Respiratory: Clear to auscultation bilaterally. GI: Soft, nontender, non-distended  MS: No edema; No deformity. Neuro:  Nonfocal  Psych: Normal affect   Labs    Chemistry Recent Labs  Lab 08/09/18 0028 08/09/18 0731 08/09/18 1301 08/11/18 0430  NA 137  --  140 137   K 3.9  --  4.1 3.8  CL 103  --  107 107  CO2 22  --  23 26  GLUCOSE 112*  --  95 114*  BUN 11  --  9 6  CREATININE 0.56 0.47 0.45 0.41*  CALCIUM 11.8*  --  11.5* 12.0*  PROT 7.2  --   --   --   ALBUMIN 3.4*  --   --   --   AST 16  --   --   --   ALT 11  --   --   --   ALKPHOS 96  --   --   --   BILITOT 0.5  --   --   --   GFRNONAA >60 >60 >60 >60  GFRAA >60 >60 >60 >60  ANIONGAP 12  --  10 4*     Hematology Recent Labs  Lab 08/09/18 0028 08/09/18 0731  WBC 8.4 7.0  RBC 4.55 4.25  HGB 13.6 12.9  HCT 43.2 40.1  MCV 94.9 94.4  MCH 29.9 30.4  MCHC 31.5 32.2  RDW 11.8 11.9  PLT 309 288    Cardiac Enzymes Recent Labs  Lab 08/09/18 0028 08/09/18 0731  08/09/18 1015 08/09/18 1301  TROPONINI 0.08* 0.08* 0.07* 0.07*    DDimer  Recent Labs  Lab 08/09/18 0028  DDIMER 1.86*     Radiology    No results found.  Cardiac Studies   Echo done, pending reading   Patient Profile     58 y.o. female with HTN, HLD, morbid obesity, OSA on CPAP and GERD, presents with chest pain.  Assessment & Plan    1. Chest pain - Troponin flat trend. Muscle stiffness/spasm improved on baclofen. No PE on CTA of chest. Echo done, pending reading.   2. HTN - BP stable on current medications  3. UTI - On abx   For questions or updates, please contact [place] Please consult [email or web address] for contact info under        Signed, Leanor Kail, PA  08/11/2018, 8:55 AM    Personally seen and examined. Agree with above.   Echocardiogram reviewed, normal ejection fraction, Definity contrast utilized.  Excellent echocardiogram.  No further work-up for chest pain. Mild demand ischemia flat likely secondary to underlying morbid obesity sleep apnea.  No evidence of PE.  Personally reviewed CT scan, there is no evidence of coronary artery calcification.  GEN: obese Well nourished, well developed, in no acute distress  HEENT: normal  Neck: no JVD, carotid bruits, or masses  Cardiac: RRR; no murmurs, rubs, or gallops,no edema  Respiratory:  clear to auscultation bilaterally, normal work of breathing GI: soft, nontender, nondistended, + BS MS: no deformity or atrophy  Skin: warm and dry, no rash Neuro:  Alert and Oriented x 3, paraplegia noted Psych: euthymic mood, full affect  Atypical chest pain Paraplegia Morbid obesity Essential hypertension  -Recommendations are to continue to provide primary prevention, sleep apnea, morbid obesity weight loss, blood pressure control.  Likely musculoskeletal chest pain.  Please let us know if we can be of further assistance. Will sign off.   Candee Furbish, MD

## 2018-08-11 NOTE — Evaluation (Signed)
Occupational Therapy Evaluation Patient Details Name: Ellen [name] MRN: [id number] DOB: [birth year] Today's Date: 08/11/2018    History of Present Illness 58 y.o. female admitted on 08/09/18 for atypical chest pain suspected musculoskeletal etiology, hypercalcemia, acute urinary retention/UTI.  Pt with significant PMH of T10-11 decompressive laminectomy 06/19/18 with parapelgia, obesity.     Clinical Impression   Pt with decline in function and safety with ADLs and ADL mobility with decreased strength, balance and endurance. Pt reports B LE paraplegia. Pt required mod A for bed mobility to sit EOB and sat EOB ~ 10 minutes for grooming and ADL tasks mod - max/total A. Pt needs min guard A  in sitting EOB  working on dynamic sitting balance with simple grooming tasks and simulated bathing tasks. Pt reports that she stays in bed most of the day at home and gets bed baths and that her fiancee assists her with  positioning to prevent bed sores. Pt states that she would like to work with therapy more so that she can use a slide board for transfers. Pt would benefit from acute OT services to address impairments to maximize level of function and safety    Follow Up Recommendations  SNF    Equipment Recommendations  Other (comment)(TBD at next venue of care)    Recommendations for Other Services       Precautions / Restrictions Precautions Precautions: Fall Precaution Comments: parapelegia with decreased trunk control.  Restrictions Weight Bearing Restrictions: No RLE Weight Bearing: Non weight bearing LLE Weight Bearing: Non weight bearing      Mobility Bed Mobility Overal bed mobility: Needs Assistance Bed Mobility: Rolling;Supine to Sit;Sit to Supine Rolling: Mod assist   Supine to sit: Mod assist;HOB elevated Sit to supine: Mod assist   General bed mobility comments: Mod assist to mostly manage her legs and pelvis during bed mobility and transfers, pt is very strong in her UEs and  manages her trunk well with use of bed rails and her arms. OT and RN assisted scooting pt to [place] transfer comment: NT    Balance Overall balance assessment: Needs assistance Sitting-balance support: Feet supported;Bilateral upper extremity supported   Sitting balance - Comments: needs min guard A  in sitting EOB and  sat ~10 mins working on dynamic sitting balance with simple grooming tasks and simulated bathing tasks                                   ADL either performed or assessed with clinical judgement   ADL Overall ADL's : Needs assistance/impaired     Grooming: Wash/dry hands;Wash/dry face;Min guard;Sitting   Upper Body Bathing: Moderate assistance;Sitting Upper Body Bathing Details (indicate cue type and reason): simulated Lower Body Bathing: Maximal assistance   Upper Body Dressing : Moderate assistance   Lower Body Dressing: Total assistance                       Vision Baseline Vision/History: Wears glasses Patient Visual Report: No change from baseline       Perception     Praxis      Pertinent Vitals/Pain Pain Assessment: No/denies pain     Hand Dominance     Extremity/Trunk Assessment Upper Extremity Assessment Upper Extremity Assessment: Generalized weakness   Lower Extremity Assessment Lower Extremity  Assessment: Defer to PT evaluation   Cervical / Trunk Assessment Cervical / Trunk Exceptions: decreased trunk strength as she is heavily reliant on bil UEs to maintain sitting balance EOB.     Communication     Cognition Arousal/Alertness: Awake/alert Behavior During Therapy: WFL for tasks assessed/performed Overall Cognitive Status: No family/caregiver present to determine baseline cognitive functioning                                     General Comments       Exercises     Shoulder Instructions      Home Living Family/patient expects to be discharged  to:: Skilled nursing facility Living Arrangements: Spouse/significant other Available Help at Discharge: Family;Available 24 hours/day;Personal care attendant Type of Home: House       Home Layout: One level     Bathroom Shower/Tub: Tub/shower unit         Home Equipment: Shower seat;Bedside commode;Wheelchair - manual;Hospital bed   Additional Comments: active with HHPT/OT/aide, RN      Prior Functioning/Environment Level of Independence: Needs assistance  Gait / Transfers Assistance Needed: Pt reports she sits EOB with her home PT , uses lift to get to [facility], ADL's / Homemaking Assistance Needed: bathes in the bed, has a [place] aide, total care for cooking/cleaning.    Comments: pt initially reported that she transfers onto a tub bench for showers. Pt eventually reports that she stays in bed majority of the day and gets bad baths and that her fiancee turns her for positioning to prevent bed sores        OT Problem List: Decreased strength;Decreased activity tolerance;Impaired balance (sitting and/or standing);Obesity      OT Treatment/Interventions: Self-care/ADL training;DME and/or AE instruction;Therapeutic activities;Balance training;Therapeutic exercise;Patient/family education    OT Goals(Current goals can be found in the care plan section) Acute Rehab OT Goals Patient Stated Goal: to get more daily rehab OT Goal Formulation: With patient Time For Goal Achievement: 08/25/18 ADL Goals Pt Will Perform Grooming: with supervision;with set-up;sitting Pt Will Perform Upper Body Bathing: with min assist;sitting Pt Will Perform Lower Body Bathing: with mod assist;sitting/lateral leans Pt Will Perform Upper Body Dressing: with min assist;sitting Additional ADL Goal #1: pt will compleet rollinf L and R min A using rails for staff to assist pt with pericare and hygiene  OT Frequency: Min 2X/week   Barriers to D/C: Decreased caregiver support          Co-evaluation               AM-PAC OT "6 Clicks" Daily Activity     Outcome Measure Help from another person eating meals?: None Help from another person taking care of personal grooming?: A Little Help from another person toileting, which includes using toliet, bedpan, or urinal?: Total Help from another person bathing (including washing, rinsing, drying)?: A Lot Help from another person to put on and taking off regular upper body clothing?: A Little Help from another person to put on and taking off regular lower body clothing?: Total 6 Click Score: 14   End of Session    Activity Tolerance: Patient limited by fatigue Patient left: in bed;with call bell/phone within reach  OT Visit Diagnosis: Other abnormalities of gait and mobility (R26.89);Muscle weakness (generalized) (M62.81);Pain;Other (comment)(LE paraplegia)                Time: 6734-1937 OT Time Calculation (min): 26  min Charges:  OT General Charges $OT Visit: 1 Visit OT Evaluation $OT Eval Moderate Complexity: 1 Mod    Britt Bottom 08/11/2018, 12:29 PM

## 2018-08-12 ENCOUNTER — Encounter (HOSPITAL_COMMUNITY): Payer: Self-pay | Admitting: General Practice

## 2018-08-12 LAB — CALCITRIOL (1,25 DI-OH VIT D): Vit D, 1,25-Dihydroxy: 53.9 pg/mL (ref 19.9–79.3)

## 2018-08-12 LAB — VITAMIN D 25 HYDROXY (VIT D DEFICIENCY, FRACTURES): Vit D, 25-Hydroxy: 26.7 ng/mL — ABNORMAL LOW (ref 30.0–100.0)

## 2018-08-12 LAB — BASIC METABOLIC PANEL
Anion gap: 7 (ref 5–15)
BUN: 5 mg/dL — ABNORMAL LOW (ref 6–20)
CO2: 27 mmol/L (ref 22–32)
Calcium: 12.1 mg/dL — ABNORMAL HIGH (ref 8.9–10.3)
Chloride: 105 mmol/L (ref 98–111)
Creatinine, Ser: 0.43 mg/dL — ABNORMAL LOW (ref 0.44–1.00)
GFR calc Af Amer: >60 mL/min (ref >60)
GFR calc non Af Amer: >60 mL/min (ref >60)
Glucose, Bld: 114 mg/dL — ABNORMAL HIGH (ref 70–99)
Potassium: 3.7 mmol/L (ref 3.5–5.1)
Sodium: 139 mmol/L (ref 135–145)

## 2018-08-12 MED ORDER — FREE WATER
200.0000 mL | Freq: Four times a day (QID) | Status: DC
  Administered 2018-08-12 (×2): 200 mL

## 2018-08-12 MED ORDER — FREE WATER
200.0000 mL | Freq: Four times a day (QID) | Status: DC
  Administered 2018-08-12 – 2018-08-13 (×4): 200 mL via ORAL

## 2018-08-12 NOTE — NC FL2 (Signed)
[place] LEVEL OF CARE SCREENING TOOL     IDENTIFICATION  Patient Name: Ellen [name] Birthdate: [birth year] Sex: female Admission Date (Current Location): 08/09/2018  [facility] and [place] Number:  Herbalist and Address:         Provider Number:    Attending Physician Name and Address:  Modena Jansky, MD  Relative Name and Phone Number:  Kathaleen Grinder [phone]    Current Level of Care: Hospital Recommended Level of Care: [place] Prior Approval Number:    Date Approved/Denied:   PASRR Number: [id number]  Discharge Plan: SNF    Current Diagnoses: Patient Active Problem List   Diagnosis Date Noted  . Atypical chest pain 08/10/2018  . Paraplegia ([place]) 08/09/2018  . Chest pain 08/09/2018  . Back pain 06/17/2018  . OSA (obstructive sleep apnea) 06/17/2018  . Hypercalcemia 06/17/2018  . Anxiety 04/06/2017  . Acute respiratory failure with hypoxia ([place])   . Acute hypoxemic respiratory failure ([place]) 04/05/2017  . SIRS (systemic inflammatory response syndrome) ([place]) 04/05/2017  . Elevated troponin 04/05/2017  . Hyponatremia 04/05/2017  . Urgency of micturation 04/14/2015  . Dizziness 04/14/2015  . Neck pain 03/31/2015  . Abnormal uterine bleeding 08/29/2012  . Incarcerated ventral hernia 09/11/2010  . COUGH 05/13/2009  . ALLERGIC RHINITIS, SEASONAL 05/26/2008  . SHOULDER PAIN, LEFT 08/11/2007  . PRURITUS, VAGINAL 09/30/2006  . BACK PAIN 08/05/2006  . 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 LIPOMA 01/08/2006  . Hyperlipidemia 01/08/2006  . Obesity, morbid, BMI 50 or higher ([place]) 01/08/2006  . DEPRESSION 01/08/2006  . Essential hypertension 01/08/2006  . BRONCHITIS NOS 01/08/2006  . GERD 01/08/2006  . HERNIA, VENTRAL 01/08/2006  . ENDOMETRIAL POLYP 01/08/2006  . DEGENERATIVE JOINT DISEASE 01/08/2006    Orientation RESPIRATION BLADDER Height & Weight     Self, Time, Situation, Place  Normal Incontinent  Weight: (!) 339 lb 4.6 oz (153.9 kg) Height:  5\' 4"  (162.6 cm)  BEHAVIORAL SYMPTOMS/MOOD NEUROLOGICAL BOWEL NUTRITION STATUS      Incontinent Diet(please see discharge summary)  AMBULATORY STATUS COMMUNICATION OF NEEDS Skin   Extensive Assist(wheel chair- paraplegia) Verbally Normal                       Personal Care Assistance Level of Assistance  Bathing, Feeding, Dressing Bathing Assistance: Limited assistance Feeding assistance: Independent Dressing Assistance: Limited assistance     Functional Limitations Info  Sight, Hearing, Speech Sight Info: Adequate Hearing Info: Adequate Speech Info: Adequate    SPECIAL CARE FACTORS FREQUENCY  PT (By licensed PT), OT (By licensed OT)     PT Frequency: 3x per week OT Frequency: 3x per week            Contractures Contractures Info: Not present    Additional Factors Info  Code Status, Allergies, Psychotropic Code Status Info: FULL Allergies Info: Codeine Psychotropic Info: escitalopram (LEXAPRO) tablet 10 mg         Current Medications (08/12/2018):  This is the current hospital active medication list Current Facility-Administered Medications  Medication Dose Route Frequency Provider Last Rate Last Dose  . acetaminophen (TYLENOL) tablet 650 mg  650 mg Oral Q4H PRN Elwyn Reach, MD   650 mg at 08/11/18 0209  . aspirin chewable tablet 81 mg  81 mg Oral Daily Elwyn Reach, MD   81 mg at 08/12/18 0848  . atorvastatin (LIPITOR) tablet 20 mg  20 mg Oral q1800 Elwyn Reach, MD  20 mg at 08/11/18 1717  . baclofen (LIORESAL) tablet 5 mg  5 mg Oral TID PRN Modena Jansky, MD   5 mg at 08/11/18 2015  . cefTRIAXone (ROCEPHIN) 1 g in sodium chloride 0.9 % 100 mL IVPB  1 g Intravenous Q24H Hongalgi, Anand D, MD 200 mL/hr at 08/11/18 1723 1 g at 08/11/18 1723  . diclofenac sodium (VOLTAREN) 1 % transdermal gel 2-4 g  2-4 g Topical Q2H PRN Gala Romney L, MD      . docusate sodium (COLACE) capsule 100 mg  100 mg  Oral BID Gala Romney L, MD   100 mg at 08/12/18 0849  . enalapril (VASOTEC) tablet 20 mg  20 mg Oral Daily Elwyn Reach, MD   20 mg at 08/12/18 0848  . enoxaparin (LOVENOX) injection 75 mg  0.5 mg/kg Subcutaneous Q24H Hongalgi, Lenis Dickinson, MD   75 mg at 08/12/18 1017  . escitalopram (LEXAPRO) tablet 10 mg  10 mg Oral Daily Elwyn Reach, MD   10 mg at 08/12/18 0848  . free water 200 mL  200 mL Per Tube Q6H Hongalgi, Anand D, MD      . ondansetron (ZOFRAN) injection 4 mg  4 mg Intravenous Q6H PRN Gala Romney L, MD      . pantoprazole (PROTONIX) EC tablet 40 mg  40 mg Oral Daily Elwyn Reach, MD   40 mg at 08/12/18 0848  . polyethylene glycol (MIRALAX / GLYCOLAX) packet 17 g  17 g Oral Daily PRN Gala Romney L, MD      . pregabalin (LYRICA) capsule 75 mg  75 mg Oral TID Elwyn Reach, MD   75 mg at 08/12/18 0849  . senna (SENOKOT) tablet 8.6 mg  1 tablet Oral BID Gala Romney L, MD   8.6 mg at 08/12/18 0848  . traMADol (ULTRAM) tablet 50 mg  50 mg Oral Q6H PRN Elwyn Reach, MD   50 mg at 08/12/18 0848  . vitamin B-12 (CYANOCOBALAMIN) tablet 1,000 mcg  1,000 mcg Oral Daily Elwyn Reach, MD   1,000 mcg at 08/12/18 6314     Discharge Medications: Please see discharge summary for a list of discharge medications.  Relevant Imaging Results:  Relevant Lab Results:   Additional Information SSN [id number], [place]

## 2018-08-12 NOTE — TOC Initial Note (Addendum)
Transition of Care (TOC) - Initial/Assessment Note   Update: [facility] declined- [facility] has not responded- Patient has accepted bed offer with [facility] waiting on insurance authorization-    Patient Details  Name: Ellen [name] MRN: [id number] Date of Birth: 1960-10-26  Transition of [facility]) CM/SW Contact:    Vinie Sill, [place] Phone Number: 08/12/2018, 1:15 PM  Clinical Narrative:       CSW visited with the patient at bedside. Patient recognizes need for rehab before returning home and is agreeable to a SNF placement. Patient reported preference for [facility] or [facility]. Patient states she lives in a single family home with her significant other. Patient states her goal is to go to rehab at SNF to work on transfers to her wheel chair.        Patient is realistic regarding therapy needs and expressed being hopeful for SNF placement. Patient expressed understanding of CSW role and discharge process as well as medical condition. No questions/concerns about plan or treatment at this time.  Thurmond Butts, MSW, [facility] Clinical Social Worker [phone]   Expected Discharge Plan: Skilled Nursing Facility Barriers to Discharge: SNF Pending bed offer, Insurance Authorization   Patient Goals and CMS Choice Patient states their goals for this hospitalization and ongoing recovery are:: Get rehab so I can get back home. CMS Medicare.gov Compare Post Acute Care list provided to:: Patient Choice offered to / list presented to : Patient  Expected Discharge Plan and Services Expected Discharge Plan: [place] In-house Referral: Clinical Social Work     Living arrangements for the past 2 months: [place]                                      Prior Living Arrangements/Services Living arrangements for the past 2 months: Single Family Home Lives with:: Self, Significant Other Patient language and need for interpreter  reviewed:: No(none needed) Do you feel safe going back to the place where you live?: No(patient wants to go to rehab)      Need for Family Participation in Patient Care: No (Comment) Care giver support system in place?: Yes (comment)   Criminal Activity/Legal Involvement Pertinent to Current Situation/Hospitalization: No - Comment as needed  Activities of Daily Living      Permission Sought/Granted Permission sought to share information with : Facility Sport and exercise psychologist, Tourist information centre manager, Family Supports Permission granted to share information with : Yes, Verbal Permission Granted  Share Information with NAME: Ellen [name]  Permission granted to share info w AGENCY: SNFs  Permission granted to share info w Relationship: significant other  Permission granted to share info w Contact Information: [phone]  Emotional Assessment Appearance:: Appears stated age Attitude/Demeanor/Rapport: Engaged, Ambitious Affect (typically observed): Accepting, Appropriate, Hopeful Orientation: : Oriented to Self, Oriented to Place, Oriented to  Time, Oriented to Situation Alcohol / Substance Use: Not Applicable Psych Involvement: No (comment)  Admission diagnosis:  Chest pain, rule out acute myocardial infarction [R07.9] Patient Active Problem List   Diagnosis Date Noted  . Atypical chest pain 08/10/2018  . Paraplegia ([place]) 08/09/2018  . Chest pain 08/09/2018  . Back pain 06/17/2018  . OSA (obstructive sleep apnea) 06/17/2018  . Hypercalcemia 06/17/2018  . Anxiety 04/06/2017  . Acute respiratory failure with hypoxia ([place])   . Acute hypoxemic respiratory failure ([place]) 04/05/2017  . SIRS (systemic inflammatory response syndrome) ([place]) 04/05/2017  .  Elevated troponin 04/05/2017  . Hyponatremia 04/05/2017  . Urgency of micturation 04/14/2015  . Dizziness 04/14/2015  . Neck pain 03/31/2015  . Abnormal uterine bleeding 08/29/2012  . Incarcerated ventral hernia 09/11/2010  . COUGH  05/13/2009  . ALLERGIC RHINITIS, SEASONAL 05/26/2008  . SHOULDER PAIN, LEFT 08/11/2007  . PRURITUS, VAGINAL 09/30/2006  . BACK PAIN 08/05/2006  . 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 LIPOMA 01/08/2006  . Hyperlipidemia 01/08/2006  . Obesity, morbid, BMI 50 or higher ([place]) 01/08/2006  . DEPRESSION 01/08/2006  . Essential hypertension 01/08/2006  . BRONCHITIS NOS 01/08/2006  . GERD 01/08/2006  . HERNIA, VENTRAL 01/08/2006  . ENDOMETRIAL POLYP 01/08/2006  . DEGENERATIVE JOINT DISEASE 01/08/2006   PCP:  Deborah Chalk, FNP Pharmacy:   [id number] [place], [address] [place] [address] Phone: [phone] Fax: [phone]     Social Determinants of Health (SDOH) Interventions    Readmission Risk Interventions No flowsheet data found.

## 2018-08-12 NOTE — Progress Notes (Signed)
PROGRESS NOTE   Ellen [name]  VOJ:[id number]    DOB: 07-28-1960    DOA: 08/09/2018  PCP: Deborah Chalk, FNP   I have briefly reviewed patients previous medical records in [facility].  Brief Narrative:  58 year old female, lives with her fianc and his brother, nonambulatory reportedly since March 2020, PMH of HTN, HLD, GERD, OSA noncompliant with CPAP, morbid obesity, paraplegia and depression presented to ED on 08/09/2018 with atypical chest pain, without acute EKG changes, minimally elevated troponin with flat trend.  Cardiology consulted.  Hospital course complicated by acute urinary retention and complicated acute cystitis.  Patient medically stable for discharge pending SNF approval and bed availability.  CSW consulted.   Assessment & Plan:   Principal Problem:   Chest pain Active Problems:   Hyperlipidemia   Obesity, morbid, BMI 50 or higher (HCC)   Essential hypertension   GERD   OSA (obstructive sleep apnea)   Paraplegia (HCC)   Atypical chest pain   Atypical chest pain  Patient has multiple CAD risk factors including HTN, HLD, morbid obesity, family history of reported MI and death in mother in her 20s.  Presented with atypical chest pain, suspicious for musculoskeletal etiology.  EKG shows sinus rhythm with?  Partial LBBB morphology but no acute changes.  CTA chest negative for pulmonary embolism.  Troponin minimally elevated with flat trend, 0.08 > 0.08 >0.07 > 0.07.  Chest pain reproducible to palpation.  Continue aspirin and statins.  Low index of suspicion for ACS.  Mild intermittent chest pain which is positional when patient is trying to recline back or associated with chest wall movements.  Cardiology sign off 6/22 appreciated: Echo showed normal EF, no further work-up for chest pain, mild flat trend troponin elevation possibly secondary to morbid obesity and sleep apnea, CT scan showed no evidence of coronary artery calcification and recommend  continue primary prevention, treat sleep apnea, weight loss for morbid obesity and blood pressure control.  Resolved.  Essential hypertension  Controlled on enalapril 20 mg daily.  Hyperlipidemia  Continue atorvastatin 20 mg daily.  Morbid obesity/Body mass index is 58.24 kg/m.  Counseled regarding importance of diet, and weight loss.  OSA noncompliant with CPAP  Counseled regarding importance of compliance with CPAP.  CPAP ordered in the hospital.  Hypercalcemia  Mild and asymptomatic.  Serum calcium 12.1 today.  Unclear etiology.  Not on calcium supplements but was on high-dose vitamin D supplements which are currently held.?  Related to prolonged bedrest or inactivity.  Intact PTH, PTH RP 06/18/2018 were WNL.  SPEP/IFE 06/18/2018: Immunofixation pattern unremarkable.  No monoclonal protein noted.  DC vitamin D supplements until outpatient follow-up with PCP.  TSH normal.  25 hydroxy vitamin D: Low at 26.7.  1,25 dihydroxy vitamin D: Pending.  Encourage oral fluid intake.  Clinically appears euvolemic.  Follow BMP in a.m. and periodically as outpatient.  GERD  Continue PPI.  Chronic paraplegia, s/p T10-T11 decompressive laminectomy 06/19/2018  Bedbound at home, gets PT and OT reportedly  States that she has been working on upper extremity strength which may have contributed to her musculoskeletal chest wall pain.  Continue pregabalin for neuropathy  Added baclofen for discomforting lower extremity spasms.  Spasms have improved.  PT and OT input appreciated, recommend SNF.    Discussed with CSW-insurance approval and SNF bed availability pending which are less likely to come through today.  Acute urinary retention  May be related to acute cystitis complicating paraplegia  Had to undergo in  and out catheterization multiple times over the weekend and hence placed indwelling Foley catheter 6/21.   Held Ditropan XL which can also contribute to urinary  retention.  Treat UTI as below.  Discontinued Foley catheter today 6/23 (was in place for approximately 48 hours).  Monitor for voiding.  If persistent urinary retention, may have to leave indwelling Foley catheter in, outpatient Urology consultation in 1 to 2 weeks for further evaluation and management.  Acute complicated cystitis/Morganella Morganii  Patient had acute lower UTI symptoms.  Urine microscopy indicated of UTI.  Urine culture shows greater than 100 K of Morganella Morganii sensitive to Ceftriaxone.  UTI complicating underlying neurogenic bladder.  Not sure if she has issues with incomplete bladder emptying chronically.  Day 4 of IV ceftriaxone today.  Improving.   Continue IV Ceftriaxone while hospitalized and as discussed with ID MD on call on 6/22, may transition to oral Bactrim at discharge to complete total 7 days treatment course.   DVT prophylaxis: Lovenox Code Status: Full Family Communication: Patient declined MDs offer to call and update family regarding her care.  She stated that she would do it herself. Disposition: DC possibly 6/24 pending SNF insurance approval and bed availability.  Consultants:  Cardiology-signed off.  Procedures:  In and out urinary catheterization >indwelling Foley catheter 6/21-discontinued 6/23.  TTE 08/11/2018: IMPRESSIONS    1. The left ventricle has normal systolic function with an ejection fraction of 60-65%. The cavity size was normal. Left ventricular diastolic Doppler parameters are consistent with impaired relaxation. No evidence of left ventricular regional wall  motion abnormalities.  2. The right ventricle has normal systolic function. The cavity was normal. There is no increase in right ventricular wall thickness.  3. The aortic root is normal in size and structure.   Antimicrobials:  IV ceftriaxone 08/09/2018 >   Subjective: Denies complaints.  No chest pain, lower extremity spasms or dysuria reported.  Foley  catheter removed this morning, yet to void.  As per RN, no acute issues noted.  ROS: As above, otherwise negative.  Objective:  Vitals:   08/12/18 0700 08/12/18 0724 08/12/18 0900 08/12/18 1018  BP:  133/85    Pulse:  (!) 105    Resp: 15 15 13 14   Temp:  98 F (36.7 C)    TempSrc:  Oral    SpO2:  98%    Weight:      Height:        Examination:  General exam: Pleasant young female, moderately built and morbidly obese lying comfortably propped up in bed without distress. Respiratory system: Clear to auscultation.  No increased work of breathing.  No chest wall tenderness. Cardiovascular system: S1 & S2 heard, RRR. No JVD, murmurs, rubs, gallops or clicks. No pedal edema.  Telemetry personally reviewed: Sinus rhythm.  [place] telemetry 6/23. Gastrointestinal system: Abdomen is nondistended, soft and nontender. No organomegaly or masses felt. Normal bowel sounds heard.  No palpable bladder although difficult to tell given obesity.  Stable GU: Foley catheter in place draining straw-colored urine. Central nervous system: Alert and oriented. No focal neurological deficits. Extremities: Symmetric 5 x 5 power in upper extremities and grade 0 x 5 power in lower extremities.  No spasms noted on exam today. Skin: No rashes, lesions or ulcers Psychiatry: Judgement and insight appear normal. Mood & affect appropriate.     Data Reviewed: I have personally reviewed following labs and imaging studies  CBC: Recent Labs  Lab 08/09/18 0028 08/09/18 0731  WBC 8.4  7.0  NEUTROABS 4.2  --   HGB 13.6 12.9  HCT 43.2 40.1  MCV 94.9 94.4  PLT 309 176   Basic Metabolic Panel: Recent Labs  Lab 08/09/18 0028 08/09/18 0731 08/09/18 1301 08/11/18 0430 08/12/18 0449  NA 137  --  140 137 139  K 3.9  --  4.1 3.8 3.7  CL 103  --  107 107 105  CO2 22  --  23 26 27   GLUCOSE 112*  --  95 114* 114*  BUN 11  --  9 6 5*  CREATININE 0.56 0.47 0.45 0.41* 0.43*  CALCIUM 11.8*  --  11.5* 12.0*  12.1*   Liver Function Tests: Recent Labs  Lab 08/09/18 0028  AST 16  ALT 11  ALKPHOS 96  BILITOT 0.5  PROT 7.2  ALBUMIN 3.4*   Coagulation Profile: No results for input(s): INR, PROTIME in the last 168 hours. Cardiac Enzymes: Recent Labs  Lab 08/09/18 0028 08/09/18 0731 08/09/18 1015 08/09/18 1301  TROPONINI 0.08* 0.08* 0.07* 0.07*   HbA1C: No results for input(s): HGBA1C in the last 72 hours. CBG: No results for input(s): GLUCAP in the last 168 hours.  Recent Results (from the past 240 hour(s))  Novel Coronavirus,NAA,(SEND-OUT TO REF LAB - TAT 24-48 hrs); Hosp Order     Status: None   Collection Time: 08/09/18  5:48 AM   Specimen: Nasopharyngeal Swab; Respiratory  Result Value Ref Range Status   SARS-CoV-2, NAA NOT DETECTED NOT DETECTED Final    Comment: (NOTE) This test was developed and its performance characteristics determined by [employer]. This test has not been FDA cleared or approved. This test has been authorized by FDA under an Emergency Use Authorization (EUA). This test is only authorized for the duration of time the declaration that circumstances exist justifying the authorization of the emergency use of in vitro diagnostic tests for detection of SARS-CoV-2 virus and/or diagnosis of COVID-19 infection under section 564(b)(1) of the Act, 21 U.S.C. 160VPX-1(G)(6), unless the authorization is terminated or revoked sooner. When diagnostic testing is negative, the possibility of a false negative result should be considered in the context of a patient's recent exposures and the presence of clinical signs and symptoms consistent with COVID-19. An individual without symptoms of COVID-19 and who is not shedding SARS-CoV-2 virus would expect to have a negative (not detected) result in this assay. Performed  At: [facility] [address] [place], [place] [id number] Rush Farmer MD VO:[id number]    [place]   Final    Comment: Performed at [facility], [place] [address] [place], [place] [address]  Culture, Urine     Status: Abnormal   Collection Time: 08/09/18  3:30 PM   Specimen: Urine, Random  Result Value Ref Range Status   Specimen Description URINE, RANDOM  Final   Special Requests   Final    NONE Performed at [facility], [place] [address] [place], [place] [address]    Culture >=100,000 COLONIES/mL [facility] MORGANII (A)  Final   Report Status 08/11/2018 FINAL  Final   Organism ID, Bacteria MORGANELLA MORGANII (A)  Final      Susceptibility   Morganella morganii - MIC*    AMPICILLIN >=32 RESISTANT Resistant     CEFAZOLIN >=64 RESISTANT Resistant     CEFTRIAXONE <=1 SENSITIVE Sensitive     CIPROFLOXACIN <=0.25 SENSITIVE Sensitive     GENTAMICIN <=1 SENSITIVE Sensitive     IMIPENEM 1 SENSITIVE Sensitive  NITROFURANTOIN RESISTANT Resistant     TRIMETH/SULFA <=20 SENSITIVE Sensitive     AMPICILLIN/SULBACTAM 16 INTERMEDIATE Intermediate     PIP/TAZO <=4 SENSITIVE Sensitive     * >=100,000 COLONIES/mL [facility] MORGANII         Radiology Studies: No results found.      Scheduled Meds: . aspirin  81 mg Oral Daily  . atorvastatin  20 mg Oral q1800  . docusate sodium  100 mg Oral BID  . enalapril  20 mg Oral Daily  . enoxaparin (LOVENOX) injection  0.5 mg/kg Subcutaneous Q24H  . escitalopram  10 mg Oral Daily  . pantoprazole  40 mg Oral Daily  . pregabalin  75 mg Oral TID  . senna  1 tablet Oral BID  . vitamin B-12  1,000 mcg Oral Daily   Continuous Infusions: . cefTRIAXone (ROCEPHIN)  IV 1 g (08/11/18 1723)     LOS: 2 days     Vernell Leep, MD, FACP, [facility]. [facility]  To contact the attending provider between 7A-7P or the covering provider during after hours 7P-7A, please log into the web site [email or web address] and access using universal [place] password for that web site. If you do not have the password, please call the hospital  operator.  08/12/2018, 12:14 PM

## 2018-08-13 LAB — COMPREHENSIVE METABOLIC PANEL
ALT: 13 U/L (ref 0–44)
AST: 17 U/L (ref 15–41)
Albumin: 2.9 g/dL — ABNORMAL LOW (ref 3.5–5.0)
Alkaline Phosphatase: 77 U/L (ref 38–126)
Anion gap: 6 (ref 5–15)
BUN: 7 mg/dL (ref 6–20)
CO2: 28 mmol/L (ref 22–32)
Calcium: 12.1 mg/dL — ABNORMAL HIGH (ref 8.9–10.3)
Chloride: 104 mmol/L (ref 98–111)
Creatinine, Ser: 0.58 mg/dL (ref 0.44–1.00)
GFR calc Af Amer: >60 mL/min (ref >60)
GFR calc non Af Amer: >60 mL/min (ref >60)
Glucose, Bld: 104 mg/dL — ABNORMAL HIGH (ref 70–99)
Potassium: 3.9 mmol/L (ref 3.5–5.1)
Sodium: 138 mmol/L (ref 135–145)
Total Bilirubin: 0.5 mg/dL (ref 0.3–1.2)
Total Protein: 6.2 g/dL — ABNORMAL LOW (ref 6.5–8.1)

## 2018-08-13 NOTE — Progress Notes (Signed)
Physical Therapy Treatment Patient Details Name: Ellen [name] MRN: [id number] DOB: December 19, 1960 Today's Date: 08/13/2018    History of Present Illness 58 y.o. female admitted on 08/09/18 for atypical chest pain suspected musculoskeletal etiology, hypercalcemia, acute urinary retention/UTI.  Pt with significant PMH of T10-11 decompressive laminectomy 06/19/18 with parapelgia, obesity.      PT Comments    Pt was pleasant and agreeable to participate in physical therapy. Pt performed repeated bed mobilities and transitioned into long sitting. Mod A for all activities in bed. Pt may be able to perform AP transfer with +2 assist. Bed placed into chair position at end of session. Patient would benefit from continued skilled PT to maximize functional independence. Will continue to follow acutely.    Follow Up Recommendations  SNF     Equipment Recommendations  None recommended by PT    Recommendations for Other Services       Precautions / Restrictions Precautions Precautions: Fall Precaution Comments: parapelegia with decreased trunk control.  Restrictions Weight Bearing Restrictions: Yes RLE Weight Bearing: Non weight bearing LLE Weight Bearing: Non weight bearing    Mobility  Bed Mobility Overal bed mobility: Needs Assistance Bed Mobility: Rolling;Supine to Sit Rolling: Mod assist   Supine to sit: Mod assist;HOB elevated(long sitting, not EOB) Sit to supine: Min assist   General bed mobility comments: Mod A for LE management to roll L and R. Pt with good use of UEs, cues for technique and use of bed rails. Pt assisted into long sitting and able to maintain with Mod A and bil UEs on bed rails for support.   Transfers                    Ambulation/Gait             General Gait Details: unable   Stairs             Wheelchair Mobility    Modified Rankin (Stroke Patients Only)       Balance Overall balance assessment: Needs  assistance Sitting-balance support: Feet supported;Bilateral upper extremity supported Sitting balance-Leahy Scale: Poor Sitting balance - Comments: assist required for long sitting with bil UE support. Pt able to maintain for ~ 5 min.                                    Cognition Arousal/Alertness: Awake/alert Behavior During Therapy: WFL for tasks assessed/performed Overall Cognitive Status: No family/caregiver present to determine baseline cognitive functioning                                 General Comments: Pt at times having to pause to think of what she wants to say (almost retrieval problems)      Exercises      General Comments General comments (skin integrity, edema, etc.): Pt able to use bil UEs to scoot up in bed when laid flat. min A needed for positioning.      Pertinent Vitals/Pain Pain Assessment: Faces Faces Pain Scale: Hurts little more Pain Location: Spasms in back with mobility and pain in BIL LEs Pain Descriptors / Indicators: Spasm Pain Intervention(s): Limited activity within patient's tolerance;Monitored during session;Repositioned    Home Living                      Prior Function  PT Goals (current goals can now be found in the care plan section) Acute Rehab PT Goals Patient Stated Goal: to get more daily rehab and walk again PT Goal Formulation: With patient Time For Goal Achievement: 08/24/18 Potential to Achieve Goals: Good Progress towards PT goals: Progressing toward goals    Frequency    Min 2X/week      PT Plan Current plan remains appropriate    Co-evaluation              AM-PAC PT "6 Clicks" Mobility   Outcome Measure  Help needed turning from your back to your side while in a flat bed without using bedrails?: A Lot Help needed moving from lying on your back to sitting on the side of a flat bed without using bedrails?: A Lot Help needed moving to and from a bed to a  chair (including a wheelchair)?: Total Help needed standing up from a chair using your arms (e.g., wheelchair or bedside chair)?: Total Help needed to walk in hospital room?: Total Help needed climbing 3-5 steps with a railing? : Total 6 Click Score: 8    End of Session   Activity Tolerance: Patient tolerated treatment well;Patient limited by fatigue Patient left: in bed;with call bell/phone within reach(bed in chair position.)   PT Visit Diagnosis: Muscle weakness (generalized) (M62.81);Difficulty in walking, not elsewhere classified (R26.2);Other symptoms and signs involving the nervous system (R29.898)     Time: 3790-2409 PT Time Calculation (min) (ACUTE ONLY): 18 min  Charges:  $Therapeutic Activity: 8-22 mins                     Benjiman Core, [place] Pager [phone] Acute Rehab    Allena Katz 08/13/2018, 1:11 PM

## 2018-08-13 NOTE — Progress Notes (Signed)
Per social work, pt previous negative COVID 19 results sufficient for facility she will be discharged to.

## 2018-08-13 NOTE — TOC Progression Note (Signed)
Transition of Care [facility]) - Progression Note    Patient Details  Name: Ellen [name] MRN: [id number] Date of Birth: 04-18-1960  [facility] of [facility] CM/SW [place], [place] Phone Number: 08/13/2018, 4:40 PM  Clinical Narrative:     Patient's insurance remains pending- CSW faxed updated PT note are requested.   Thurmond Butts, MSW, [facility] Clinical Social Worker [phone]   Expected Discharge Plan: Skilled Nursing Facility Barriers to Discharge: SNF Pending bed offer, Insurance Authorization  Expected Discharge Plan and Services Expected Discharge Plan: [place] In-house Referral: Clinical Social Work     Living arrangements for the past 2 months: Single Family Home                                       Social Determinants of Health (SDOH) Interventions    Readmission Risk Interventions No flowsheet data found.

## 2018-08-13 NOTE — Progress Notes (Signed)
Pt very uncomfortable, urge to have BM but noticeably impacted when RN/tech cleaning/turning her.  Digitally disimpacted. Miralax administered.

## 2018-08-13 NOTE — Progress Notes (Signed)
PROGRESS NOTE                                                                                                                                                                                                             Patient Demographics:    Ellen [name], is a 58 y.o. female, DOB - 04/08/1960, QPR:[id number]  Admit date - 08/09/2018   Admitting Physician Elwyn Reach, MD  Outpatient Primary MD for the patient is Deborah Chalk, FNP  LOS - 3  Outpatient Specialists:  Chief Complaint  Patient presents with   Chest Pain   Back Pain       Brief Narrative   58 year old female with functional paraplegia since March of this year, hypertension, OSA noncompliant with CPAP, morbid obesity, depression, hyperlipidemia, GERD presented to the ED with atypical chest pain with minimally elevated troponin.  Hospital course complicated by acute urinary retention and acute cystitis.    Subjective:   Denies any symptoms.   Assessment  & Plan :    Principal Problem: Atypical chest pain EKG unremarkable.  CT angiogram of the chest negative for PE.  Troponin flat.  Reproducible pain on palpation.  Continue aspirin and statin.  Echo showed normal EF.  Cardiology signed off and no intervention needed at this time.  Denies any further chest discomfort.  Active problems  Acute urinary retention and acute cystitis Ditropan discontinued which could be contributing to her urinary retention.  Required Foley upon admission.  Now discontinued and voiding without difficulty.  Completed treatment with antibiotics for UTI after today (day 5)  Essential hypertension Stable.  Hyperlipidemia Continue statin  Morbid obesity with BMI of 38.24 kg/m Counseled on weight loss and exercise.  OSA with noncompliance with CPAP Counseled.  Continue CPAP while in the hospital  Hypercalcemia Asymptomatic.  Vitamin D supplements  discontinued. Intact PTH/PTH related peptide, SPEP/IFE previously negative.  Chronic paraplegia with history of T10-T11 decompressive laminectomy Bedbound at home with home health PT.  Seen by PT and recommends SNF.  Pending insurance authorization.  Code Status : Full code  Family Communication  : None  Disposition Plan  : Pending SNF (has bed but waiting authorization  Barriers For Discharge : Awaiting authorization  Consults  : Cardiology  Procedures  : 2D echo  DVT Prophylaxis  :  Lovenox   Lab  Results  Component Value Date   PLT 288 08/09/2018    Antibiotics  :    Anti-infectives (From admission, onward)   Start     Dose/Rate Route Frequency Ordered Stop   08/09/18 1600  cefTRIAXone (ROCEPHIN) 1 g in sodium chloride 0.9 % 100 mL IVPB     1 g 200 mL/hr over 30 Minutes Intravenous Every 24 hours 08/09/18 1510          Objective:   Vitals:   08/13/18 0012 08/13/18 0632 08/13/18 0634 08/13/18 0900  BP:   128/73 127/82  Pulse: 90 92  87  Resp: 16   19  Temp:  98.9 F (37.2 C)  98.3 F (36.8 C)  TempSrc:  Oral  Oral  SpO2: 96% 96% 100% 98%  Weight:      Height:        Wt Readings from Last 3 Encounters:  08/09/18 (!) 153.9 kg  06/23/18 (!) 165.9 kg  05/30/18 (!) 158.8 kg     Intake/Output Summary (Last 24 hours) at 08/13/2018 1517 Last data filed at 08/13/2018 0318 Gross per 24 hour  Intake 600 ml  Output 1400 ml  Net -800 ml     Physical Exam  Gen: not in distress HEENT: moist mucosa, supple neck Chest: clear b/l, no added sounds CVS: N S1&S2, no murmurs, GI: soft, NT, ND,  Musculoskeletal: warm, no edema     Data Review:    CBC Recent Labs  Lab 08/09/18 0028 08/09/18 0731  WBC 8.4 7.0  HGB 13.6 12.9  HCT 43.2 40.1  PLT 309 288  MCV 94.9 94.4  MCH 29.9 30.4  MCHC 31.5 32.2  RDW 11.8 11.9  LYMPHSABS 3.1  --   MONOABS 0.8  --   EOSABS 0.2  --   BASOSABS 0.0  --     Chemistries  Recent Labs  Lab 08/09/18 0028  08/09/18 0731 08/09/18 1301 08/11/18 0430 08/12/18 0449 08/13/18 0434  NA 137  --  140 137 139 138  K 3.9  --  4.1 3.8 3.7 3.9  CL 103  --  107 107 105 104  CO2 22  --  23 26 27 28   GLUCOSE 112*  --  95 114* 114* 104*  BUN 11  --  9 6 5* 7  CREATININE 0.56 0.47 0.45 0.41* 0.43* 0.58  CALCIUM 11.8*  --  11.5* 12.0* 12.1* 12.1*  AST 16  --   --   --   --  17  ALT 11  --   --   --   --  13  ALKPHOS 96  --   --   --   --  77  BILITOT 0.5  --   --   --   --  0.5   ------------------------------------------------------------------------------------------------------------------ No results for input(s): CHOL, HDL, LDLCALC, TRIG, CHOLHDL, LDLDIRECT in the last 72 hours.  Lab Results  Component Value Date   HGBA1C 5.1 06/20/2018   ------------------------------------------------------------------------------------------------------------------ Recent Labs    08/11/18 0919  TSH 1.552   ------------------------------------------------------------------------------------------------------------------ No results for input(s): VITAMINB12, FOLATE, FERRITIN, TIBC, IRON, RETICCTPCT in the last 72 hours.  Coagulation profile No results for input(s): INR, PROTIME in the last 168 hours.  No results for input(s): DDIMER in the last 72 hours.  Cardiac Enzymes Recent Labs  Lab 08/09/18 0731 08/09/18 1015 08/09/18 1301  TROPONINI 0.08* 0.07* 0.07*   ------------------------------------------------------------------------------------------------------------------ No results found for: BNP  Inpatient Medications  Scheduled Meds:  aspirin  81  mg Oral Daily   atorvastatin  20 mg Oral q1800   docusate sodium  100 mg Oral BID   enalapril  20 mg Oral Daily   enoxaparin (LOVENOX) injection  0.5 mg/kg Subcutaneous Q24H   escitalopram  10 mg Oral Daily   free water  200 mL Oral Q6H   pantoprazole  40 mg Oral Daily   pregabalin  75 mg Oral TID   senna  1 tablet Oral BID    vitamin B-12  1,000 mcg Oral Daily   Continuous Infusions:  cefTRIAXone (ROCEPHIN)  IV 1 g (08/12/18 1716)   PRN Meds:.acetaminophen, baclofen, diclofenac sodium, ondansetron (ZOFRAN) IV, polyethylene glycol, traMADol  Micro Results Recent Results (from the past 240 hour(s))  Novel Coronavirus,NAA,(SEND-OUT TO REF LAB - TAT 24-48 hrs); Hosp Order     Status: None   Collection Time: 08/09/18  5:48 AM   Specimen: Nasopharyngeal Swab; Respiratory  Result Value Ref Range Status   SARS-CoV-2, NAA NOT DETECTED NOT DETECTED Final    Comment: (NOTE) This test was developed and its performance characteristics determined by [employer]. This test has not been FDA cleared or approved. This test has been authorized by FDA under an Emergency Use Authorization (EUA). This test is only authorized for the duration of time the declaration that circumstances exist justifying the authorization of the emergency use of in vitro diagnostic tests for detection of SARS-CoV-2 virus and/or diagnosis of COVID-19 infection under section 564(b)(1) of the Act, 21 U.S.C. 355HRC-1(U)(3), unless the authorization is terminated or revoked sooner. When diagnostic testing is negative, the possibility of a false negative result should be considered in the context of a patient's recent exposures and the presence of clinical signs and symptoms consistent with COVID-19. An individual without symptoms of COVID-19 and who is not shedding SARS-CoV-2 virus would expect to have a negative (not detected) result in this assay. Performed  At: [facility] [address] [place], [place] [id number] Rush Farmer MD HO:[id number]    [place]  Final    Comment: Performed at [facility], [place] [address], [place], [place] [address]  Culture, Urine     Status: Abnormal   Collection Time: 08/09/18  3:30 PM   Specimen: Urine, Random  Result Value Ref Range Status   Specimen  Description URINE, RANDOM  Final   Special Requests   Final    NONE Performed at [facility], [place] [address] [place], [place] [address]    Culture >=100,000 COLONIES/mL [facility] MORGANII (A)  Final   Report Status 08/11/2018 FINAL  Final   Organism ID, Bacteria MORGANELLA MORGANII (A)  Final      Susceptibility   Morganella morganii - MIC*    AMPICILLIN >=32 RESISTANT Resistant     CEFAZOLIN >=64 RESISTANT Resistant     CEFTRIAXONE <=1 SENSITIVE Sensitive     CIPROFLOXACIN <=0.25 SENSITIVE Sensitive     GENTAMICIN <=1 SENSITIVE Sensitive     IMIPENEM 1 SENSITIVE Sensitive     NITROFURANTOIN RESISTANT Resistant     TRIMETH/SULFA <=20 SENSITIVE Sensitive     AMPICILLIN/SULBACTAM 16 INTERMEDIATE Intermediate     PIP/TAZO <=4 SENSITIVE Sensitive     * >=100,000 COLONIES/mL [facility] MORGANII    Radiology Reports Ct Angio Chest Pe W And/or Wo Contrast  Result Date: 08/09/2018 CLINICAL DATA:  58 y/o F; nonradiating chest pain. PE suspected, intermediate prob, positive D-dimer. EXAM: CT ANGIOGRAPHY CHEST WITH CONTRAST TECHNIQUE: Multidetector CT imaging of the chest was  performed using the standard protocol during bolus administration of intravenous contrast. Multiplanar CT image reconstructions and MIPs were obtained to evaluate the vascular anatomy. CONTRAST:  169mL OMNIPAQUE IOHEXOL 300 MG/ML  SOLN COMPARISON:  05/30/2018 CTA of the chest. FINDINGS: Cardiovascular: Mild respiratory motion artifact. Satisfactory opacification of the pulmonary arteries to the segmental level. No pulmonary embolus identified. Normal heart size. No pericardial effusion. Mediastinum/Nodes: No enlarged mediastinal, hilar, or axillary lymph nodes. Thyroid gland, trachea, and esophagus demonstrate no significant findings. Lungs/Pleura: Mosaic attenuation of the lungs. No consolidation, effusion, or pneumothorax. Upper Abdomen: No acute abnormality. Musculoskeletal: Multilevel discogenic degenerative changes  of the thoracic spine. No acute osseous abnormality is evident. Review of the MIP images confirms the above findings. IMPRESSION: 1. Mild respiratory motion artifact. No pulmonary embolus identified. 2. Mosaic attenuation of the lungs probably represents small airways disease and air trapping. Electronically Signed   By: Kristine Garbe M.D.   On: 08/09/2018 04:40   Dg Chest Port 1 View  Result Date: 08/09/2018 CLINICAL DATA:  Chest pain. EXAM: PORTABLE CHEST 1 VIEW COMPARISON:  06/24/2018 FINDINGS: The cardiomediastinal contours are unchanged. The lungs are clear. Pulmonary vasculature is normal. No consolidation, pleural effusion, or pneumothorax. No acute osseous abnormalities are seen. IMPRESSION: No acute chest findings. Electronically Signed   By: Keith Rake M.D.   On: 08/09/2018 00:57    Time Spent in minutes  25   Kaisley Stiverson M.D on 08/13/2018 at 3:17 PM  Between 7am to 7pm - Pager - [phone]  After 7pm go to [email or web address] - password [facility]  [facility] -  Office  [phone]

## 2018-08-14 DIAGNOSIS — R338 Other retention of urine: Secondary | ICD-10-CM | POA: Diagnosis present

## 2018-08-14 DIAGNOSIS — N390 Urinary tract infection, site not specified: Secondary | ICD-10-CM | POA: Diagnosis present

## 2018-08-14 MED ORDER — POLYETHYLENE GLYCOL 3350 17 G PO PACK
17.0000 g | PACK | Freq: Every day | ORAL | Status: DC
  Administered 2018-08-14: 10:00:00 17 g via ORAL
  Filled 2018-08-14: qty 1

## 2018-08-14 MED ORDER — VITAMIN B-12 1000 MCG PO TABS: 1000.0000 ug | ORAL_TABLET | Freq: Every day | ORAL | 0 refills | Status: AC

## 2018-08-14 NOTE — Care Management Important Message (Signed)
Important Message  Patient Details  Name: KEMYAH BUSER MRN: [id number] Date of Birth: 03/07/1960   Medicare Important Message Given:  Yes     Shelda Altes 08/14/2018, 1:26 PM

## 2018-08-14 NOTE — Discharge Summary (Signed)
Physician Discharge Summary  Ellen [name] IRS:[id number] DOB: [birth year] DOA: 08/09/2018  PCP: Deborah Chalk, FNP  Admit date: 08/09/2018 Discharge date: 08/14/2018  Admitted From: Home Disposition: Home  Recommendations for Outpatient Follow-up:  1. Follow up with PCP in 1-2 weeks   Home Health: PT/OT/RN/home health aide/social work Equipment/Devices: As per home health  Discharge Condition fair CODE STATUS: Full code Diet recommendation: Heart healthy    Discharge Diagnoses:  Principal Problem: Atypical chest pain  Active Problems:   Hyperlipidemia   Obesity, morbid, BMI 50 or higher (HCC)   Essential hypertension   GERD   OSA (obstructive sleep apnea)   Paraplegia (HCC)   Atypical chest pain   Acute urinary retention   Acute lower UTI  Brief narrative/HPI Please refer to admission H&P for details, in brief,58 year old female with functional paraplegia since March of this year, hypertension, OSA noncompliant with CPAP, morbid obesity, depression, hyperlipidemia, GERD presented to the ED with atypical chest pain with minimally elevated troponin.  Hospital course complicated by acute urinary retention and acute cystitis.  COVID-19 was tested negative.  Hospital course   Principal Problem: Atypical chest pain EKG unremarkable.  CT angiogram of the chest negative for PE.  Troponin flat.  Reproducible pain on palpation.  Continue aspirin and statin.  Echo showed normal EF.  Cardiology signed off and no intervention needed at this time and have arranged outpatient appointment.  No further chest pain symptoms.  Active problems  Acute urinary retention and acute cystitis Ditropan discontinued which could be contributing to her urinary retention.  Required Foley upon admission.  Now discontinued and voiding without difficulty.  Completed treatment with antibiotics for Morganella UTI of 5 days duration.  Essential  hypertension Stable.  Hyperlipidemia Continue statin  Morbid obesity with BMI of 38.24 kg/m Counseled on weight loss and exercise.  OSA with noncompliance with CPAP Counseled on adherence.  Continue CPAP while in the hospital  Hypercalcemia Asymptomatic.  Vitamin D supplements discontinued. Intact PTH/PTH related peptide, SPEP/IFE previously negative.  Chronic paraplegia with history of T10-T11 decompressive laminectomy Bedbound at home with home health PT.  Seen by PT and recommends SNF.    Was declined by insurance as patient's physical condition has not changed since she was discharged from SNF about 3 weeks back.  Ellene Route reported that he can provide 24-hour supervision.  She will be discharged home with home health RN, PT, OT, social work.  Chronic constipation Resume stool softeners  Family Communication  : None  Disposition Plan  :  Home with home health and 24-hour supervision  Barriers For Discharge : Awaiting authorization  Consults  : Cardiology  Procedures  : 2D echo  Patient clinically stable to be discharged home with outpatient follow-up. Discharge Instructions   Allergies as of 08/14/2018      Reactions   Codeine Itching   Only happened one time and did not occur last time patient was in the hospital. (??)      Medication List    STOP taking these medications   oxybutynin 10 MG 24 hr tablet Commonly known as: DITROPAN-XL   oxyCODONE 5 MG immediate release tablet Commonly known as: Oxy IR/ROXICODONE     TAKE these medications   albuterol 108 (90 Base) MCG/ACT inhaler Commonly known as: VENTOLIN HFA Inhale 2 puffs into the lungs every 4 (four) hours as needed for wheezing or shortness of breath.   amLODipine 5 MG tablet Commonly known as: NORVASC Take 5 mg by mouth daily.  atorvastatin 20 MG tablet Commonly known as: LIPITOR Take 20 mg by mouth daily at 6 PM.   BL Adult Aspirin Low Strength 81 MG chewable tablet Generic drug:  aspirin Chew 81 mg by mouth daily.   diclofenac sodium 1 % Gel Commonly known as: VOLTAREN Apply 2-4 g topically every 2 (two) hours as needed (for bilateral knee pain).   docusate sodium 100 MG capsule Commonly known as: COLACE Take 1 capsule (100 mg total) by mouth 2 (two) times daily.   enalapril 20 MG tablet Commonly known as: VASOTEC Take 40 mg by mouth daily.   escitalopram 10 MG tablet Commonly known as: LEXAPRO Take 10 mg by mouth daily.   ferrous sulfate 325 (65 FE) MG tablet Take 325 mg by mouth daily with breakfast.   ipratropium 0.02 % nebulizer solution Commonly known as: ATROVENT Take 2.5 mLs (0.5 mg total) by nebulization every 6 (six) hours as needed for wheezing or shortness of breath.   levalbuterol 0.63 MG/3ML nebulizer solution Commonly known as: XOPENEX Take 3 mLs (0.63 mg total) by nebulization every 6 (six) hours as needed for wheezing or shortness of breath.   medroxyPROGESTERone 5 MG tablet Commonly known as: PROVERA Take 5 mg by mouth daily.   omeprazole 20 MG capsule Commonly known as: PRILOSEC Take 20 mg by mouth daily before breakfast.   polyethylene glycol powder 17 GM/SCOOP powder Commonly known as: GLYCOLAX/MIRALAX Take 17 g by mouth daily as needed for mild constipation.   pregabalin 75 MG capsule Commonly known as: LYRICA Take 100 mg by mouth 3 (three) times daily.   senna 8.6 MG Tabs tablet Commonly known as: SENOKOT Take 1 tablet (8.6 mg total) by mouth 2 (two) times daily.   traMADol 50 MG tablet Commonly known as: ULTRAM Take 50 mg by mouth every 6 (six) hours as needed (for pain).   vitamin B-12 1000 MCG tablet Commonly known as: CYANOCOBALAMIN Take 1 tablet (1,000 mcg total) by mouth daily.      Follow-up Information    Deborah Chalk, FNP.   Specialty: Nurse Practitioner Contact information: [address] [place] [address] [phone]        Lelon Perla, MD. Go on 09/19/2018.    Specialty: Cardiology Why: @9 :40am for hospital follow up Contact information: [address] [place] [address] [phone]          Allergies  Allergen Reactions  . Codeine Itching    Only happened one time and did not occur last time patient was in the hospital. (??)        Procedures/Studies: Ct Angio Chest Pe W And/or Wo Contrast  Result Date: 08/09/2018 CLINICAL DATA:  58 y/o F; nonradiating chest pain. PE suspected, intermediate prob, positive D-dimer. EXAM: CT ANGIOGRAPHY CHEST WITH CONTRAST TECHNIQUE: Multidetector CT imaging of the chest was performed using the standard protocol during bolus administration of intravenous contrast. Multiplanar CT image reconstructions and MIPs were obtained to evaluate the vascular anatomy. CONTRAST:  12mL OMNIPAQUE IOHEXOL 300 MG/ML  SOLN COMPARISON:  05/30/2018 CTA of the chest. FINDINGS: Cardiovascular: Mild respiratory motion artifact. Satisfactory opacification of the pulmonary arteries to the segmental level. No pulmonary embolus identified. Normal heart size. No pericardial effusion. Mediastinum/Nodes: No enlarged mediastinal, hilar, or axillary lymph nodes. Thyroid gland, trachea, and esophagus demonstrate no significant findings. Lungs/Pleura: Mosaic attenuation of the lungs. No consolidation, effusion, or pneumothorax. Upper Abdomen: No acute abnormality. Musculoskeletal: Multilevel discogenic degenerative changes of the thoracic spine.  No acute osseous abnormality is evident. Review of the MIP images confirms the above findings. IMPRESSION: 1. Mild respiratory motion artifact. No pulmonary embolus identified. 2. Mosaic attenuation of the lungs probably represents small airways disease and air trapping. Electronically Signed   By: Kristine Garbe M.D.   On: 08/09/2018 04:40   Dg Chest Port 1 View  Result Date: 08/09/2018 CLINICAL DATA:  Chest pain. EXAM: PORTABLE CHEST 1 VIEW COMPARISON:  06/24/2018 FINDINGS:  The cardiomediastinal contours are unchanged. The lungs are clear. Pulmonary vasculature is normal. No consolidation, pleural effusion, or pneumothorax. No acute osseous abnormalities are seen. IMPRESSION: No acute chest findings. Electronically Signed   By: Keith Rake M.D.   On: 08/09/2018 00:57       Subjective: Denies any pain.  Noted to be constipated last evening and stool manually disimpacted.  Discharge Exam: Vitals:   08/14/18 0104 08/14/18 0521  BP:  118/75  Pulse:  97  Resp:  20  Temp:  98.3 F (36.8 C)  SpO2: 96% 98%   Vitals:   08/13/18 0900 08/13/18 1924 08/14/18 0104 08/14/18 0521  BP: 127/82 117/67  118/75  Pulse: 87 72  97  Resp: 19 18  20   Temp: 98.3 F (36.8 C) 98.1 F (36.7 C)  98.3 F (36.8 C)  TempSrc: Oral Oral  Oral  SpO2: 98% 96% 96% 98%  Weight:      Height:        General: Middle-aged obese female not in distress HEENT: Moist mucosa, supple neck Chest: Clear CVs: Normal S1-S2, no murmurs GI: Soft, nondistended nontender Musculoskeletal: Warm, no edema    The results of significant diagnostics from this hospitalization (including imaging, microbiology, ancillary and laboratory) are listed below for reference.     Microbiology: Recent Results (from the past 240 hour(s))  Novel Coronavirus,NAA,(SEND-OUT TO REF LAB - TAT 24-48 hrs); Hosp Order     Status: None   Collection Time: 08/09/18  5:48 AM   Specimen: Nasopharyngeal Swab; Respiratory  Result Value Ref Range Status   SARS-CoV-2, NAA NOT DETECTED NOT DETECTED Final    Comment: (NOTE) This test was developed and its performance characteristics determined by [employer]. This test has not been FDA cleared or approved. This test has been authorized by FDA under an Emergency Use Authorization (EUA). This test is only authorized for the duration of time the declaration that circumstances exist justifying the authorization of the emergency use of in vitro diagnostic tests  for detection of SARS-CoV-2 virus and/or diagnosis of COVID-19 infection under section 564(b)(1) of the Act, 21 U.S.C. 277AJO-8(N)(8), unless the authorization is terminated or revoked sooner. When diagnostic testing is negative, the possibility of a false negative result should be considered in the context of a patient's recent exposures and the presence of clinical signs and symptoms consistent with COVID-19. An individual without symptoms of COVID-19 and who is not shedding SARS-CoV-2 virus would expect to have a negative (not detected) result in this assay. Performed  At: [facility] [address] [place], [place] [id number] Rush Farmer MD SJ:[id number]    [place]  Final    Comment: Performed at [facility], [place] [address] [place], [place] [address]  Culture, Urine     Status: Abnormal   Collection Time: 08/09/18  3:30 PM   Specimen: Urine, Random  Result Value Ref Range Status   Specimen Description URINE, RANDOM  Final   Special Requests   Final    NONE Performed  at [facility], [place] [address] [place], [place] [address]    Culture >=100,000 COLONIES/mL [facility] MORGANII (A)  Final   Report Status 08/11/2018 FINAL  Final   Organism ID, Bacteria MORGANELLA MORGANII (A)  Final      Susceptibility   Morganella morganii - MIC*    AMPICILLIN >=32 RESISTANT Resistant     CEFAZOLIN >=64 RESISTANT Resistant     CEFTRIAXONE <=1 SENSITIVE Sensitive     CIPROFLOXACIN <=0.25 SENSITIVE Sensitive     GENTAMICIN <=1 SENSITIVE Sensitive     IMIPENEM 1 SENSITIVE Sensitive     NITROFURANTOIN RESISTANT Resistant     TRIMETH/SULFA <=20 SENSITIVE Sensitive     AMPICILLIN/SULBACTAM 16 INTERMEDIATE Intermediate     PIP/TAZO <=4 SENSITIVE Sensitive     * >=100,000 COLONIES/mL MORGANELLA MORGANII     Labs: BNP (last 3 results) No results for input(s): BNP in the last 8760 hours. Basic Metabolic Panel: Recent Labs  Lab 08/09/18 0028  08/09/18 0731 08/09/18 1301 08/11/18 0430 08/12/18 0449 08/13/18 0434  NA 137  --  140 137 139 138  K 3.9  --  4.1 3.8 3.7 3.9  CL 103  --  107 107 105 104  CO2 22  --  23 26 27 28   GLUCOSE 112*  --  95 114* 114* 104*  BUN 11  --  9 6 5* 7  CREATININE 0.56 0.47 0.45 0.41* 0.43* 0.58  CALCIUM 11.8*  --  11.5* 12.0* 12.1* 12.1*   Liver Function Tests: Recent Labs  Lab 08/09/18 0028 08/13/18 0434  AST 16 17  ALT 11 13  ALKPHOS 96 77  BILITOT 0.5 0.5  PROT 7.2 6.2*  ALBUMIN 3.4* 2.9*   No results for input(s): LIPASE, AMYLASE in the last 168 hours. No results for input(s): AMMONIA in the last 168 hours. CBC: Recent Labs  Lab 08/09/18 0028 08/09/18 0731  WBC 8.4 7.0  NEUTROABS 4.2  --   HGB 13.6 12.9  HCT 43.2 40.1  MCV 94.9 94.4  PLT 309 288   Cardiac Enzymes: Recent Labs  Lab 08/09/18 0028 08/09/18 0731 08/09/18 1015 08/09/18 1301  TROPONINI 0.08* 0.08* 0.07* 0.07*   BNP: Invalid input(s): POCBNP CBG: No results for input(s): GLUCAP in the last 168 hours. D-Dimer No results for input(s): DDIMER in the last 72 hours. Hgb A1c No results for input(s): HGBA1C in the last 72 hours. Lipid Profile No results for input(s): CHOL, HDL, LDLCALC, TRIG, CHOLHDL, LDLDIRECT in the last 72 hours. Thyroid function studies No results for input(s): TSH, T4TOTAL, T3FREE, THYROIDAB in the last 72 hours.  Invalid input(s): FREET3 Anemia work up No results for input(s): VITAMINB12, FOLATE, FERRITIN, TIBC, IRON, RETICCTPCT in the last 72 hours. Urinalysis    Component Value Date/Time   COLORURINE YELLOW 08/09/2018 1112   APPEARANCEUR CLOUDY (A) 08/09/2018 1112   LABSPEC 1.041 (H) 08/09/2018 1112   PHURINE 6.0 08/09/2018 1112   GLUCOSEU NEGATIVE 08/09/2018 1112   GLUCOSEU NEG mg/dL 08/05/2006 2022   HGBUR SMALL (A) 08/09/2018 1112   BILIRUBINUR NEGATIVE 08/09/2018 1112   KETONESUR NEGATIVE 08/09/2018 1112   PROTEINUR 30 (A) 08/09/2018 1112   UROBILINOGEN 1.0  06/14/2011 1828   NITRITE NEGATIVE 08/09/2018 1112   LEUKOCYTESUR LARGE (A) 08/09/2018 1112   Sepsis Labs Invalid input(s): PROCALCITONIN,  WBC,  LACTICIDVEN Microbiology Recent Results (from the past 240 hour(s))  Novel Coronavirus,NAA,(SEND-OUT TO REF LAB - TAT 24-48 hrs); Hosp Order     Status: None  Collection Time: 08/09/18  5:48 AM   Specimen: Nasopharyngeal Swab; Respiratory  Result Value Ref Range Status   SARS-CoV-2, NAA NOT DETECTED NOT DETECTED Final    Comment: (NOTE) This test was developed and its performance characteristics determined by [employer]. This test has not been FDA cleared or approved. This test has been authorized by FDA under an Emergency Use Authorization (EUA). This test is only authorized for the duration of time the declaration that circumstances exist justifying the authorization of the emergency use of in vitro diagnostic tests for detection of SARS-CoV-2 virus and/or diagnosis of COVID-19 infection under section 564(b)(1) of the Act, 21 U.S.C. 791TAV-6(P)(7), unless the authorization is terminated or revoked sooner. When diagnostic testing is negative, the possibility of a false negative result should be considered in the context of a patient's recent exposures and the presence of clinical signs and symptoms consistent with COVID-19. An individual without symptoms of COVID-19 and who is not shedding SARS-CoV-2 virus would expect to have a negative (not detected) result in this assay. Performed  At: [facility] [address] [place], [place] [id number] Rush Farmer MD ZS:[id number]    [place]  Final    Comment: Performed at [facility], [place] [address] [place], [place] [address]  Culture, Urine     Status: Abnormal   Collection Time: 08/09/18  3:30 PM   Specimen: Urine, Random  Result Value Ref Range Status   Specimen Description URINE, RANDOM  Final   Special Requests   Final     NONE Performed at [facility], [place] [address], [place], [place] [address]    Culture >=100,000 COLONIES/mL [facility] MORGANII (A)  Final   Report Status 08/11/2018 FINAL  Final   Organism ID, Bacteria MORGANELLA MORGANII (A)  Final      Susceptibility   Morganella morganii - MIC*    AMPICILLIN >=32 RESISTANT Resistant     CEFAZOLIN >=64 RESISTANT Resistant     CEFTRIAXONE <=1 SENSITIVE Sensitive     CIPROFLOXACIN <=0.25 SENSITIVE Sensitive     GENTAMICIN <=1 SENSITIVE Sensitive     IMIPENEM 1 SENSITIVE Sensitive     NITROFURANTOIN RESISTANT Resistant     TRIMETH/SULFA <=20 SENSITIVE Sensitive     AMPICILLIN/SULBACTAM 16 INTERMEDIATE Intermediate     PIP/TAZO <=4 SENSITIVE Sensitive     * >=100,000 COLONIES/mL MORGANELLA MORGANII     Time coordinating discharge: 35  minutes  SIGNED:   Louellen Molder, MD  [facility] 08/14/2018, 2:19 PM Pager   If 7PM-7AM, please contact night-coverage [email or web address] Password [email or web address]

## 2018-08-14 NOTE — TOC Progression Note (Signed)
Transition of Care [facility]) - Progression Note    Patient Details  Name: LIBBI TOWNER MRN: [id number] Date of Birth: Dec 20, 1960  Transition of [facility]) CM/SW [place], [place] Phone Number: 08/14/2018, 1:26 PM  Clinical Narrative:    CSW informed the patient her insurance denied SNF placement. CSW explained her clinicals were evaluated and determine that there was no significant decrease in her activity level and  no change in her level of functioning. CSW advised she had the right to appeal,  patient declined to appeal decision.   Patient states she is comfortable and feels safe returning home. Patient states she has her fiance Barbaraann Rondo there to assist her 24/7.   Patient states she was active with [facility]. SW made referral to [facility] and advised patient is returning home. CSW sent notice to MD to resume [place] orders for PT, RN,Aide and SW.   Thurmond Butts, MSW, [facility] Clinical Social Worker [phone]   Expected Discharge Plan: Skilled Nursing Facility Barriers to Discharge: SNF Pending bed offer, Insurance Authorization  Expected Discharge Plan and Services Expected Discharge Plan: [place] In-house Referral: Clinical Social Work     Living arrangements for the past 2 months: Single Family Home                                       Social Determinants of Health (SDOH) Interventions    Readmission Risk Interventions No flowsheet data found.

## 2018-08-14 NOTE — TOC Initial Note (Signed)
Transition of Care [facility]) - Initial/Assessment Note    Patient Details  Name: Ellen [name] MRN: [id number] Date of Birth: 1960/05/12  Transition of Care [facility]) CM/SW Contact:    Vinie Sill, [place] Phone Number: 08/14/2018, 2:14 PM  Clinical Narrative:                  Patient will DC to: Home  DC Date: 08/14/2018 Family Notified: patient states has contacted her significant other- patient is alert and oriented  Transport By: Corey Harold @ 3:00pm   Ambulance transport requested for patient as requested per patient.    Clinical Social Worker signing off.  Thurmond Butts, MSW, [facility] Clinical Social Worker [phone]    Expected Discharge Plan: Skilled Nursing Facility Barriers to Discharge: Barriers Resolved(insurance denied approval)   Patient Goals and CMS Choice Patient states their goals for this hospitalization and ongoing recovery are:: Get rehab so I can get back home. CMS Medicare.gov Compare Post Acute Care list provided to:: Patient Choice offered to / list presented to : Patient  Expected Discharge Plan and Services Expected Discharge Plan: [place] In-house Referral: Clinical Social Work     Living arrangements for the past 2 months: [place]: RN, PT, Nurse's Aide, Social Work [employer] Agency: [place] ([place]) Date [place]: 08/14/18 Time [place]: 0123 Representative spoke with at [place]: Butch Penny  Prior Living Arrangements/Services Living arrangements for the past 2 months: [place] with:: Self, Significant Other Patient language and need for interpreter reviewed:: No(none needed) Do you feel safe going back to the place where you live?: No(patient wants to go to rehab)      Need for Family Participation in Patient Care: No (Comment) Care giver support system in place?: Yes (comment)   Criminal Activity/Legal Involvement Pertinent  to Current Situation/Hospitalization: No - Comment as needed  Activities of Daily [place] Devices/Equipment: Cane (specify quad or straight), CPAP, Hospital bed, Walker (specify type), Wheelchair ADL Screening (condition at time of admission) Patient's cognitive ability adequate to safely complete daily activities?: Yes Is the patient deaf or have difficulty hearing?: No Does the patient have difficulty seeing, even when wearing glasses/contacts?: No Does the patient have difficulty concentrating, remembering, or making decisions?: No Patient able to express need for assistance with ADLs?: Yes Does the patient have difficulty dressing or bathing?: No Independently performs ADLs?: Yes (appropriate for developmental age) Does the patient have difficulty walking or climbing stairs?: Yes Weakness of Legs: Both Weakness of Arms/Hands: None  Permission Sought/Granted Permission sought to share information with : Facility Sport and exercise psychologist, Tourist information centre manager, Family Supports Permission granted to share information with : Yes, Verbal Permission Granted  Share Information with NAME: Sharma Covert  Permission granted to share info w AGENCY: SNFs  Permission granted to share info w Relationship: significant other  Permission granted to share info w Contact Information: [phone]  Emotional Assessment Appearance:: Appears stated age Attitude/Demeanor/Rapport: Engaged, Ambitious Affect (typically observed): Accepting, Appropriate, Hopeful Orientation: : Oriented to Self, Oriented to Place, Oriented to  Time, Oriented to Situation Alcohol / Substance Use: Not Applicable Psych Involvement: No (comment)  Admission diagnosis:  Chest pain, rule out acute myocardial infarction [R07.9] Patient Active Problem List   Diagnosis Date Noted  . Atypical chest pain 08/10/2018  . Paraplegia ([place])  08/09/2018  . Chest pain 08/09/2018  . Back pain 06/17/2018  . OSA (obstructive sleep  apnea) 06/17/2018  . Hypercalcemia 06/17/2018  . Anxiety 04/06/2017  . Acute respiratory failure with hypoxia ([place])   . Acute hypoxemic respiratory failure ([place]) 04/05/2017  . SIRS (systemic inflammatory response syndrome) ([place]) 04/05/2017  . Elevated troponin 04/05/2017  . Hyponatremia 04/05/2017  . Urgency of micturation 04/14/2015  . Dizziness 04/14/2015  . Neck pain 03/31/2015  . Abnormal uterine bleeding 08/29/2012  . Incarcerated ventral hernia 09/11/2010  . COUGH 05/13/2009  . ALLERGIC RHINITIS, SEASONAL 05/26/2008  . SHOULDER PAIN, LEFT 08/11/2007  . PRURITUS, VAGINAL 09/30/2006  . BACK PAIN 08/05/2006  . 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 LIPOMA 01/08/2006  . Hyperlipidemia 01/08/2006  . Obesity, morbid, BMI 50 or higher ([place]) 01/08/2006  . DEPRESSION 01/08/2006  . Essential hypertension 01/08/2006  . BRONCHITIS NOS 01/08/2006  . GERD 01/08/2006  . HERNIA, VENTRAL 01/08/2006  . ENDOMETRIAL POLYP 01/08/2006  . DEGENERATIVE JOINT DISEASE 01/08/2006   PCP:  Deborah Chalk, FNP Pharmacy:   [id number] [place], [place] [address] Phone: [phone] Fax: [phone]     Social Determinants of Health (SDOH) Interventions    Readmission Risk Interventions No flowsheet data found.

## 2018-08-14 NOTE — Progress Notes (Signed)
RT placed pt on CPAP dream station in auto titrate with settings of max 18 min 4 with no O2 bled into the system. Pt respiratory status stable at this time and tolerating well. RT will continue to monitor.

## 2018-09-10 ENCOUNTER — Encounter: Payer: Self-pay | Admitting: *Deleted

## 2018-09-10 NOTE — Telephone Encounter (Signed)
Left message for pt to call, ov 09/19/2018 appointment needs to be changed to virtual or rescheduled to pa for post hospital.

## 2018-09-17 NOTE — Telephone Encounter (Signed)
This encounter was created in error - please disregard.

## 2018-09-18 ENCOUNTER — Telehealth: Payer: Self-pay | Admitting: Cardiology

## 2018-09-18 NOTE — Telephone Encounter (Signed)

## 2018-09-18 NOTE — Progress Notes (Signed)
Virtual Visit via Video Note changed to phone visit at patient request.   This visit type was conducted due to national recommendations for restrictions regarding the COVID-19 Pandemic (e.g. social distancing) in an effort to limit this patient's exposure and mitigate transmission in our community.  Due to her co-morbid illnesses, this patient is at least at moderate risk for complications without adequate follow up.  This format is felt to be most appropriate for this patient at this time.  All issues noted in this document were discussed and addressed.  A limited physical exam was performed with this format.  Please refer to the patient's chart for her consent to telehealth for [facility].   Date:  09/19/2018   ID:  Ellen [name], DOB 1960-09-19, MRN [id number]  Patient Location:Home Provider Location: Home  PCP:  Deborah Chalk, FNP  Cardiologist:  Dr Stanford Breed  Evaluation Performed:  Follow-Up Visit  Chief Complaint:  FU CP  History of Present Illness:    FU CP.  Patient admitted with atypical chest pain June 2020.  Troponins were 0.08, 0.08 and 0.07.  Echocardiogram June 2020 showed normal LV function.  CTA June 2020 showed no pulmonary embolus. Electrocardiogram showed no acute ST changes.  Pain was felt to be musculoskeletal. Since last seen, patient has had no further chest pain and denies dyspnea.  No palpitations or syncope.  She is unable to ambulate following her back surgery in May.  The patient does not have symptoms concerning for COVID-19 infection (fever, chills, cough, or new shortness of breath).    Past Medical History:  Diagnosis Date  . Bronchitis    hx of  . Degenerative joint disease   . Depression   . ENDOMETRIAL POLYP 01/08/2006   Dr. Agnes Lawrence.  2001:  D&C, benign;  07/14/10:  D&C, hysteroscopy, polypectomy and bx of mons pubis (peau d'orange)...... 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  Marland Kitchen GERD (gastroesophageal  reflux disease)   . Hernia   . High cholesterol   . Hypertension   . Lipoma   . Obesity   . Sleep apnea    uses cpap   Past Surgical History:  Procedure Laterality Date  . CHOLECYSTECTOMY    . DILATION AND CURETTAGE OF UTERUS    . LUMBAR LAMINECTOMY/DECOMPRESSION MICRODISCECTOMY N/A 06/19/2018   Procedure: THORACIC TEN-THORACIC ELEVEN  LAMINECTOMY;  Surgeon: Consuella Lose, MD;  Location: [place];  Service: Neurosurgery;  Laterality: N/A;  posterior  . resection of lipoma    . UMBILICAL HERNIA REPAIR       Current Meds  Medication Sig  . albuterol (PROVENTIL HFA;VENTOLIN HFA) 108 (90 Base) MCG/ACT inhaler Inhale 2 puffs into the lungs every 4 (four) hours as needed for wheezing or shortness of breath.  Marland Kitchen amLODipine (NORVASC) 5 MG tablet Take 5 mg by mouth daily.  Marland Kitchen aspirin (BL ADULT ASPIRIN LOW STRENGTH) 81 MG chewable tablet Chew 81 mg by mouth daily.    Marland Kitchen atorvastatin (LIPITOR) 20 MG tablet Take 20 mg by mouth daily at 6 PM.   . diclofenac sodium (VOLTAREN) 1 % GEL Apply 2-4 g topically every 2 (two) hours as needed (for bilateral knee pain).  Marland Kitchen docusate sodium (COLACE) 100 MG capsule Take 1 capsule (100 mg total) by mouth 2 (two) times daily.  . enalapril (VASOTEC) 20 MG tablet Take 40 mg by mouth daily.   Marland Kitchen escitalopram (LEXAPRO) 10 MG tablet Take 10 mg by mouth daily.  . ferrous sulfate 325 (  65 FE) MG tablet Take 325 mg by mouth daily with breakfast.  . ipratropium (ATROVENT) 0.02 % nebulizer solution Take 2.5 mLs (0.5 mg total) by nebulization every 6 (six) hours as needed for wheezing or shortness of breath.  . levalbuterol (XOPENEX) 0.63 MG/3ML nebulizer solution Take 3 mLs (0.63 mg total) by nebulization every 6 (six) hours as needed for wheezing or shortness of breath.  . medroxyPROGESTERone (PROVERA) 5 MG tablet Take 5 mg by mouth daily.  Marland Kitchen omeprazole (PRILOSEC) 20 MG capsule Take 20 mg by mouth daily before breakfast.  . polyethylene glycol powder (GLYCOLAX/MIRALAX) 17  GM/SCOOP powder Take 17 g by mouth daily as needed for mild constipation.   . pregabalin (LYRICA) 75 MG capsule Take 100 mg by mouth 3 (three) times daily.   Marland Kitchen senna (SENOKOT) 8.6 MG TABS tablet Take 1 tablet (8.6 mg total) by mouth 2 (two) times daily.  . traMADol (ULTRAM) 50 MG tablet Take 50 mg by mouth every 6 (six) hours as needed (for pain).  . vitamin B-12 (CYANOCOBALAMIN) 1000 MCG tablet Take 1 tablet (1,000 mcg total) by mouth daily.     Allergies:   Codeine   Social History   Tobacco Use  . Smoking status: Never Smoker  . Smokeless tobacco: Never Used  Substance Use Topics  . Alcohol use: No    Alcohol/week: 0.0 standard drinks  . Drug use: No     Family Hx: The patient's family history includes Cancer in her father; Diabetes in her mother; Heart disease in her mother.  ROS:   Please see the history of present illness.    Patient describes back and knee pain but no Fever, chills  or productive cough All other systems reviewed and are negative.  Labs/Other Tests and Data Reviewed:    EKG:  An ECG dated 08/09/18 was personally reviewed today and demonstrated:  Sinus rhythm with nonspecific ST changes.  Recent Labs: 06/18/2018: Magnesium 1.6 08/09/2018: Hemoglobin 12.9; Platelets 288 08/11/2018: TSH 1.552 08/13/2018: ALT 13; BUN 7; Creatinine, Ser 0.58; Potassium 3.9; Sodium 138   Recent Lipid Panel Lab Results  Component Value Date/Time   CHOL 157 01/18/2010 08:59 PM   TRIG 138 01/18/2010 08:59 PM   HDL 52 01/18/2010 08:59 PM   CHOLHDL 3.0 Ratio 01/18/2010 08:59 PM   LDLCALC 77 01/18/2010 08:59 PM    Wt Readings from Last 3 Encounters:  09/19/18 (!) 335 lb (152 kg)  08/09/18 (!) 339 lb 4.6 oz (153.9 kg)  06/23/18 (!) 365 lb 11.9 oz (165.9 kg)     Objective:    Vital Signs:  BP 114/60   Pulse 70   Ht 5\' 4"  (1.626 m)   Wt (!) 335 lb (152 kg)   LMP 07/11/2014   BMI 57.50 kg/m    VITAL SIGNS:  reviewed NAD Answers questions appropriately Normal affect  Remainder of physical examination not performed (telehealth visit; coronavirus pandemic)  ASSESSMENT & PLAN:    1. Chest pain-symptoms atypical.  Her enzymes when she was hospitalized not consistent with acute coronary syndrome.  Electrocardiogram showed no acute ST changes.  Echocardiogram showed normal LV function.  We will not pursue further ischemia evaluation. 2. Hypertension-patient's blood pressure is controlled.  Continue present medications and follow. 3. Hyperlipidemia-continue statin.  4. Morbid obesity-continue efforts for weight loss.  COVID-19 Education: The importance of social distancing was discussed today.  Time:   Today, I have spent 18 minutes with the patient with telehealth technology discussing the above problems.  Medication Adjustments/Labs and Tests Ordered: Current medicines are reviewed at length with the patient today.  Concerns regarding medicines are outlined above.   Tests Ordered: No orders of the defined types were placed in this encounter.   Medication Changes: No orders of the defined types were placed in this encounter.   Follow Up:  FU PRN  Signed, Kirk Ruths, MD  09/19/2018 9:13 AM    [place]

## 2018-09-19 ENCOUNTER — Telehealth (INDEPENDENT_AMBULATORY_CARE_PROVIDER_SITE_OTHER): Payer: Medicare HMO | Admitting: Cardiology

## 2018-09-19 ENCOUNTER — Encounter: Payer: Self-pay | Admitting: Cardiology

## 2018-09-19 VITALS — BP 114/60 | HR 70 | Ht 64.0 in | Wt 335.0 lb

## 2018-09-19 DIAGNOSIS — R7989 Other specified abnormal findings of blood chemistry: Secondary | ICD-10-CM

## 2018-09-19 DIAGNOSIS — R072 Precordial pain: Secondary | ICD-10-CM

## 2018-09-19 DIAGNOSIS — I10 Essential (primary) hypertension: Secondary | ICD-10-CM

## 2018-09-19 DIAGNOSIS — E7800 Pure hypercholesterolemia, unspecified: Secondary | ICD-10-CM

## 2018-09-19 DIAGNOSIS — R778 Other specified abnormalities of plasma proteins: Secondary | ICD-10-CM

## 2018-09-19 NOTE — Patient Instructions (Signed)
Medication Instructions:  NO CHANGE If you need a refill on your cardiac medications before your next appointment, please call your pharmacy.   Lab work: If you have labs (blood work) drawn today and your tests are completely normal, you will receive your results only by: . MyChart Message (if you have MyChart) OR . A paper copy in the mail If you have any lab test that is abnormal or we need to change your treatment, we will call you to review the results.  Follow-Up: At [facility], you and your health needs are our priority.  As part of our continuing mission to provide you with exceptional heart care, we have created designated Provider Care Teams.  These Care Teams include your primary Cardiologist (physician) and Advanced Practice Providers (APPs -  Physician Assistants and Nurse Practitioners) who all work together to provide you with the care you need, when you need it. Your physician recommends that you schedule a follow-up appointment in: AS NEEDED     

## 2018-09-25 ENCOUNTER — Emergency Department (HOSPITAL_COMMUNITY): Payer: Medicare HMO

## 2018-09-25 ENCOUNTER — Emergency Department (HOSPITAL_COMMUNITY)
Admission: EM | Admit: 2018-09-25 | Discharge: 2018-09-25 | Disposition: A | Discharge status: Home / Self Care | Payer: Medicare HMO | Attending: Emergency Medicine | Admitting: Emergency Medicine

## 2018-09-25 ENCOUNTER — Other Ambulatory Visit: Payer: Self-pay

## 2018-09-25 DIAGNOSIS — Z79899 Other long term (current) drug therapy: Secondary | ICD-10-CM | POA: Diagnosis not present

## 2018-09-25 DIAGNOSIS — G8220 Paraplegia, unspecified: Secondary | ICD-10-CM | POA: Diagnosis not present

## 2018-09-25 DIAGNOSIS — Z7982 Long term (current) use of aspirin: Secondary | ICD-10-CM | POA: Insufficient documentation

## 2018-09-25 DIAGNOSIS — I10 Essential (primary) hypertension: Secondary | ICD-10-CM | POA: Insufficient documentation

## 2018-09-25 DIAGNOSIS — M25562 Pain in left knee: Secondary | ICD-10-CM | POA: Diagnosis present

## 2018-09-25 DIAGNOSIS — M62838 Other muscle spasm: Secondary | ICD-10-CM | POA: Diagnosis not present

## 2018-09-25 DIAGNOSIS — W050XXA Fall from non-moving wheelchair, initial encounter: Secondary | ICD-10-CM | POA: Diagnosis not present

## 2018-09-25 MED ORDER — HYDROCODONE-ACETAMINOPHEN 5-325 MG PO TABS
1.0000 | ORAL_TABLET | Freq: Once | ORAL | Status: AC
  Administered 2018-09-25: 1 via ORAL
  Filled 2018-09-25: qty 1

## 2018-09-25 NOTE — ED Provider Notes (Signed)
[place] DEPT Provider Note   CSN: [id number] Arrival date & time: 09/25/18  1829    History   Chief Complaint Chief Complaint  Patient presents with  . Knee Pain    HPI Ellen [name] is a 58 y.o. female.     Patient with history of paraplegia from T10 down, fell out of her wheelchair yesterday and hit the left knee.  She has been having some contractions in her left lower leg and concern for knee injury.  She has no sensation below her waist.  She is also trying to obtain more resources at home to help with physical therapy and getting around the house.  The history is provided by the patient.  Illness Location:  Left knee Severity:  Mild Onset quality:  Gradual Timing:  Intermittent Progression:  Waxing and waning Chronicity:  New Relieved by:  Nothing Worsened by:  Nothing Associated symptoms: no abdominal pain, no chest pain, no cough, no ear pain, no fever, no rash, no shortness of breath, no sore throat and no vomiting     Past Medical History:  Diagnosis Date  . Bronchitis    hx of  . Degenerative joint disease   . Depression   . ENDOMETRIAL POLYP 01/08/2006   Dr. Agnes Lawrence.  2001:  D&C, benign;  07/14/10:  D&C, hysteroscopy, polypectomy and bx of mons pubis (peau d'orange)...... 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  Marland Kitchen GERD (gastroesophageal reflux disease)   . Hernia   . High cholesterol   . Hypertension   . Lipoma   . Obesity   . Sleep apnea    uses cpap    Patient Active Problem List   Diagnosis Date Noted  . Acute urinary retention 08/14/2018  . Acute lower UTI 08/14/2018  . Atypical chest pain 08/10/2018  . Paraplegia ([place]) 08/09/2018  . Chest pain 08/09/2018  . Back pain 06/17/2018  . OSA (obstructive sleep apnea) 06/17/2018  . Hypercalcemia 06/17/2018  . Anxiety 04/06/2017  . Acute respiratory failure with hypoxia ([place])   . Acute hypoxemic respiratory failure ([place])  04/05/2017  . SIRS (systemic inflammatory response syndrome) ([place]) 04/05/2017  . Elevated troponin 04/05/2017  . Hyponatremia 04/05/2017  . Urgency of micturation 04/14/2015  . Dizziness 04/14/2015  . Neck pain 03/31/2015  . Abnormal uterine bleeding 08/29/2012  . Incarcerated ventral hernia 09/11/2010  . COUGH 05/13/2009  . ALLERGIC RHINITIS, SEASONAL 05/26/2008  . SHOULDER PAIN, LEFT 08/11/2007  . PRURITUS, VAGINAL 09/30/2006  . BACK PAIN 08/05/2006  . 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 LIPOMA 01/08/2006  . Hyperlipidemia 01/08/2006  . Obesity, morbid, BMI 50 or higher ([name]) 01/08/2006  . DEPRESSION 01/08/2006  . Essential hypertension 01/08/2006  . BRONCHITIS NOS 01/08/2006  . GERD 01/08/2006  . HERNIA, VENTRAL 01/08/2006  . ENDOMETRIAL POLYP 01/08/2006  . DEGENERATIVE JOINT DISEASE 01/08/2006    Past Surgical History:  Procedure Laterality Date  . CHOLECYSTECTOMY    . DILATION AND CURETTAGE OF UTERUS    . LUMBAR LAMINECTOMY/DECOMPRESSION MICRODISCECTOMY N/A 06/19/2018   Procedure: THORACIC TEN-THORACIC ELEVEN  LAMINECTOMY;  Surgeon: Consuella Lose, MD;  Location: [place];  Service: Neurosurgery;  Laterality: N/A;  posterior  . resection of lipoma    . UMBILICAL HERNIA REPAIR       OB History   No obstetric history on file.      Home Medications    Prior to Admission medications   Medication Sig Start Date End Date Taking? Authorizing  Provider  albuterol (PROVENTIL HFA;VENTOLIN HFA) 108 (90 Base) MCG/ACT inhaler Inhale 2 puffs into the lungs every 4 (four) hours as needed for wheezing or shortness of breath. 11/29/15   Duffy Bruce, MD  amLODipine (NORVASC) 5 MG tablet Take 5 mg by mouth daily.    [provider]  aspirin (BL ADULT ASPIRIN LOW STRENGTH) 81 MG chewable tablet Chew 81 mg by mouth daily.      [provider]  atorvastatin (LIPITOR) 20 MG tablet Take 20 mg by mouth daily at 6 PM.  03/28/17   [provider]  diclofenac  sodium (VOLTAREN) 1 % GEL Apply 2-4 g topically every 2 (two) hours as needed (for bilateral knee pain).    [provider]  docusate sodium (COLACE) 100 MG capsule Take 1 capsule (100 mg total) by mouth 2 (two) times daily. 06/27/18   Hosie Poisson, MD  enalapril (VASOTEC) 20 MG tablet Take 40 mg by mouth daily.     [provider]  escitalopram (LEXAPRO) 10 MG tablet Take 10 mg by mouth daily.    [provider]  ferrous sulfate 325 (65 FE) MG tablet Take 325 mg by mouth daily with breakfast.    [provider]  ipratropium (ATROVENT) 0.02 % nebulizer solution Take 2.5 mLs (0.5 mg total) by nebulization every 6 (six) hours as needed for wheezing or shortness of breath. 04/08/17   Sheikh, Omair Latif, DO  levalbuterol (XOPENEX) 0.63 MG/3ML nebulizer solution Take 3 mLs (0.63 mg total) by nebulization every 6 (six) hours as needed for wheezing or shortness of breath. 04/08/17   Raiford Noble Latif, DO  medroxyPROGESTERone (PROVERA) 5 MG tablet Take 5 mg by mouth daily.    [provider]  omeprazole (PRILOSEC) 20 MG capsule Take 20 mg by mouth daily before breakfast.    [provider]  polyethylene glycol powder (GLYCOLAX/MIRALAX) 17 GM/SCOOP powder Take 17 g by mouth daily as needed for mild constipation.     [provider]  pregabalin (LYRICA) 75 MG capsule Take 100 mg by mouth 3 (three) times daily.     [provider]  senna (SENOKOT) 8.6 MG TABS tablet Take 1 tablet (8.6 mg total) by mouth 2 (two) times daily. 06/27/18   Hosie Poisson, MD  traMADol (ULTRAM) 50 MG tablet Take 50 mg by mouth every 6 (six) hours as needed (for pain).    [provider]  vitamin B-12 (CYANOCOBALAMIN) 1000 MCG tablet Take 1 tablet (1,000 mcg total) by mouth daily. 08/14/18   Dhungel, Flonnie Overman, MD    Family History Family History  Problem Relation Age of Onset  . Heart disease Mother   . Diabetes Mother   . Cancer Father     Social  History Social History   Tobacco Use  . Smoking status: Never Smoker  . Smokeless tobacco: Never Used  Substance Use Topics  . Alcohol use: No    Alcohol/week: 0.0 standard drinks  . Drug use: No     Allergies   Codeine   Review of Systems Review of Systems  Constitutional: Negative for chills and fever.  HENT: Negative for ear pain and sore throat.   Eyes: Negative for pain and visual disturbance.  Respiratory: Negative for cough and shortness of breath.   Cardiovascular: Negative for chest pain and palpitations.  Gastrointestinal: Negative for abdominal pain and vomiting.  Genitourinary: Negative for dysuria and hematuria.  Musculoskeletal: Negative for arthralgias and back pain.  Stiffness in LLE  Skin: Negative for color change and rash.  Neurological: Negative for seizures and syncope.  All other systems reviewed and are negative.    Physical Exam Updated Vital Signs BP (!) 114/52 (BP Location: Right Arm)   Pulse 86   Temp 98.8 F (37.1 C) (Oral)   Resp 18   Ht 5\' 5"  (1.651 m)   Wt (!) 150.6 kg   LMP 07/11/2014   SpO2 98%   BMI 55.25 kg/m   Physical Exam Vitals signs and nursing note reviewed.  Constitutional:      General: She is not in acute distress.    Appearance: She is well-developed. She is not ill-appearing.  HENT:     Head: Normocephalic and atraumatic.     Nose: Nose normal.     Mouth/Throat:     Mouth: Mucous membranes are moist.     Pharynx: No oropharyngeal exudate.  Eyes:     Extraocular Movements: Extraocular movements intact.     Conjunctiva/sclera: Conjunctivae normal.     Pupils: Pupils are equal, round, and reactive to light.  Neck:     Musculoskeletal: Normal range of motion and neck supple.  Cardiovascular:     Rate and Rhythm: Normal rate and regular rhythm.     Pulses: Normal pulses.     Heart sounds: Normal heart sounds. No murmur.  Pulmonary:     Effort: Pulmonary effort is normal. No respiratory distress.      Breath sounds: Normal breath sounds.  Abdominal:     Palpations: Abdomen is soft.     Tenderness: There is no abdominal tenderness.  Musculoskeletal: Normal range of motion.        General: No swelling or tenderness.  Skin:    General: Skin is warm and dry.  Neurological:     Mental Status: She is alert. Mental status is at baseline.     Comments: Patient with 0 out of 5 strength in bilateral lower extremities which is baseline, no sensation below her navel      ED Treatments / Results  Labs (all labs ordered are listed, but only abnormal results are displayed) Labs Reviewed - No data to display  EKG None  Radiology Dg Knee Complete 4 Views Left  Result Date: 09/25/2018 CLINICAL DATA:  Fall. Chronic left knee pain. EXAM: LEFT KNEE - COMPLETE 4+ VIEW COMPARISON:  04/22/2018 FINDINGS: The patient has severe tricompartmental osteoarthritis with marked joint space narrowing in all 3 compartments. Multiple ossified loose bodies throughout the joint. Joint effusion. No fracture or dislocation. IMPRESSION: No acute abnormality. Severe tricompartmental osteoarthritis. No change since the prior study. Electronically Signed   By: Lorriane Shire M.D.   On: 09/25/2018 21:02    Procedures Procedures (including critical care time)  Medications Ordered in ED Medications  HYDROcodone-acetaminophen (NORCO/VICODIN) 5-325 MG per tablet 1 tablet (has no administration in time range)     Initial Impression / Assessment and Plan / ED Course  I have reviewed the triage vital signs and the nursing notes.  Pertinent labs & imaging results that were available during my care of the patient were reviewed by me and considered in my medical decision making (see chart for details).     OANH DEVIVO is a 58 year old female with history of paraplegia from T10 down who presents to the ED with left knee issue.  Patient states that she fell out of her wheelchair yesterday landing on her left knee.   Patient has no strength in  her legs at baseline.  Decreased mentation at baseline.  Neurologically she is overall at her baseline.  She has had some spasms in her left lower extremity.  X-ray showed no acute findings of her left knee.  She states that she is also concerned about how she is getting around that her home.  She used to have physical therapy but now she has not had many resources at home to help get around.  Case management and home health orders were placed.  Overall she was given reassurance.  Believe that she would benefit from further physical therapy and assistance at home.  Discharged from ED in good condition.  Recommend follow-up with surgeon.  Not sure she would benefit from baclofen or other antispasmodics.  This chart was dictated using voice recognition software.  Despite best efforts to proofread,  errors can occur which can change the documentation meaning.    Final Clinical Impressions(s) / ED Diagnoses   Final diagnoses:  Muscle spasm    ED Discharge Orders    None       Lennice Sites, DO 09/25/18 2123

## 2018-09-25 NOTE — ED Triage Notes (Signed)
Arrived by [facility] for c/o chronic left knee pain. Patient reports she cannot have surgery on knee until she loses weight. Patient has hx of partial lower extremity paralysis. GCS 15

## 2018-09-26 NOTE — Progress Notes (Signed)
Medicare.gov - the Conservation officer, historic buildings for [facility] health agencies that serve [phone]. [place] Quality of Patient Care Rating Patient Survey Summary Rating [facility] [phone] 3 out of 5 stars 4 out of [place] [phone] 3  out of 5 stars 4 out of [place] [phone] 4 out of 5 stars 4 out of [place] [phone] 4 out of 5 stars 4 out of [place] [phone] 4  out of 5 stars 4 out of 5 stars [place] [phone] 4 out of 5 stars 4 out of 5 stars [place] [phone] 4 out of 5 stars 4 out of [place] [phone] 4 out of 5 stars 4 out of [place] [phone] 3  out of 5 stars 4 out of [place] [phone] 2  out of 5 stars 3 out of [place] [phone] 3 out of 5 stars 4 out of 5 stars [facility] [phone] 4 out of 5 stars Not [place] [phone] 3 out of 5 stars 4 out of 5 stars [facility] [phone] 3  out of 5 stars 3 out of 5 stars [facility] [phone] 3  out of 5 stars Not Available9 [facility] [phone] 3  out of 5 stars 4 out of [place] [phone] 3  out of 5 stars 4 out of [place] [phone] 3  out of 5 stars 3 out of [place] [phone] 3  out of 5 stars Not [place] [phone] 3 out of 5 stars 3 out of [place] [phone] 3  out of 5  stars 4 out of [place] [phone] 4  out of 5 stars 3 out of [place] number Footnote as displayed on [place] 1 This agency provides services under a federal waiver program to non-traditional, chronic long term population. 2 This agency provides services to a special needs population. 3 Not Available. 4 The number of patient episodes for this measure is too small to report. 5 This measure currently does not have data or provider has been certified/recertified for less than 6 months. 6 The national average for this measure is not provided because of state-to-state differences in data collection. 7 Medicare is not displaying rates for this measure for any home health agency, because of an issue with the data. 8 There were problems with the data and they are being corrected. 9 Zero, or very few, patients met the survey's rules for inclusion. The scores shown, if any, reflect a very small number of surveys and may not accurately tell how an agency is doing. 10 Survey results are based on less than 12 months of data. 11 Fewer than 70 patients completed the survey. Use the scores shown, if any, with caution as the number of surveys may be too low to accurately tell how an agency  is doing. 12 No survey results are available for this period. 13 Data suppressed by CMS for one or more quarters.

## 2018-09-26 NOTE — TOC Initial Note (Addendum)
Transition of Care [facility]) - Initial/Assessment Note    Patient Details  Name: Ellen [name] MRN: [id number] Date of Birth: Aug 15, 1960  Transition of Care [facility]) CM/SW Contact:    Erenest Rasher, RN Phone Number:[phone] 09/26/2018, 1:15 PM  Clinical Narrative:                 Spoke to pt and offered choice for [facility]. Pt states she does not want [facility]. Had in the past. She is agreeable to [facility]. Contacted [place], [facility] rep. States her fiance and nephew assist her at home with getting ready for appt. Has [facility] for transportation. States she had PCS services in the past. Faxed PCS paperwork to her PCP to complete for expedited assessment. Gave updated to [facility] rep to have [place] SW follow up on application.   Expected Discharge Plan: [place] Barriers to Discharge: No Barriers Identified   Patient Goals and CMS Choice Patient states their goals for this hospitalization and ongoing recovery are:: want rehab again CMS Medicare.gov Compare Post Acute Care list provided to:: Patient Choice offered to / list presented to : Patient  Expected Discharge Plan and Services Expected Discharge Plan: [place] Choice: [place] arrangements for the past 2 months: Single Family Home                           HH Arranged: PT, OT, Nurse's Aide, Social Work [employer] Agency: [facility] [employer] [facility] [employer] Date [place]: 09/26/18 Time [place]: [place] Representative spoke with at [place]: Burlene Arnt  Prior Living Arrangements/Services Living arrangements for the past 2 months: [place] with:: Significant Other Patient language and need for interpreter reviewed:: Yes Do you feel safe going back to the place where you live?: Yes      Need for Family Participation in Patient Care: Yes (Comment) Care giver support system in place?: Yes (comment) Current home  services: DME(wheelchair) Criminal Activity/Legal Involvement Pertinent to Current Situation/Hospitalization: No - Comment as needed  Activities of Daily Living      Permission Sought/Granted Permission sought to share information with : Case Manager, PCP, Family Supports, Other (comment) Permission granted to share information with : Yes, Verbal Permission Granted  Share Information with NAME: Kathaleen Grinder  Permission granted to share info w AGENCY: [place] granted to share info w Relationship: fiance  Permission granted to share info w Contact Information: [phone]  Emotional Assessment       Orientation: : Oriented to Self, Oriented to Place, Oriented to  Time, Oriented to Situation   Psych Involvement: No (comment)  Admission diagnosis:  Knee Pain; Leg Numbness Patient Active Problem List   Diagnosis Date Noted  . Acute urinary retention 08/14/2018  . Acute lower UTI 08/14/2018  . Atypical chest pain 08/10/2018  . Paraplegia ([place]) 08/09/2018  . Chest pain 08/09/2018  . Back pain 06/17/2018  . OSA (obstructive sleep apnea) 06/17/2018  . Hypercalcemia 06/17/2018  . Anxiety 04/06/2017  . Acute respiratory failure with hypoxia ([place])   . Acute hypoxemic respiratory failure ([place]) 04/05/2017  . SIRS (systemic inflammatory response syndrome) ([place]) 04/05/2017  . Elevated troponin 04/05/2017  . Hyponatremia 04/05/2017  . Urgency of micturation 04/14/2015  . Dizziness 04/14/2015  . Neck pain 03/31/2015  . Abnormal uterine bleeding 08/29/2012  .  Incarcerated ventral hernia 09/11/2010  . COUGH 05/13/2009  . ALLERGIC RHINITIS, SEASONAL 05/26/2008  . SHOULDER PAIN, LEFT 08/11/2007  . PRURITUS, VAGINAL 09/30/2006  . BACK PAIN 08/05/2006  . 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 LIPOMA 01/08/2006  . Hyperlipidemia 01/08/2006  . Obesity, morbid, BMI 50 or higher ([place]) 01/08/2006  . DEPRESSION 01/08/2006  . Essential hypertension 01/08/2006  . BRONCHITIS  NOS 01/08/2006  . GERD 01/08/2006  . HERNIA, VENTRAL 01/08/2006  . ENDOMETRIAL POLYP 01/08/2006  . DEGENERATIVE JOINT DISEASE 01/08/2006   PCP:  Deborah Chalk, FNP Pharmacy:   [id number] [place], [address] [place] [address] Phone: [phone] Fax: [phone]     Social Determinants of Health (SDOH) Interventions    Readmission Risk Interventions No flowsheet data found.

## 2018-09-28 ENCOUNTER — Other Ambulatory Visit: Payer: Self-pay

## 2018-09-28 ENCOUNTER — Emergency Department (HOSPITAL_COMMUNITY): Payer: Medicare HMO

## 2018-09-28 ENCOUNTER — Telehealth: Payer: Self-pay | Admitting: Surgery

## 2018-09-28 ENCOUNTER — Encounter (HOSPITAL_COMMUNITY): Payer: Self-pay | Admitting: Emergency Medicine

## 2018-09-28 ENCOUNTER — Emergency Department (HOSPITAL_COMMUNITY)
Admission: EM | Admit: 2018-09-28 | Discharge: 2018-09-28 | Disposition: A | Discharge status: Home / Self Care | Payer: Medicare HMO | Attending: Emergency Medicine | Admitting: Emergency Medicine

## 2018-09-28 DIAGNOSIS — R300 Dysuria: Secondary | ICD-10-CM | POA: Insufficient documentation

## 2018-09-28 DIAGNOSIS — R072 Precordial pain: Secondary | ICD-10-CM | POA: Insufficient documentation

## 2018-09-28 DIAGNOSIS — I10 Essential (primary) hypertension: Secondary | ICD-10-CM | POA: Diagnosis not present

## 2018-09-28 DIAGNOSIS — R0789 Other chest pain: Secondary | ICD-10-CM | POA: Diagnosis present

## 2018-09-28 DIAGNOSIS — Z79899 Other long term (current) drug therapy: Secondary | ICD-10-CM | POA: Insufficient documentation

## 2018-09-28 DIAGNOSIS — G8220 Paraplegia, unspecified: Secondary | ICD-10-CM | POA: Diagnosis not present

## 2018-09-28 DIAGNOSIS — Z7982 Long term (current) use of aspirin: Secondary | ICD-10-CM | POA: Insufficient documentation

## 2018-09-28 LAB — COMPREHENSIVE METABOLIC PANEL
ALT: 13 U/L (ref 0–44)
AST: 15 U/L (ref 15–41)
Albumin: 3.2 g/dL — ABNORMAL LOW (ref 3.5–5.0)
Alkaline Phosphatase: 87 U/L (ref 38–126)
Anion gap: 7 (ref 5–15)
BUN: 10 mg/dL (ref 6–20)
CO2: 27 mmol/L (ref 22–32)
Calcium: 12.1 mg/dL — ABNORMAL HIGH (ref 8.9–10.3)
Chloride: 105 mmol/L (ref 98–111)
Creatinine, Ser: 0.57 mg/dL (ref 0.44–1.00)
GFR calc Af Amer: >60 mL/min (ref >60)
GFR calc non Af Amer: >60 mL/min (ref >60)
Glucose, Bld: 100 mg/dL — ABNORMAL HIGH (ref 70–99)
Potassium: 4.3 mmol/L (ref 3.5–5.1)
Sodium: 139 mmol/L (ref 135–145)
Total Bilirubin: 0.7 mg/dL (ref 0.3–1.2)
Total Protein: 6.7 g/dL (ref 6.5–8.1)

## 2018-09-28 LAB — CBC WITH DIFFERENTIAL/PLATELET
Abs Immature Granulocytes: 0.02 10*3/uL (ref 0.00–0.07)
Basophils Absolute: 0 10*3/uL (ref 0.0–0.1)
Basophils Relative: 0 %
Eosinophils Absolute: 0.2 10*3/uL (ref 0.0–0.5)
Eosinophils Relative: 3 %
HCT: 39.6 % (ref 36.0–46.0)
Hemoglobin: 12.8 g/dL (ref 12.0–15.0)
Immature Granulocytes: 0 %
Lymphocytes Relative: 36 %
Lymphs Abs: 2.3 10*3/uL (ref 0.7–4.0)
MCH: 31.1 pg (ref 26.0–34.0)
MCHC: 32.3 g/dL (ref 30.0–36.0)
MCV: 96.1 fL (ref 80.0–100.0)
Monocytes Absolute: 0.5 10*3/uL (ref 0.1–1.0)
Monocytes Relative: 8 %
Neutro Abs: 3.4 10*3/uL (ref 1.7–7.7)
Neutrophils Relative %: 53 %
Platelets: 146 10*3/uL — ABNORMAL LOW (ref 150–400)
RBC: 4.12 MIL/uL (ref 3.87–5.11)
RDW: 12.8 % (ref 11.5–15.5)
WBC: 6.4 10*3/uL (ref 4.0–10.5)
nRBC: 0 % (ref 0.0–0.2)

## 2018-09-28 LAB — TROPONIN I (HIGH SENSITIVITY)
Troponin I (High Sensitivity): 2 ng/L (ref <18)
Troponin I (High Sensitivity): <2 ng/L (ref <18)

## 2018-09-28 LAB — CBG MONITORING, ED: Glucose-Capillary: 70 mg/dL (ref 70–99)

## 2018-09-28 LAB — URINALYSIS, ROUTINE W REFLEX MICROSCOPIC
Bilirubin Urine: NEGATIVE
Glucose, UA: NEGATIVE mg/dL
Hgb urine dipstick: NEGATIVE
Ketones, ur: NEGATIVE mg/dL
Leukocytes,Ua: NEGATIVE
Nitrite: NEGATIVE
Protein, ur: NEGATIVE mg/dL
Specific Gravity, Urine: 1.015 (ref 1.005–1.030)
pH: 7 (ref 5.0–8.0)

## 2018-09-28 LAB — I-STAT BETA HCG BLOOD, ED (MC, WL, AP ONLY): I-stat hCG, quantitative: <5 m[IU]/mL (ref <5)

## 2018-09-28 MED ORDER — KETOROLAC TROMETHAMINE 30 MG/ML IJ SOLN
30.0000 mg | Freq: Once | INTRAMUSCULAR | Status: AC
  Administered 2018-09-28: 30 mg via INTRAVENOUS
  Filled 2018-09-28: qty 1

## 2018-09-28 NOTE — Discharge Instructions (Signed)
You were seen in the hospital for chest pain.  Everything with your heart looks fine.  It is caused by musculoskeletal pain.  You can take ibuprofen as directed on the bottle for pain.  You can also use heat on your chest for pain.  Someone from home health and social work will be contacting you about placement to a facility and your home health needs.  Return to care if you have worsening chest pain, shortness of breath, associated dizziness or sweating.

## 2018-09-28 NOTE — Care Management (Signed)
ED CM met with patient to discuss transitional care needs. Patient is bed bound and was receiving HH in the past patient's goal is to be placed in a Rehab facility to work towards recovery. CM explained to patient that patient is not meeting any criteria for an admission but we can arrange [facility] services and include a SW to assist with the process. Patient is agreeable. Patient reports using [facility]  services in the past and wants to establish services with them again. CM faxed referral to [facility]  Information was placed on patients AVS CM will follow up with referral tomorrow.

## 2018-09-28 NOTE — ED Notes (Signed)
Pt is hooked up to purewick and suction canister.

## 2018-09-28 NOTE — ED Provider Notes (Signed)
[facility] EMERGENCY DEPARTMENT Provider Note   CSN: [id number] Arrival date & time: 09/28/18  7564    History   Chief Complaint Chief Complaint  Patient presents with   Chest Pain    HPI Ellen [name] is a 58 y.o. female with PMH paraplegia from T10 down, HTN, HLD, morbid obesity, GERD, OSA.  History provided by patient.  Patient reports substernal chest pain since 2000 yesterday.  States that pain is substernal in nature, does not radiate, no associated shortness of breath, diaphoresis, nausea.  Rates pain 9/10 at worst, says it was constant overnight.  This a.m., called EMS to get his pain he continued.  Patient was given 324 mg ASA by EMS per her report.  She stated that this improved her pain to 8/10.  She states that this pain is similar to her recent episode.  It is worsened by movements, including going from lying to sitting up, twisting side to side, touching her chest.  She notes that lying back and resting without movement makes it better.  She denies any fevers, chills, cough, sick contacts.  She states she does feel like she has burning with urination and is concerned for UTI.  Patient has had recent admission in June 2020 for chest pain as well.  Was seen by cardiology at that time, had echo with normal ejection fraction, did show impaired relaxation, also CTA with no pulmonary embolus.  Patient was seen in telemedicine visit on 7/31 by cardiology.  Chest pain felt to be musculoskeletal in origin, patient advised follow-up with cardiology as needed.  Of note patient has been paraplegic for several months, s/p decompressive thoracic laminectomy in April.  She has been living at home with her fianc, and having home health aides come once a week.  She notes that she has not had someone come in the last 2 weeks.  States that she needs more help at home, and has been working on this.  Per nursing report, EMS had noted that patient's home was in disarray and  patient was lying in soiled bed that was concave done.     Past Medical History:  Diagnosis Date   Bronchitis    hx of   Degenerative joint disease    Depression    ENDOMETRIAL POLYP 01/08/2006   Dr. Agnes Lawrence.  2001:  D&C, benign;  07/14/10:  D&C, hysteroscopy, polypectomy and bx of mons pubis (peau d'orange)...... 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   GERD (gastroesophageal reflux disease)    Hernia    High cholesterol    Hypertension    Lipoma    Obesity    Sleep apnea    uses cpap    Patient Active Problem List   Diagnosis Date Noted   Acute urinary retention 08/14/2018   Acute lower UTI 08/14/2018   Atypical chest pain 08/10/2018   Paraplegia ([place]) 08/09/2018   Chest pain 08/09/2018   Back pain 06/17/2018   OSA (obstructive sleep apnea) 06/17/2018   Hypercalcemia 06/17/2018   Anxiety 04/06/2017   Acute respiratory failure with hypoxia (HCC)    Acute hypoxemic respiratory failure ([place]) 04/05/2017   SIRS (systemic inflammatory response syndrome) ([place]) 04/05/2017   Elevated troponin 04/05/2017   Hyponatremia 04/05/2017   Urgency of micturation 04/14/2015   Dizziness 04/14/2015   Neck pain 03/31/2015   Abnormal uterine bleeding 08/29/2012   Incarcerated ventral hernia 09/11/2010   COUGH 05/13/2009   ALLERGIC RHINITIS, SEASONAL 05/26/2008   SHOULDER PAIN,  LEFT 08/11/2007   PRURITUS, VAGINAL 09/30/2006   BACK PAIN 08/05/2006   Arthritis of both knees 02/15/2006   LIPOMA 01/08/2006   Hyperlipidemia 01/08/2006   Obesity, morbid, BMI 50 or higher ([place]) 01/08/2006   DEPRESSION 01/08/2006   Essential hypertension 01/08/2006   BRONCHITIS NOS 01/08/2006   GERD 01/08/2006   HERNIA, VENTRAL 01/08/2006   ENDOMETRIAL POLYP 01/08/2006   DEGENERATIVE JOINT DISEASE 01/08/2006    Past Surgical History:  Procedure Laterality Date   CHOLECYSTECTOMY     DILATION AND CURETTAGE OF UTERUS       LUMBAR LAMINECTOMY/DECOMPRESSION MICRODISCECTOMY N/A 06/19/2018   Procedure: THORACIC TEN-THORACIC ELEVEN  LAMINECTOMY;  Surgeon: Consuella Lose, MD;  Location: [facility];  Service: Neurosurgery;  Laterality: N/A;  posterior   resection of lipoma     UMBILICAL HERNIA REPAIR       OB History   No obstetric history on file.      Home Medications    Prior to Admission medications   Medication Sig Start Date End Date Taking? Authorizing Provider  albuterol (PROVENTIL HFA;VENTOLIN HFA) 108 (90 Base) MCG/ACT inhaler Inhale 2 puffs into the lungs every 4 (four) hours as needed for wheezing or shortness of breath. 11/29/15  Yes Duffy Bruce, MD  amLODipine (NORVASC) 5 MG tablet Take 5 mg by mouth daily.   Yes [provider]  aspirin (BL ADULT ASPIRIN LOW STRENGTH) 81 MG chewable tablet Chew 81 mg by mouth daily.     Yes [provider]  atorvastatin (LIPITOR) 20 MG tablet Take 20 mg by mouth daily at 6 PM.  03/28/17  Yes [provider]  diclofenac sodium (VOLTAREN) 1 % GEL Apply 2-4 g topically every 2 (two) hours as needed (for bilateral knee pain).   Yes [provider]  docusate sodium (COLACE) 100 MG capsule Take 1 capsule (100 mg total) by mouth 2 (two) times daily. 06/27/18  Yes Hosie Poisson, MD  enalapril (VASOTEC) 20 MG tablet Take 40 mg by mouth daily.    Yes [provider]  escitalopram (LEXAPRO) 10 MG tablet Take 10 mg by mouth daily.   Yes [provider]  ferrous sulfate 325 (65 FE) MG tablet Take 325 mg by mouth daily with breakfast.   Yes [provider]  medroxyPROGESTERone (PROVERA) 5 MG tablet Take 5 mg by mouth daily.   Yes [provider]  omeprazole (PRILOSEC) 20 MG capsule Take 20 mg by mouth daily before breakfast.   Yes [provider]  oxybutynin (DITROPAN-XL) 10 MG 24 hr tablet Take 10 mg by mouth daily. 08/29/18  Yes [provider]  polyethylene glycol powder  (GLYCOLAX/MIRALAX) 17 GM/SCOOP powder Take 17 g by mouth daily as needed for mild constipation.    Yes [provider]  pregabalin (LYRICA) 75 MG capsule Take 100 mg by mouth 3 (three) times daily.    Yes [provider]  senna (SENOKOT) 8.6 MG TABS tablet Take 1 tablet (8.6 mg total) by mouth 2 (two) times daily. 06/27/18  Yes Hosie Poisson, MD  traMADol (ULTRAM) 50 MG tablet Take 50 mg by mouth every 6 (six) hours as needed (for pain).   Yes [provider]  vitamin B-12 (CYANOCOBALAMIN) 1000 MCG tablet Take 1 tablet (1,000 mcg total) by mouth daily. 08/14/18  Yes Dhungel, Nishant, MD  amoxicillin-clavulanate (AUGMENTIN) 875-125 MG tablet Take 1 tablet by mouth every 12 (twelve) hours. 09/20/18   [provider]  ipratropium (ATROVENT) 0.02 % nebulizer solution Take 2.5  mLs (0.5 mg total) by nebulization every 6 (six) hours as needed for wheezing or shortness of breath. 04/08/17   Sheikh, Omair Latif, DO  levalbuterol (XOPENEX) 0.63 MG/3ML nebulizer solution Take 3 mLs (0.63 mg total) by nebulization every 6 (six) hours as needed for wheezing or shortness of breath. 04/08/17   Kerney Elbe, DO    Family History Family History  Problem Relation Age of Onset   Heart disease Mother    Diabetes Mother    Cancer Father     Social History Social History   Tobacco Use   Smoking status: Never Smoker   Smokeless tobacco: Never Used  Substance Use Topics   Alcohol use: No    Alcohol/week: 0.0 standard drinks   Drug use: No     Allergies   Codeine   Review of Systems Review of Systems  Constitutional: Negative for activity change, chills, diaphoresis, fatigue and fever.  HENT: Negative for congestion and sore throat.   Eyes: Negative for visual disturbance.  Respiratory: Negative for cough and shortness of breath.        OSA, does not use CPAP  Cardiovascular: Positive for chest pain. Negative for palpitations and leg swelling.    Gastrointestinal: Negative for abdominal pain, blood in stool, constipation, diarrhea, nausea and vomiting.  Genitourinary: Positive for dysuria. Negative for frequency and urgency.  Musculoskeletal: Negative for back pain.  Neurological: Negative for dizziness and headaches.       Paraplegic from T10 down     Physical Exam Updated Vital Signs BP 123/86    Pulse 64    Temp 98.3 F (36.8 C) (Oral)    Resp (!) 22    LMP 07/11/2014    SpO2 99%   Physical Exam Constitutional:      General: She is not in acute distress.    Appearance: She is obese. She is not ill-appearing.  HENT:     Head: Normocephalic and atraumatic.  Cardiovascular:     Rate and Rhythm: Normal rate and regular rhythm.     Heart sounds: No murmur.     Comments: Distant heart sounds Pulmonary:     Effort: Pulmonary effort is normal. No respiratory distress.     Breath sounds: Normal breath sounds. No wheezing, rhonchi or rales.     Comments: Limited due to body habitus and paraplegia Chest:     Chest wall: Tenderness (at mid sternum that reporoduces ) present.  Abdominal:     General: Bowel sounds are normal.     Palpations: Abdomen is soft.     Tenderness: There is no abdominal tenderness.  Musculoskeletal:     Right lower leg: No edema.     Left lower leg: No edema.     Comments: 0/5 strength BLE   Skin:    General: Skin is warm and dry.  Neurological:     Mental Status: She is alert.     Comments: Stable lack of sensation from umbilicus down      ED Treatments / Results  Labs (all labs ordered are listed, but only abnormal results are displayed) Labs Reviewed  COMPREHENSIVE METABOLIC PANEL - Abnormal; Notable for the following components:      Result Value   Glucose, Bld 100 (*)    Calcium 12.1 (*)    Albumin 3.2 (*)    All other components within normal limits  URINALYSIS, ROUTINE W REFLEX MICROSCOPIC - Abnormal; Notable for the following components:   APPearance CLOUDY (*)  All other  components within normal limits  CBC WITH DIFFERENTIAL/PLATELET - Abnormal; Notable for the following components:   Platelets 146 (*)    All other components within normal limits  CBG MONITORING, ED  I-STAT BETA HCG BLOOD, ED (MC, WL, AP ONLY)  TROPONIN I (HIGH SENSITIVITY)  TROPONIN I (HIGH SENSITIVITY)    EKG EKG Interpretation  Date/Time:  Sunday September 28 2018 09:17:48 EDT Ventricular Rate:  67 PR Interval:    QRS Duration: 88 QT Interval:  344 QTC Calculation: 364 R Axis:   41 Text Interpretation:  Sinus rhythm No significant change since last tracing Confirmed by Blanchie Dessert [phone]) on 09/28/2018 9:29:37 AM   Radiology Dg Chest Portable 1 View  Result Date: 09/28/2018 CLINICAL DATA:  58 year old female with history of constant central chest pain since 8 p.m. yesterday evening. Worsening with movement. EXAM: PORTABLE CHEST 1 VIEW COMPARISON:  Chest x-ray 08/09/2018. FINDINGS: Lung volumes are normal. No consolidative airspace disease. No pleural effusions. No pneumothorax. No pulmonary nodule or mass noted. Pulmonary vasculature and the cardiomediastinal silhouette are within normal limits. IMPRESSION: No radiographic evidence of acute cardiopulmonary disease. Electronically Signed   By: Vinnie Langton M.D.   On: 09/28/2018 11:21    Procedures Procedures (including critical care time)  Medications Ordered in ED Medications  ketorolac (TORADOL) 30 MG/ML injection 30 mg (30 mg Intravenous Given 09/28/18 1022)     Initial Impression / Assessment and Plan / ED Course  I have reviewed the triage vital signs and the nursing notes.  Pertinent labs & imaging results that were available during my care of the patient were reviewed by me and considered in my medical decision making (see chart for details).  Ellen [name] is a 58 year old female with past medical history significant for T10 paraplegia, morbid obesity, hypertension, hyperlipidemia who presents with substernal  nonradiating chest pain that is reproducible to palpation.  No associated symptoms.  Pain is consistent with previous episode of musculoskeletal chest pain that was worked up in June 2020.  Patient's EKG without acute changes from previous.  No history of DVT, PE.  Patient at low risk of PE, Wells score 1.5 for history of paraplegia, with similar pain patient had a negative CTA for pulmonary embolism in June 2020, therefore would not repeat at this time.  Will obtain CMP, CBC, troponin, chest x-ray to evaluate for ACS.  Given that highest on differential is musculoskeletal, will give patient Toradol to see if improvement with pain.  Also obtain UA to assess for UTI.  Reassured the patient has not had any fevers, blood pressure currently stable, heart rate within normal limits, no evidence of sepsis. Care management consulted for Home Health needs.  CMP without acute changes or abnormalities, CBC with mild decrease in platelets to 146.  Initial high-sensitivity troponin II.  UA negative for UTI.  Chest x-ray negative for acute cardiopulmonary disease. She notes improvement in her pain with toradol, but continues to state, "I have some, but it's working on me."  Repeat troponin negative.  Very low suspicion for ACS in the setting of no acute changes on EKG and 2- high-sensitivity troponins.  Pain likely musculoskeletal in origin.  Patient's pain has improved per her report.  Vital signs remained stable.  Care management requested order for home health, orders placed.  Social work order also placed to assist with possible placement in future for patient.  Discussed results and plan with patient, she agreed.  Patient will be discharged home.  Final Clinical Impressions(s) / ED Diagnoses   Final diagnoses:  Chest wall pain    ED Discharge Orders    None       Cleophas Dunker, DO 09/28/18 1307    Blanchie Dessert, MD 09/28/18 2120

## 2018-09-28 NOTE — ED Notes (Signed)
Patient verbalizes understanding of discharge instructions. Opportunity for questioning and answers were provided. Armband removed by staff, pt discharged from ED.  

## 2018-09-28 NOTE — ED Triage Notes (Signed)
Patient in via [facility] from home c/o constant, central chest pain since 8pm last night. She describes it as pressure and worse with movement and had it once before in which she was admitted for observation afterward, but results were unremarkable per patient. She denies shortness of breath, dizziness, N/V. Resp e/u, skin w/d. Patient took 324 ASA prior to EMS arrival.  EMS VS: 128/64, HR 74 NSR, RR 16, 98% RA. Temp 97.9 tympanic.  Patient adds that she feels like she may have a urinary tract infection. Per EMS, patient's house was in disarray and patient was lying in soiled bed that was concaved in. She is paralyzed from waist down and states that she had a home health aide that would come out once a week but she hasn't seen her in two weeks. She lives with her fiance who also helps with ADLs, but patient states she needs more home.

## 2018-09-28 NOTE — ED Notes (Signed)
[facility] called for pt transport. 

## 2018-09-28 NOTE — ED Notes (Signed)
Tech and nurse provided peri care and incontinence care after EMS brought pt in. Pt brief was around 5Ibs. Pt urine was very foul smelling and hazy looking. Urine culture sent down with UA specimen.

## 2019-01-26 ENCOUNTER — Inpatient Hospital Stay (HOSPITAL_COMMUNITY)
Admission: EM | Admit: 2019-01-26 | Discharge: 2019-01-31 | DRG: 690 | Disposition: A | Discharge status: Home / Self Care | Payer: Medicare HMO | Attending: Student in an Organized Health Care Education/Training Program | Admitting: Student in an Organized Health Care Education/Training Program

## 2019-01-26 ENCOUNTER — Other Ambulatory Visit: Payer: Self-pay

## 2019-01-26 ENCOUNTER — Emergency Department (HOSPITAL_COMMUNITY): Payer: Medicare HMO

## 2019-01-26 ENCOUNTER — Encounter (HOSPITAL_COMMUNITY): Payer: Self-pay

## 2019-01-26 DIAGNOSIS — Z885 Allergy status to narcotic agent status: Secondary | ICD-10-CM

## 2019-01-26 DIAGNOSIS — M479 Spondylosis, unspecified: Secondary | ICD-10-CM | POA: Diagnosis present

## 2019-01-26 DIAGNOSIS — M549 Dorsalgia, unspecified: Secondary | ICD-10-CM | POA: Diagnosis present

## 2019-01-26 DIAGNOSIS — Z833 Family history of diabetes mellitus: Secondary | ICD-10-CM

## 2019-01-26 DIAGNOSIS — Z20828 Contact with and (suspected) exposure to other viral communicable diseases: Secondary | ICD-10-CM | POA: Diagnosis present

## 2019-01-26 DIAGNOSIS — E7800 Pure hypercholesterolemia, unspecified: Secondary | ICD-10-CM | POA: Diagnosis present

## 2019-01-26 DIAGNOSIS — N12 Tubulo-interstitial nephritis, not specified as acute or chronic: Secondary | ICD-10-CM | POA: Diagnosis not present

## 2019-01-26 DIAGNOSIS — Z8249 Family history of ischemic heart disease and other diseases of the circulatory system: Secondary | ICD-10-CM

## 2019-01-26 DIAGNOSIS — N319 Neuromuscular dysfunction of bladder, unspecified: Secondary | ICD-10-CM | POA: Diagnosis present

## 2019-01-26 DIAGNOSIS — I10 Essential (primary) hypertension: Secondary | ICD-10-CM | POA: Diagnosis present

## 2019-01-26 DIAGNOSIS — N10 Acute tubulo-interstitial nephritis: Secondary | ICD-10-CM | POA: Diagnosis not present

## 2019-01-26 DIAGNOSIS — F329 Major depressive disorder, single episode, unspecified: Secondary | ICD-10-CM | POA: Diagnosis present

## 2019-01-26 DIAGNOSIS — Z6841 Body Mass Index (BMI) 40.0 and over, adult: Secondary | ICD-10-CM

## 2019-01-26 DIAGNOSIS — N390 Urinary tract infection, site not specified: Secondary | ICD-10-CM | POA: Diagnosis present

## 2019-01-26 DIAGNOSIS — A419 Sepsis, unspecified organism: Secondary | ICD-10-CM

## 2019-01-26 DIAGNOSIS — M170 Bilateral primary osteoarthritis of knee: Secondary | ICD-10-CM | POA: Diagnosis present

## 2019-01-26 DIAGNOSIS — E860 Dehydration: Secondary | ICD-10-CM | POA: Diagnosis present

## 2019-01-26 DIAGNOSIS — G8220 Paraplegia, unspecified: Secondary | ICD-10-CM | POA: Diagnosis present

## 2019-01-26 DIAGNOSIS — G4733 Obstructive sleep apnea (adult) (pediatric): Secondary | ICD-10-CM | POA: Diagnosis present

## 2019-01-26 DIAGNOSIS — E662 Morbid (severe) obesity with alveolar hypoventilation: Secondary | ICD-10-CM | POA: Diagnosis present

## 2019-01-26 DIAGNOSIS — M797 Fibromyalgia: Secondary | ICD-10-CM | POA: Diagnosis present

## 2019-01-26 DIAGNOSIS — Z809 Family history of malignant neoplasm, unspecified: Secondary | ICD-10-CM

## 2019-01-26 DIAGNOSIS — K219 Gastro-esophageal reflux disease without esophagitis: Secondary | ICD-10-CM | POA: Diagnosis present

## 2019-01-26 DIAGNOSIS — M4804 Spinal stenosis, thoracic region: Secondary | ICD-10-CM | POA: Diagnosis present

## 2019-01-26 LAB — URINALYSIS, ROUTINE W REFLEX MICROSCOPIC
Bacteria, UA: NONE SEEN
Bilirubin Urine: NEGATIVE
Glucose, UA: NEGATIVE mg/dL
Ketones, ur: NEGATIVE mg/dL
Nitrite: NEGATIVE
Protein, ur: 100 mg/dL — AB
Specific Gravity, Urine: 1.012 (ref 1.005–1.030)
WBC, UA: >50 WBC/hpf — ABNORMAL HIGH (ref 0–5)
pH: 6 (ref 5.0–8.0)

## 2019-01-26 LAB — CBC WITH DIFFERENTIAL/PLATELET
Abs Immature Granulocytes: 0.08 10*3/uL — ABNORMAL HIGH (ref 0.00–0.07)
Basophils Absolute: 0 10*3/uL (ref 0.0–0.1)
Basophils Relative: 0 %
Eosinophils Absolute: 0.1 10*3/uL (ref 0.0–0.5)
Eosinophils Relative: 1 %
HCT: 33.6 % — ABNORMAL LOW (ref 36.0–46.0)
Hemoglobin: 10.7 g/dL — ABNORMAL LOW (ref 12.0–15.0)
Immature Granulocytes: 1 %
Lymphocytes Relative: 8 %
Lymphs Abs: 1 10*3/uL (ref 0.7–4.0)
MCH: 31.3 pg (ref 26.0–34.0)
MCHC: 31.8 g/dL (ref 30.0–36.0)
MCV: 98.2 fL (ref 80.0–100.0)
Monocytes Absolute: 1.4 10*3/uL — ABNORMAL HIGH (ref 0.1–1.0)
Monocytes Relative: 11 %
Neutro Abs: 9.7 10*3/uL — ABNORMAL HIGH (ref 1.7–7.7)
Neutrophils Relative %: 79 %
Platelets: 388 10*3/uL (ref 150–400)
RBC: 3.42 MIL/uL — ABNORMAL LOW (ref 3.87–5.11)
RDW: 11.9 % (ref 11.5–15.5)
WBC: 12.3 10*3/uL — ABNORMAL HIGH (ref 4.0–10.5)
nRBC: 0 % (ref 0.0–0.2)

## 2019-01-26 LAB — PROTIME-INR
INR: 1.2 (ref 0.8–1.2)
Prothrombin Time: 14.8 seconds (ref 11.4–15.2)

## 2019-01-26 LAB — COMPREHENSIVE METABOLIC PANEL
ALT: 12 U/L (ref 0–44)
AST: 14 U/L — ABNORMAL LOW (ref 15–41)
Albumin: 2.9 g/dL — ABNORMAL LOW (ref 3.5–5.0)
Alkaline Phosphatase: 97 U/L (ref 38–126)
Anion gap: 10 (ref 5–15)
BUN: 15 mg/dL (ref 6–20)
CO2: 25 mmol/L (ref 22–32)
Calcium: 10.3 mg/dL (ref 8.9–10.3)
Chloride: 103 mmol/L (ref 98–111)
Creatinine, Ser: 0.68 mg/dL (ref 0.44–1.00)
GFR calc Af Amer: >60 mL/min (ref >60)
GFR calc non Af Amer: >60 mL/min (ref >60)
Glucose, Bld: 116 mg/dL — ABNORMAL HIGH (ref 70–99)
Potassium: 3.9 mmol/L (ref 3.5–5.1)
Sodium: 138 mmol/L (ref 135–145)
Total Bilirubin: 0.6 mg/dL (ref 0.3–1.2)
Total Protein: 6.9 g/dL (ref 6.5–8.1)

## 2019-01-26 LAB — POC SARS CORONAVIRUS 2 AG -  ED: SARS Coronavirus 2 Ag: NEGATIVE

## 2019-01-26 LAB — APTT: aPTT: 30 seconds (ref 24–36)

## 2019-01-26 LAB — I-STAT BETA HCG BLOOD, ED (MC, WL, AP ONLY): I-stat hCG, quantitative: 19.9 m[IU]/mL — ABNORMAL HIGH (ref <5)

## 2019-01-26 LAB — LACTIC ACID, PLASMA: Lactic Acid, Venous: 1.3 mmol/L (ref 0.5–1.9)

## 2019-01-26 MED ORDER — SODIUM CHLORIDE 0.9 % IV SOLN
500.0000 mg | INTRAVENOUS | Status: DC
  Administered 2019-01-26: 500 mg via INTRAVENOUS
  Filled 2019-01-26: qty 500

## 2019-01-26 MED ORDER — ENOXAPARIN SODIUM 80 MG/0.8ML ~~LOC~~ SOLN
80.0000 mg | Freq: Every day | SUBCUTANEOUS | Status: DC
  Administered 2019-01-28 – 2019-01-31 (×4): 80 mg via SUBCUTANEOUS
  Filled 2019-01-26 (×5): qty 0.8

## 2019-01-26 MED ORDER — ONDANSETRON HCL 4 MG PO TABS: 4.0000 mg | ORAL_TABLET | Freq: Four times a day (QID) | ORAL | Status: DC | PRN

## 2019-01-26 MED ORDER — AMLODIPINE BESYLATE 5 MG PO TABS: 5.0000 mg | ORAL_TABLET | Freq: Every day | ORAL | Status: DC

## 2019-01-26 MED ORDER — ACETAMINOPHEN 325 MG PO TABS
650.0000 mg | ORAL_TABLET | Freq: Four times a day (QID) | ORAL | Status: DC | PRN
  Administered 2019-01-27 – 2019-01-29 (×8): 650 mg via ORAL
  Filled 2019-01-26 (×8): qty 2

## 2019-01-26 MED ORDER — IPRATROPIUM BROMIDE 0.02 % IN SOLN: 0.5000 mg | Freq: Four times a day (QID) | RESPIRATORY_TRACT | Status: DC | PRN

## 2019-01-26 MED ORDER — FERROUS SULFATE 325 (65 FE) MG PO TABS: 325.0000 mg | ORAL_TABLET | Freq: Every day | ORAL | Status: DC

## 2019-01-26 MED ORDER — SODIUM CHLORIDE 0.9% FLUSH
10.0000 mL | Freq: Two times a day (BID) | INTRAVENOUS | Status: DC
  Administered 2019-01-28 – 2019-01-31 (×7): 10 mL

## 2019-01-26 MED ORDER — SODIUM CHLORIDE 0.9 % IV BOLUS
1000.0000 mL | Freq: Once | INTRAVENOUS | Status: AC
  Administered 2019-01-26: 1000 mL via INTRAVENOUS

## 2019-01-26 MED ORDER — ACETAMINOPHEN 500 MG PO TABS
1000.0000 mg | ORAL_TABLET | Freq: Once | ORAL | Status: AC
  Administered 2019-01-26: 1000 mg via ORAL
  Filled 2019-01-26: qty 2

## 2019-01-26 MED ORDER — SODIUM CHLORIDE 0.9 % IV SOLN
2.0000 g | INTRAVENOUS | Status: DC
  Administered 2019-01-26 – 2019-01-30 (×5): 2 g via INTRAVENOUS
  Filled 2019-01-26: qty 2
  Filled 2019-01-26: qty 20
  Filled 2019-01-26 (×2): qty 2
  Filled 2019-01-26 (×2): qty 20

## 2019-01-26 MED ORDER — PREGABALIN 100 MG PO CAPS
100.0000 mg | ORAL_CAPSULE | Freq: Three times a day (TID) | ORAL | Status: DC
  Administered 2019-01-27 – 2019-01-31 (×14): 100 mg via ORAL
  Filled 2019-01-26 (×13): qty 1
  Filled 2019-01-26: qty 2

## 2019-01-26 MED ORDER — MEDROXYPROGESTERONE ACETATE 2.5 MG PO TABS
5.0000 mg | ORAL_TABLET | Freq: Every day | ORAL | Status: DC
  Administered 2019-01-27: 11:00:00 5 mg via ORAL
  Filled 2019-01-26: qty 2

## 2019-01-26 MED ORDER — ESCITALOPRAM OXALATE 10 MG PO TABS
10.0000 mg | ORAL_TABLET | Freq: Every day | ORAL | Status: DC
  Administered 2019-01-27 – 2019-01-31 (×5): 10 mg via ORAL
  Filled 2019-01-26 (×5): qty 1

## 2019-01-26 MED ORDER — SODIUM CHLORIDE 0.9% FLUSH: 10.0000 mL | INTRAVENOUS | Status: DC | PRN

## 2019-01-26 MED ORDER — ALBUTEROL SULFATE (2.5 MG/3ML) 0.083% IN NEBU: 2.5000 mg | INHALATION_SOLUTION | RESPIRATORY_TRACT | Status: DC | PRN

## 2019-01-26 MED ORDER — ASPIRIN 81 MG PO CHEW
81.0000 mg | CHEWABLE_TABLET | Freq: Every day | ORAL | Status: DC
  Administered 2019-01-27 – 2019-01-31 (×5): 81 mg via ORAL
  Filled 2019-01-26 (×5): qty 1

## 2019-01-26 MED ORDER — ACETAMINOPHEN 650 MG RE SUPP: 650.0000 mg | Freq: Four times a day (QID) | RECTAL | Status: DC | PRN

## 2019-01-26 MED ORDER — ONDANSETRON HCL 4 MG/2ML IJ SOLN: 4.0000 mg | Freq: Four times a day (QID) | INTRAMUSCULAR | Status: DC | PRN

## 2019-01-26 MED ORDER — ATORVASTATIN CALCIUM 10 MG PO TABS
20.0000 mg | ORAL_TABLET | Freq: Every day | ORAL | Status: DC
  Administered 2019-01-27 – 2019-01-30 (×4): 20 mg via ORAL
  Filled 2019-01-26 (×4): qty 2

## 2019-01-26 MED ORDER — PANTOPRAZOLE SODIUM 40 MG PO TBEC
40.0000 mg | DELAYED_RELEASE_TABLET | Freq: Every day | ORAL | Status: DC
  Administered 2019-01-27 – 2019-01-31 (×5): 40 mg via ORAL
  Filled 2019-01-26 (×5): qty 1

## 2019-01-26 MED ORDER — BACLOFEN 5 MG HALF TABLET: 10.0000 mg | ORAL_TABLET | Freq: Three times a day (TID) | ORAL | Status: DC | PRN

## 2019-01-26 NOTE — ED Triage Notes (Signed)
[facility] reports pt began having a cough yesterday and today she reported a fever and chills beginning at noon. Pt is paraplegic. Temp 99.7, tachy at 135, rr 30

## 2019-01-26 NOTE — ED Notes (Signed)
Unsuccessful IV attempt.

## 2019-01-26 NOTE — ED Notes (Signed)
Pt IV is positional. Attempted to move it and tape but both lines are showing occluded. The pt body habitus is such that her arm has folds and does not fully extend.  There is an IV consult in place.

## 2019-01-26 NOTE — ED Notes (Signed)
Fluids and antibiotics restarted in midline placed by IV team.

## 2019-01-26 NOTE — ED Provider Notes (Signed)
[facility] Provider Note   CSN: [id number] Arrival date & time: 01/26/19  1802     History   Chief Complaint Chief Complaint  Patient presents with  . Fever  . Chills    HPI Ellen [name] is a 58 y.o. female.     HPI  58 year old female presents with cough and fever.  She states the symptoms started yesterday.  Temp over 103.  Patient has also had dysuria and thinks she has a recurrent kidney infection.  She has a cough with no sputum, shortness of breath, headache, sore throat.  No obvious Covid contacts.  No significant abdominal pain or chest pain.  Has chronic back pain that seems worse today but is the same type of pain she has always had. No medicines today.  Past Medical History:  Diagnosis Date  . Bronchitis    hx of  . Degenerative joint disease   . Depression   . ENDOMETRIAL POLYP 01/08/2006   Dr. Agnes Lawrence.  2001:  D&C, benign;  07/14/10:  D&C, hysteroscopy, polypectomy and bx of mons pubis (peau d'orange)...... 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  Marland Kitchen GERD (gastroesophageal reflux disease)   . Hernia   . High cholesterol   . Hypertension   . Lipoma   . Obesity   . Sleep apnea    uses cpap    Patient Active Problem List   Diagnosis Date Noted  . Acute urinary retention 08/14/2018  . Acute lower UTI 08/14/2018  . Atypical chest pain 08/10/2018  . Paraplegia ([place]) 08/09/2018  . Chest pain 08/09/2018  . Back pain 06/17/2018  . OSA (obstructive sleep apnea) 06/17/2018  . Hypercalcemia 06/17/2018  . Anxiety 04/06/2017  . Acute respiratory failure with hypoxia ([place])   . Acute hypoxemic respiratory failure ([place]) 04/05/2017  . SIRS (systemic inflammatory response syndrome) ([place]) 04/05/2017  . Elevated troponin 04/05/2017  . Hyponatremia 04/05/2017  . Urgency of micturation 04/14/2015  . Dizziness 04/14/2015  . Neck pain 03/31/2015  . Abnormal uterine bleeding 08/29/2012  .  Incarcerated ventral hernia 09/11/2010  . COUGH 05/13/2009  . ALLERGIC RHINITIS, SEASONAL 05/26/2008  . SHOULDER PAIN, LEFT 08/11/2007  . PRURITUS, VAGINAL 09/30/2006  . BACK PAIN 08/05/2006  . 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 LIPOMA 01/08/2006  . Hyperlipidemia 01/08/2006  . Obesity, morbid, BMI 50 or higher ([place]) 01/08/2006  . DEPRESSION 01/08/2006  . Essential hypertension 01/08/2006  . BRONCHITIS NOS 01/08/2006  . GERD 01/08/2006  . HERNIA, VENTRAL 01/08/2006  . ENDOMETRIAL POLYP 01/08/2006  . DEGENERATIVE JOINT DISEASE 01/08/2006    Past Surgical History:  Procedure Laterality Date  . CHOLECYSTECTOMY    . DILATION AND CURETTAGE OF UTERUS    . LUMBAR LAMINECTOMY/DECOMPRESSION MICRODISCECTOMY N/A 06/19/2018   Procedure: THORACIC TEN-THORACIC ELEVEN  LAMINECTOMY;  Surgeon: Consuella Lose, MD;  Location: [facility];  Service: Neurosurgery;  Laterality: N/A;  posterior  . resection of lipoma    . UMBILICAL HERNIA REPAIR       OB History   No obstetric history on file.      Home Medications    Prior to Admission medications   Medication Sig Start Date End Date Taking? Authorizing Provider  albuterol (PROVENTIL HFA;VENTOLIN HFA) 108 (90 Base) MCG/ACT inhaler Inhale 2 puffs into the lungs every 4 (four) hours as needed for wheezing or shortness of breath. 11/29/15   Duffy Bruce, MD  amLODipine (NORVASC) 5 MG tablet Take 5 mg by mouth daily.  [provider]  aspirin (BL ADULT ASPIRIN LOW STRENGTH) 81 MG chewable tablet Chew 81 mg by mouth daily.      [provider]  atorvastatin (LIPITOR) 20 MG tablet Take 20 mg by mouth daily at 6 PM.  03/28/17   [provider]  baclofen (LIORESAL) 10 MG tablet Take 10 mg by mouth 3 (three) times daily as needed. 01/13/19   [provider]  celecoxib (CELEBREX) 100 MG capsule Take 100 mg by mouth 2 (two) times daily. 01/14/19   [provider]  diclofenac sodium (VOLTAREN) 1 % GEL Apply  2-4 g topically every 2 (two) hours as needed (for bilateral knee pain).    [provider]  docusate sodium (COLACE) 100 MG capsule Take 1 capsule (100 mg total) by mouth 2 (two) times daily. 06/27/18   Hosie Poisson, MD  enalapril (VASOTEC) 20 MG tablet Take 40 mg by mouth daily.     [provider]  escitalopram (LEXAPRO) 10 MG tablet Take 10 mg by mouth daily.    [provider]  ferrous sulfate 325 (65 FE) MG tablet Take 325 mg by mouth daily with breakfast.    [provider]  furosemide (LASIX) 20 MG tablet Take 20 mg by mouth daily. 01/20/19   [provider]  ipratropium (ATROVENT) 0.02 % nebulizer solution Take 2.5 mLs (0.5 mg total) by nebulization every 6 (six) hours as needed for wheezing or shortness of breath. 04/08/17   Sheikh, Omair Latif, DO  levalbuterol (XOPENEX) 0.63 MG/3ML nebulizer solution Take 3 mLs (0.63 mg total) by nebulization every 6 (six) hours as needed for wheezing or shortness of breath. 04/08/17   Raiford Noble Latif, DO  medroxyPROGESTERone (PROVERA) 5 MG tablet Take 5 mg by mouth daily.    [provider]  omeprazole (PRILOSEC) 20 MG capsule Take 20 mg by mouth daily before breakfast.    [provider]  oxybutynin (DITROPAN) 5 MG tablet Take 5 mg by mouth 2 (two) times daily. 01/18/19   [provider]  polyethylene glycol powder (GLYCOLAX/MIRALAX) 17 GM/SCOOP powder Take 17 g by mouth daily as needed for mild constipation.     [provider]  pregabalin (LYRICA) 100 MG capsule Take 100 mg by mouth 3 (three) times daily. 01/06/19   [provider]  senna (SENOKOT) 8.6 MG TABS tablet Take 1 tablet (8.6 mg total) by mouth 2 (two) times daily. 06/27/18   Hosie Poisson, MD  traMADol (ULTRAM) 50 MG tablet Take 50 mg by mouth every 6 (six) hours as needed (for pain).    [provider]  vitamin B-12 (CYANOCOBALAMIN) 1000 MCG tablet Take 1 tablet (1,000 mcg total) by mouth daily.  08/14/18   Dhungel, Flonnie Overman, MD    Family History Family History  Problem Relation Age of Onset  . Heart disease Mother   . Diabetes Mother   . Cancer Father     Social History Social History   Tobacco Use  . Smoking status: Never Smoker  . Smokeless tobacco: Never Used  Substance Use Topics  . Alcohol use: No    Alcohol/week: 0.0 standard drinks  . Drug use: No     Allergies   Codeine   Review of Systems Review of Systems  Constitutional: Positive for fever.  HENT: Positive for sore throat.   Respiratory: Positive for cough and shortness of breath.   Gastrointestinal: Negative for vomiting.  Genitourinary: Positive for dysuria.  Musculoskeletal: Positive for back pain.  Neurological:  Positive for headaches.  All other systems reviewed and are negative.    Physical Exam Updated Vital Signs BP (!) 122/51   Pulse (!) 117   Temp (!) 103 F (39.4 C) (Oral)   Resp (!) 24   Ht 5\' 4"  (1.626 m)   Wt (!) 165.6 kg   LMP 07/11/2014   SpO2 97%   BMI 62.65 kg/m   Physical Exam Vitals signs and nursing note reviewed.  Constitutional:      General: She is not in acute distress.    Appearance: She is well-developed. She is obese. She is not ill-appearing or diaphoretic.  HENT:     Head: Normocephalic and atraumatic.     Right Ear: External ear normal.     Left Ear: External ear normal.     Nose: Nose normal.  Eyes:     General:        Right eye: No discharge.        Left eye: No discharge.  Cardiovascular:     Rate and Rhythm: Regular rhythm. Tachycardia present.     Heart sounds: Normal heart sounds.  Pulmonary:     Effort: Pulmonary effort is normal.     Breath sounds: Normal breath sounds. No wheezing, rhonchi or rales.  Abdominal:     General: There is no distension.     Palpations: Abdomen is soft.     Tenderness: There is no abdominal tenderness.  Skin:    General: Skin is warm and dry.  Neurological:     Mental Status: She is alert.   Psychiatric:        Mood and Affect: Mood is not anxious.      ED Treatments / Results  Labs (all labs ordered are listed, but only abnormal results are displayed) Labs Reviewed  COMPREHENSIVE METABOLIC PANEL - Abnormal; Notable for the following components:      Result Value   Glucose, Bld 116 (*)    Albumin 2.9 (*)    AST 14 (*)    All other components within normal limits  CBC WITH DIFFERENTIAL/PLATELET - Abnormal; Notable for the following components:   WBC 12.3 (*)    RBC 3.42 (*)    Hemoglobin 10.7 (*)    HCT 33.6 (*)    Neutro Abs 9.7 (*)    Monocytes Absolute 1.4 (*)    Abs Immature Granulocytes 0.08 (*)    All other components within normal limits  URINALYSIS, ROUTINE W REFLEX MICROSCOPIC - Abnormal; Notable for the following components:   APPearance CLOUDY (*)    Hgb urine dipstick SMALL (*)    Protein, ur 100 (*)    Leukocytes,Ua LARGE (*)    WBC, UA >50 (*)    All other components within normal limits  I-STAT BETA HCG BLOOD, ED ([facility], [facility], [facility] ONLY) - Abnormal; Notable for the following components:   I-stat hCG, quantitative 19.9 (*)    All other components within normal limits  CULTURE, BLOOD (ROUTINE X 2)  CULTURE, BLOOD (ROUTINE X 2)  URINE CULTURE  RESPIRATORY PANEL BY RT PCR (FLU A&B, COVID)  LACTIC ACID, PLASMA  PROTIME-INR  APTT  POC SARS CORONAVIRUS 2 AG -  ED    EKG None  Radiology Dg Chest Port 1 View  Result Date: 01/26/2019 CLINICAL DATA:  Cough, fever, central chest pain since yesterday EXAM: PORTABLE CHEST 1 VIEW COMPARISON:  Radiograph September 28, 2018, CT August 09, 2018 FINDINGS: Imaging quality degraded by body habitus. No focal consolidative  opacity is seen. No pneumothorax or visible effusion. The cardiomediastinal contours are unremarkable. No acute osseous or soft tissue abnormality. IMPRESSION: Accounting for body habitus, the lungs are clear. Electronically Signed   By: Lovena Le M.D.   On: 01/26/2019 20:21    Procedures  Procedures (including critical care time)  Angiocath insertion Performed by: Ephraim Hamburger  Consent: Verbal consent obtained. Risks and benefits: risks, benefits and alternatives were discussed Time out: Immediately prior to procedure a "time out" was called to verify the correct patient, procedure, equipment, support staff and site/side marked as required.  Preparation: Patient was prepped and draped in the usual sterile fashion.  Vein Location: left basilic  Ultrasound Guided  Gauge: 20  Normal blood return and flush without difficulty Patient tolerance: Patient tolerated the procedure well with no immediate complications.    Medications Ordered in ED Medications  cefTRIAXone (ROCEPHIN) 2 g in sodium chloride 0.9 % 100 mL IVPB (2 g Intravenous New Bag/Given 01/26/19 2134)  azithromycin (ZITHROMAX) 500 mg in sodium chloride 0.9 % 250 mL IVPB (has no administration in time range)  sodium chloride flush (NS) 0.9 % injection 10-40 mL (has no administration in time range)  sodium chloride flush (NS) 0.9 % injection 10-40 mL (has no administration in time range)  sodium chloride 0.9 % bolus 1,000 mL (1,000 mLs Intravenous New Bag/Given 01/26/19 2136)  acetaminophen (TYLENOL) tablet 1,000 mg (1,000 mg Oral Given 01/26/19 2129)     Initial Impression / Assessment and Plan / ED Course  I have reviewed the triage vital signs and the nursing notes.  Pertinent labs & imaging results that were available during my care of the patient were reviewed by me and considered in my medical decision making (see chart for details).        Patient's urinalysis is concerning for UTI.  With her history, fever, tachycardia and elevated WBC, she has SIRS criteria though no severe or sepsis or septic shock.  Given IV fluids and IV Rocephin.  No obvious pneumonia seen.  Initial Covid testing is negative though she will need confirmatory testing, especially given her URI symptoms.  Otherwise, discussed  with internal medicine teaching service for admission for supportive care and IV antibiotics.  ELLISEN VIVIRITO was evaluated in Emergency Department on 01/26/2019 for the symptoms described in the history of present illness. She was evaluated in the context of the global COVID-19 pandemic, which necessitated consideration that the patient might be at risk for infection with the SARS-CoV-2 virus that causes COVID-19. Institutional protocols and algorithms that pertain to the evaluation of patients at risk for COVID-19 are in a state of rapid change based on information released by regulatory bodies including the CDC and federal and state organizations. These policies and algorithms were followed during the patient's care in the ED.   Final Clinical Impressions(s) / ED Diagnoses   Final diagnoses:  Pyelonephritis  Sepsis without acute organ dysfunction, due to unspecified organism [facility])    ED Discharge Orders    None       Sherwood Gambler, MD 01/26/19 2310

## 2019-01-26 NOTE — H&P (Addendum)
Date: 01/27/2019               Patient Name:  Ellen [name] MRN: [id number]  DOB: [birth year] Age / Sex: 58 y.o., female   PCP: Deborah Chalk, FNP         Medical Service: Internal Medicine Teaching Service         Attending Physician: Dr. Evette Doffing, Mallie Mussel, *    First Contact: Dr. Trilby Drummer Pager: [phone]  Second Contact: Dr. Marva Panda Pager: [phone]       After Hours (After 5p/  First Contact Pager: [phone]  weekends / holidays): Second Contact Pager: [phone]   Chief Complaint: fever  History of Present Illness:   Ellen [name] is a 58yo female with PMH OSA, multiple UTIs, morbid obesity, cholecystectomy, paraplegia, urinary incontinence, and GERD presenting with dysuria, fever, and chills for the past two days. 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 She wears dependz and has them changed about every two hours. She started lasix four days ago and has had increased frequency since that time and feels this has helped her lower extremity swelling. She denies hematuria. She states she freqently gets UTI's. She last took antibiotics one month ago and completed therapy. She endorses mild diffuse abdominal pain. She denies nausea or vomiting. She denies diarrhea or constipation.  She has had a mild nonproductive cough and shortness of breath since yesterday. This is at rest and increased with lying flat and worsens with exertion. She denies sore throat, recent sick contacts or travel. She endorses intermittent headaches.  She has superficial chest pain when she leans against her bed rail at home but otherwise does not have pain. She denies palpitations.  She has been using her cpap.    Social:   She lives in [place] with her sister and fiancee. Her fiance cooks for her, and they both help her around the  house. She has an aide comes in and helps her bathe.  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  She has never smoked, she does not drink.   Family History: Mom - HTN Sister - HTN, diabetes Sister - diabetes   Meds:  Amlodipine 5mg  qd Aspirin 81 mg qd lipitor 20 mg qd Baclofen 10 mg tid Enalapril 40 mg qd lexapro 10 mg qd Ferrous sulfate 325 mg qd prilosec 20 mg qd lyrica 100 mg tid Oxybutynin 5 mg bid Lasix 20 mg qd celebrex 100 mg every other day  Albuterol 2 puffs q4h prn   Allergies: Allergies as of 01/26/2019 - Review Complete 01/26/2019  Allergen Reaction Noted   Codeine Itching    Past Medical History:  Diagnosis Date   Bronchitis    hx of   Degenerative joint disease    Depression    ENDOMETRIAL POLYP 01/08/2006   Dr. Agnes Lawrence.  2001:  D&C, benign;  07/14/10:  D&C, hysteroscopy, polypectomy and bx of mons pubis (peau d'orange)...... 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   GERD (gastroesophageal reflux disease)    Hernia    High cholesterol    Hypertension    Lipoma    Obesity    Sleep apnea    uses cpap     Review of Systems: A complete ROS was negative except as per HPI.   Physical Exam: Blood pressure Marland Kitchen)  132/98, pulse (!) 119, temperature (!) 100.7 F (38.2 C), temperature source Oral, resp. rate (!) 23, height 5\' 4"  (1.626 m), weight (!) 165.6 kg, last menstrual period 07/11/2014, SpO2 100 %.  Constitution: NAD, supine in bed, morbidly obese  HEENT: [place]/AT Cardio: tachycardic, regular rhythm, no murmurs, rubs, gallops; +ankle edema Respiratory: distant breath sounds but CTA  Abdominal: mild RLQ tenderness, soft, non-distended, +BS, no CVA tenderness MSK: LLE strength 1/5, RLE 0/1, RUE & LUE strength 5/5  Neuro: alert and oriented, pleasant  Skin: c/d/i   EKG: pending  CXR: personally reviewed my interpretation is no consolidation or opacities.   UA: +21-50 RBCs, large leukocytes, WBCs, no  bacteria  Assessment & Plan by Problem: Principal Problem:   Pyelonephritis Active Problems:   OSA (obstructive sleep apnea)   Paraplegia (HCC)   Neurogenic bladder  Ellen [name] is a 58yo female with PMH OSA, multiple UTIs, morbid obesity, cholecystectomy, paraplegia, urinary incontinence, and GERD presenting with dysuria, fever, and chills for the past two days.  Neurogenic Bladder Complicated Urinary Tract Infection/Pyelonephritis Symptoms consistent with complicated cystitis with symptoms, fever, leukocytosis, and tachycardia. She received 1L NS in addition to CTX and azithromycin.   - continue ceftriaxone and start empiric vancomycin given previous culture results - urine and blood culture pending  - renal US pending - hold oxybutynin pending [place]  - hold lasix   Shortness of Breath Increased shortness of breath over the past day likely in the setting of acute infection with tachycardia with low reserve due to deconditioning and morbid obesity. Rapid covid negative, repeat Covid is pending. Chest xr normal. No signs of volume overload (echo 07/2018 with EF 60%) and is currently on room air.  - continue home atrovent prn - f/u ECG - PT/OT  OSA - CPAP  HTN Currently normotensive.  - continue amlodipine, enalapril   Depression - continue lexapro 10mg  Paraplegia - continue lyrica 100 mg tid IDA - hold iron in active infection   Diet: regular  VTE: lovenox IVF: none  Code: full   Dispo: Admit patient to Inpatient with expected length of stay greater than 2 midnights.  SignedMarty Heck, DO 01/27/2019, 5:27 AM  Pager: [phone]

## 2019-01-26 NOTE — ED Notes (Signed)
Admitting at bedside 

## 2019-01-27 ENCOUNTER — Observation Stay (HOSPITAL_COMMUNITY): Payer: Medicare HMO

## 2019-01-27 DIAGNOSIS — Z6841 Body Mass Index (BMI) 40.0 and over, adult: Secondary | ICD-10-CM | POA: Diagnosis not present

## 2019-01-27 DIAGNOSIS — Z8249 Family history of ischemic heart disease and other diseases of the circulatory system: Secondary | ICD-10-CM | POA: Diagnosis not present

## 2019-01-27 DIAGNOSIS — E662 Morbid (severe) obesity with alveolar hypoventilation: Secondary | ICD-10-CM | POA: Diagnosis present

## 2019-01-27 DIAGNOSIS — F329 Major depressive disorder, single episode, unspecified: Secondary | ICD-10-CM | POA: Diagnosis present

## 2019-01-27 DIAGNOSIS — N10 Acute tubulo-interstitial nephritis: Secondary | ICD-10-CM | POA: Diagnosis present

## 2019-01-27 DIAGNOSIS — Z885 Allergy status to narcotic agent status: Secondary | ICD-10-CM | POA: Diagnosis not present

## 2019-01-27 DIAGNOSIS — M797 Fibromyalgia: Secondary | ICD-10-CM | POA: Diagnosis present

## 2019-01-27 DIAGNOSIS — I10 Essential (primary) hypertension: Secondary | ICD-10-CM | POA: Diagnosis present

## 2019-01-27 DIAGNOSIS — N319 Neuromuscular dysfunction of bladder, unspecified: Secondary | ICD-10-CM | POA: Diagnosis present

## 2019-01-27 DIAGNOSIS — E7800 Pure hypercholesterolemia, unspecified: Secondary | ICD-10-CM | POA: Diagnosis present

## 2019-01-27 DIAGNOSIS — M170 Bilateral primary osteoarthritis of knee: Secondary | ICD-10-CM | POA: Diagnosis present

## 2019-01-27 DIAGNOSIS — N390 Urinary tract infection, site not specified: Secondary | ICD-10-CM | POA: Diagnosis present

## 2019-01-27 DIAGNOSIS — K219 Gastro-esophageal reflux disease without esophagitis: Secondary | ICD-10-CM | POA: Diagnosis present

## 2019-01-27 DIAGNOSIS — G8220 Paraplegia, unspecified: Secondary | ICD-10-CM | POA: Diagnosis present

## 2019-01-27 DIAGNOSIS — N12 Tubulo-interstitial nephritis, not specified as acute or chronic: Secondary | ICD-10-CM

## 2019-01-27 DIAGNOSIS — Z809 Family history of malignant neoplasm, unspecified: Secondary | ICD-10-CM | POA: Diagnosis not present

## 2019-01-27 DIAGNOSIS — M4804 Spinal stenosis, thoracic region: Secondary | ICD-10-CM | POA: Diagnosis present

## 2019-01-27 DIAGNOSIS — E860 Dehydration: Secondary | ICD-10-CM | POA: Diagnosis present

## 2019-01-27 DIAGNOSIS — Z833 Family history of diabetes mellitus: Secondary | ICD-10-CM | POA: Diagnosis not present

## 2019-01-27 DIAGNOSIS — M479 Spondylosis, unspecified: Secondary | ICD-10-CM | POA: Diagnosis present

## 2019-01-27 DIAGNOSIS — Z20828 Contact with and (suspected) exposure to other viral communicable diseases: Secondary | ICD-10-CM | POA: Diagnosis present

## 2019-01-27 LAB — CBC WITH DIFFERENTIAL/PLATELET
Abs Immature Granulocytes: 0.11 10*3/uL — ABNORMAL HIGH (ref 0.00–0.07)
Basophils Absolute: 0 10*3/uL (ref 0.0–0.1)
Basophils Relative: 0 %
Eosinophils Absolute: 0.1 10*3/uL (ref 0.0–0.5)
Eosinophils Relative: 0 %
HCT: 32.6 % — ABNORMAL LOW (ref 36.0–46.0)
Hemoglobin: 10.5 g/dL — ABNORMAL LOW (ref 12.0–15.0)
Immature Granulocytes: 1 %
Lymphocytes Relative: 10 %
Lymphs Abs: 1.5 10*3/uL (ref 0.7–4.0)
MCH: 31.4 pg (ref 26.0–34.0)
MCHC: 32.2 g/dL (ref 30.0–36.0)
MCV: 97.6 fL (ref 80.0–100.0)
Monocytes Absolute: 2 10*3/uL — ABNORMAL HIGH (ref 0.1–1.0)
Monocytes Relative: 14 %
Neutro Abs: 10.6 10*3/uL — ABNORMAL HIGH (ref 1.7–7.7)
Neutrophils Relative %: 75 %
Platelets: 319 10*3/uL (ref 150–400)
RBC: 3.34 MIL/uL — ABNORMAL LOW (ref 3.87–5.11)
RDW: 11.8 % (ref 11.5–15.5)
WBC: 14.3 10*3/uL — ABNORMAL HIGH (ref 4.0–10.5)
nRBC: 0 % (ref 0.0–0.2)

## 2019-01-27 LAB — COMPREHENSIVE METABOLIC PANEL
ALT: 9 U/L (ref 0–44)
AST: 23 U/L (ref 15–41)
Albumin: 2.8 g/dL — ABNORMAL LOW (ref 3.5–5.0)
Alkaline Phosphatase: 81 U/L (ref 38–126)
Anion gap: 7 (ref 5–15)
BUN: 16 mg/dL (ref 6–20)
CO2: 25 mmol/L (ref 22–32)
Calcium: 9.6 mg/dL (ref 8.9–10.3)
Chloride: 106 mmol/L (ref 98–111)
Creatinine, Ser: 0.76 mg/dL (ref 0.44–1.00)
GFR calc Af Amer: >60 mL/min (ref >60)
GFR calc non Af Amer: >60 mL/min (ref >60)
Glucose, Bld: 134 mg/dL — ABNORMAL HIGH (ref 70–99)
Potassium: 4.6 mmol/L (ref 3.5–5.1)
Sodium: 138 mmol/L (ref 135–145)
Total Bilirubin: 0.8 mg/dL (ref 0.3–1.2)
Total Protein: 6.7 g/dL (ref 6.5–8.1)

## 2019-01-27 LAB — MAGNESIUM: Magnesium: 1.6 mg/dL — ABNORMAL LOW (ref 1.7–2.4)

## 2019-01-27 LAB — RESPIRATORY PANEL BY RT PCR (FLU A&B, COVID)
Influenza A by PCR: NEGATIVE
Influenza B by PCR: NEGATIVE
SARS Coronavirus 2 by RT PCR: NEGATIVE

## 2019-01-27 MED ORDER — LACTATED RINGERS IV BOLUS
1000.0000 mL | Freq: Once | INTRAVENOUS | Status: AC
  Administered 2019-01-27: 1000 mL via INTRAVENOUS

## 2019-01-27 MED ORDER — ENALAPRIL MALEATE 20 MG PO TABS
40.0000 mg | ORAL_TABLET | Freq: Every day | ORAL | Status: DC
  Administered 2019-01-27: 10:00:00 40 mg via ORAL
  Filled 2019-01-27: qty 2

## 2019-01-27 MED ORDER — LACTATED RINGERS IV BOLUS: 1000.0000 mL | Freq: Once | INTRAVENOUS | Status: DC

## 2019-01-27 MED ORDER — LACTATED RINGERS IV SOLN
INTRAVENOUS | Status: AC
  Administered 2019-01-27: 12:00:00 via INTRAVENOUS

## 2019-01-27 MED ORDER — LACTATED RINGERS IV SOLN
INTRAVENOUS | Status: AC
  Administered 2019-01-27: 23:00:00 500 mL via INTRAVENOUS
  Administered 2019-01-28: 04:00:00 via INTRAVENOUS

## 2019-01-27 MED ORDER — BACLOFEN 5 MG HALF TABLET
10.0000 mg | ORAL_TABLET | Freq: Three times a day (TID) | ORAL | Status: DC
  Administered 2019-01-27 – 2019-01-31 (×13): 10 mg via ORAL
  Filled 2019-01-27 (×13): qty 2

## 2019-01-27 MED ORDER — VANCOMYCIN HCL 10 G IV SOLR
1250.0000 mg | Freq: Two times a day (BID) | INTRAVENOUS | Status: DC
  Filled 2019-01-27: qty 1250

## 2019-01-27 MED ORDER — LACTATED RINGERS IV BOLUS
500.0000 mL | Freq: Once | INTRAVENOUS | Status: AC
  Administered 2019-01-27: 23:00:00 500 mL via INTRAVENOUS

## 2019-01-27 MED ORDER — MAGNESIUM SULFATE 2 GM/50ML IV SOLN
2.0000 g | Freq: Once | INTRAVENOUS | Status: AC
  Administered 2019-01-27: 05:00:00 2 g via INTRAVENOUS
  Filled 2019-01-27: qty 50

## 2019-01-27 NOTE — Evaluation (Signed)
Physical Therapy Evaluation Patient Details Name: Ellen [name] MRN: [id number] DOB: 1960-10-14 Today's Date: 01/27/2019   History of Present Illness  Allaya D. Vendetti is a 58yo female with PMH OSA, multiple UTIs, morbid obesity, cholecystectomy, paraplegia, urinary incontinence, and GERD presenting with dysuria, fever, and chills for the past two days. 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  Clinical Impression  Patient presents with decreased mobility due to paraplegia, decreased UE strength, decreased balance and limited activity tolerance.  Feel she will benefit from skilled PT during acute stay to maximize mobility, aide with decreased burden of care and prevent secondary effects of prolonged bedrest.  She will need stretching for her LE's and assist for OOB.  Recommend resume HHPT at d/c.     Follow Up Recommendations Home health PT;Supervision/Assistance - 24 hour    Equipment Recommendations  None recommended by PT    Recommendations for Other Services       Precautions / Restrictions Precautions Precautions: Fall Precaution Comments: paraplegia      Mobility  Bed Mobility Overal bed mobility: Needs Assistance Bed Mobility: Rolling Rolling: Total assist;+2 for physical assistance         General bed mobility comments: rolled L and R in bed with +2 A for hygiene and linen change due to soiled with urine despite purewick; +3 A bed in trendelenberg to scoot up in bed  Transfers                 General transfer comment: dependent at home via hoyer  Ambulation/Gait                Stairs            Wheelchair Mobility    Modified Rankin (Stroke Patients Only)       Balance                                             Pertinent Vitals/Pain Pain  Assessment: 0-10 Pain Score: 8  Pain Location: legs and back Pain Descriptors / Indicators: Aching;Discomfort Pain Intervention(s): Monitored during session;Repositioned    Home Living Family/patient expects to be discharged to:: Private residence Living Arrangements: Spouse/significant other;Other relatives Available Help at Discharge: Family;Available 24 hours/day;Personal care attendant Type of Home: House       Home Layout: One level Home Equipment: Shower seat;Bedside commode;Wheelchair - manual;Hospital bed(hoyer lift)      Prior Function Level of Independence: Needs assistance   Gait / Transfers Assistance Needed: Pt reports she sits EOB with her home PT , uses lift to get to [facility],  ADL's / Homemaking Assistance Needed: bathes in the bed, has a HH aide, total care for cooking/cleaning.         Hand Dominance   Dominant Hand: Right    Extremity/Trunk Assessment   Upper Extremity Assessment Upper Extremity Assessment: Generalized weakness    Lower Extremity Assessment Lower Extremity Assessment: LLE deficits/detail;RLE deficits/detail RLE Deficits / Details: heavy legs with increased tone, some active movement on L LE more than R LE RLE Sensation: decreased light touch LLE Deficits / Details: heavy legs with increased tone, some active movement on L LE more than R LE LLE Sensation: decreased light touch  Communication   Communication: No difficulties  Cognition Arousal/Alertness: Awake/alert Behavior During Therapy: WFL for tasks assessed/performed Overall Cognitive Status: Within Functional Limits for tasks assessed                                        General Comments General comments (skin integrity, edema, etc.): soiled with urine and feces in bed, cleansed skin and no noted breakdown with cursory look    Exercises Other Exercises Other Exercises: AAROM/PROM in bed for rolling   Assessment/Plan    PT Assessment Patient needs  continued PT services  PT Problem List Decreased strength;Decreased mobility;Decreased balance;Decreased range of motion;Impaired tone       PT Treatment Interventions Therapeutic activities;Balance training;Patient/family education;Therapeutic exercise;Functional mobility training    PT Goals (Current goals can be found in the Care Plan section)  Acute Rehab PT Goals Patient Stated Goal: to return home PT Goal Formulation: With patient Time For Goal Achievement: 02/10/19 Potential to Achieve Goals: Fair    Frequency Min 2X/week   Barriers to discharge        Co-evaluation               AM-PAC PT "6 Clicks" Mobility  Outcome Measure Help needed turning from your back to your side while in a flat bed without using bedrails?: Total Help needed moving from lying on your back to sitting on the side of a flat bed without using bedrails?: Total Help needed moving to and from a bed to a chair (including a wheelchair)?: Total Help needed standing up from a chair using your arms (e.g., wheelchair or bedside chair)?: Total Help needed to walk in hospital room?: Total Help needed climbing 3-5 steps with a railing? : Total 6 Click Score: 6    End of Session   Activity Tolerance: Patient tolerated treatment well Patient left: in bed;with call bell/phone within reach Nurse Communication: Mobility status;Need for lift equipment PT Visit Diagnosis: Other abnormalities of gait and mobility (R26.89);Muscle weakness (generalized) (M62.81)    Time: [id number] PT Time Calculation (min) (ACUTE ONLY): 28 min   Charges:   PT Evaluation $PT Eval Moderate Complexity: 1 Mod PT Treatments $Therapeutic Activity: 8-22 mins        Magda Kiel, [place] Acute [facility] Services [phone] 01/27/2019   Reginia Naas 01/27/2019, 10:54 AM

## 2019-01-27 NOTE — ED Notes (Signed)
Secretary paging Admitting MD so may advise of pt's temp 102.9 after Tylenol and HR remains elevated.

## 2019-01-27 NOTE — ED Notes (Addendum)
   01/27/19 2003  Vitals  Temp (!) 102.9 F (39.4 C)  Temp Source Oral  BP (!) 124/52  MAP (mmHg) 67  BP Location Right Wrist  BP Method Automatic  Patient Position (if appropriate) Sitting  Pulse Rate (!) 125  Pulse Rate Source Dinamap  Resp (!) 24    Question Monique ED nurse of new admit mews score of 5 ED nurse will call doctor and call me back.

## 2019-01-27 NOTE — ED Notes (Addendum)
RN spoke with Internal Medicine MD; Bed placement notified and new orders placed-Monique,RN

## 2019-01-27 NOTE — ED Notes (Signed)
5M is declining patient due to recent vitals; Admitting paged-Monique,RN

## 2019-01-27 NOTE — ED Notes (Signed)
RN came in the room and patient had blood soaked gown from pulling out her IV; Pt was unaware that she pulled out her IV; patient changed to new gown and bleeding has stopped from sight-Monique,RN

## 2019-01-27 NOTE — ED Notes (Signed)
MS   Breakfast ordered  

## 2019-01-27 NOTE — Progress Notes (Addendum)
Subjective: No events overnight. This morning, patient reports that her abdominal pain is better. She does endorse some neck pain she thinks is from sleeping in an uncomfortable position. Pt reports that she can move her feet, she is just not able to walk. She also endorsing having spasms during the encounter. Pt complains of a dry mouth and asks for two cups of water.   Objective:  Vital signs in last 24 hours: Vitals:   01/27/19 0504 01/27/19 0515 01/27/19 0545 01/27/19 0615  BP:  (!) 132/98 136/80 (!) 133/98  Pulse:  (!) 119 (!) 109 (!) 109  Resp:  (!) 23 (!) 22 (!) 26  Temp: (!) 100.7 F (38.2 C)     TempSrc: Oral     SpO2:  100% 100% 100%  Weight:      Height:       Weight change:   Intake/Output Summary (Last 24 hours) at 01/27/2019 0645 Last data filed at 01/27/2019 0203 Gross per 24 hour  Intake 1250 ml  Output   Net 1250 ml   Physical Exam Constitutional:      Appearance: She is obese.  HENT:     Mouth/Throat:     Mouth: Mucous membranes are dry.     Comments: Mucous membranes are dry Cardiovascular:     Rate and Rhythm: Regular rhythm. Tachycardia present.     Heart sounds: Normal heart sounds.     Comments: Unable to detect pulses in lower extremities due to edema. Pulmonary:     Comments: Breathing with increased effort. Musculoskeletal:     Right lower leg: Edema present.     Left lower leg: Edema present.     Comments: Bilateral edema of the lower extremities.  Skin:    General: Skin is warm and dry.     Capillary Refill: Capillary refill takes less than 2 seconds.  Neurological:     General: No focal deficit present.     Mental Status: She is alert.  Psychiatric:        Mood and Affect: Mood normal.        Behavior: Behavior normal.    Labs: Mg 1.6, Alk phos 2.8 Hgb 10.5, Hct 32.6  BMP Latest Ref Rng & Units 01/27/2019 01/26/2019 09/28/2018  Glucose 70 - 99 mg/dL 134(H) 116(H) 100(H)  BUN 6 - 20 mg/dL _0 Creatinine 0.44 - 1.00 mg/dL  0.76 0.68 0.57  Sodium 135 - 145 mmol/L 138 138 139  Potassium 3.5 - 5.1 mmol/L 4.6 3.9 4.3  Chloride 98 - 111 mmol/L 106 103 105  CO2 22 - 32 mmol/L _1 Calcium 8.9 - 10.3 mg/dL 9.6 10.3 12.1(H)   CBC Latest Ref Rng & Units 01/27/2019 01/26/2019 09/28/2018  WBC 4.0 - 10.5 K/uL 14.3(H) 12.3(H) 6.4  Hemoglobin 12.0 - 15.0 g/dL 10.5(L) 10.7(L) 12.8  Hematocrit 36.0 - 46.0 % 32.6(L) 33.6(L) 39.6  Platelets 150 - 400 K/uL 319 388 146(L)    Assessment/Plan:  Ellen [name] is a 58yo female with PMH of multiple UTIs, urinary incontinence, OSA and morbid obesity presenting with dysuria, fever, and chills for the past two days.   Neurogenic Bladder:  Pyelonephritis: Etiology is most likely pyelonephritis based on urinary symptoms and fever. Patient with neurogenic bladder over past several months with prior admission for pyelonephritis. She previously required a Foley catheter placement for two weeks. She has had dysuria, fevers and chills for two days. Tmax 103 on admission and with neutrophil predominant  leukocytosis. On examination, no significant CVA tenderness. Her urinary symptoms in conjunction with systemic signs of infection consistent with pyelonephritis. Prior urine culture with Morganella sensitive to ceftriaxone. Patient started on empiric ceftriaxone.  - Continue IV Ceftriaxone 2 g daily  - Urine and blood culture pending   Shortness of Breath:  Patient reports increasing SOB, especially when she has a muscle spasm. Chest x-ray normal. No signs of volume overload (echo 07/2018 with EF 60%) and is currently on room air. Rapid covid and influenza negative. Her SOB is likely secondary to acute infection and deconditioning with morbid obesity.  - continue home atrovent prn - PT/OT evaluation    OSA Patient with history of OSA on CPAP at home.  - Continue home CPAP   LOS: 0 days   Ellen [name], Medical Student 01/27/2019, 6:45 AM   Attestation for Student  Documentation:  I personally was present and performed or re-performed the history, physical exam and medical decision-making activities of this service and have verified that the service and findings are accurately documented in the student's note.  Harvie Heck, MD  Internal Medicine, PGY-1 01/27/2019, 3:44 PM

## 2019-01-27 NOTE — Progress Notes (Signed)
Pharmacy Antibiotic Note  Ellen [name] is a 58 y.o. female admitted on [id number] with complicated UTI.  Pharmacy has been consulted for Vancomycin  dosing.  Plan: Vancomycin 2500 mg IV now, then Vancomycin 1250 MG IV q12h Est AUC 490  Height: 5\' 4"  (162.6 cm) Weight: (!) 365 lb (165.6 kg) IBW/kg (Calculated) : 54.7  Temp (24hrs), Avg:101 F (38.3 C), Min:99.6 F (37.6 C), Max:103 F (39.4 C)  Recent Labs  Lab 01/26/19 1921 01/27/19 0314  WBC 12.3* 14.3*  CREATININE 0.68 0.76  LATICACIDVEN 1.3  --     Estimated Creatinine Clearance: 118.5 mL/min (by C-G formula based on SCr of 0.76 mg/dL).    Allergies  Allergen Reactions  . Codeine Itching    Only happened one time and did not occur last time patient was in the hospital. (??)    Laterrance Nauta, Bronson Curb 01/27/2019 5:16 AM

## 2019-01-27 NOTE — ED Notes (Signed)
Lunch Tray Ordered @ 1042. 

## 2019-01-27 NOTE — ED Notes (Signed)
RN attempted to call report; will call back-Monique,RN

## 2019-01-27 NOTE — ED Notes (Addendum)
ED TO INPATIENT HANDOFF REPORT  ED Nurse Name and Phone #: Gwyndolyn Saxon [phone]  S Name/Age/Gender Ellen [name] 58 y.o. female Room/Bed: 052C/052C  Code Status   Code Status: Full Code  Home/SNF/Other Home Patient oriented to: self, place, time and situation Is this baseline? Yes   Triage Complete: Triage complete  Chief Complaint Fever; Chills  Triage Note [facility] reports pt began having a cough yesterday and today she reported a fever and chills beginning at noon. Pt is paraplegic. Temp 99.7, tachy at 135, rr 30     Allergies Allergies  Allergen Reactions  . Codeine Itching    Only happened one time and did not occur last time patient was in the hospital. (??)    Level of Care/Admitting Diagnosis ED Disposition    ED Disposition Condition [place]: [facility] [[id number]]  Level of Care: Med-Surg [16]  Covid Evaluation: Confirmed COVID Negative  Diagnosis: Pyelonephritis [[id number]]  Admitting Physician: Axel Filler [[id number]]  Attending Physician: Axel Filler [[id number]]  Estimated length of stay: past midnight tomorrow  Certification:: I certify this patient will need inpatient services for at least 2 midnights  PT Class (Do Not Modify): Inpatient [101]  PT Acc Code (Do Not Modify): Private [1]       B Medical/Surgery History Past Medical History:  Diagnosis Date  . Bronchitis    hx of  . Degenerative joint disease   . Depression   . ENDOMETRIAL POLYP 01/08/2006   Dr. Agnes Lawrence.  2001:  D&C, benign;  07/14/10:  D&C, hysteroscopy, polypectomy and bx of mons pubis (peau d'orange)...... 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  Marland Kitchen GERD (gastroesophageal reflux disease)   . Hernia   . High cholesterol   . Hypertension   . Lipoma   . Obesity   . Sleep apnea    uses cpap   Past Surgical History:  Procedure Laterality Date  . CHOLECYSTECTOMY    . DILATION AND  CURETTAGE OF UTERUS    . LUMBAR LAMINECTOMY/DECOMPRESSION MICRODISCECTOMY N/A 06/19/2018   Procedure: THORACIC TEN-THORACIC ELEVEN  LAMINECTOMY;  Surgeon: Consuella Lose, MD;  Location: [facility];  Service: Neurosurgery;  Laterality: N/A;  posterior  . resection of lipoma    . UMBILICAL HERNIA REPAIR       A IV Location/Drains/Wounds Patient Lines/Drains/Airways Status   Active Line/Drains/Airways    Name:   Placement date:   Placement time:   Site:   Days:   Peripheral IV 01/26/19 Left Antecubital   01/26/19    2122    Antecubital   1   Midline Single Lumen 01/26/19 Midline Left Cephalic 8 cm 0 cm   25/63/89    3734    Cephalic   1   External Urinary Catheter   01/26/19    1926    -   1   Incision (Closed) 06/19/18 Back Other (Comment)   06/19/18    1026     222   Pressure Injury 05/30/18 Stage I -  Intact skin with non-blanchable redness of a localized area usually over a bony prominence.   05/30/18    1502     242          Intake/Output Last 24 hours  Intake/Output Summary (Last 24 hours) at 01/27/2019 2015 Last data filed at 01/27/2019 1954 Gross per 24 hour  Intake 2964 ml  Output 1000 ml  Net 1964 ml    Labs/Imaging Results  for orders placed or performed during the hospital encounter of 01/26/19 (from the past 48 hour(s))  Blood Culture (routine x 2)     Status: None (Preliminary result)   Collection Time: 01/26/19  6:21 PM   Specimen: Site Not Specified; Blood  Result Value Ref Range   Specimen Description SITE NOT SPECIFIED    Special Requests      BOTTLES DRAWN AEROBIC AND ANAEROBIC Blood Culture adequate volume   Culture      NO GROWTH < 24 HOURS Performed at [facility] [address] [place], [place] [address]    Report Status PENDING   Lactic acid, plasma     Status: None   Collection Time: 01/26/19  7:21 PM  Result Value Ref Range   Lactic Acid, Venous 1.3 0.5 - 1.9 mmol/L    Comment: Performed at [facility], [place] [address]  [place], [place] [address]  Comprehensive metabolic panel     Status: Abnormal   Collection Time: 01/26/19  7:21 PM  Result Value Ref Range   Sodium 138 135 - 145 mmol/L   Potassium 3.9 3.5 - 5.1 mmol/L   Chloride 103 98 - 111 mmol/L   CO2 25 22 - 32 mmol/L   Glucose, Bld 116 (H) 70 - 99 mg/dL   BUN 15 6 - 20 mg/dL   Creatinine, Ser 0.68 0.44 - 1.00 mg/dL   Calcium 10.3 8.9 - 10.3 mg/dL   Total Protein 6.9 6.5 - 8.1 g/dL   Albumin 2.9 (L) 3.5 - 5.0 g/dL   AST 14 (L) 15 - 41 U/L   ALT 12 0 - 44 U/L   Alkaline Phosphatase 97 38 - 126 U/L   Total Bilirubin 0.6 0.3 - 1.2 mg/dL   GFR calc non Af Amer >60 >60 mL/min   GFR calc Af Amer >60 >60 mL/min   Anion gap 10 5 - 15    Comment: Performed at [facility] [address], [place], [place] [address]  CBC WITH DIFFERENTIAL     Status: Abnormal   Collection Time: 01/26/19  7:21 PM  Result Value Ref Range   WBC 12.3 (H) 4.0 - 10.5 K/uL   RBC 3.42 (L) 3.87 - 5.11 MIL/uL   Hemoglobin 10.7 (L) 12.0 - 15.0 g/dL   HCT 33.6 (L) 36.0 - 46.0 %   MCV 98.2 80.0 - 100.0 fL   MCH 31.3 26.0 - 34.0 pg   MCHC 31.8 30.0 - 36.0 g/dL   RDW 11.9 11.5 - 15.5 %   Platelets 388 150 - 400 K/uL   nRBC 0.0 0.0 - 0.2 %   Neutrophils Relative % 79 %   Neutro Abs 9.7 (H) 1.7 - 7.7 K/uL   Lymphocytes Relative 8 %   Lymphs Abs 1.0 0.7 - 4.0 K/uL   Monocytes Relative 11 %   Monocytes Absolute 1.4 (H) 0.1 - 1.0 K/uL   Eosinophils Relative 1 %   Eosinophils Absolute 0.1 0.0 - 0.5 K/uL   Basophils Relative 0 %   Basophils Absolute 0.0 0.0 - 0.1 K/uL   Immature Granulocytes 1 %   Abs Immature Granulocytes 0.08 (H) 0.00 - 0.07 K/uL    Comment: Performed at [facility], [address] [place], [place] [address]  I-Stat beta hCG blood, ED     Status: Abnormal   Collection Time: 01/26/19  7:45 PM  Result Value Ref Range   I-stat hCG, quantitative 19.9 (H) <5 mIU/mL  Comment 3            Comment:   GEST. AGE      CONC.  (mIU/mL)   <=1 WEEK        5 -  50     2 WEEKS       50 - 500     3 WEEKS       100 - 10,000     4 WEEKS     1,000 - 30,000        FEMALE AND NON-PREGNANT FEMALE:     LESS THAN 5 mIU/mL   Protime-INR     Status: None   Collection Time: 01/26/19  9:00 PM  Result Value Ref Range   Prothrombin Time 14.8 11.4 - 15.2 seconds   INR 1.2 0.8 - 1.2    Comment: (NOTE) INR goal varies based on device and disease states. Performed at [facility], [place] [address] [place], [place] [address]   APTT     Status: None   Collection Time: 01/26/19  9:00 PM  Result Value Ref Range   aPTT 30 24 - 36 seconds    Comment: Performed at [facility] [address], [place], [place] [address]  Urinalysis, Routine w reflex microscopic     Status: Abnormal   Collection Time: 01/26/19  9:11 PM  Result Value Ref Range   Color, Urine YELLOW YELLOW   APPearance CLOUDY (A) CLEAR   Specific Gravity, Urine 1.012 1.005 - 1.030   pH 6.0 5.0 - 8.0   Glucose, UA NEGATIVE NEGATIVE mg/dL   Hgb urine dipstick SMALL (A) NEGATIVE   Bilirubin Urine NEGATIVE NEGATIVE   Ketones, ur NEGATIVE NEGATIVE mg/dL   Protein, ur 100 (A) NEGATIVE mg/dL   Nitrite NEGATIVE NEGATIVE   Leukocytes,Ua LARGE (A) NEGATIVE   RBC / HPF 21-50 0 - 5 RBC/hpf   WBC, UA >50 (H) 0 - 5 WBC/hpf   Bacteria, UA NONE SEEN NONE SEEN   WBC Clumps PRESENT     Comment: Performed at [facility] [address], [place],  [address]  POC SARS Coronavirus 2 Ag-ED - Nasal Swab (BD Veritor Kit)     Status: None   Collection Time: 01/26/19  9:34 PM  Result Value Ref Range   SARS Coronavirus 2 Ag NEGATIVE NEGATIVE    Comment: (NOTE) SARS-CoV-2 antigen NOT DETECTED.  Negative results are presumptive.  Negative results do not preclude SARS-CoV-2 infection and should not be used as the sole basis for treatment or other patient management decisions, including infection  control decisions, particularly in the presence of clinical signs and  symptoms consistent with  COVID-19, or in those who have been in contact with the virus.  Negative results must be combined with clinical observations, patient history, and epidemiological information. The expected result is Negative. Fact Sheet for Patients: PodPark.tn Fact Sheet for Healthcare Providers: GiftContent.is This test is not yet approved or cleared by the [place] FDA and  has been authorized for detection and/or diagnosis of SARS-CoV-2 by FDA under an Emergency Use Authorization (EUA).  This EUA will remain in effect (meaning this test can be used) for the duration of  the COVID-19 de claration under Section 564(b)(1) of the Act, 21 U.S.C. section 360bbb-3(b)(1), unless the authorization is terminated or revoked sooner.   Respiratory Panel by RT PCR (Flu A&B, Covid) - Nasopharyngeal Swab     Status: None   Collection Time: 01/26/19 10:05  PM   Specimen: Nasopharyngeal Swab  Result Value Ref Range   SARS Coronavirus 2 by RT PCR NEGATIVE NEGATIVE    Comment: (NOTE) SARS-CoV-2 target nucleic acids are NOT DETECTED. The SARS-CoV-2 RNA is generally detectable in upper respiratoy specimens during the acute phase of infection. The lowest concentration of SARS-CoV-2 viral copies this assay can detect is 131 copies/mL. A negative result does not preclude SARS-Cov-2 infection and should not be used as the sole basis for treatment or other patient management decisions. A negative result may occur with  improper specimen collection/handling, submission of specimen other than nasopharyngeal swab, presence of viral mutation(s) within the areas targeted by this assay, and inadequate number of viral copies (<131 copies/mL). A negative result must be combined with clinical observations, patient history, and epidemiological information. The expected result is Negative. Fact Sheet for Patients:  PinkCheek.be Fact Sheet  for Healthcare Providers:  GravelBags.it This test is not yet ap proved or cleared by the [place] FDA and  has been authorized for detection and/or diagnosis of SARS-CoV-2 by FDA under an Emergency Use Authorization (EUA). This EUA will remain  in effect (meaning this test can be used) for the duration of the COVID-19 declaration under Section 564(b)(1) of the Act, 21 U.S.C. section 360bbb-3(b)(1), unless the authorization is terminated or revoked sooner.    Influenza A by PCR NEGATIVE NEGATIVE   Influenza B by PCR NEGATIVE NEGATIVE    Comment: (NOTE) The Xpert Xpress SARS-CoV-2/FLU/RSV assay is intended as an aid in  the diagnosis of influenza from Nasopharyngeal swab specimens and  should not be used as a sole basis for treatment. Nasal washings and  aspirates are unacceptable for Xpert Xpress SARS-CoV-2/FLU/RSV  testing. Fact Sheet for Patients: PinkCheek.be Fact Sheet for Healthcare Providers: GravelBags.it This test is not yet approved or cleared by the [place] FDA and  has been authorized for detection and/or diagnosis of SARS-CoV-2 by  FDA under an Emergency Use Authorization (EUA). This EUA will remain  in effect (meaning this test can be used) for the duration of the  Covid-19 declaration under Section 564(b)(1) of the Act, 21  U.S.C. section 360bbb-3(b)(1), unless the authorization is  terminated or revoked. Performed at [facility], [place] [address] [place], [place] [address]   Blood Culture (routine x 2)     Status: None (Preliminary result)   Collection Time: 01/26/19 10:28 PM   Specimen: BLOOD LEFT ARM  Result Value Ref Range   Specimen Description BLOOD LEFT ARM    Special Requests      BOTTLES DRAWN AEROBIC AND ANAEROBIC Blood Culture adequate volume   Culture      NO GROWTH < 24 HOURS Performed at [facility], [place] [address] [place], [place]  [address]    Report Status PENDING   Magnesium     Status: Abnormal   Collection Time: 01/27/19  3:14 AM  Result Value Ref Range   Magnesium 1.6 (L) 1.7 - 2.4 mg/dL    Comment: Performed at [facility] [address] [place], [place] [address]  CBC WITH DIFFERENTIAL     Status: Abnormal   Collection Time: 01/27/19  3:14 AM  Result Value Ref Range   WBC 14.3 (H) 4.0 - 10.5 K/uL   RBC 3.34 (L) 3.87 - 5.11 MIL/uL   Hemoglobin 10.5 (L) 12.0 - 15.0 g/dL   HCT 32.6 (L) 36.0 - 46.0 %   MCV 97.6 80.0 - 100.0 fL  MCH 31.4 26.0 - 34.0 pg   MCHC 32.2 30.0 - 36.0 g/dL   RDW 11.8 11.5 - 15.5 %   Platelets 319 150 - 400 K/uL   nRBC 0.0 0.0 - 0.2 %   Neutrophils Relative % 75 %   Neutro Abs 10.6 (H) 1.7 - 7.7 K/uL   Lymphocytes Relative 10 %   Lymphs Abs 1.5 0.7 - 4.0 K/uL   Monocytes Relative 14 %   Monocytes Absolute 2.0 (H) 0.1 - 1.0 K/uL   Eosinophils Relative 0 %   Eosinophils Absolute 0.1 0.0 - 0.5 K/uL   Basophils Relative 0 %   Basophils Absolute 0.0 0.0 - 0.1 K/uL   Immature Granulocytes 1 %   Abs Immature Granulocytes 0.11 (H) 0.00 - 0.07 K/uL    Comment: Performed at [facility] [address] [place], [place] [address]  Comprehensive metabolic panel     Status: Abnormal   Collection Time: 01/27/19  3:14 AM  Result Value Ref Range   Sodium 138 135 - 145 mmol/L   Potassium 4.6 3.5 - 5.1 mmol/L    Comment: SLIGHT HEMOLYSIS   Chloride 106 98 - 111 mmol/L   CO2 25 22 - 32 mmol/L   Glucose, Bld 134 (H) 70 - 99 mg/dL   BUN 16 6 - 20 mg/dL   Creatinine, Ser 0.76 0.44 - 1.00 mg/dL   Calcium 9.6 8.9 - 10.3 mg/dL   Total Protein 6.7 6.5 - 8.1 g/dL   Albumin 2.8 (L) 3.5 - 5.0 g/dL   AST 23 15 - 41 U/L   ALT 9 0 - 44 U/L   Alkaline Phosphatase 81 38 - 126 U/L   Total Bilirubin 0.8 0.3 - 1.2 mg/dL   GFR calc non Af Amer >60 >60 mL/min   GFR calc Af Amer >60 >60 mL/min   Anion gap 7 5 - 15    Comment: Performed at [facility] [address],  [place], [place] [address]   US Renal  Result Date: 01/27/2019 CLINICAL DATA:  Neurogenic bladder. EXAM: RENAL / URINARY TRACT ULTRASOUND COMPLETE COMPARISON:  Abdominal CT 06/06/2017 FINDINGS: Technically challenging and limited exam due to habitus and difficulty with patient positioning, patient is paraplegic. Right Kidney: Renal measurements: 11.4 x 5.7 x 6.9 cm = volume: 236 mL. Upper pole is obscured not well visualized. Echogenicity within normal limits. No mass or hydronephrosis visualized. Left Kidney: No visualized sonographically due to habitus. Bladder: Only minimally distended, not well evaluated. Appears grossly normal for degree of bladder distention. Other: None. IMPRESSION: 1. Technically limited exam due to habitus and difficulty with patient positioning. The left kidney is not visualized. 2. No right hydronephrosis or acute findings. 3. Bilaterally minimally distended and not well evaluated. Electronically Signed   By: Keith Rake M.D.   On: 01/27/2019 04:00   Dg Chest Port 1 View  Result Date: 01/26/2019 CLINICAL DATA:  Cough, fever, central chest pain since yesterday EXAM: PORTABLE CHEST 1 VIEW COMPARISON:  Radiograph September 28, 2018, CT August 09, 2018 FINDINGS: Imaging quality degraded by body habitus. No focal consolidative opacity is seen. No pneumothorax or visible effusion. The cardiomediastinal contours are unremarkable. No acute osseous or soft tissue abnormality. IMPRESSION: Accounting for body habitus, the lungs are clear. Electronically Signed   By: Lovena Le M.D.   On: 01/26/2019 20:21    Pending Labs Unresulted Labs (From admission, onward)    Start     Ordered   01/28/19 0500  Basic metabolic panel  Tomorrow morning,   R    Question:  Specimen collection method  Answer:  IV Team=IV Team collect   01/27/19 1115   01/28/19 0500  CBC  Tomorrow morning,   R    Question:  Specimen collection method  Answer:  IV Team=IV Team collect   01/27/19 1115   01/28/19 0500   Magnesium  Tomorrow morning,   R    Question:  Specimen collection method  Answer:  IV Team=IV Team collect   01/27/19 1115   01/26/19 1903  Urine culture  ONCE - STAT,   STAT     01/26/19 1903          Vitals/Pain Today's Vitals   01/27/19 1837 01/27/19 1839 01/27/19 1930 01/27/19 2003  BP: 124/81   (!) 124/52  Pulse: (!) 126   (!) 125  Resp: (!) 24   (!) 24  Temp: (!) 102.9 F (39.4 C)   (!) 102.9 F (39.4 C)  TempSrc: Oral   Oral  SpO2: 95%   98%  Weight:      Height:      PainSc: 0-No pain 0-No pain 0-No pain 0-No pain    Isolation Precautions No active isolations  Medications Medications  cefTRIAXone (ROCEPHIN) 2 g in sodium chloride 0.9 % 100 mL IVPB (0 g Intravenous Stopped 01/27/19 1954)  sodium chloride flush (NS) 0.9 % injection 10-40 mL (10 mLs Intracatheter Not Given 01/27/19 0950)  sodium chloride flush (NS) 0.9 % injection 10-40 mL (has no administration in time range)  enoxaparin (LOVENOX) injection 80 mg (80 mg Subcutaneous Not Given 01/27/19 1031)  acetaminophen (TYLENOL) tablet 650 mg (650 mg Oral Given 01/27/19 1704)    Or  acetaminophen (TYLENOL) suppository 650 mg ( Rectal See Alternative 01/27/19 1704)  ondansetron (ZOFRAN) tablet 4 mg (has no administration in time range)    Or  ondansetron (ZOFRAN) injection 4 mg (has no administration in time range)  aspirin chewable tablet 81 mg (81 mg Oral Given 01/27/19 1024)  escitalopram (LEXAPRO) tablet 10 mg (10 mg Oral Given 01/27/19 1034)  pantoprazole (PROTONIX) EC tablet 40 mg (40 mg Oral Given 01/27/19 1024)  pregabalin (LYRICA) capsule 100 mg (100 mg Oral Given 01/27/19 1634)  albuterol (PROVENTIL) (2.5 MG/3ML) 0.083% nebulizer solution 2.5 mg (has no administration in time range)  atorvastatin (LIPITOR) tablet 20 mg (20 mg Oral Given 01/27/19 1839)  lactated ringers infusion ( Intravenous New Bag/Given 01/27/19 1151)  baclofen (LIORESAL) tablet 10 mg (10 mg Oral Given 01/27/19 1634)  sodium chloride 0.9 %  bolus 1,000 mL (0 mLs Intravenous Stopped 01/27/19 0203)  acetaminophen (TYLENOL) tablet 1,000 mg (1,000 mg Oral Given 01/26/19 2129)  magnesium sulfate IVPB 2 g 50 mL (0 g Intravenous Stopped 01/27/19 0508)  lactated ringers bolus 1,000 mL (0 mLs Intravenous Stopped 01/27/19 1100)  lactated ringers bolus 1,000 mL (0 mLs Intravenous Stopped 01/27/19 1157)    Mobility Paraplegic      Focused Assessments Pulmonary Assessment Handoff:  Lung sounds:   O2 Device: Room Air        R Recommendations: See Admitting Provider Note  Report given to:   Additional Notes: Patient is A&Ox 4; denies pain or SOB at this time; pt continues to have fever of 102.9 with tachycardia (Md is aware);Last dose of Tylenol was at 1704; No changes while in ED-Monique,RN

## 2019-01-28 LAB — BASIC METABOLIC PANEL
Anion gap: 8 (ref 5–15)
BUN: 10 mg/dL (ref 6–20)
CO2: 24 mmol/L (ref 22–32)
Calcium: 9.2 mg/dL (ref 8.9–10.3)
Chloride: 107 mmol/L (ref 98–111)
Creatinine, Ser: 0.58 mg/dL (ref 0.44–1.00)
GFR calc Af Amer: >60 mL/min (ref >60)
GFR calc non Af Amer: >60 mL/min (ref >60)
Glucose, Bld: 139 mg/dL — ABNORMAL HIGH (ref 70–99)
Potassium: 3.4 mmol/L — ABNORMAL LOW (ref 3.5–5.1)
Sodium: 139 mmol/L (ref 135–145)

## 2019-01-28 LAB — MAGNESIUM: Magnesium: 1.6 mg/dL — ABNORMAL LOW (ref 1.7–2.4)

## 2019-01-28 LAB — URINE CULTURE

## 2019-01-28 LAB — CBC
HCT: 27.4 % — ABNORMAL LOW (ref 36.0–46.0)
Hemoglobin: 9 g/dL — ABNORMAL LOW (ref 12.0–15.0)
MCH: 31.5 pg (ref 26.0–34.0)
MCHC: 32.8 g/dL (ref 30.0–36.0)
MCV: 95.8 fL (ref 80.0–100.0)
Platelets: 296 10*3/uL (ref 150–400)
RBC: 2.86 MIL/uL — ABNORMAL LOW (ref 3.87–5.11)
RDW: 11.9 % (ref 11.5–15.5)
WBC: 11.3 10*3/uL — ABNORMAL HIGH (ref 4.0–10.5)
nRBC: 0 % (ref 0.0–0.2)

## 2019-01-28 MED ORDER — OXYCODONE-ACETAMINOPHEN 5-325 MG PO TABS
1.0000 | ORAL_TABLET | Freq: Four times a day (QID) | ORAL | Status: DC | PRN
  Administered 2019-01-29 – 2019-01-31 (×3): 1 via ORAL
  Filled 2019-01-28 (×3): qty 1

## 2019-01-28 MED ORDER — POTASSIUM CHLORIDE CRYS ER 20 MEQ PO TBCR
40.0000 meq | EXTENDED_RELEASE_TABLET | Freq: Two times a day (BID) | ORAL | Status: AC
  Administered 2019-01-28 (×2): 40 meq via ORAL
  Filled 2019-01-28 (×2): qty 2

## 2019-01-28 MED ORDER — MAGNESIUM SULFATE 2 GM/50ML IV SOLN
2.0000 g | Freq: Once | INTRAVENOUS | Status: AC
  Administered 2019-01-28: 2 g via INTRAVENOUS
  Filled 2019-01-28: qty 50

## 2019-01-28 NOTE — Progress Notes (Signed)
RN paged MD about elevated temp 101.3.  Tylenol administered.  RN asked MD if patient should be on telemetry monitoring. No new orders at this time.

## 2019-01-28 NOTE — Evaluation (Signed)
Occupational Therapy Evaluation Patient Details Name: Ellen [name] MRN: [id number] DOB: 09/21/1960 Today's Date: 01/28/2019    History of Present Illness Ellen [name] is a 58yo female with PMH OSA, multiple UTIs, morbid obesity, cholecystectomy, paraplegia, urinary incontinence, and GERD presenting with dysuria, fever, and chills for the past two days. 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   Clinical Impression   Patient presenting with decreased I in bed mobility, self care, endurance, and strength. Patient reports having an aide 7x/wk for 2 hours each day to assist with self care tasks from bed level PTA. Hoyer lift for transfer at baseline. She does report PT assisting with sitting EOB.  Patient will benefit from acute OT in order to prevent pt from becoming much weaker from bed rest and decrease change of worsening skin integrity.    Follow Up Recommendations  No OT follow up;Supervision/Assistance - 24 hour    Equipment Recommendations  None recommended by OT       Precautions / Restrictions Precautions Precautions: Fall Precaution Comments: paraplegia Restrictions Weight Bearing Restrictions: No      Mobility Bed Mobility Overal bed mobility: Needs Assistance Bed Mobility: Rolling Rolling: Total assist         General bed mobility comments: total A for rolling to the L this session would have needed a second person if needing to provide care  Transfers      General transfer comment: dependent at home via hoyer        ADL either performed or assessed with clinical judgement   ADL Overall ADL's : Needs assistance/impaired Eating/Feeding: Set up;Bed level   Grooming: Wash/dry hands;Wash/dry face;Oral care;Bed level;Set up   Upper Body Bathing: Moderate assistance;Bed level    Lower Body Bathing: Bed level;Total assistance Lower Body Bathing Details (indicate cue type and reason): need +2 for safety and to physically move pt as needed Upper Body Dressing : Maximal assistance;Bed level   Lower Body Dressing: Total assistance;+2 for physical assistance;Bed level     Toilet Transfer Details (indicate cue type and reason): Pt does not get onto toilet at home          Vision Baseline Vision/History: No visual deficits Patient Visual Report: No change from baseline              Pertinent Vitals/Pain Pain Assessment: Faces Faces Pain Scale: No hurt     Hand Dominance Right   Extremity/Trunk Assessment Upper Extremity Assessment Upper Extremity Assessment: Generalized weakness   Lower Extremity Assessment Lower Extremity Assessment: LLE deficits/detail;RLE deficits/detail RLE Deficits / Details: heavy legs with increased tone, some active movement on L LE more than R LE       Communication Communication Communication: No difficulties   Cognition Arousal/Alertness: Lethargic Behavior During Therapy: WFL for tasks assessed/performed Overall Cognitive Status: Within Functional Limits for tasks assessed           General Comments: very difficult to awaken for therapeutic intervention at first. Pt reports not sleeping well.   General Comments  unable to attempt safely this session and pt continues to be lethargic throughout            [place] expects to be discharged to:: Private residence Living Arrangements: Spouse/significant other;Other relatives Available Help at Discharge: Family;Available 24 hours/day;Personal care attendant Type of Home: House  Home Layout: One level     Bathroom Shower/Tub: Tub/shower unit         Home Equipment: Shower seat;Bedside commode;Wheelchair - manual;Hospital bed   Additional Comments: [employer]      Prior Functioning/Environment Level of Independence: Needs  assistance  Gait / Transfers Assistance Needed: Pt reports she sits EOB with her home PT , uses lift to get to [facility], ADL's / Homemaking Assistance Needed: bathes in the bed, has a [place] aide, total care for cooking/cleaning.    Comments: Pt reports her aide comes 2hrs/7x week and assistes with self care at bed level. Her partner turns her for positioning to prevent bed sores        OT Problem List: Decreased strength;Decreased activity tolerance;Decreased safety awareness;Impaired balance (sitting and/or standing)      OT Treatment/Interventions: Therapeutic activities;Neuromuscular education;Energy conservation;DME and/or AE instruction;Patient/family education;Balance training;Therapeutic exercise    OT Goals(Current goals can be found in the care plan section) Acute Rehab OT Goals Patient Stated Goal: to return home OT Goal Formulation: With patient Time For Goal Achievement: 02/11/19 Potential to Achieve Goals: Good ADL Goals Pt Will Perform Grooming: with set-up Pt Will Perform Upper Body Bathing: with set-up;bed level Pt Will Perform Upper Body Dressing: with min assist Pt/caregiver will Perform Home Exercise Program: With theraband;Both right and left upper extremity  OT Frequency: Min 2X/week   Barriers to D/C:    none known at this time          AM-PAC OT "6 Clicks" Daily Activity     Outcome Measure Help from another person eating meals?: A Little Help from another person taking care of personal grooming?: A Little Help from another person toileting, which includes using toliet, bedpan, or urinal?: Total Help from another person bathing (including washing, rinsing, drying)?: Total Help from another person to put on and taking off regular upper body clothing?: Total Help from another person to put on and taking off regular lower body clothing?: Total 6 Click Score: 10   End of Session Nurse Communication: Mobility status;Other (comment)(PLOF)  Activity Tolerance:  Patient limited by lethargy Patient left: in bed;with bed alarm set;with call bell/phone within reach  OT Visit Diagnosis: Muscle weakness (generalized) (M62.81)                Time: 1045-1100 OT Time Calculation (min): 15 min Charges:  OT General Charges $OT Visit: 1 Visit OT Evaluation $OT Eval High Complexity: 1 High   Mieke Brinley P MS, OTR/L 01/28/2019, 12:43 PM

## 2019-01-28 NOTE — Progress Notes (Signed)
Pt c/o feeling short of breath while resting in bed.  Pt pulse ox obtained and o2 sats 95% ra.  RN and NT repositioned patient and pulse ox showed 98% ra.  Pt pulse up to 120 while moving in bed, unsustained.

## 2019-01-28 NOTE — Progress Notes (Addendum)
   Vital Signs MEWS/VS Documentation      01/27/2019 1837 01/27/2019 2003 01/27/2019 2231 01/28/2019 0117   MEWS Score:  5  5  4  4    MEWS Score Color:  Red  Red  Red  Red   Resp:  (!) 24  (!) 24  (!) 22  (!) 22   Pulse:  (!) 126  (!) 125  (!) 120  (!) 118   BP:  124/81  (!) 124/52  123/76  112/64   Temp:  (!) 102.9 F (39.4 C)  (!) 102.9 F (39.4 C)  (!) 100.9 F (38.3 C)  (!) 100.7 F (38.2 C)   O2 Device:  Room Biomedical scientist  Room Air   Level of Consciousness:  -  -  -  Alert     Notified MD and Rapid Response RN. No new orders at this time. MD said since not an acute change to do vital signs q4hrs. Pt recently put on CPaP machine. Will continue to monitor       [employer] 01/28/2019,1:32 AM

## 2019-01-28 NOTE — Progress Notes (Addendum)
Pt drowsy and c/o sob. VSS.  RN paged MD.

## 2019-01-28 NOTE — Progress Notes (Addendum)
Subjective:  Yesterday pt unintentional pulled out her IV which resulted in some bleeding that resolved. Pt was given LR 500 mL bolus for fever and headache, which resolved.   This morning, pt reports having 9/10 back pain, left shoulder pain, and knee pain. She reports this pain is similar to the pain she has experienced in the past. She has not noticed any other symptoms bothering her.    Objective:  Vital signs in last 24 hours: Vitals:   01/28/19 0132 01/28/19 0309 01/28/19 0321 01/28/19 0741  BP:  (!) 86/71 114/63 (!) 129/53  Pulse: (!) 121 (!) 108 (!) 105 (!) 108  Resp: 20     Temp:  99.2 F (37.3 C)  98.3 F (36.8 C)  TempSrc:  Oral  Oral  SpO2: 95% 100%  95%  Weight:      Height:       Weight change:   Intake/Output Summary (Last 24 hours) at 01/28/2019 1329 Last data filed at 01/28/2019 0900 Gross per 24 hour  Intake 2152.94 ml  Output 850 ml  Net 1302.94 ml   Physical Exam Constitutional:      General: She is not in acute distress.    Appearance: She is obese. She is not ill-appearing.  HENT:     Mouth/Throat:     Mouth: Mucous membranes are moist.  Eyes:     General: No scleral icterus.    Conjunctiva/sclera: Conjunctivae normal.  Cardiovascular:     Rate and Rhythm: Normal rate and regular rhythm.  Pulmonary:     Effort: Pulmonary effort is normal.  Abdominal:     General: Bowel sounds are normal.     Tenderness: There is abdominal tenderness. There is guarding.     Comments: Tenderness to palpation and some guarding in the RUQ  Musculoskeletal: Normal range of motion.     Comments: Shoulder ROM was normal  Skin:    General: Skin is warm and dry.  Neurological:     Mental Status: She is alert.  Psychiatric:        Mood and Affect: Mood normal.    Labs: K 3.4, Mg 1.6 Hgb: 10.5->9.0, 32.6->27.4, WBC 14.3->11.3  Assessment/Plan:  Principal Problem:   Pyelonephritis Active Problems:   Arthritis of both knees   Back pain   OSA  (obstructive sleep apnea)   Paraplegia (HCC)   Neurogenic bladder   Ellen [name] is a 58yo female with PMH ofmultiple UTIs,urinary incontinence, OSA and morbid obesity presenting with a 2 day history of dysuria, fever, and chills. Pt remains febrile and tachycardic. Urine culture result came back indeterminate, white cell count down-trending on ceftriaxone 2 mg. Presumed pyelonephritis seems less likely based on these results, need to continue further work-up to identify source of likely infection.  Neurogenic Bladder:  Febrile illness/Pyelonephritis?: Pt receiving treatment for pyelonephritis based on urinary symptoms and fever. Pt's history of UTI's supported this as well. Etiology is less certain, urine culture resulted in multiple indeterminate species, decreasing likelihood of pyelonephritis. Pt has been successfully treated for UTI's in past with culture results of multiple species. Leukocytosis has been improving on ceftriaxone 2mg . If pt does not improve, should consider renal abscess. RUQ tenderness to palpation could indicate kidney infection or infectious process of the liver as well. -Continue IV Ceftriaxone 2 g daily  - Will order repeat urine culture   Shortness of Breath:  Patient no longer endorsing shortness of breath, has improved. - continue home atrovent prn - Greatly appreciate  physical therapy recs  Arthritis of knees, back pain: Back and knee pain are of chronic nature. Has been previously treated by pain management clinic with oxycodone and tylenol. - Continue baclofen 10 mg, three times daily & Tylenol 650 mg prn for pain - Will order Percocet 5-325 mg PRN for pain - Physical therapy on board, greatly appreciate recs  OSA Patient with history of OSA on CPAP at home.  - Continue home CPAP    LOS: 1 day   Carin Primrose, Medical Student 01/28/2019, 1:29 PM

## 2019-01-29 ENCOUNTER — Inpatient Hospital Stay (HOSPITAL_COMMUNITY): Payer: Medicare HMO

## 2019-01-29 LAB — BASIC METABOLIC PANEL
Anion gap: 8 (ref 5–15)
BUN: 8 mg/dL (ref 6–20)
CO2: 22 mmol/L (ref 22–32)
Calcium: 9.3 mg/dL (ref 8.9–10.3)
Chloride: 107 mmol/L (ref 98–111)
Creatinine, Ser: 0.61 mg/dL (ref 0.44–1.00)
GFR calc Af Amer: >60 mL/min (ref >60)
GFR calc non Af Amer: >60 mL/min (ref >60)
Glucose, Bld: 154 mg/dL — ABNORMAL HIGH (ref 70–99)
Potassium: 4.2 mmol/L (ref 3.5–5.1)
Sodium: 137 mmol/L (ref 135–145)

## 2019-01-29 LAB — URINE CULTURE
Culture: <10000 — AB
Special Requests: NORMAL

## 2019-01-29 LAB — CBC
HCT: 26.9 % — ABNORMAL LOW (ref 36.0–46.0)
Hemoglobin: 8.6 g/dL — ABNORMAL LOW (ref 12.0–15.0)
MCH: 31.3 pg (ref 26.0–34.0)
MCHC: 32 g/dL (ref 30.0–36.0)
MCV: 97.8 fL (ref 80.0–100.0)
Platelets: 288 10*3/uL (ref 150–400)
RBC: 2.75 MIL/uL — ABNORMAL LOW (ref 3.87–5.11)
RDW: 11.9 % (ref 11.5–15.5)
WBC: 10.8 10*3/uL — ABNORMAL HIGH (ref 4.0–10.5)
nRBC: 0 % (ref 0.0–0.2)

## 2019-01-29 LAB — MAGNESIUM: Magnesium: 1.7 mg/dL (ref 1.7–2.4)

## 2019-01-29 MED ORDER — IOHEXOL 300 MG/ML  SOLN
100.0000 mL | Freq: Once | INTRAMUSCULAR | Status: AC | PRN
  Administered 2019-01-29: 100 mL via INTRAVENOUS

## 2019-01-29 NOTE — Progress Notes (Signed)
Subjective:  Overnight, pt had an episode of dyspnea and a fever of 102 while laying in bed. Pt's O@ sat was 95% at this time. Pt was provided supplemental O2 2L by nasal canula and sats rose to 98%.  This morning pt appears comfortable and is pleasant. She is still endorsing bilateral shoulder pain and some back pain.   Objective:  Vital signs in last 24 hours: Vitals:   01/29/19 0045 01/29/19 0115 01/29/19 0600 01/29/19 0900  BP:   (!) 158/69 117/62  Pulse: (!) 102  (!) 101 96  Resp: 18  (!) 22 18  Temp:  100 F (37.8 C) (!) 101.2 F (38.4 C) 99.3 F (37.4 C)  TempSrc:  Oral Oral Oral  SpO2: 97%  98% 94%  Weight:      Height:       Weight change:   Intake/Output Summary (Last 24 hours) at 01/29/2019 1110 Last data filed at 01/29/2019 0859 Gross per 24 hour  Intake 128 ml  Output 2640 ml  Net -2512 ml   Physical Exam Constitutional:      General: She is not in acute distress.    Appearance: She is obese.  Eyes:     General: No scleral icterus.    Conjunctiva/sclera: Conjunctivae normal.  Cardiovascular:     Rate and Rhythm: Normal rate and regular rhythm.     Pulses: Normal pulses.  Pulmonary:     Effort: Pulmonary effort is normal. No respiratory distress.     Breath sounds: Normal breath sounds. No wheezing.  Abdominal:     General: Bowel sounds are normal.     Palpations: Abdomen is soft.     Tenderness: There is abdominal tenderness.     Comments: Mild tenderness to palpation in RUQ and LUQ  Skin:    General: Skin is warm and dry.     Capillary Refill: Capillary refill takes less than 2 seconds.  Neurological:     General: No focal deficit present.     Mental Status: She is alert. Mental status is at baseline.  Psychiatric:        Mood and Affect: Mood normal.        Behavior: Behavior normal.    Assessment/Plan:  Principal Problem:   Pyelonephritis Active Problems:   Arthritis of both knees   Back pain   OSA (obstructive sleep apnea)  Paraplegia (HCC)   Neurogenic bladder  Ellen [name] is a 58yo female with PMHofmultiple UTIs,urinary incontinence,OSA, paraplegiaandmorbid obesitypresenting with a 2 day history of dysuria, fever, and chills. Pt remains febrile 3 days after initiation of antibiotic treatment. WBC improving on treatment for presumed pyelonephritis, however non-refractory fever suggest need for continued work-up.  Neurogenic Bladder:  Febrile illness/Pyelonephritis?: Pt receiving treatment for presumed pyelonephritis, however indeterminate urine culture and 3 days sustained fever suggests this diagnosis is less likely. Based on previous urinary sx and current physical exam findings indicate paranephric abscess is a possible etiology. Pt's episodes of dyspnea since admission could also suggest pulmonary infection. 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 - Will order CT w/ contrast of the chest and abdomen/pelvis - Will order respiratory viral pathogen panel - Will order repeat blood Cx - Repeat Urine Cx result pending -ContinueIV Ceftriaxone2g daily  Shortness of Breath:  Pt continues to have episodes of dyspnea in the hospital, most recent episode  associated with fever. Could be due to infectious process related to febrile illness. Most likely also associated with OSA exacerbation and by body habitus impeding pulmonary function/obesity hypoventilation syndrome - continue home atrovent prn - Continue PRN supplemental O2 - Greatly appreciate physical therapy recs  Arthritis of knees, back pain: Back and knee pain are of chronic nature. Has been previously treated by pain management clinic with oxycodone and tylenol. - Continue baclofen 10 mg, three times daily & Tylenol 650 mg prn for pain - Will order Percocet 5-325 mg PRN for pain - Physical therapy on  board, greatly appreciate recs  OSA Patient with history of OSA on CPAP at home. - Continue home CPAP   LOS: 2 days   Carin Primrose, Medical Student 01/29/2019, 11:10 AM

## 2019-01-29 NOTE — Progress Notes (Signed)
Pt complained about being short of breath and labored while resting in bed. RN and NT repositioned pt and placed supplemental 02 to help with comfort. Pt stating at 98% on 2L nasal canula.

## 2019-01-29 NOTE — Progress Notes (Signed)
   Vital Signs MEWS/VS Documentation      01/28/2019 2326 01/29/2019 0045 01/29/2019 0115 01/29/2019 0600   MEWS Score:  1  2  1  3    MEWS Score Color:  Green  Yellow  Green  Yellow   Resp:  20  18  -  (!) 22   Pulse:  100  (!) 102  -  (!) 101   BP:  128/63  -  -  (!) 158/69   Temp:  -  -  100 F (37.8 C)  (!) 101.2 F (38.4 C)   O2 Device:  -  -  -  Nasal Cannula   O2 Flow Rate (L/min):  -  -  -  2 L/min   Level of Consciousness:  -  -  -  Alert     MD notified, no new orders currently. Pt is stable. Will continue to monitor.       Melik Blancett J Mescal Flinchbaugh 01/29/2019,6:52 AM

## 2019-01-30 LAB — BASIC METABOLIC PANEL
Anion gap: 7 (ref 5–15)
BUN: 10 mg/dL (ref 6–20)
CO2: 25 mmol/L (ref 22–32)
Calcium: 9.6 mg/dL (ref 8.9–10.3)
Chloride: 107 mmol/L (ref 98–111)
Creatinine, Ser: 0.73 mg/dL (ref 0.44–1.00)
GFR calc Af Amer: >60 mL/min (ref >60)
GFR calc non Af Amer: >60 mL/min (ref >60)
Glucose, Bld: 156 mg/dL — ABNORMAL HIGH (ref 70–99)
Potassium: 4.3 mmol/L (ref 3.5–5.1)
Sodium: 139 mmol/L (ref 135–145)

## 2019-01-30 LAB — CBC
HCT: 27.1 % — ABNORMAL LOW (ref 36.0–46.0)
Hemoglobin: 8.4 g/dL — ABNORMAL LOW (ref 12.0–15.0)
MCH: 30.8 pg (ref 26.0–34.0)
MCHC: 31 g/dL (ref 30.0–36.0)
MCV: 99.3 fL (ref 80.0–100.0)
Platelets: 317 10*3/uL (ref 150–400)
RBC: 2.73 MIL/uL — ABNORMAL LOW (ref 3.87–5.11)
RDW: 11.9 % (ref 11.5–15.5)
WBC: 9.6 10*3/uL (ref 4.0–10.5)
nRBC: 0 % (ref 0.0–0.2)

## 2019-01-30 LAB — RESPIRATORY PANEL BY PCR

## 2019-01-30 MED ORDER — ACETAMINOPHEN 325 MG PO TABS
650.0000 mg | ORAL_TABLET | Freq: Four times a day (QID) | ORAL | Status: DC
  Administered 2019-01-30 – 2019-01-31 (×4): 650 mg via ORAL
  Filled 2019-01-30 (×4): qty 2

## 2019-01-30 MED ORDER — ACETAMINOPHEN 650 MG RE SUPP
650.0000 mg | Freq: Four times a day (QID) | RECTAL | Status: DC
  Filled 2019-01-30: qty 1

## 2019-01-30 MED ORDER — POLYETHYLENE GLYCOL 3350 17 G PO PACK: 17.0000 g | PACK | Freq: Every day | ORAL | Status: DC | PRN

## 2019-01-30 NOTE — Progress Notes (Signed)
Pt continues to get sob whenever laid back to be changed or repositioned but back to normal once sat straight back up.

## 2019-01-30 NOTE — Progress Notes (Signed)
RT placed pt on CPAP dream station on auto titrate for the night with no oxygen bled in with settings of max 16 min 4. RT will continue to monitor.

## 2019-01-30 NOTE — Progress Notes (Signed)
Physical Therapy Treatment Patient Details Name: Ellen [name] MRN: [id number] DOB: 12-08-1960 Today's Date: 01/30/2019    History of Present Illness Jamell D. Sherwin is a 58yo female with PMH OSA, multiple UTIs, morbid obesity, cholecystectomy, paraplegia, urinary incontinence, and GERD presenting with dysuria, fever, and chills for the past two days. 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    PT Comments    Pt in bed upon arrival of PT/OT, but agreeable to session focused on bed mobility and transferring to sitting edge of bed as the pt reported this would be the most helpful thing to work on. The pt continues to need assist in getting to edge of bed, but was able to initiate movements in all extremities. Pt LE movement continues to be limited by painful spasms in her low back, but she is able to use them to scoot in bed and roll when assisted in positioning. The pt was able to sit EOB for ~6 min today and worked on maintaining upright posture with core musculature rather than reliant on BUE to maintain. MinA with moments of mod/maxA to maintain sitting edge of bed due to painful spasms. The pt will continue to benefit from skilled PT to address functional mobility and seated balance.     Follow Up Recommendations  Home health PT;Supervision/Assistance - 24 hour     Equipment Recommendations  None recommended by PT(pt has needed equipment at home)    Recommendations for Other Services       Precautions / Restrictions Precautions Precautions: Fall Precaution Comments: paraplegia Restrictions Weight Bearing Restrictions: No    Mobility  Bed Mobility Overal bed mobility: Needs Assistance Bed Mobility: Rolling;Supine to Sit;Sit to Supine Rolling: Mod assist;+2 for physical assistance   Supine to sit: Mod  assist;Max assist;+2 for physical assistance;HOB elevated Sit to supine: Max assist;+2 for physical assistance   General bed mobility comments: pt completes rolling with MOD A +2 to assist with positioning BLEs, good UB strength to reach for hand rails to roll, cues for technique and sequencing of task. Pt initially moving BLEs to EOB but required MOD- MAX A +2 for all bed mobility d/t LE spasms. MAX A +2 for sit>supine to manage UE and trunk during transfer  Transfers                 General transfer comment: dependent at home via hoyer  Ambulation/Gait                 Stairs             Wheelchair Mobility    Modified Rankin (Stroke Patients Only)       Balance Overall balance assessment: Needs assistance Sitting-balance support: Bilateral upper extremity supported;Single extremity supported;Feet supported Sitting balance-Leahy Scale: Poor Sitting balance - Comments: MIN A for sitting balance with MOD- MAX A when spasms present     Standing balance-Leahy Scale: Zero Standing balance comment: unable to attempt                            Cognition Arousal/Alertness: Awake/alert Behavior During Therapy: WFL for tasks assessed/performed Overall Cognitive Status: Within Functional Limits for tasks assessed  General Comments: pt pleasant and appreciative of session      Exercises      General Comments        Pertinent Vitals/Pain Pain Assessment: Faces Pain Score: 8  Faces Pain Scale: Hurts even more Pain Location: legs and back when in spasm Pain Descriptors / Indicators: Aching;Discomfort;Sharp;Spasm Pain Intervention(s): Limited activity within patient's tolerance;Monitored during session;Repositioned    Home Living                      Prior Function            PT Goals (current goals can now be found in the care plan section) Acute Rehab PT Goals Patient Stated Goal:  to return home PT Goal Formulation: With patient Time For Goal Achievement: 02/10/19 Potential to Achieve Goals: Fair Progress towards PT goals: Progressing toward goals    Frequency    Min 2X/week      PT Plan Current plan remains appropriate    Co-evaluation PT/OT/SLP Co-Evaluation/Treatment: Yes Reason for Co-Treatment: Complexity of the patient's impairments (multi-system involvement);For patient/therapist safety;To address functional/ADL transfers PT goals addressed during session: Mobility/safety with mobility;Strengthening/ROM;Balance OT goals addressed during session: ADL's and self-care      AM-PAC PT "6 Clicks" Mobility   Outcome Measure  Help needed turning from your back to your side while in a flat bed without using bedrails?: Total Help needed moving from lying on your back to sitting on the side of a flat bed without using bedrails?: Total Help needed moving to and from a bed to a chair (including a wheelchair)?: Total Help needed standing up from a chair using your arms (e.g., wheelchair or bedside chair)?: Total Help needed to walk in hospital room?: Total Help needed climbing 3-5 steps with a railing? : Total 6 Click Score: 6    End of Session   Activity Tolerance: Patient tolerated treatment well Patient left: in bed;with call bell/phone within reach Nurse Communication: Mobility status;Need for lift equipment PT Visit Diagnosis: Other abnormalities of gait and mobility (R26.89);Muscle weakness (generalized) (M62.81)     Time: [id number] PT Time Calculation (min) (ACUTE ONLY): 32 min  Charges:  $Therapeutic Activity: 8-22 mins                     Karma Ganja, PT, DPT   Acute Rehabilitation Department [phone]   Otho Bellows 01/30/2019, 11:47 AM

## 2019-01-30 NOTE — Progress Notes (Signed)
   Subjective:  Pt had orthopnea last night, resolved with keeping the bed upright. Pt had some difficulty keeping the CPAP machine on her face. Pt also reported that her home CPAP machine broke about 1 month ago, which is when she started experiencing these episodes of orthopnea.   Pt reports feeling well this morning overall. No fevers or dysuria, however, still endorses feeling chills and cold.   Objective:  Vital signs in last 24 hours: Vitals:   01/30/19 0315 01/30/19 0603 01/30/19 0629 01/30/19 0747  BP: (!) 133/52   127/71  Pulse: 62   90  Resp: 18     Temp: 97.7 F (36.5 C)  99.2 F (37.3 C) 99.2 F (37.3 C)  TempSrc: Oral  Oral Oral  SpO2: 97%   95%  Weight:  (!) 177.4 kg    Height:       Weight change:   Intake/Output Summary (Last 24 hours) at 01/30/2019 1134 Last data filed at 01/30/2019 [id number] Gross per 24 hour  Intake 10 ml  Output 4050 ml  Net -4040 ml   Physical Exam Constitutional:      General: She is not in acute distress.    Appearance: She is obese.  Eyes:     General: No scleral icterus.    Conjunctiva/sclera: Conjunctivae normal.  Cardiovascular:     Rate and Rhythm: Normal rate and regular rhythm.  Pulmonary:     Effort: Pulmonary effort is normal.     Comments: Limited ability to hear breath sounds Neurological:     General: No focal deficit present.     Mental Status: She is alert. Mental status is at baseline.  Psychiatric:        Mood and Affect: Mood normal.    Labs: WBC: 10.8->9.6 Hgb: 8.6->8.4 Hct: 26.9->27.1   Assessment/Plan:  Principal Problem:   Pyelonephritis Active Problems:   Arthritis of both knees   Back pain   OSA (obstructive sleep apnea)   Paraplegia (HCC)   Neurogenic bladder  Ellen [name] is a 58yo female with PMHofmultiple UTIs,urinary incontinence,OSA, paraplegiaandmorbid obesitypresenting witha 2 day history ofdysuria, fever, and chills. Pt has had lower grade fevers and only spiked a  fever once last night. CT findings support initial diagnosis of pyelonephritis.  Pyelonephritis: CT from yesterday supports diagnosis of pyelonephritis, which pt was already receiving treatment for. - Repeat blood and urine Cx result pending -ContinueIV Ceftriaxone2g daily - Schedule Tylenol 650 mg q6hr for fever  Shortness of Breath/OSA:  Pt reports 1 month without use of CPAP machine, which is most likely contributing to the pt's exacerbation of her orthopnea. - continue home atrovent prn - Continue PRN supplemental O2 - Continue home CPAP use -Greatly appreciate Respiratory therapy recs - Pt could benefit from f/u with PCP about new CPAP machine  Arthritis of knees, back pain: Pt no longer endorsing pain, well-controlled. - Continue baclofen 10 mg, three times daily & Tylenol 650 mg q6h for pain - Will order Percocet 5-325 mg PRN for pain - Physical therapy on board, greatly appreciate recs   LOS: 3 days   Carin Primrose, Medical Student 01/30/2019, 11:34 AM

## 2019-01-30 NOTE — Progress Notes (Signed)
Occupational Therapy Treatment Patient Details Name: Ellen [name] MRN: [id number] DOB: Dec 02, 1960 Today's Date: 01/30/2019    History of present illness Alisan D. Fredricks is a 58yo female with PMH OSA, multiple UTIs, morbid obesity, cholecystectomy, paraplegia, urinary incontinence, and GERD presenting with dysuria, fever, and chills for the past two days. 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   OT comments  Pt making steady progress towards OT goals this session. Session focus on sitting balance as precursor to higher level ADLs. Overall, pt required MOD-MAX A +2 for bed mobility. Pt sat EOB for  ~ 6 minutes with MIN A but needing MOD- MAX A with spasms present. DC plan remains appropriate, will continue to follow acutely per POC.    Follow Up Recommendations  No OT follow up;Supervision/Assistance - 24 hour    Equipment Recommendations  None recommended by OT    Recommendations for Other Services      Precautions / Restrictions Precautions Precautions: Fall Precaution Comments: paraplegia Restrictions Weight Bearing Restrictions: No       Mobility Bed Mobility Overal bed mobility: Needs Assistance Bed Mobility: Rolling;Supine to Sit;Sit to Supine Rolling: Mod assist;+2 for physical assistance   Supine to sit: Mod assist;Max assist;+2 for physical assistance;HOB elevated Sit to supine: Max assist;+2 for physical assistance   General bed mobility comments: pt completes rolling with MOD A +2 to assist with positioning BLEs, good UB strength to reach for hand rails to roll, cues for technique and sequencing of task. Pt initially moving BLEs to EOB but required MOD- MAX A +2 for all bed mobility d/t LE spasms. MAX A +2 for sit>supine to manage UB and trunk during transfer  Transfers                  General transfer comment: dependent at home via hoyer    Balance Overall balance assessment: Needs assistance Sitting-balance support: Bilateral upper extremity supported;Single extremity supported;Feet supported Sitting balance-Leahy Scale: Poor Sitting balance - Comments: MIN A for sitting balance with MOD- MAX A when spasms present     Standing balance-Leahy Scale: Zero Standing balance comment: unable to attempt                           ADL either performed or assessed with clinical judgement   ADL Overall ADL's : Needs assistance/impaired       Grooming Details (indicate cue type and reason): declined seated grooming, stated she had already completed tasks this AM   Upper Body Bathing Details (indicate cue type and reason): reports bed level baths at home       Upper Body Dressing Details (indicate cue type and reason): reports aid helps with dressing from bed level       Toilet Transfer Details (indicate cue type and reason): Pt does not get onto toilet at home       Tub/Shower Transfer Details (indicate cue type and reason): pt does not perform shower transfers at home Functional mobility during ADLs: Moderate assistance;Maximal assistance;+2 for physical assistance(bed mobility only) General ADL Comments: session focus on bed mobility and sitting balance. pt sat EOB ~ 6 mins     Vision Baseline Vision/History: No visual deficits Patient Visual Report: No change from baseline     Perception     Praxis  Cognition Arousal/Alertness: Awake/alert Behavior During Therapy: WFL for tasks assessed/performed Overall Cognitive Status: Within Functional Limits for tasks assessed                                 General Comments: pt pleasant and appreciative of session        Exercises     Shoulder Instructions       General Comments      Pertinent Vitals/ Pain       Pain Assessment: 0-10 Pain Score: 8  Pain Location:  legs and back Pain Descriptors / Indicators: Aching;Discomfort Pain Intervention(s): Limited activity within patient's tolerance;Monitored during session;Repositioned  Home Living                                          Prior Functioning/Environment              Frequency  Min 2X/week        Progress Toward Goals  OT Goals(current goals can now be found in the care plan section)  Progress towards OT goals: Progressing toward goals  Acute Rehab OT Goals Patient Stated Goal: to return home OT Goal Formulation: With patient Time For Goal Achievement: 02/11/19 Potential to Achieve Goals: Good  Plan Discharge plan remains appropriate    Co-evaluation    PT/OT/SLP Co-Evaluation/Treatment: Yes Reason for Co-Treatment: Complexity of the patient's impairments (multi-system involvement);For patient/therapist safety;To address functional/ADL transfers   OT goals addressed during session: ADL's and self-care      AM-PAC OT "6 Clicks" Daily Activity     Outcome Measure   Help from another person eating meals?: A Little Help from another person taking care of personal grooming?: A Little Help from another person toileting, which includes using toliet, bedpan, or urinal?: Total Help from another person bathing (including washing, rinsing, drying)?: Total Help from another person to put on and taking off regular upper body clothing?: Total Help from another person to put on and taking off regular lower body clothing?: Total 6 Click Score: 10    End of Session    OT Visit Diagnosis: Muscle weakness (generalized) (M62.81)   Activity Tolerance Patient tolerated treatment well   Patient Left in bed;with call bell/phone within reach;with nursing/sitter in room   Nurse Communication Mobility status        Time: [id number] OT Time Calculation (min): 32 min  Charges: OT General Charges $OT Visit: 1 Visit OT Treatments $Therapeutic Activity: 8-22  mins  Lanier Clam., COTA/L Acute Rehabilitation Services [phone]    Ihor Gully 01/30/2019, 11:11 AM

## 2019-01-30 NOTE — TOC Initial Note (Signed)
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 From home with fiance', Barbaraann Rondo. Supportive sister.  Pt would benefit from [facility] services ( RN,PT, NA) @ d/c. Pt agreeable to services. Choice offered and selected. Referral made with [facility] and accepted pending MD's orders.   MD pt will need orders and face to face for home health services.  PTAR transportation will need to be arranged for transportation to home.   TOC team will continue to monitor for needs ...  Expected Discharge Plan: [place] Barriers to Discharge: Continued Medical Work up   Patient Goals and CMS Choice Patient states their goals for this hospitalization and ongoing recovery are:: to get better and go home CMS Medicare.gov Compare Post Acute Care list provided to:: Patient Choice offered to / list presented to : Patient  Expected Discharge Plan and Services Expected Discharge Plan: [place]   Discharge Planning Services: CM Consult   Living arrangements for the past 2 months: Single Family Home       HH Arranged: RN, Nurse's Aide [place]: [facility] [employer] Date [place]: 01/29/19 Time [place]: [place] Representative spoke with at [place]: [place] Arrangements/Services Living arrangements for the past 2 months: [place] Lives with:: Significant Other Patient language and need for interpreter reviewed:: Yes Do you feel safe going back to the place where you live?: Yes      Need for Family Participation in Patient Care: Yes (Comment) Care giver support system in place?:  Yes (comment)   Criminal Activity/Legal Involvement Pertinent to Current Situation/Hospitalization: No - Comment as needed  Activities of Daily Living      Permission Sought/Granted Permission sought to share information with : Case Manager Permission granted to share information with : Yes, Verbal Permission Granted  Share Information with NAME: Kathaleen Grinder [facility]) [phone]           Emotional Assessment Appearance:: Appears stated age Attitude/Demeanor/Rapport: Engaged Affect (typically observed): Accepting Orientation: : Oriented to Self, Oriented to Place, Oriented to  Time, Oriented to Situation Alcohol / Substance Use: Not Applicable Psych Involvement: No (comment)  Admission diagnosis:  Pyelonephritis [N12] Neurogenic bladder [N31.9] Sepsis without acute organ dysfunction, due to unspecified organism [facility]) [A41.9] Patient Active Problem List   Diagnosis Date Noted  . Neurogenic bladder 01/27/2019  . Pyelonephritis 01/26/2019  . Paraplegia ([place]) 08/09/2018  . Back pain 06/17/2018  . OSA (obstructive sleep apnea) 06/17/2018  . Hypercalcemia 06/17/2018  . Anxiety 04/06/2017  . Acute respiratory failure with hypoxia ([place])   . Acute hypoxemic respiratory failure ([place]) 04/05/2017  . SIRS (systemic inflammatory response syndrome) ([place]) 04/05/2017  . Elevated troponin 04/05/2017  . Hyponatremia 04/05/2017  . Urgency of micturation 04/14/2015  . Dizziness 04/14/2015  . Neck pain 03/31/2015  . Abnormal uterine bleeding 08/29/2012  . Incarcerated ventral hernia 09/11/2010  . 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 LIPOMA 01/08/2006  . Hyperlipidemia 01/08/2006  . Obesity, morbid, BMI 50 or higher ([place]) 01/08/2006  . DEPRESSION 01/08/2006  . Essential hypertension 01/08/2006   PCP:  Deborah Chalk, FNP Pharmacy:   [id number] -  [address], [place] - [place] [place] [address] Phone: [phone] Fax:  [phone]     Social Determinants of Health (SDOH) Interventions    Readmission Risk Interventions No flowsheet data found.

## 2019-01-31 LAB — CULTURE, BLOOD (ROUTINE X 2)
Culture: NO GROWTH
Culture: NO GROWTH
Special Requests: ADEQUATE
Special Requests: ADEQUATE

## 2019-01-31 LAB — CBC
HCT: 26.2 % — ABNORMAL LOW (ref 36.0–46.0)
Hemoglobin: 8.3 g/dL — ABNORMAL LOW (ref 12.0–15.0)
MCH: 31 pg (ref 26.0–34.0)
MCHC: 31.7 g/dL (ref 30.0–36.0)
MCV: 97.8 fL (ref 80.0–100.0)
Platelets: 272 10*3/uL (ref 150–400)
RBC: 2.68 MIL/uL — ABNORMAL LOW (ref 3.87–5.11)
RDW: 12.2 % (ref 11.5–15.5)
WBC: 8 10*3/uL (ref 4.0–10.5)
nRBC: 0.3 % — ABNORMAL HIGH (ref 0.0–0.2)

## 2019-01-31 LAB — BASIC METABOLIC PANEL
Anion gap: 8 (ref 5–15)
BUN: 8 mg/dL (ref 6–20)
CO2: 26 mmol/L (ref 22–32)
Calcium: 10 mg/dL (ref 8.9–10.3)
Chloride: 107 mmol/L (ref 98–111)
Creatinine, Ser: 0.48 mg/dL (ref 0.44–1.00)
GFR calc Af Amer: >60 mL/min (ref >60)
GFR calc non Af Amer: >60 mL/min (ref >60)
Glucose, Bld: 112 mg/dL — ABNORMAL HIGH (ref 70–99)
Potassium: 4.4 mmol/L (ref 3.5–5.1)
Sodium: 141 mmol/L (ref 135–145)

## 2019-01-31 MED ORDER — CEFDINIR 300 MG PO CAPS: 300.0000 mg | ORAL_CAPSULE | Freq: Two times a day (BID) | ORAL | 0 refills | Status: AC

## 2019-01-31 MED ORDER — ACETAMINOPHEN 325 MG PO TABS: 650.0000 mg | ORAL_TABLET | Freq: Four times a day (QID) | ORAL | 0 refills | Status: AC | PRN

## 2019-01-31 NOTE — Discharge Summary (Signed)
Name: Ellen [name] MRN: [id number] DOB: 01/16/1961 58 y.o. PCP: Deborah Chalk, FNP  Date of Admission: 01/26/2019  6:07 PM Date of Discharge: 01/31/2019 Attending Physician: Axel Filler, *  Discharge Diagnosis: 1. Pyelonephritis 2. Dyspnea 2/2 OSA and OHS 3. Severe deconditioning  4. Paraplegia  Discharge Medications: Allergies as of 01/31/2019      Reactions   Codeine Itching   Only happened one time and did not occur last time patient was in the hospital. (??)      Medication List    TAKE these medications   acetaminophen 325 MG tablet Commonly known as: TYLENOL Take 2 tablets (650 mg total) by mouth every 6 (six) hours as needed for mild pain, fever or headache.   albuterol 108 (90 Base) MCG/ACT inhaler Commonly known as: VENTOLIN HFA Inhale 2 puffs into the lungs every 4 (four) hours as needed for wheezing or shortness of breath.   amLODipine 5 MG tablet Commonly known as: NORVASC Take 5 mg by mouth daily.   atorvastatin 20 MG tablet Commonly known as: LIPITOR Take 20 mg by mouth daily at 6 PM.   baclofen 10 MG tablet Commonly known as: LIORESAL Take 10 mg by mouth 3 (three) times daily as needed.   BL Adult Aspirin Low Strength 81 MG chewable tablet Generic drug: aspirin Chew 81 mg by mouth daily.   cefdinir 300 MG capsule Commonly known as: OMNICEF Take 1 capsule (300 mg total) by mouth 2 (two) times daily for 5 days.   celecoxib 100 MG capsule Commonly known as: CELEBREX Take 100 mg by mouth 2 (two) times daily.   diclofenac sodium 1 % Gel Commonly known as: VOLTAREN Apply 2-4 g topically every 2 (two) hours as needed (for bilateral knee pain).   docusate sodium 100 MG capsule Commonly known as: COLACE Take 1 capsule (100 mg total) by mouth 2 (two) times daily.   enalapril 20 MG tablet Commonly known as: VASOTEC Take 40 mg by mouth daily.   escitalopram 10 MG tablet Commonly known as: LEXAPRO Take 10 mg by mouth daily.   ferrous sulfate 325 (65 FE) MG tablet Take 325 mg by mouth daily with breakfast.   furosemide 20 MG tablet Commonly known as: LASIX Take 20 mg by mouth daily.   ipratropium 0.02 % nebulizer solution Commonly known as: ATROVENT Take 2.5 mLs (0.5 mg total) by nebulization every 6 (six) hours as needed for wheezing or shortness of breath.   levalbuterol 0.63 MG/3ML nebulizer solution Commonly known as: XOPENEX Take 3 mLs (0.63 mg total) by nebulization every 6 (six) hours as needed for wheezing or shortness of breath.   medroxyPROGESTERone 5 MG tablet Commonly known as: PROVERA Take 5 mg by mouth daily.   omeprazole 20 MG capsule Commonly known as: PRILOSEC Take 20 mg by mouth daily before breakfast.   oxybutynin 5 MG tablet Commonly known as: DITROPAN Take 5 mg by mouth 2 (two) times daily.   polyethylene glycol powder 17 GM/SCOOP powder Commonly known as: GLYCOLAX/MIRALAX Take 17 g by mouth daily as needed for mild constipation.   pregabalin 100 MG capsule Commonly known as: LYRICA Take 100 mg by mouth 3 (three) times daily.   senna 8.6 MG Tabs tablet Commonly known as: SENOKOT Take 1 tablet (8.6 mg total) by mouth 2 (two) times daily.   traMADol 50 MG tablet Commonly known as: ULTRAM Take 50 mg by mouth every 6 (six) hours as needed (for pain).   vitamin B-12  1000 MCG tablet Commonly known as: CYANOCOBALAMIN Take 1 tablet (1,000 mcg total) by mouth daily.   Vitamin D (Ergocalciferol) 1.25 MG (50000 UT) Caps capsule Commonly known as: DRISDOL Take 50,000 Units by mouth every 7 (seven) days.            Durable Medical Equipment  (From admission, onward)         Start     Ordered   01/31/19 0000  For home use only DME continuous positive airway pressure (CPAP)    Comments: Patient with OSA on CPAP at home. However, reports CPAP machine has been broken for one month. Significant history of OSA and OHS. Patient will need home CPAP machine.  Question Answer  Comment  Length of Need Lifetime   Patient has OSA or probable OSA Yes   Is the patient currently using CPAP in the home Yes   If yes (to question two) Determine DME provider and inform them of any new orders/settings   Date of face to face encounter 01/31/2019   Settings Autotitration   Signs and symptoms of probable OSA  (select all that apply) Snoring   Signs and symptoms of probable OSA  (select all that apply) Moring headaches   CPAP supplies needed Mask, headgear, cushions, filters, heated tubing and water chamber      01/31/19 0803          Disposition and follow-up:   [name] was discharged from [facility] in Stable condition.  At the hospital follow up visit please address:  1.  Pyelonephritis: Patient w/dysuria, fevers and chills with history of multiple UTIs found to have left pyelonephritis on CT scan. BCx negative and UCx inconclusive. Empirically treated w/ IV ceftriaxone with improvement. Pt discharged with Omnicef 300mg  bid for 5 days duration. Pt to f/u with PCP.   Dyspnea 2/2 OSA:  Pt w/ dyspnea due to OSA (home CPAP has not been functioning for 1 month). DME order placed for CPAP. Patient to f/u with PCP for continued management.   Severe deconditioning: Pt bedbound with severe deconditioning 2/2 paraplegia. Patient would benefit from home health PT/OT/RN/HA.   2.  Labs / imaging needed at time of follow-up: none  3.  Pending labs/ test needing follow-up: none  Follow-up Appointments: Follow-up Information    [facility] Follow up.   Specialty: Home Health Services Why: home health services arranged Contact information: [place] [address] [phone]        [facility] Follow up.   Why: Call Monday to have them come out to check your CPAP.  Contact information: [address] [place] [address] [phone]        Deborah Chalk, FNP. Schedule an appointment as soon as  possible for a visit in 1 week(s).   Specialty: Nurse Practitioner Contact information: [id number] [place]  [address] [place] Hospital Course by problem list: 1. Pyelonephritis: Pt presented with dysuria, fever and chills. Pt's last recorded UTI was in June, where her urine was positive for Morganella sensitive to cephalosporins. Pt was started on IV Ceftriaxone, blood and urine cultures were drawn, results were inconclusive. CT imaging of the abdomen revealed decreased contrast excretion in left kidney, supporting pyelonephritis diagnosis. Pt showed improvement on Ceftriaxone therapy. At discharge, pt was afebrile for over 24 hours and was prescribed Omnicef for 5 more days. Recommended  follow up appointment with pt's PCP.  2. Dyspnea 2/2 OSA Pt presented with one day of dyspnea. CXR was normal, Give her prn Atrovent on admission and d/c'd on 12/8. CT conducted on 12/9 showed low suspicion for abnormalities of the lung. Pt was seen by PT, OT and respiratory therapy. Their recommendations suggested that deconditioning and decrease lung volume due to body habitus were most likely contributors. During admission, pt would have episodes of orthopnea when attempting to sleep at night. It was found that pt's at-home CPAP machine had been unusable for 1 month's time prior to this admission, most likely contributing to her shortness of breath. Pt was placed on CPAP during admission. At discharge, pt's dyspnea had improved. Pt was encouraged to follow up with PCP to acquire a new CPAP machine.   3. Severe deconditioning Patient is paraplegic after her recent T11-T12 spinal decompression surgery in April 2020. She is morbidly obese and is mostly bed bound. As a result, patient has severe deconditioning and easily gets dyspneic on minimal exertion. Patient has home DME equipment but would benefit from home health services to help her with strength training and improve  functionality.   Discharge Vitals:   BP (!) 105/57   Pulse (!) 53   Temp (!) 97.5 F (36.4 C) (Oral)   Resp (!) 21   Ht 5\' 4"  (1.626 m)   Wt (!) 177.4 kg   LMP 07/11/2014   SpO2 99%   BMI 67.11 kg/m   Pertinent Labs, Studies, and Procedures:  CBC Latest Ref Rng & Units 01/31/2019 01/30/2019 01/29/2019  WBC 4.0 - 10.5 K/uL 8.0 9.6 10.8(H)  Hemoglobin 12.0 - 15.0 g/dL 8.3(L) 8.4(L) 8.6(L)  Hematocrit 36.0 - 46.0 % 26.2(L) 27.1(L) 26.9(L)  Platelets 150 - 400 K/uL 272 317 288   BMP Latest Ref Rng & Units 01/31/2019 01/30/2019 01/29/2019  Glucose 70 - 99 mg/dL 112(H) 156(H) 154(H)  BUN 6 - 20 mg/dL 8 10 8   Creatinine 0.44 - 1.00 mg/dL 0.48 0.73 0.61  Sodium 135 - 145 mmol/L 141 139 137  Potassium 3.5 - 5.1 mmol/L 4.4 4.3 4.2  Chloride 98 - 111 mmol/L 107 107 107  CO2 22 - 32 mmol/L 26 25 22   Calcium 8.9 - 10.3 mg/dL 10.0 9.6 9.3   Urinalysis    Component Value Date/Time   COLORURINE YELLOW 01/26/2019 2111   APPEARANCEUR CLOUDY (A) 01/26/2019 2111   LABSPEC 1.012 01/26/2019 2111   PHURINE 6.0 01/26/2019 2111   GLUCOSEU NEGATIVE 01/26/2019 2111   GLUCOSEU NEG mg/dL 08/05/2006 2022   HGBUR SMALL (A) 01/26/2019 2111   [place] NEGATIVE 01/26/2019 2111   [place] NEGATIVE 01/26/2019 2111   PROTEINUR 100 (A) 01/26/2019 2111   UROBILINOGEN 1.0 06/14/2011 1828   NITRITE NEGATIVE 01/26/2019 2111   LEUKOCYTESUR LARGE (A) 01/26/2019 2111    CHEST X-RAY 01/26/2019:  IMPRESSION: Accounting for body habitus, the lungs are clear.  RENAL US 01/27/2019:  IMPRESSION: 1. Technically limited exam due to habitus and difficulty with patient positioning. The left kidney is not visualized. 2. No right hydronephrosis or acute findings. 3. Bilaterally minimally distended and not well evaluated.  CT CHEST/ABDOMEN/PELVIS W CONTRAST 01/29/2019: IMPRESSION: Examination limited secondary to beam hardening artifacts related to body habitus. Patchy LEFT nephrogram with delayed excretion  of contrast on delayed images likely representing pyelonephritis; recommend correlation with urinalysis. Dependent bibasilar atelectasis with questionable patchy infiltrates in the lower lobes bilaterally suspicious for pneumonia. Supraumbilical ventral hernia containing fat.   Discharge Instructions: Discharge Instructions  Call MD for:  difficulty breathing, headache or visual disturbances   Complete by: As directed    Call MD for:  extreme fatigue   Complete by: As directed    Call MD for:  hives   Complete by: As directed    Call MD for:  persistant dizziness or light-headedness   Complete by: As directed    Call MD for:  persistant nausea and vomiting   Complete by: As directed    Call MD for:  severe uncontrolled pain   Complete by: As directed    Call MD for:  temperature >100.4   Complete by: As directed    Diet - low sodium heart healthy   Complete by: As directed    Discharge instructions   Complete by: As directed    Ms. Ellen [name],  You were admitted with fevers, chills and trouble urinating and found to have a kidney infection. You were treated with IV antibiotics with improvement in your symptoms. On discharge, please continue to take the prescribed antibiotic twice daily for 5 days. You may also take tylenol for any break through fevers.  For your back spasms, we recommend continuing to take baclofen and lyrica as prescribed by your PCP. Please follow up with your PCP and neurosurgeon for further evaluation and management.   Take care!   For home use only DME continuous positive airway pressure (CPAP)   Complete by: As directed    Patient with OSA on CPAP at home. However, reports CPAP machine has been broken for one month. Significant history of OSA and OHS. Patient will need home CPAP machine.   Length of Need: Lifetime   Patient has OSA or probable OSA: Yes   Is the patient currently using CPAP in the home: Yes   If yes (to question two): Determine DME  provider and inform them of any new orders/settings   Date of face to face encounter: 01/31/2019   Settings: Autotitration   Signs and symptoms of probable OSA  (select all that apply):  Snoring Moring headaches     CPAP supplies needed: Mask, headgear, cushions, filters, heated tubing and water chamber   Increase activity slowly   Complete by: As directed       Signed: Harvie Heck MD Internal Medicine, PGY-1 01/31/2019, 10:10 AM   Pager: [phone]

## 2019-01-31 NOTE — Progress Notes (Signed)
Patient being discharged home. Patient educated on medications, making follow up appts and when to call 911. Patient verbalized understanding. Patient discharged home via [place]. Patient discharged with personal belongings.

## 2019-01-31 NOTE — TOC Transition Note (Addendum)
Transition of Care [facility]) - CM/SW Discharge Note   Patient Details  Name: Ellen [name] MRN: [id number] Date of Birth: January 07, 1961  Transition of Care [facility]) CM/SW Contact:  Carles Collet, RN Phone Number: 01/31/2019, 9:02 AM   Clinical Narrative:    Notified [place] liaison of DC today. Spoke w patient over the phone, she would like DC around 12:00, has someone at home to let her in, verified DC address,  notified her I updated [place] that she will DC today. Spoke w nurse, she agreed upon 12:00 DC time for PTAR, notified her I printed out PTAR forms to unit printer. [facility] called for transportation at 12:00.   Patient states she dropped ger C PAP and it broke. I have added [employer]'s number for her to call Monday. SHe stated she understood and will them Monday to replace unit. She states that she does not use it everyday. Also notified [facility] liaison to have [facility] RN follow up on this with patient when she has her first [facility] visit.     Final next level of care: [place] Barriers to Discharge: No Barriers Identified   Patient Goals and CMS Choice Patient states their goals for this hospitalization and ongoing recovery are:: to get better and go home CMS Medicare.gov Compare Post Acute Care list provided to:: Patient Choice offered to / list presented to : Patient  Discharge Placement                       Discharge Plan and Services   Discharge Planning Services: CM Consult                      HH Arranged: RN, PT, OT, Nurse's Aide [place] Agency: [facility] ([facility] [employer]) Date [place]: 01/31/19 Time [place]: [phone] Representative spoke with at [place]: [place] ([place]) Interventions     Readmission Risk Interventions No flowsheet data found.

## 2019-01-31 NOTE — Progress Notes (Addendum)
   Subjective:  No acute events overnight. This morning pt reports that she has not had any fevers. Pt does continue to endorse back pain and also had a mild headache this morning. Pt otherwise feels well and is prepared to go home.  Objective:  Vital signs in last 24 hours: Vitals:   01/30/19 0747 01/30/19 1700 01/30/19 2300 01/31/19 0134  BP: 127/71 128/68 127/61   Pulse: 90  74 79  Resp:   16 (!) 21  Temp: 99.2 F (37.3 C) 98.9 F (37.2 C) 98.7 F (37.1 C)   TempSrc: Oral Oral Oral   SpO2: 95% 95% 99% 98%  Weight:      Height:       Weight change:   Intake/Output Summary (Last 24 hours) at 01/31/2019 0659 Last data filed at 01/31/2019 [id number] Gross per 24 hour  Intake 240 ml  Output 4650 ml  Net -4410 ml   Physical Exam Constitutional:      General: She is not in acute distress.    Appearance: She is obese.  HENT:     Head: Normocephalic and atraumatic.     Mouth/Throat:     Mouth: Mucous membranes are moist.  Eyes:     General: No scleral icterus.    Conjunctiva/sclera: Conjunctivae normal.  Skin:    General: Skin is warm and dry.  Neurological:     General: No focal deficit present.     Mental Status: She is alert. Mental status is at baseline.  Psychiatric:        Mood and Affect: Mood normal.    Labs: Glucose 112 Hgb 8.3, Hct 26.2  Respiratory viral panel: Negative  Assessment/Plan:  Principal Problem:   Pyelonephritis Active Problems:   Arthritis of both knees   Back pain   OSA (obstructive sleep apnea)   Paraplegia (HCC)   Neurogenic bladder  Ellen [name] is a 58yo female with PMHofmultiple UTIs,urinary incontinence,OSA, paraplegiaandmorbid obesitypresenting witha 2 day history ofdysuria, fever, and chills. Pt has been afebrile and clinically stable for over 24 hours. CT findings support initial diagnosis of pyelonephritis.  Neurogenic bladder Pyelonephritis: Pt is afebrile and clinically stable, ready for discharge - Blood  cultures negative to date and urine cx inconclusive -Discharge with Omnicef 300mg  bid for 5 days to complete 10 day course of antibiotics  - Schedule Tylenol 650 mg q6hr for fever  Shortness of Breath/OSA: Pt had no dypnea overnight. Pt reports that CPAP fell off again last night. Headache is most likely attributable to OSA.  - continue home atrovent prn - Continue home CPAP use -Greatly appreciate Respiratory therapy recs - Pt could benefit from f/u with PCP about new CPAP machine - Schedule Tylenol 650 mg q6hr for headache  Paraplegia Arthritis of knees, back pain: Pain is well-controlled. - Continue baclofen 10 mg, three times daily & Tylenol 650 mg q6h for pain - Physical therapy on board, greatly appreciate recs     LOS: 4 days   Carin Primrose, Medical Student 01/31/2019, 6:59 AM  Attestation for Student Documentation:  I personally was present and performed or re-performed the history, physical exam and medical decision-making activities of this service and have verified that the service and findings are accurately documented in the student's note.  Harvie Heck, MD 01/31/2019, 3:42 PM

## 2019-02-03 LAB — CULTURE, BLOOD (ROUTINE X 2)
Culture: NO GROWTH
Culture: NO GROWTH

## 2019-03-04 ENCOUNTER — Ambulatory Visit: Payer: Medicare HMO | Admitting: Rehabilitation

## 2019-03-16 ENCOUNTER — Ambulatory Visit: Payer: Medicare HMO | Admitting: Physical Therapy

## 2019-03-27 ENCOUNTER — Other Ambulatory Visit: Payer: Self-pay

## 2019-03-27 ENCOUNTER — Ambulatory Visit: Payer: Medicare HMO | Attending: Physician Assistant

## 2019-03-27 DIAGNOSIS — R278 Other lack of coordination: Secondary | ICD-10-CM

## 2019-03-27 DIAGNOSIS — R208 Other disturbances of skin sensation: Secondary | ICD-10-CM

## 2019-03-27 DIAGNOSIS — G8222 Paraplegia, incomplete: Secondary | ICD-10-CM | POA: Diagnosis present

## 2019-03-27 DIAGNOSIS — R2689 Other abnormalities of gait and mobility: Secondary | ICD-10-CM | POA: Insufficient documentation

## 2019-03-27 DIAGNOSIS — R209 Unspecified disturbances of skin sensation: Secondary | ICD-10-CM | POA: Insufficient documentation

## 2019-03-27 NOTE — Therapy (Signed)
[facility] [address] [place], [place], [address] Phone: [phone]   Fax:  [phone]  Physical Therapy Evaluation  Patient Details  Name: Ellen [name] MRN: [id number] Date of Birth: 03/01/1960 Referring Provider (PT): Ferne Reus, [place]   Encounter Date: 03/27/2019  PT End of Session - 03/27/19 2030    Visit Number  1    Number of Visits  17    Date for PT Re-Evaluation  06/25/19    Authorization Type  [employer] Medicare primary and Medicaid secondary-awaitng auth    PT Start Time  1454    PT Stop Time  1620    PT Time Calculation (min)  86 min    Equipment Utilized During Treatment  Other (comment)   Hoyer lift   Activity Tolerance  Patient tolerated treatment well    Behavior During Therapy  [facility] for tasks assessed/performed       Past Medical History:  Diagnosis Date  . Bronchitis    hx of  . Degenerative joint disease   . Depression   . ENDOMETRIAL POLYP 01/08/2006   Dr. Agnes Lawrence.  2001:  D&C, benign;  07/14/10:  D&C, hysteroscopy, polypectomy and bx of mons pubis (peau d'orange)...... 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  Marland Kitchen GERD (gastroesophageal reflux disease)   . Hernia   . High cholesterol   . Hypertension   . Lipoma   . Obesity   . Sleep apnea    uses cpap    Past Surgical History:  Procedure Laterality Date  . CHOLECYSTECTOMY    . DILATION AND CURETTAGE OF UTERUS    . LUMBAR LAMINECTOMY/DECOMPRESSION MICRODISCECTOMY N/A 06/19/2018   Procedure: THORACIC TEN-THORACIC ELEVEN  LAMINECTOMY;  Surgeon: Consuella Lose, MD;  Location: [facility];  Service: Neurosurgery;  Laterality: N/A;  posterior  . resection of lipoma    . UMBILICAL HERNIA REPAIR      There were no vitals filed for this visit.   Subjective Assessment - 03/27/19 1501    Subjective  Pt with hx of advanced OA of thoracic spine which affected her walking, therefore, pt had laminectomy  and decompression on 06/19/2018, with pt reporting paraplegia began after surgery. Pt told that paraplegia could last a year by neurosurgeon per pt. Pt d/c to SNF on 06/27/18 rehab after surgery and the had two HHPT visits, pt reported HHPT d/c pt because they stated pt did not demonstrate improvements. Pt chief c/o is paraplegia and spasticity. Pt presented in manual w/c but was fearful of falling out, as she typically is bed bound and does not sit up in w/c. Pt is seeing neurosurgery but not a neurologist or physical med and rehab at this time. Pt has a nurse daily, from 8am-10am. Pt's fiance is available to help at all times. Pt with frequent ED visits for UTI with recent hospitalization on 01/26/19-01/31/20.    Pertinent History  HTN, advanced OA, s/p laminectomy and decompression of T11-12 on 06/19/18 with paraplegia, HLD, depression, anxiety, morbid obesity, DDD, OSA with CPAP ordered per pt, neurogenic bladder    Patient Stated Goals  To move better    Currently in Pain?  Yes    Pain Score  10-Worst pain ever    Pain Location  Leg    Pain Orientation  Left;Right    Pain Descriptors / Indicators  Aching    Pain Type  Neuropathic pain    Pain Radiating Towards  both LEs    Pain Onset  More than a month ago    Pain Frequency  Constant    Aggravating Factors   spasticity    Pain Relieving Factors  Baclofen         [facility] PT Assessment - 03/27/19 1521      Assessment   Medical Diagnosis  Paraplegia    Referring Provider (PT)  Ferne Reus, PA-C    Onset Date/Surgical Date  06/19/19   with LE beginning prior to surgery   Hand Dominance  Left    Next MD Visit  Pt's PCP comes to pt's home and she reported appt is next week (03/30/19)    Prior Therapy  SNF and HHPT      Precautions   Precautions  Fall;Other (comment)    Precaution Comments  Monitor pt's skin integrity (R flank 2/2 small tears in skin from being belted into w/c)      Restrictions   Weight Bearing Restrictions  No       Balance Screen   Has the patient fallen in the past 6 months  No    Has the patient had a decrease in activity level because of a fear of falling?   Yes    Is the patient reluctant to leave their home because of a fear of falling?   Yes      [place]  Private residence    Living Arrangements  Spouse/significant other    Available Help at Discharge  Family;Available 24 hours/day    Type of Home  House    Home Access  Level entry    Home Layout  One level    Home Equipment  Wheelchair - manual;Hospital bed;Other (comment);Bedside commode   hoyer lift   Additional Comments  Pt takes sponge bath      Prior Function   Level of Independence  Independent    Vocation  On disability    Leisure  "I miss doing everything", Walking, combing kid's (nieces) hair as they cannot fit onto hospital bed      Cognition   Overall Cognitive Status  Within Functional Limits for tasks assessed      Observation/Other Assessments   Skin Integrity  Pt noted to have small tears on R torso (flank area) 2/2 belt rubbing skin raw when belted into w/c 2/2 incr. spasticity causing pt to slide out of w/c. w/c also not a proper fit for pt      Observation/Other Assessments-Edema    Edema  --   BLE edema noted, not measured     Sensation   Light Touch  Impaired by gross assessment   pt reported some light touch felt in LLE   Additional Comments  Not able to feel any sensation in BLE, except for small spot on lower part of LLE upon first sensation test in seated. Pt then had eye closed and was able to indicated which LE and where PT was touching, (L upper and lower portion of LE and R lower portion of LE but had  could not detect light touch on R thigh)      Coordination   Gross Motor Movements are Fluid and Coordinated  No    Fine Motor Movements are Fluid and Coordinated  No      Posture/Postural Control   Posture/Postural Control  Postural limitations    Posture Comments  Pt  presented with posterior pelvic tilt in w/c and BLE in ER 2/2 incr. spasticity.  Tone   Assessment Location  Right Lower Extremity;Left Lower Extremity      ROM / Strength   AROM / PROM / Strength  AROM;Strength      AROM   Overall AROM   Deficits    Overall AROM Comments  BUE AROM WFL      Strength   Overall Strength  Deficits    Overall Strength Comments  BUE grossly WFL with pt unable to move BLE. PT did not detect and trace muscle contractions in BLE during B hip flex, knee flex, knee ext, or dorsiflexion attempts.       Bed Mobility   Bed Mobility  Rolling Right;Rolling Left;Sit to Supine;Supine to Sit    Rolling Right  2 Helpers;Total Assistance - Patient < 25%   cues for initiation, +2 for trunk and LE management   Rolling Left  Total Assistance - Patient < 25%;2 Helpers   cues for initiation, +2 for trunk and LE management   Supine to Sit  Dependent - mechanical lift;2 Helpers    Sit to Supine  Dependent - mechanical lift;2 Helpers      Transfers   Transfers  --    Comments  PT's utilized [employer] lift for w/c<>mat txfs and pt reported lift is also used for all txfs at home. 4 people required to ensure proper placement in w/c 2/2 incr. body habitus, incr. spasticity, and ill-fitting w/c.       Ambulation/Gait   Ambulation/Gait  No      Administrator, arts  Other (comment)   pt unable to use BUE/LE to propel w/c-dependent     Balance   Balance Assessed  No      RLE Tone   RLE Tone  Modified Ashworth      RLE Tone   Modified Ashworth Scale for Grading Hypertonia RLE  Considerable increase in muschle tone, passive movement difficult      LLE Tone   LLE Tone  Modified Ashworth      LLE Tone   Modified Ashworth Scale for Grading Hypertonia LLE  Considerable increase in muschle tone, passive movement difficult                Objective measurements completed on examination: See above findings.               PT Education - 03/27/19 2026    Education Details  PT educated pt on POC, frequency, and duration. PT discussed referral to PM&R to establish care for paraplegia and spasticity. PT encouraged pt to notify nurse of skin breakdown on R side-flank area 2/2 belted into w/c. PT encouraged pt to not have belt so tight and to use belt tied above knees when in w/c vs. tightly wrapped around B ankles to improve skin integrity. Pt educated about proper positioning in w/c but that pt will require a new w/c (power) that fits properly and allows repositioning and more IND.    Person(s) Educated  Patient    Methods  Explanation;Demonstration;Verbal cues;Tactile cues    Comprehension  Verbalized understanding       PT Short Term Goals - 03/27/19 2039      PT SHORT TERM GOAL #1   Title  Pt will perform HEP with caregiver assist in order to improve balance, strength, and flexibility. TARGET DATE FOR ALL STGS: 04/24/19    Baseline  No HEP    Time  4  Period  Weeks    Status  New      PT SHORT TERM GOAL #2   Title  Pt will perform rolling to R and L side with max A to improve functional mobility.    Baseline  +2 total assist    Time  4    Period  Weeks    Status  New      PT SHORT TERM GOAL #3   Title  Perform sitting balance assessment and write goals as indicated.    Baseline  Not yet performed.    Time  4    Period  Weeks    Status  New      PT SHORT TERM GOAL #4   Title  Pt will have PM&R and w/c eval appt's made to manage tone, improve safety in w/c, and IND.    Time  4    Period  Weeks    Status  New        PT Long Term Goals - 03/27/19 2042      PT LONG TERM GOAL #1   Title  Pt will perform rolling from R and L side with mod A to improve functional mobility. TARGET DATE FOR ALL LTGS: 04/21/19    Baseline  + 2 assist    Time  8    Period  Weeks    Status  New      PT LONG TERM GOAL #2   Title  Pt will perform supine<>sit with HOB elevated with max A to  improve functional mobility.    Baseline  Dependent on Hoyer lift    Time  8    Period  Weeks    Status  New      PT LONG TERM GOAL #3   Title  Write goal for w/c mobility once pt has new w/c    Baseline  Dependent in w/c propulsion in manual w/c    Time  8    Period  Weeks    Status  New             Plan - 03/27/19 2031    Clinical Impression Statement  Pt is a pleasant 59 y/o female presenting to [facility] neuro s/p laminectomy and decompression on 06/19/18, and now has paraplegia. Pt's PMH is significant for the following: HTN, advanced OA, s/p laminectomy and decompression of T11-12 on 06/19/18 with paraplegia, HLD, depression, anxiety, morbid obesity, DDD, OSA with CPAP ordered per pt, neurogenic bladder. Pt is completely dependent in w/c mobility and txfs-requires Hoyer lift. PT will formally assess sitting balance next session 2/2 time constraints today. The following deficits were noted during exam: spasticity, muscle weakness (paraplegia), decr. sensation, impaired flexibility, impaired balance, unable to attempt amb. 2/2 paraplegia, and pain. Pt would benefit from skilled PT to improve safety and IND during functional mobillity.    Personal Factors and Comorbidities  Comorbidity 3+;Past/Current Experience;Time since onset of injury/illness/exacerbation;Transportation    Comorbidities  HTN, advanced OA, s/p laminectomy and decompression of T11-12 on 06/19/18 with paraplegia, HLD, depression, anxiety, morbid obesity, DDD, OSA with CPAP ordered per pt, neurogenic bladder    Examination-Activity Limitations  Bathing;Bed Mobility;Sleep;Sit;Bend;Squat;Caring for Others;Stairs;Stand;Carry;Continence;Toileting;Transfers;Dressing;Hygiene/Grooming;Lift;Locomotion Level    Examination-Participation Restrictions  Church;Driving;Community Activity;Interpersonal Relationship;Laundry;Meal Prep    Stability/Clinical Decision Making  Evolving/Moderate complexity    Clinical Decision Making  Moderate     Rehab Potential  Good    PT Frequency  2x / week    PT Duration  8 weeks  PT Treatment/Interventions  ADLs/Self Care Home Management;Biofeedback;Lobbyist;Therapeutic exercise;Manual techniques;Vestibular;Wheelchair mobility training;Therapeutic activities;Functional mobility training;Orthotic Fit/Training;Stair training;Gait training;DME Instruction;Patient/family education;Neuromuscular re-education    PT Next Visit Plan  Assess sitting balance, initiate HEP (sitting balance as indicated, stretching), f/u to see if pt able to obtain PCP information so we can refer to PM&R and for w/c eval. Assess skin integrity on R side (flank area)    Consulted and Agree with Plan of Care  Patient       Patient will benefit from skilled therapeutic intervention in order to improve the following deficits and impairments:  Decreased endurance, Decreased skin integrity, Impaired sensation, Obesity, Impaired tone, Pain, Decreased strength, Decreased knowledge of use of DME, Increased edema, Decreased balance, Decreased mobility, Difficulty walking, Postural dysfunction, Impaired flexibility, Decreased coordination  Visit Diagnosis: Other abnormalities of gait and mobility - Plan: PT plan of care cert/re-cert  Paraplegia, incomplete ([place]) - Plan: PT plan of care cert/re-cert  Other disturbances of skin sensation - Plan: PT plan of care cert/re-cert  Other lack of coordination - Plan: PT plan of care cert/re-cert     Problem List Patient Active Problem List   Diagnosis Date Noted  . Neurogenic bladder 01/27/2019  . Pyelonephritis 01/26/2019  . Paraplegia ([place]) 08/09/2018  . Back pain 06/17/2018  . OSA (obstructive sleep apnea) 06/17/2018  . Hypercalcemia 06/17/2018  . Anxiety 04/06/2017  . Acute respiratory failure with hypoxia ([place])   . Acute hypoxemic respiratory failure ([place]) 04/05/2017  . SIRS (systemic inflammatory response syndrome) ([place]) 04/05/2017  . Elevated  troponin 04/05/2017  . Hyponatremia 04/05/2017  . Urgency of micturation 04/14/2015  . Dizziness 04/14/2015  . Neck pain 03/31/2015  . Abnormal uterine bleeding 08/29/2012  . Incarcerated ventral hernia 09/11/2010  . 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 LIPOMA 01/08/2006  . Hyperlipidemia 01/08/2006  . Obesity, morbid, BMI 50 or higher ([place]) 01/08/2006  . DEPRESSION 01/08/2006  . Essential hypertension 01/08/2006    Judith Campillo L 03/27/2019, 8:47 PM  [facility] [address] [place], [place], [address] Phone: [phone]   Fax:  [phone]  Name: Ellen [name] MRN: [id number] Date of Birth: December 14, 1960  Geoffry Paradise, PT,DPT 03/27/19 8:48 PM Phone: [phone] Fax: [phone]

## 2019-04-02 ENCOUNTER — Ambulatory Visit: Payer: Medicare HMO | Admitting: Physical Therapy

## 2019-04-06 ENCOUNTER — Ambulatory Visit: Payer: Medicare HMO | Admitting: Physical Therapy

## 2019-04-10 ENCOUNTER — Ambulatory Visit: Payer: Medicare HMO | Admitting: Physical Therapy

## 2019-04-16 ENCOUNTER — Ambulatory Visit: Payer: Medicare HMO

## 2019-04-16 ENCOUNTER — Other Ambulatory Visit: Payer: Self-pay

## 2019-04-16 NOTE — Therapy (Signed)
[facility] [address] [place], [place], [address] Phone: [phone]   Fax:  [phone]  Physical Therapy Treatment  Patient Details  Name: Ellen [name] MRN: [id number] Date of Birth: 1961/01/08 Referring Provider (PT): Ferne Reus, [place]   Encounter Date: 04/16/2019  PT End of Session - 04/16/19 1609    Visit Number  1   no charge for today   Number of Visits  17    Date for PT Re-Evaluation  06/25/19    Authorization Type  [employer] Medicare primary and Medicaid secondary-awaitng auth    PT Start Time  1538    PT Stop Time  1552    PT Time Calculation (min)  14 min    Equipment Utilized During Treatment  --   Hoyer lift   Behavior During Therapy  WFL for tasks assessed/performed       Past Medical History:  Diagnosis Date  . Bronchitis    hx of  . Degenerative joint disease   . Depression   . ENDOMETRIAL POLYP 01/08/2006   Dr. Agnes Lawrence.  2001:  D&C, benign;  07/14/10:  D&C, hysteroscopy, polypectomy and bx of mons pubis (peau d'orange)...... 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  Marland Kitchen GERD (gastroesophageal reflux disease)   . Hernia   . High cholesterol   . Hypertension   . Lipoma   . Obesity   . Sleep apnea    uses cpap    Past Surgical History:  Procedure Laterality Date  . CHOLECYSTECTOMY    . DILATION AND CURETTAGE OF UTERUS    . LUMBAR LAMINECTOMY/DECOMPRESSION MICRODISCECTOMY N/A 06/19/2018   Procedure: THORACIC TEN-THORACIC ELEVEN  LAMINECTOMY;  Surgeon: Consuella Lose, MD;  Location: [facility];  Service: Neurosurgery;  Laterality: N/A;  posterior  . resection of lipoma    . UMBILICAL HERNIA REPAIR      There were no vitals filed for this visit.  Subjective Assessment - 04/16/19 1607    Subjective  Pt denied changes since last session. Pt forgot to bring PCP information. Pt presenting with new w/c belt around waist and actual belts tied around B  ankles and foot rests 2/2 incr. BLE spasticity.    Pertinent History  HTN, advanced OA, s/p laminectomy and decompression of T11-12 on 06/19/18 with paraplegia, HLD, depression, anxiety, morbid obesity, DDD, OSA with CPAP ordered per pt, neurogenic bladder    Patient Stated Goals  To move better    Currently in Pain?  Yes   10/10 due to BLE spasticity   Pain Score  10-Worst pain ever    Pain Location  Leg    Pain Orientation  Left;Right    Pain Descriptors / Indicators  Aching    Pain Type  Neuropathic pain    Pain Onset  More than a month ago    Pain Frequency  Constant    Aggravating Factors   spasticity    Pain Relieving Factors  Baclofen                                 PT Short Term Goals - 03/27/19 2039      PT SHORT TERM GOAL #1   Title  Pt will perform HEP with caregiver assist in order to improve balance, strength, and flexibility. TARGET DATE FOR ALL STGS: 04/24/19    Baseline  No HEP    Time  4  Period  Weeks    Status  New      PT SHORT TERM GOAL #2   Title  Pt will perform rolling to R and L side with max A to improve functional mobility.    Baseline  +2 total assist    Time  4    Period  Weeks    Status  New      PT SHORT TERM GOAL #3   Title  Perform sitting balance assessment and write goals as indicated.    Baseline  Not yet performed.    Time  4    Period  Weeks    Status  New      PT SHORT TERM GOAL #4   Title  Pt will have PM&R and w/c eval appt's made to manage tone, improve safety in w/c, and IND.    Time  4    Period  Weeks    Status  New        PT Long Term Goals - 03/27/19 2042      PT LONG TERM GOAL #1   Title  Pt will perform rolling from R and L side with mod A to improve functional mobility. TARGET DATE FOR ALL LTGS: 04/21/19    Baseline  + 2 assist    Time  8    Period  Weeks    Status  New      PT LONG TERM GOAL #2   Title  Pt will perform supine<>sit with HOB elevated with max A to improve functional  mobility.    Baseline  Dependent on Hoyer lift    Time  8    Period  Weeks    Status  New      PT LONG TERM GOAL #3   Title  Write goal for w/c mobility once pt has new w/c    Baseline  Dependent in w/c propulsion in manual w/c    Time  8    Period  Weeks    Status  New            Plan - 04/16/19 1610    Clinical Impression Statement  No charge for today, as PT is referring pt back to homehealth until pt is seen by physical medicine and rehab, and has a power w/c evaluation at our clinic. Pt forgot to bring in PCP information but will call back with that information so we can request referral for power w/c eval, HHPT and home health nursing to assess wounds, and PM&R. Once pt is medically managed then PCP can refer pt back to [facility] neuro.    Personal Factors and Comorbidities  Comorbidity 3+;Past/Current Experience;Time since onset of injury/illness/exacerbation;Transportation    Comorbidities  HTN, advanced OA, s/p laminectomy and decompression of T11-12 on 06/19/18 with paraplegia, HLD, depression, anxiety, morbid obesity, DDD, OSA with CPAP ordered per pt, neurogenic bladder    Examination-Activity Limitations  Bathing;Bed Mobility;Sleep;Sit;Bend;Squat;Caring for Others;Stairs;Stand;Carry;Continence;Toileting;Transfers;Dressing;Hygiene/Grooming;Lift;Locomotion Level    Examination-Participation Restrictions  Church;Driving;Community Activity;Interpersonal Relationship;Laundry;Meal Prep    Stability/Clinical Decision Making  Evolving/Moderate complexity    Rehab Potential  Good    PT Frequency  2x / week    PT Duration  8 weeks    PT Treatment/Interventions  ADLs/Self Care Home Management;Biofeedback;Lobbyist;Therapeutic exercise;Manual techniques;Vestibular;Wheelchair mobility training;Therapeutic activities;Functional mobility training;Orthotic Fit/Training;Stair training;Gait training;DME Instruction;Patient/family education;Neuromuscular re-education     PT Next Visit Plan  Hold until pt has PM&R eval, HHPT and nursing to manage wounds and tfxs, streteching, seated  balance, and power w/c eval    Consulted and Agree with Plan of Care  Patient       Patient will benefit from skilled therapeutic intervention in order to improve the following deficits and impairments:  Decreased endurance, Decreased skin integrity, Impaired sensation, Obesity, Impaired tone, Pain, Decreased strength, Decreased knowledge of use of DME, Increased edema, Decreased balance, Decreased mobility, Difficulty walking, Postural dysfunction, Impaired flexibility, Decreased coordination  Visit Diagnosis: Paraplegia, incomplete [facility])     Problem List Patient Active Problem List   Diagnosis Date Noted  . Neurogenic bladder 01/27/2019  . Pyelonephritis 01/26/2019  . Paraplegia ([place]) 08/09/2018  . Back pain 06/17/2018  . OSA (obstructive sleep apnea) 06/17/2018  . Hypercalcemia 06/17/2018  . Anxiety 04/06/2017  . Acute respiratory failure with hypoxia ([place])   . Acute hypoxemic respiratory failure ([place]) 04/05/2017  . SIRS (systemic inflammatory response syndrome) ([place]) 04/05/2017  . Elevated troponin 04/05/2017  . Hyponatremia 04/05/2017  . Urgency of micturation 04/14/2015  . Dizziness 04/14/2015  . Neck pain 03/31/2015  . Abnormal uterine bleeding 08/29/2012  . Incarcerated ventral hernia 09/11/2010  . 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 LIPOMA 01/08/2006  . Hyperlipidemia 01/08/2006  . Obesity, morbid, BMI 50 or higher ([place]) 01/08/2006  . DEPRESSION 01/08/2006  . Essential hypertension 01/08/2006    Adrianah Prophete L 04/16/2019, 4:26 PM   Geoffry Paradise, PT,DPT 04/16/19 4:26 PM Phone: [phone] Fax: [facility] [address] [place], [place], [address] Phone: [phone]   Fax:  [phone]  Name: MAREDITH SLAVICK MRN: [id number] Date of Birth: 1960/12/05

## 2019-04-17 ENCOUNTER — Telehealth: Payer: Self-pay

## 2019-04-17 NOTE — Telephone Encounter (Signed)
Dr. Daphene Jaeger,  We feel Ellen [name] would benefit from a power w/c evaluation order (to our clinic-[facility] neurore[facility]), a referral to physical medicine and rehab to address spasticity, and referral back to home health PT to address bed mobility, stretches, and seated balance. Once, she completes those three things, we would see her back at outpatient. As of right now it is too challenging for her to get to out clinic, she has wounds from being strapped into her w/c 2/2 severe spasticity limiting her function and ability to perform any movement. If you agree, please send a referral to our clinic for a power w/c eval, one to physical medicine and rehab, and to home health PT.  Thank you, Geoffry Paradise, PT,DPT 04/17/19 11:40 AM Phone: [phone] Fax: [phone]

## 2019-04-18 ENCOUNTER — Emergency Department (HOSPITAL_COMMUNITY): Payer: Medicare HMO

## 2019-04-18 ENCOUNTER — Emergency Department (HOSPITAL_COMMUNITY)
Admission: EM | Admit: 2019-04-18 | Discharge: 2019-04-19 | Disposition: A | Discharge status: Home / Self Care | Payer: Medicare HMO | Attending: Emergency Medicine | Admitting: Emergency Medicine

## 2019-04-18 ENCOUNTER — Encounter (HOSPITAL_COMMUNITY): Payer: Self-pay | Admitting: Emergency Medicine

## 2019-04-18 ENCOUNTER — Other Ambulatory Visit: Payer: Self-pay

## 2019-04-18 DIAGNOSIS — K760 Fatty (change of) liver, not elsewhere classified: Secondary | ICD-10-CM | POA: Diagnosis not present

## 2019-04-18 DIAGNOSIS — J984 Other disorders of lung: Secondary | ICD-10-CM | POA: Insufficient documentation

## 2019-04-18 DIAGNOSIS — Z20822 Contact with and (suspected) exposure to covid-19: Secondary | ICD-10-CM | POA: Insufficient documentation

## 2019-04-18 DIAGNOSIS — R0602 Shortness of breath: Secondary | ICD-10-CM | POA: Diagnosis present

## 2019-04-18 DIAGNOSIS — I10 Essential (primary) hypertension: Secondary | ICD-10-CM | POA: Insufficient documentation

## 2019-04-18 DIAGNOSIS — Z79899 Other long term (current) drug therapy: Secondary | ICD-10-CM | POA: Insufficient documentation

## 2019-04-18 LAB — CBC WITH DIFFERENTIAL/PLATELET
Abs Immature Granulocytes: 0.03 10*3/uL (ref 0.00–0.07)
Basophils Absolute: 0 10*3/uL (ref 0.0–0.1)
Basophils Relative: 0 %
Eosinophils Absolute: 0.3 10*3/uL (ref 0.0–0.5)
Eosinophils Relative: 4 %
HCT: 37 % (ref 36.0–46.0)
Hemoglobin: 11.2 g/dL — ABNORMAL LOW (ref 12.0–15.0)
Immature Granulocytes: 0 %
Lymphocytes Relative: 25 %
Lymphs Abs: 1.8 10*3/uL (ref 0.7–4.0)
MCH: 29.1 pg (ref 26.0–34.0)
MCHC: 30.3 g/dL (ref 30.0–36.0)
MCV: 96.1 fL (ref 80.0–100.0)
Monocytes Absolute: 0.6 10*3/uL (ref 0.1–1.0)
Monocytes Relative: 9 %
Neutro Abs: 4.6 10*3/uL (ref 1.7–7.7)
Neutrophils Relative %: 62 %
Platelets: 253 10*3/uL (ref 150–400)
RBC: 3.85 MIL/uL — ABNORMAL LOW (ref 3.87–5.11)
RDW: 12.8 % (ref 11.5–15.5)
WBC: 7.3 10*3/uL (ref 4.0–10.5)
nRBC: 0 % (ref 0.0–0.2)

## 2019-04-18 LAB — BRAIN NATRIURETIC PEPTIDE: B Natriuretic Peptide: 47.6 pg/mL (ref 0.0–100.0)

## 2019-04-18 LAB — BASIC METABOLIC PANEL
Anion gap: 8 (ref 5–15)
BUN: 7 mg/dL (ref 6–20)
CO2: 28 mmol/L (ref 22–32)
Calcium: 9.4 mg/dL (ref 8.9–10.3)
Chloride: 105 mmol/L (ref 98–111)
Creatinine, Ser: 0.59 mg/dL (ref 0.44–1.00)
GFR calc Af Amer: >60 mL/min (ref >60)
GFR calc non Af Amer: >60 mL/min (ref >60)
Glucose, Bld: 107 mg/dL — ABNORMAL HIGH (ref 70–99)
Potassium: 4 mmol/L (ref 3.5–5.1)
Sodium: 141 mmol/L (ref 135–145)

## 2019-04-18 LAB — TROPONIN I (HIGH SENSITIVITY): Troponin I (High Sensitivity): 3 ng/L (ref <18)

## 2019-04-18 LAB — D-DIMER, QUANTITATIVE: D-Dimer, Quant: 1.95 ug/mL-FEU — ABNORMAL HIGH (ref 0.00–0.50)

## 2019-04-18 LAB — POC SARS CORONAVIRUS 2 AG -  ED: SARS Coronavirus 2 Ag: NEGATIVE

## 2019-04-18 MED ORDER — IOHEXOL 350 MG/ML SOLN
100.0000 mL | Freq: Once | INTRAVENOUS | Status: AC | PRN
  Administered 2019-04-18: 100 mL via INTRAVENOUS

## 2019-04-18 MED ORDER — ALBUTEROL SULFATE HFA 108 (90 BASE) MCG/ACT IN AERS
4.0000 | INHALATION_SPRAY | Freq: Once | RESPIRATORY_TRACT | Status: AC
  Administered 2019-04-18: 13:00:00 4 via RESPIRATORY_TRACT
  Filled 2019-04-18: qty 6.7

## 2019-04-18 NOTE — Discharge Instructions (Signed)
Please use 2 puffs of the albuterol inhaler every 4-6 hours as needed for shortness of breath or cough.    You were tested for the coronavirus today.  The results will be available in the next 1 to 2 days.  If the results are positive the hospital will contact you.  If they are negative the hospital would not contact you.    Please remain in quarantine until you are aware of the results.  If the results are positive then you should be isolated for at least 7 days since the onset of your symptoms AND >72 hours after symptoms resolution (absence of fever without the use of fever reducing medicaiton and improvement in respiratory symptoms), whichever is longer  Please follow up with your primary care provider within 5-7 days for re-evaluation of your symptoms. If you do not have a primary care provider, information for a healthcare clinic has been provided for you to make arrangements for follow up care. Please return to the emergency department for any new or worsening symptoms.

## 2019-04-18 NOTE — ED Triage Notes (Signed)
Per [facility] pt from home. SOB and CP for past week. CP gets worse with palpation and with deep breaths. 324 ASA given in route.  99% on RA, 3L [place] put on for comfort

## 2019-04-18 NOTE — ED Notes (Signed)
SARS Coronavirus 2 Ag "NEGATIVE" reported to [employer] PA

## 2019-04-18 NOTE — ED Notes (Signed)
Pt lying on stretcher watching tv. Requested to be pulled up on stretcher - RN's x 2 assisted pt. Pt aware waiting for [facility] to transport her home.

## 2019-04-18 NOTE — ED Notes (Signed)
Pure Wick applied to pt per pt request. Diet Coke given as requested.

## 2019-04-18 NOTE — ED Provider Notes (Signed)
[facility] EMERGENCY DEPARTMENT Provider Note   CSN: [id number] Arrival date & time: 04/18/19  1137     History Chief Complaint  Patient presents with  . Shortness of Breath  . Chest Pain    Ellen [name] is a 59 y.o. female.  HPI   59 year old female with a history of bronchitis, degenerative disc disease, GERD, hyperlipidemia, hypertension, obesity, sleep apnea, who presents to the emergency department today for evaluation of shortness of breath.  Patient states that for the last week she has felt worsening shortness of breath.  Today symptoms seem to be the worst.  She reports central chest pain that she has had for some time.  States that she often hits this part of her chest on her hospital bed at home and it hurts with palpation.  It feels similar to chest pain that she has had in the past.  She denies that it is worse with inspiration.  She has had a cough this morning.  She reports chronic bilateral lower extremity swelling that she feels is unchanged today.  She denies any calf pain.  She denies any fevers.  She denies any known coronavirus exposures.  She has been compliant with her Lasix.  Past Medical History:  Diagnosis Date  . Bronchitis    hx of  . Degenerative joint disease   . Depression   . ENDOMETRIAL POLYP 01/08/2006   Dr. Agnes Lawrence.  2001:  D&C, benign;  07/14/10:  D&C, hysteroscopy, polypectomy and bx of mons pubis (peau d'orange)...... 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  Marland Kitchen GERD (gastroesophageal reflux disease)   . Hernia   . High cholesterol   . Hypertension   . Lipoma   . Obesity   . Sleep apnea    uses cpap    Patient Active Problem List   Diagnosis Date Noted  . Neurogenic bladder 01/27/2019  . Pyelonephritis 01/26/2019  . Paraplegia ([place]) 08/09/2018  . Back pain 06/17/2018  . OSA (obstructive sleep apnea) 06/17/2018  . Hypercalcemia 06/17/2018  . Anxiety 04/06/2017  . Acute  respiratory failure with hypoxia ([place])   . Acute hypoxemic respiratory failure ([place]) 04/05/2017  . SIRS (systemic inflammatory response syndrome) ([place]) 04/05/2017  . Elevated troponin 04/05/2017  . Hyponatremia 04/05/2017  . Urgency of micturation 04/14/2015  . Dizziness 04/14/2015  . Neck pain 03/31/2015  . Abnormal uterine bleeding 08/29/2012  . Incarcerated ventral hernia 09/11/2010  . 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 LIPOMA 01/08/2006  . Hyperlipidemia 01/08/2006  . Obesity, morbid, BMI 50 or higher ([place]) 01/08/2006  . DEPRESSION 01/08/2006  . Essential hypertension 01/08/2006    Past Surgical History:  Procedure Laterality Date  . CHOLECYSTECTOMY    . DILATION AND CURETTAGE OF UTERUS    . LUMBAR LAMINECTOMY/DECOMPRESSION MICRODISCECTOMY N/A 06/19/2018   Procedure: THORACIC TEN-THORACIC ELEVEN  LAMINECTOMY;  Surgeon: Consuella Lose, MD;  Location: [facility];  Service: Neurosurgery;  Laterality: N/A;  posterior  . resection of lipoma    . UMBILICAL HERNIA REPAIR       OB History   No obstetric history on file.     Family History  Problem Relation Age of Onset  . Heart disease Mother   . Diabetes Mother   . Cancer Father     Social History   Tobacco Use  . Smoking status: Never Smoker  . Smokeless tobacco: Never Used  Substance Use Topics  . Alcohol use: No    Alcohol/week: 0.0 standard  drinks  . Drug use: No    Home Medications Prior to Admission medications   Medication Sig Start Date End Date Taking? Authorizing Provider  albuterol (PROVENTIL HFA;VENTOLIN HFA) 108 (90 Base) MCG/ACT inhaler Inhale 2 puffs into the lungs every 4 (four) hours as needed for wheezing or shortness of breath. 11/29/15   Duffy Bruce, MD  amLODipine (NORVASC) 5 MG tablet Take 5 mg by mouth daily.    [provider]  aspirin (BL ADULT ASPIRIN LOW STRENGTH) 81 MG chewable tablet Chew 81 mg by mouth daily.      [provider]  atorvastatin (LIPITOR) 20 MG  tablet Take 20 mg by mouth daily at 6 PM.  03/28/17   [provider]  baclofen (LIORESAL) 10 MG tablet Take 10 mg by mouth 3 (three) times daily as needed. 01/13/19   [provider]  celecoxib (CELEBREX) 100 MG capsule Take 100 mg by mouth 2 (two) times daily. 01/14/19   [provider]  diclofenac sodium (VOLTAREN) 1 % GEL Apply 2-4 g topically every 2 (two) hours as needed (for bilateral knee pain).    [provider]  docusate sodium (COLACE) 100 MG capsule Take 1 capsule (100 mg total) by mouth 2 (two) times daily. 06/27/18   Hosie Poisson, MD  enalapril (VASOTEC) 20 MG tablet Take 40 mg by mouth daily.     [provider]  escitalopram (LEXAPRO) 10 MG tablet Take 10 mg by mouth daily.    [provider]  ferrous sulfate 325 (65 FE) MG tablet Take 325 mg by mouth daily with breakfast.    [provider]  furosemide (LASIX) 20 MG tablet Take 20 mg by mouth daily. 01/20/19   [provider]  ipratropium (ATROVENT) 0.02 % nebulizer solution Take 2.5 mLs (0.5 mg total) by nebulization every 6 (six) hours as needed for wheezing or shortness of breath. 04/08/17   Sheikh, Omair Latif, DO  levalbuterol (XOPENEX) 0.63 MG/3ML nebulizer solution Take 3 mLs (0.63 mg total) by nebulization every 6 (six) hours as needed for wheezing or shortness of breath. 04/08/17   Raiford Noble Latif, DO  medroxyPROGESTERone (PROVERA) 5 MG tablet Take 5 mg by mouth daily.    [provider]  omeprazole (PRILOSEC) 20 MG capsule Take 20 mg by mouth daily before breakfast.    [provider]  oxybutynin (DITROPAN) 5 MG tablet Take 5 mg by mouth 2 (two) times daily. 01/18/19   [provider]  polyethylene glycol powder (GLYCOLAX/MIRALAX) 17 GM/SCOOP powder Take 17 g by mouth daily as needed for mild constipation.     [provider]  pregabalin (LYRICA) 100 MG capsule Take 100 mg by mouth 3 (three) times daily. 01/06/19    [provider]  senna (SENOKOT) 8.6 MG TABS tablet Take 1 tablet (8.6 mg total) by mouth 2 (two) times daily. 06/27/18   Hosie Poisson, MD  traMADol (ULTRAM) 50 MG tablet Take 50 mg by mouth every 6 (six) hours as needed (for pain).    [provider]  vitamin B-12 (CYANOCOBALAMIN) 1000 MCG tablet Take 1 tablet (1,000 mcg total) by mouth daily. 08/14/18   Dhungel, Flonnie Overman, MD  Vitamin D, Ergocalciferol, (DRISDOL) 1.25 MG (50000 UT) CAPS capsule Take 50,000 Units by mouth every 7 (seven) days.    [provider]    Allergies    Codeine  Review of Systems   Review of Systems  Constitutional: Negative for chills and fever.  HENT: Negative for  ear pain and sore throat.   Eyes: Negative for visual disturbance.  Respiratory: Positive for cough and shortness of breath.   Cardiovascular: Positive for chest pain and leg swelling.  Gastrointestinal: Negative for abdominal pain, constipation, diarrhea, nausea and vomiting.  Genitourinary: Negative for dysuria and hematuria.  Musculoskeletal: Negative for back pain.  Skin: Negative for rash.  Neurological: Negative for headaches.  All other systems reviewed and are negative.   Physical Exam Updated Vital Signs BP (!) 142/70   Pulse 83   Temp 97.6 F (36.4 C) (Oral)   Resp 17   Ht 5\' 6"  (1.676 m)   Wt (!) 170.1 kg   LMP 07/11/2014   SpO2 100%   BMI 60.53 kg/m   Physical Exam Vitals and nursing note reviewed.  Constitutional:      General: She is not in acute distress.    Appearance: She is well-developed. She is obese.  HENT:     Head: Normocephalic and atraumatic.  Eyes:     Conjunctiva/sclera: Conjunctivae normal.  Cardiovascular:     Rate and Rhythm: Normal rate and regular rhythm.     Pulses: Normal pulses.     Heart sounds: Normal heart sounds. No murmur.  Pulmonary:     Effort: Pulmonary effort is normal. No respiratory distress.     Breath sounds: Examination of the right-lower field reveals  rales. Decreased breath sounds and rales present.  Abdominal:     Palpations: Abdomen is soft.     Tenderness: There is no abdominal tenderness. There is no guarding or rebound.  Musculoskeletal:     Cervical back: Neck supple.     Comments: 1-2+ pitting edema to the BLE. No calf TTP bilaterally.   Skin:    General: Skin is warm and dry.  Neurological:     Mental Status: She is alert.     ED Results / Procedures / Treatments   Labs (all labs ordered are listed, but only abnormal results are displayed) Labs Reviewed  CBC WITH DIFFERENTIAL/PLATELET - Abnormal; Notable for the following components:      Result Value   RBC 3.85 (*)    Hemoglobin 11.2 (*)    All other components within normal limits  BASIC METABOLIC PANEL - Abnormal; Notable for the following components:   Glucose, Bld 107 (*)    All other components within normal limits  D-DIMER, QUANTITATIVE (NOT AT [facility]) - Abnormal; Notable for the following components:   D-Dimer, Quant 1.95 (*)    All other components within normal limits  NOVEL CORONAVIRUS, NAA (HOSP ORDER, SEND-OUT TO REF LAB; TAT 18-24 HRS)  BRAIN NATRIURETIC PEPTIDE  POC SARS CORONAVIRUS 2 AG -  ED  TROPONIN I (HIGH SENSITIVITY)    EKG EKG Interpretation  Date/Time:  Saturday April 18 2019 12:00:10 EST Ventricular Rate:  59 PR Interval:    QRS Duration: 93 QT Interval:  387 QTC Calculation: 384 R Axis:   28 Text Interpretation: Sinus rhythm Low voltage, precordial leads Abnormal R-wave progression, early transition since last tracing no significant change Confirmed by Malvin Johns [phone]) on 04/18/2019 12:11:38 PM   Radiology CT Angio Chest PE W and/or Wo Contrast  Result Date: 04/18/2019 CLINICAL DATA:  Positive D-dimer, PE suspected EXAM: CT ANGIOGRAPHY CHEST WITH CONTRAST TECHNIQUE: Multidetector CT imaging of the chest was performed using the standard protocol during bolus administration of intravenous contrast. Multiplanar CT image  reconstructions and MIPs were obtained to evaluate the vascular anatomy. CONTRAST:  115mL OMNIPAQUE IOHEXOL 350  MG/ML SOLN COMPARISON:  CT chest, 01/29/2019 FINDINGS: Cardiovascular: Satisfactory opacification of the pulmonary arteries to the segmental level. No evidence of pulmonary embolism. Mild cardiomegaly. No pericardial effusion. Mediastinum/Nodes: No enlarged mediastinal, hilar, or axillary lymph nodes. Thyroid gland, trachea, and esophagus demonstrate no significant findings. Lungs/Pleura: Diffuse mosaic attenuation of the airspaces. No pleural effusion or pneumothorax. Upper Abdomen: No acute abnormality. Hepatic steatosis Musculoskeletal: No chest wall abnormality. No acute or significant osseous findings. Review of the MIP images confirms the above findings. IMPRESSION: 1. Negative examination for pulmonary embolism. 2. Diffuse mosaic attenuation of the airspaces, suggestive of small airways disease. 3. Cardiomegaly. 4. Hepatic steatosis. Electronically Signed   By: Eddie Candle M.D.   On: 04/18/2019 15:06   DG Chest Portable 1 View  Result Date: 04/18/2019 CLINICAL DATA:  Shortness of breath, chest pain EXAM: PORTABLE CHEST 1 VIEW COMPARISON:  01/26/2019 FINDINGS: The heart size and mediastinal contours are within normal limits. Both lungs are clear. The visualized skeletal structures are unremarkable. IMPRESSION: No active disease. Electronically Signed   By: Kathreen Devoid   On: 04/18/2019 12:20    Procedures Procedures (including critical care time)  Medications Ordered in ED Medications  albuterol (VENTOLIN HFA) 108 (90 Base) MCG/ACT inhaler 4 puff (4 puffs Inhalation Given 04/18/19 1241)  iohexol (OMNIPAQUE) 350 MG/ML injection 100 mL (100 mLs Intravenous Contrast Given 04/18/19 1452)    ED Course  I have reviewed the triage vital signs and the nursing notes.  Pertinent labs & imaging results that were available during my care of the patient were reviewed by me and considered in my  medical decision making (see chart for details).    MDM Rules/Calculators/A&P                      59 year old female presenting for evaluation of shortness of breath for the last week.  Developed a cough today.  No known Covid exposures.  Vital signs are reassuring.  Will obtain EKG, chest x-ray and give albuterol.  Interpreted labs, CBC without leukocytosis.  Anemia present but improved from baseline 2 months ago. BMP nonacute BNP negative making CHF exacerbation less likely Trop neg, doubt ACS. CP reproducible on exam and she states  ddimer elevated - will get CTA chest POC COVID negative, send out COVID testing obtained.   EKG reviewed, NSR, low voltages, abnormality r wave progression. No change from previous.  CXR personally reviewed. No focal consolidation, pneumothorax, pleural effusion or other acute abnormality. Agree with radiologist.   CT PE Negative examination for pulmonary embolism. 2. Diffuse mosaic attenuation of the airspaces, suggestive of small airways disease. 3. Cardiomegaly. 4. Hepatic steatosis.   Reassessed patient.  She states she feels some improvement after using the albuterol inhaler.  sxs likely secondary to small airways disease. No other emergent pathology of sxs identified at this time. She is able to speak in full sentences and does not have any tachypnea.  She satting at 100% on room air. She does not appear to meet admission criteria and I feel she can be managed as an outpatient. I advised her to continue taking the albuterol inhaler and schedule appointment with her PCP next week. Advised on return precautions. She voices understanding of the plan and reasons to return. All questions answered, pt stable for d/c.  ---  Ellen [name] was evaluated in Emergency Department on 04/18/2019 for the symptoms described in the history of present illness. She was evaluated in the context of the  global COVID-19 pandemic, which necessitated consideration that the  patient might be at risk for infection with the SARS-CoV-2 virus that causes COVID-19. Institutional protocols and algorithms that pertain to the evaluation of patients at risk for COVID-19 are in a state of rapid change based on information released by regulatory bodies including the CDC and federal and state organizations. These policies and algorithms were followed during the patient's care in the ED.   Final Clinical Impression(s) / ED Diagnoses Final diagnoses:  Small airways disease  Hepatic steatosis  Person under investigation for COVID-19    Rx / DC Orders ED Discharge Orders    None       Rodney Booze, PA-C 04/18/19 1541    Malvin Johns, MD 04/18/19 1729

## 2019-04-18 NOTE — ED Notes (Signed)
[facility] verified pt's name is on [employer] for transport.

## 2019-04-19 LAB — NOVEL CORONAVIRUS, NAA (HOSP ORDER, SEND-OUT TO REF LAB; TAT 18-24 HRS): SARS-CoV-2, NAA: NOT DETECTED

## 2019-04-21 ENCOUNTER — Ambulatory Visit: Payer: Medicare HMO

## 2019-04-24 ENCOUNTER — Ambulatory Visit: Payer: Medicare HMO

## 2019-04-27 ENCOUNTER — Ambulatory Visit: Payer: Medicare HMO

## 2019-04-29 ENCOUNTER — Ambulatory Visit: Payer: Medicare HMO

## 2019-06-23 ENCOUNTER — Ambulatory Visit: Payer: Medicare HMO | Admitting: Physical Therapy

## 2019-08-26 ENCOUNTER — Emergency Department (HOSPITAL_COMMUNITY): Payer: Medicare HMO

## 2019-08-26 ENCOUNTER — Emergency Department (HOSPITAL_COMMUNITY)
Admission: EM | Admit: 2019-08-26 | Discharge: 2019-08-26 | Disposition: A | Discharge status: Home / Self Care | Payer: Medicare HMO | Attending: Emergency Medicine | Admitting: Emergency Medicine

## 2019-08-26 DIAGNOSIS — Z20822 Contact with and (suspected) exposure to covid-19: Secondary | ICD-10-CM | POA: Diagnosis not present

## 2019-08-26 DIAGNOSIS — R0602 Shortness of breath: Secondary | ICD-10-CM | POA: Diagnosis present

## 2019-08-26 DIAGNOSIS — R0600 Dyspnea, unspecified: Secondary | ICD-10-CM | POA: Diagnosis not present

## 2019-08-26 DIAGNOSIS — Z7982 Long term (current) use of aspirin: Secondary | ICD-10-CM | POA: Insufficient documentation

## 2019-08-26 DIAGNOSIS — I10 Essential (primary) hypertension: Secondary | ICD-10-CM | POA: Diagnosis not present

## 2019-08-26 DIAGNOSIS — Z79899 Other long term (current) drug therapy: Secondary | ICD-10-CM | POA: Diagnosis not present

## 2019-08-26 LAB — CBC
HCT: 34 % — ABNORMAL LOW (ref 36.0–46.0)
Hemoglobin: 10.8 g/dL — ABNORMAL LOW (ref 12.0–15.0)
MCH: 30.5 pg (ref 26.0–34.0)
MCHC: 31.8 g/dL (ref 30.0–36.0)
MCV: 96 fL (ref 80.0–100.0)
Platelets: 316 10*3/uL (ref 150–400)
RBC: 3.54 MIL/uL — ABNORMAL LOW (ref 3.87–5.11)
RDW: 13.2 % (ref 11.5–15.5)
WBC: 8.9 10*3/uL (ref 4.0–10.5)
nRBC: 0 % (ref 0.0–0.2)

## 2019-08-26 LAB — CBG MONITORING, ED: Glucose-Capillary: 91 mg/dL (ref 70–99)

## 2019-08-26 LAB — BASIC METABOLIC PANEL
Anion gap: 7 (ref 5–15)
BUN: 14 mg/dL (ref 6–20)
CO2: 28 mmol/L (ref 22–32)
Calcium: 9.2 mg/dL (ref 8.9–10.3)
Chloride: 103 mmol/L (ref 98–111)
Creatinine, Ser: 0.56 mg/dL (ref 0.44–1.00)
GFR calc Af Amer: >60 mL/min (ref >60)
GFR calc non Af Amer: >60 mL/min (ref >60)
Glucose, Bld: 92 mg/dL (ref 70–99)
Potassium: 4.1 mmol/L (ref 3.5–5.1)
Sodium: 138 mmol/L (ref 135–145)

## 2019-08-26 LAB — TROPONIN I (HIGH SENSITIVITY)
Troponin I (High Sensitivity): 2 ng/L (ref <18)
Troponin I (High Sensitivity): 2 ng/L (ref <18)

## 2019-08-26 LAB — SARS CORONAVIRUS 2 BY RT PCR (HOSPITAL ORDER, PERFORMED IN ~~LOC~~ HOSPITAL LAB): SARS Coronavirus 2: NEGATIVE

## 2019-08-26 LAB — BRAIN NATRIURETIC PEPTIDE: B Natriuretic Peptide: 8 pg/mL (ref 0.0–100.0)

## 2019-08-26 NOTE — ED Triage Notes (Signed)
Per EMS: Patient is coming from home for a c/o labial cyst that has not improved with antibiotics x2 weeks. Patient also complaining of SOB as well x4 days. Patient denies chest pain, N/V/D, or urinary symptoms

## 2019-08-26 NOTE — ED Provider Notes (Signed)
[facility] DEPT Provider Note   CSN: [id number] Arrival date & time: 08/26/19  1720     History Chief Complaint  Patient presents with   Shortness of Breath   Recurrent Skin Infections    Ellen [name] is a 59 y.o. female.  HPI   Patient presented to the ED for evaluation of chest pain. Hx of multiple medical problems, morbid obesity, bed ridden due to complications from a prior back surgery.   Patient states she has been having episodes of chest pain since Saturday.  They last maybe a few minutes at a time.  She is called EMS and it seems to get better after they give her some oxygen but she has not come for medical evaluation.  Patient states it is a pressure type discomfort in her chest.  It does not radiate.  She does not have any nausea.  Nothing seems to bring it on.  She does not have a history of heart disease.  She does not have a history of PE or DVT.  Patient denies any fevers chills or recent cough.  She has not been vaccinated for Covid.  Patient also states she has had a cyst in her labial region.  Her doctor has been treating her with antibiotics.  She does not think it is getting much better.  She denies any fevers or chills.  No discharge.  Past Medical History:  Diagnosis Date   Bronchitis    hx of   Degenerative joint disease    Depression    ENDOMETRIAL POLYP 01/08/2006   Dr. Agnes Lawrence.  2001:  D&C, benign;  07/14/10:  D&C, hysteroscopy, polypectomy and bx of mons pubis (peau d'orange)...... 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   GERD (gastroesophageal reflux disease)    Hernia    High cholesterol    Hypertension    Lipoma    Obesity    Sleep apnea    uses cpap    Patient Active Problem List   Diagnosis Date Noted   Neurogenic bladder 01/27/2019   Pyelonephritis 01/26/2019   Paraplegia ([place]) 08/09/2018   Back pain 06/17/2018   OSA (obstructive sleep apnea) 06/17/2018    Hypercalcemia 06/17/2018   Anxiety 04/06/2017   Acute respiratory failure with hypoxia (HCC)    Acute hypoxemic respiratory failure ([place]) 04/05/2017   SIRS (systemic inflammatory response syndrome) ([place]) 04/05/2017   Elevated troponin 04/05/2017   Hyponatremia 04/05/2017   Urgency of micturation 04/14/2015   Dizziness 04/14/2015   Neck pain 03/31/2015   Abnormal uterine bleeding 08/29/2012   Incarcerated ventral hernia 09/11/2010   Arthritis of both knees 02/15/2006   LIPOMA 01/08/2006   Hyperlipidemia 01/08/2006   Obesity, morbid, BMI 50 or higher ([place]) 01/08/2006   DEPRESSION 01/08/2006   Essential hypertension 01/08/2006    Past Surgical History:  Procedure Laterality Date   CHOLECYSTECTOMY     DILATION AND CURETTAGE OF UTERUS     LUMBAR LAMINECTOMY/DECOMPRESSION MICRODISCECTOMY N/A 06/19/2018   Procedure: THORACIC TEN-THORACIC ELEVEN  LAMINECTOMY;  Surgeon: Consuella Lose, MD;  Location: [facility];  Service: Neurosurgery;  Laterality: N/A;  posterior   resection of lipoma     UMBILICAL HERNIA REPAIR       OB History   No obstetric history on file.     Family History  Problem Relation Age of Onset   Heart disease Mother    Diabetes Mother    Cancer Father     Social History  Tobacco Use   Smoking status: Never Smoker   Smokeless tobacco: Never Used  Vaping Use   Vaping Use: Never used  Substance Use Topics   Alcohol use: No    Alcohol/week: 0.0 standard drinks   Drug use: No    Home Medications Prior to Admission medications   Medication Sig Start Date End Date Taking? Authorizing Provider  albuterol (PROVENTIL HFA;VENTOLIN HFA) 108 (90 Base) MCG/ACT inhaler Inhale 2 puffs into the lungs every 4 (four) hours as needed for wheezing or shortness of breath. 11/29/15   Duffy Bruce, MD  amLODipine (NORVASC) 5 MG tablet Take 5 mg by mouth daily.    [provider]  aspirin (BL ADULT ASPIRIN LOW STRENGTH) 81 MG  chewable tablet Chew 81 mg by mouth daily.      [provider]  atorvastatin (LIPITOR) 20 MG tablet Take 20 mg by mouth daily at 6 PM.  03/28/17   [provider]  baclofen (LIORESAL) 10 MG tablet Take 10 mg by mouth 3 (three) times daily as needed. 01/13/19   [provider]  celecoxib (CELEBREX) 100 MG capsule Take 100 mg by mouth 2 (two) times daily. 01/14/19   [provider]  diclofenac sodium (VOLTAREN) 1 % GEL Apply 2-4 g topically every 2 (two) hours as needed (for bilateral knee pain).    [provider]  docusate sodium (COLACE) 100 MG capsule Take 1 capsule (100 mg total) by mouth 2 (two) times daily. 06/27/18   Hosie Poisson, MD  enalapril (VASOTEC) 20 MG tablet Take 40 mg by mouth daily.     [provider]  escitalopram (LEXAPRO) 10 MG tablet Take 10 mg by mouth daily.    [provider]  ferrous sulfate 325 (65 FE) MG tablet Take 325 mg by mouth daily with breakfast.    [provider]  furosemide (LASIX) 20 MG tablet Take 20 mg by mouth daily. 01/20/19   [provider]  ipratropium (ATROVENT) 0.02 % nebulizer solution Take 2.5 mLs (0.5 mg total) by nebulization every 6 (six) hours as needed for wheezing or shortness of breath. 04/08/17   Sheikh, Omair Latif, DO  levalbuterol (XOPENEX) 0.63 MG/3ML nebulizer solution Take 3 mLs (0.63 mg total) by nebulization every 6 (six) hours as needed for wheezing or shortness of breath. 04/08/17   Raiford Noble Latif, DO  medroxyPROGESTERone (PROVERA) 5 MG tablet Take 5 mg by mouth daily.    [provider]  omeprazole (PRILOSEC) 20 MG capsule Take 20 mg by mouth daily before breakfast.    [provider]  oxybutynin (DITROPAN) 5 MG tablet Take 5 mg by mouth 2 (two) times daily. 01/18/19   [provider]  polyethylene glycol powder (GLYCOLAX/MIRALAX) 17 GM/SCOOP powder Take 17 g by mouth daily as needed for mild constipation.     [provider]  pregabalin (LYRICA) 100 MG capsule Take 100 mg by mouth 3 (three) times daily. 01/06/19   [provider]  senna (SENOKOT) 8.6 MG TABS tablet Take 1 tablet (8.6 mg total) by mouth 2 (two) times daily. 06/27/18   Hosie Poisson, MD  traMADol (ULTRAM) 50 MG tablet Take 50 mg by mouth every 6 (six) hours as needed (for pain).    [provider]  vitamin B-12 (CYANOCOBALAMIN) 1000 MCG tablet Take 1 tablet (1,000 mcg total) by mouth daily. 08/14/18   Dhungel, Flonnie Overman, MD  Vitamin D, Ergocalciferol, (DRISDOL) 1.25 MG (50000 UT) CAPS capsule Take 50,000 Units by mouth  every 7 (seven) days.    [provider]    Allergies    Codeine  Review of Systems   Review of Systems  All other systems reviewed and are negative.   Physical Exam Updated Vital Signs BP (!) 115/44    Pulse 95    Temp 98.1 F (36.7 C) (Oral)    Resp (!) 22    LMP 07/11/2014    SpO2 91%   Physical Exam Vitals and nursing note reviewed.  Constitutional:      General: She is not in acute distress.    Appearance: She is well-developed. She is obese.  HENT:     Head: Normocephalic and atraumatic.     Right Ear: External ear normal.     Left Ear: External ear normal.  Eyes:     General: No scleral icterus.       Right eye: No discharge.        Left eye: No discharge.     Conjunctiva/sclera: Conjunctivae normal.  Neck:     Trachea: No tracheal deviation.  Cardiovascular:     Rate and Rhythm: Normal rate and regular rhythm.  Pulmonary:     Effort: Pulmonary effort is normal. No respiratory distress.     Breath sounds: Normal breath sounds. No stridor. No wheezing or rales.  Abdominal:     General: Bowel sounds are normal. There is no distension.     Palpations: Abdomen is soft.     Tenderness: There is no abdominal tenderness. There is no guarding or rebound.     Comments: Large pannus  Genitourinary:    Comments: Edema noted distal tenderness and perineum, vaginal exam limited  because of the patient's body habitus and limited mobility of her legs Musculoskeletal:        General: No tenderness.     Cervical back: Neck supple.     Comments: Edema lower legs  Skin:    General: Skin is warm and dry.     Findings: No rash.  Neurological:     Mental Status: She is alert.     Cranial Nerves: No cranial nerve deficit (no facial droop, extraocular movements intact, no slurred speech).     Sensory: Sensory deficit present.     Motor: Weakness present. No abnormal muscle tone or seizure activity.     Coordination: Coordination normal.     Comments: Paraplegia     ED Results / Procedures / Treatments   Labs (all labs ordered are listed, but only abnormal results are displayed) Labs Reviewed  CBC - Abnormal; Notable for the following components:      Result Value   RBC 3.54 (*)    Hemoglobin 10.8 (*)    HCT 34.0 (*)    All other components within normal limits  SARS CORONAVIRUS 2 BY RT PCR (HOSPITAL ORDER, [facility])  BASIC METABOLIC PANEL  BRAIN NATRIURETIC PEPTIDE  CBG MONITORING, ED  TROPONIN I (HIGH SENSITIVITY)  TROPONIN I (HIGH SENSITIVITY)    EKG EKG Interpretation  Date/Time:  Wednesday August 26 2019 17:46:11 EDT Ventricular Rate:  79 PR Interval:    QRS Duration: 91 QT Interval:  348 QTC Calculation: 399 R Axis:   27 Text Interpretation: Sinus rhythm Low voltage, precordial leads left bundle branch block resolved since last tracing Confirmed by Dorie Rank [phone]) on 08/26/2019 5:59:21 PM   Radiology CT Chest Wo Contrast  Result Date: 08/26/2019 CLINICAL DATA:  Possible pneumomediastinum. EXAM: CT CHEST WITHOUT  CONTRAST TECHNIQUE: Multidetector CT imaging of the chest was performed following the standard protocol without IV contrast. COMPARISON:  Radiograph 08/26/2019, CT 04/18/2019 FINDINGS: Cardiovascular: Cardiac size is at the upper limits of normal, with diminished cardiomegaly from the comparison study. No  pericardial effusion. No pneumopericardium. Atherosclerotic plaque within the normal caliber aorta. No periaortic stranding or hemorrhage. Shared origin of the brachiocephalic and left common carotid arteries. Central pulmonary arteries are normal caliber. Luminal evaluation of the vasculature precluded in the absence of contrast media. No gross venous abnormality is seen. Mediastinum/Nodes: No mediastinal gas, fluid or hemorrhage. Abnormality on chest radiograph likely reflect some paramediastinal atelectatic changes. No mediastinal fluid or gas. Normal thyroid gland and thoracic inlet. No acute abnormality of the trachea or esophagus. No worrisome mediastinal or axillary adenopathy. Hilar nodal evaluation is limited in the absence of intravenous contrast media. Lungs/Pleura: Paramediastinal atelectatic changes as above. No consolidation, features of edema, pneumothorax, or effusion. No suspicious pulmonary nodules or masses. Upper Abdomen: Mild anterior abdominal wall laxity. Post cholecystectomy. No acute abnormality in the abdomen or pelvis. Musculoskeletal: Multilevel degenerative changes are present in the imaged portions of the spine. No acute osseous abnormality or suspicious osseous lesion. No worrisome chest wall lesions. IMPRESSION: 1. No acute intrathoracic abnormality, specifically no evidence of pneumomediastinum. 2. Abnormality on chest radiograph likely reflect some paramediastinal atelectatic changes. 3. Aortic Atherosclerosis (ICD10-I70.0). Electronically Signed   By: Lovena Le M.D.   On: 08/26/2019 21:52   DG Chest Portable 1 View  Result Date: 08/26/2019 CLINICAL DATA:  Dyspnea EXAM: PORTABLE CHEST 1 VIEW COMPARISON:  04/21/2019 FINDINGS: No focal opacity or pleural effusion. Stable cardiomediastinal silhouette. Questionable trace amount of pneumomediastinum along the left mediastinal border. No pneumothorax. IMPRESSION: Questionable trace amount of pneumomediastinum along the left  mediastinal border. Otherwise no significant interval change. Electronically Signed   By: Donavan Foil M.D.   On: 08/26/2019 18:33    Procedures Procedures (including critical care time)  Medications Ordered in ED Medications - No data to display  ED Course  I have reviewed the triage vital signs and the nursing notes.  Pertinent labs & imaging results that were available during my care of the patient were reviewed by me and considered in my medical decision making (see chart for details).  Clinical Course as of Aug 27 1043  Wed Aug 26, 2019  2038 First troponin normal.  Covid is negative.   [JK]  2038 Hemoglobin is stable.   [JK]  2038 Chest x-ray suggest possible trace pneumomediastinum.  Will CT chest to evaluate further   [JK]  2158 Chest CT without findings of pneumomediastinum   [JK]  2215 Vaginal area re-examined.  No mass or cyst appreciated on inspection and palpation.  Appears to be more edematous.     [JK]    Clinical Course User Index [JK] Dorie Rank, MD   MDM Rules/Calculators/A&P                          Pt presented with complaints of shortness of breath.  Lungs clear, breathing easily.  No oxygen requirement.  CXR without pna or chf.  Labs reassuring.  ?pneumomediastinum on CXR.  CT chest performed which was normal.   Dyspnea could be related to related to pulm htn.  Pt does have obstructive sleep apnea. Morbid obesity.  Stable for outpt management  Complained of vaginal edema.  Exam difficult due to body habitus however repeat exam with assistance and unable  to appreciate discrete mass, abscess.  Perineal edema noted.  ? Fluid retention.  Recc gyn follow up. Final Clinical Impression(s) / ED Diagnoses Final diagnoses:  Dyspnea, unspecified type    Rx / DC Orders ED Discharge Orders    None       Dorie Rank, MD 08/27/19 1045

## 2019-08-26 NOTE — Discharge Instructions (Signed)
Continue your current medications.  The ER evaluation today was reassuring.  Return for worsening symptoms.  Follow-up with your primary care doctor and consider following up with an OB/GYN doctor as we discussed

## 2019-11-12 ENCOUNTER — Ambulatory Visit: Payer: Medicare HMO | Attending: Physician Assistant | Admitting: Physical Therapy

## 2019-11-12 ENCOUNTER — Other Ambulatory Visit: Payer: Self-pay

## 2019-11-12 DIAGNOSIS — R29818 Other symptoms and signs involving the nervous system: Secondary | ICD-10-CM | POA: Diagnosis present

## 2019-11-12 DIAGNOSIS — R293 Abnormal posture: Secondary | ICD-10-CM | POA: Diagnosis present

## 2019-11-12 DIAGNOSIS — G8222 Paraplegia, incomplete: Secondary | ICD-10-CM | POA: Diagnosis present

## 2019-11-12 DIAGNOSIS — R209 Unspecified disturbances of skin sensation: Secondary | ICD-10-CM | POA: Insufficient documentation

## 2019-11-12 DIAGNOSIS — M6281 Muscle weakness (generalized): Secondary | ICD-10-CM

## 2019-11-12 DIAGNOSIS — R208 Other disturbances of skin sensation: Secondary | ICD-10-CM

## 2019-11-12 NOTE — Therapy (Addendum)
[facility] [address] [place], [place], [address] Phone: [phone]   Fax:  [phone]  Physical Therapy Wheelchair Evaluation  Patient Details  Name: Ellen [name] MRN: [id number] Date of Birth: [birth year] Referring Provider (PT): Clovia Cuff, MD   Encounter Date: 11/12/2019   PT End of Session - 11/12/19 2032    Visit Number 1    Number of Visits 1    Date for PT Re-Evaluation 11/12/19    Authorization Type [employer] and Medicaid    PT Start Time 1315    PT Stop Time 1445    PT Time Calculation (min) 90 min    Equipment Utilized During Treatment Other (comment)   hoyer   Activity Tolerance Patient tolerated treatment well    Behavior During Therapy [facility] for tasks assessed/performed           Past Medical History:  Diagnosis Date  . Bronchitis    hx of  . Degenerative joint disease   . Depression   . ENDOMETRIAL POLYP 01/08/2006   Dr. Agnes Lawrence.  2001:  D&C, benign;  07/14/10:  D&C, hysteroscopy, polypectomy and bx of mons pubis (peau d'orange)...... 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  Marland Kitchen GERD (gastroesophageal reflux disease)   . Hernia   . High cholesterol   . Hypertension   . Lipoma   . Obesity   . Sleep apnea    uses cpap    Past Surgical History:  Procedure Laterality Date  . CHOLECYSTECTOMY    . DILATION AND CURETTAGE OF UTERUS    . LUMBAR LAMINECTOMY/DECOMPRESSION MICRODISCECTOMY N/A 06/19/2018   Procedure: THORACIC TEN-THORACIC ELEVEN  LAMINECTOMY;  Surgeon: Consuella Lose, MD;  Location: [facility];  Service: Neurosurgery;  Laterality: N/A;  posterior  . resection of lipoma    . UMBILICAL HERNIA REPAIR      There were no vitals filed for this visit.    Subjective Assessment - 11/12/19 2025    Subjective Patient referred back to outpatient Neurorehab for evaluation for power mobility due to paraplegia following two major falls and T10-T11  decompressive laminectomy on 06/19/2018.  Pt then D/C to SNF > home with heavy duty manual wheelchair.    Pertinent History Bronchitis, DJD and OA of bilat knees, depression, GERD, hernia, HLD, HTN, lipoma, obesity, OSA with CPAP, acute hypoxemic respiratory failure, back pain    How long can you sit comfortably? 2-3 hours at a time    Patient Stated Goals To increase independence with mobility    Currently in Pain? Yes              [facility] PT Assessment - 11/12/19 2029      Assessment   Medical Diagnosis Paraplegia    Referring Provider (PT) Clovia Cuff, MD    Onset Date/Surgical Date 06/19/18    Hand Dominance Left    Prior Therapy SNF, HH      Precautions   Precautions Other (comment)    Precaution Comments Bronchitis, DJD and OA of bilat knees, depression, GERD, hernia, HLD, HTN, lipoma, obesity, OSA with CPAP, acute hypoxemic respiratory failure, back pain, lumbar laminectomy/decompression microdiscectomy T10-T11      Balance Screen   Has the patient fallen in the past 6 months No      Prior Function   Level of Independence Independent              Mobility/Seating Evaluation    PATIENT INFORMATION: Name: Ellen [name] DOB: 04-24-1960  Sex: Female Date seen: 9.23.2021 Time: 13:15  Address:  [place] [id number]      Physician: Clovia Cuff, MD This evaluation/justification form will serve as the LMN for the following suppliers: __________________________ Supplier: [employer] Contact Person: Luz Brazen, ATP Phone:  [phone]   Seating Therapist: Misty Stanley, PT, DPT Phone:   [phone]   Phone: [phone]     Spouse/Parent/Caregiver name: Fiance: Ellen [name]  Phone number: [phone] or [phone] Insurance/Payer: Josephine Igo and Medicaid     Reason for Referral: Power Mobility  Patient/Caregiver Goals: To be more independent with MRADL's and household mobility  Patient was seen for face-to-face evaluation  for new power wheelchair.  Also present was Yvone Neu to discuss recommendations and wheelchair options.  Further paperwork was completed and sent to vendor.  Patient appears to qualify for power mobility device at this time per objective findings.   MEDICAL HISTORY: Diagnosis: Primary Diagnosis: Paraplegia Onset: 06/17/2018 Diagnosis: ?????   _0 Progressive Disease Relevant past and future surgeries: T10-T11 laminectomy/decompression microdiscectomy, umbilical hernia repair   Height: 5'6" Weight: 360 lb Explain recent changes or trends in weight: Is working on losing weight   History including Falls: January and March of 2020 - Two major falls that led to LE numbness/tingling and inability to ambulate - required thoracic surgery. After surgery major fall at SNF, fell out of bed when rolling with staff.  PMH: Bronchitis, DJD and OA of bilat knees, depression, GERD, hernia, HLD, HTN, lipoma, obesity, OSA with CPAP, acute hypoxemic respiratory failure, back pain      HOME ENVIRONMENT: _1 House  _2 Condo/town home  _3 Apartment  _4 Assisted Living    _5 Lives Alone _6  Lives with Others                                                                                          Hours with caregiver: Aide in the morning for 2.5 hours.  _7 Home is accessible to patient           Stairs      _8 Yes _9  No     Ramp _10 Yes _11 No Comments:  Lives with finace.  Level entry, hardwood floors.  Manual w/c does not fit through bathroom door   COMMUNITY ADL: TRANSPORTATION: _12 Car    _13 Van    <IWOEHOZYYQMGNOIB>_7<\/CWUGQBVQXIHWTUUE>_28 Public Transportation    _15 [employer] w/c Lift    _16 Ambulance    _17 Other:       _18 Sits in wheelchair during transport  Employment/School: ????? Specific requirements pertaining to mobility ?????  Other: ?????    FUNCTIONAL/SENSORY PROCESSING SKILLS:  Handedness:   _19 Right     _20 Left    _21 NA  Comments:  ?????  Functional Processing Skills for Wheeled Mobility _22 Processing Skills are adequate for safe wheelchair  operation  Areas of concern than may interfere with safe operation of wheelchair Description of problem   _23  Attention to environment      _24 Judgment      _25  Hearing  _26  Vision or visual processing      _27 Motor Planning  _28  Fluctuations in Behavior  ?????    VERBAL COMMUNICATION: _29 WFL receptive _30  WFL expressive _31   Understandable  _0 Difficult to understand  _1 non-communicative _2  Uses an augmented communication device  CURRENT SEATING / MOBILITY: Current Mobility Base:  _3 None _4 Dependent _5 Manual _6 Scooter _7 Power  Type of Control: ?????  Manufacturer:  Drive Size:  22 x 18 Age: [age] year  Current Condition of Mobility Base:  Fair   Current Wheelchair components:  Has a basic cushion but doesn't use because she slides off of it, sling back and sling seat, ELR, drive wheels with hand rims, push canes   Describe posture in present seating system:  Sacral sitting, posterior lean, bilat hip ABD and ER, femurs extend 4-5" past seat edge, wheelchair sides push into patient's hips causing increased pressure, foot placement on foot plates is maintained with cut leather belts strapped around each ankle and leg rest which cut into her skin when she is experiencing increased LE edema      SENSATION and SKIN ISSUES: Sensation _8 Intact  _9 Impaired _10 Absent  Level of sensation: T10-T11 Pressure Relief: Able to perform effective pressure relief :    _11 Yes  _12  No Method: Hoyer lift to transfer out of wheelchair and back into hospital bed If not, Why?: UE weakness and obesity, not able to boost or perform lateral leans, does not use w/c cushion due to sliding forwards  Skin Issues/Skin Integrity Current Skin Issues  _13 Yes _14 No _15 Intact _16  Red area_17  Open Area  _18 Scar Tissue _19 At risk from prolonged sitting Where  ?????  History of Skin Issues  _20 Yes _21 No Where  buttocks under ischial tuberosity on R When  June 2020  Hx of skin flap surgeries  _22 Yes _23 No Where  ????? When  ?????  Limited  sitting tolerance _24 Yes _25 No Hours spent sitting in wheelchair daily: 3 hours at one time  Complaint of Pain:  Please describe: mid back which improves when lying supine; bilat LE - burning neuropathic pain; L wrist pain, R shoulder pain due to OA   Swelling/Edema: LE dependent edema, worse on LLE   ADL STATUS (in reference to wheelchair use):  Indep Assist Unable Indep with Equip Not assessed Comments  Dressing _26  _27  _28  _29  _30  bed level  Eating _31  _32  _33  _34  _35  seated in w/c at table  Toileting _36  _37  _38  _39  _40  uses adult diapers, total assistance for hygiene and to don a new diaper   Bathing _41  _42  _43  _44  _45  bed level  Grooming/Hygiene _46  _47  _48  _49  _50  set up assistance, wheelchair level  Meal Prep _51  _52  _53  _54  _55  seated in wheelchair - performs basic tasks with set up at the table - unable to reach items in cabinets, fridge/freezer or on stovetop  IADLS <NGEXBMWUXLKGMWNU>_2<\/VOZDGUYQIHKVQQVZ>_56  _57  _58  _59  _60  ?????  Bowel Management: _61 Continent  _62 Incontinent  _63 Accidents Comments:  Not able to transfer to [facility] or toilet, uses diaper in bed  Bladder Management: _64 Continent  _65 Incontinent  _66 Accidents Comments:  Not able to transfer to [facility] or toilet, uses diaper in bed     WHEELCHAIR SKILLS: Manual w/c Propulsion: _67 UE or LE strength and endurance sufficient to participate in ADLs using manual wheelchair Arm : _68 left _69 right   _70 Both      Distance: ????? Foot:  _71 left _72 right   _73 Both  Operate Scooter: _74  Strength, hand grip, balance and transfer appropriate for use _75 Living environment is accessible for use of scooter  Operate Power w/c:  _76  Std. Joystick   _77  Alternative Controls Indep _78  Assist _79  Dependent/unable _80  N/A _81   _82 Safe          [  x] Functional      Distance: ?????  Bed confined without wheelchair _0  Yes _1  No   STRENGTH/RANGE OF MOTION:  AROM Range of Motion Strength  Shoulder WFL 4-/5  Elbow WFL 4-/5  Wrist/Hand WFL 4-/5  Hip L: hip ABD 30 deg; lacking 10 deg to neutral hip extension R: hip ABD 10  deg; lacking 10 deg to neutral hip extension L: 0/5 R: 1/5  Knee L: lacking 50 deg to full knee extension R: lacking 20 deg to full knee extension L: 0/5 R: 0/5  Ankle 90 deg DF bilaterally L: 0/5 R: 2/5     MOBILITY/BALANCE:  _2  Patient is totally dependent for mobility  Total assistance for transfers with hoyer lift, total assistance to propel heavy duty manual wheelchair    Balance Transfers Ambulation  Sitting Balance: Standing Balance: _3  Independent _4  Independent/Modified Independent  _5  WFL     _6  WFL _7  Supervision _8  Supervision  _9  Uses UE for balance  _10  Supervision _11  Min Assist _12  Ambulates with Assist  ?????    _13  Min Assist _14  Min assist _15  Mod Assist _16  Ambulates with Device:      _17  RW  _18  StW  _19  Cane  _20  ?????  _21  Mod Assist _22  Mod assist _23  Max assist   _24  Max Assist _25  Max assist _26  Dependent _27  Indep. Short Distance Only  _28  Unable _29  Unable _30  Lift / Sling Required Distance (in feet)  ?????   _31  Sliding board _32  Unable to Ambulate (see explanation below)  Cardio Status:  _33 Intact  _34  Impaired   _35  NA     HTN  Respiratory Status:  _36 Intact   _37 Impaired   _38 NA     OSA with CPAP, bronchitis  Orthotics/Prosthetics: ?????  Comments (Address manual vs power w/c vs scooter): Shauntel Prest has a mobility deficit which cannot be remediated with a cane or walker due to muscle paralysis and absent sensation below the level of T10-T11.  Eldene requires total assistance and the use of a Hoyer lift in order to safely transfer from her hospital bed to her current heavy duty manual wheelchair.  Elodia requires a heavy duty wheelchair because her weight is greater than 250 lb.  She is unable to independently and efficiently propel her manual wheelchair due to the width, size and weight of the wheelchair.  She is unable to reach the drive wheels due to their posterior placement.  Ayssa is dependent for manual wheelchair propulsion, lacks access to her home environment and is  not able to perform her normal MRADL's independently.  Her manual wheelchair does not provide the necessary power seat functions for adequate and independent pressure relief.  Her current wheelchair cannot be modified and offers insufficient postural support which results in increased mid back pain, increased LE edema, increased spasticity, increased LE neuropathic pain, lack of appropriate joint positioning with increased pressure on her hips, buttocks, thighs and feet, and increased risk for falling out of her wheelchair.  In order to maintain LE contact with leg rests, Ja's family must use leather belts strapped around her ankles which contributes to her risk for skin break down. Dudley can only tolerate 2-3 hours in her manual wheelchair before she must return to her hospital bed for rest periods, pressure relief and to manage her LE edema, back pain and LE pain.  Avantika is unable to utilize a POV or basic power wheelchair because she requires total assistance for transfers and because of her need  for the power seat functions and/or expandable electronics specified for effective pressure relief and postural control.  Aixa requires the requested power wheelchair with power seat functions specified in order to allow appropriate mobility for activities of daily living in their home and community.  Neko requires the use of a seat elevator in order to improve her independence with seated MRADL's including grooming/hygiene and meal prep.  Karah also has a goal to be able to perform lateral scooting transfers and would benefit from the seat elevator in order to change her seat height to make lateral scooting transfers to toilet or hospital bed more level.          Anterior / Posterior Obliquity Rotation-Pelvis ?????  PELVIS    _0  _1  _2   Neutral Posterior Anterior  _3  _4  _5   WFL Rt elev Lt elev  _6  _7  _8   WFL Right Left                      Anterior    Anterior     _9  Fixed _10  Other _11  Partly  Flexible _12  Flexible   _13  Fixed _14  Other _15  Partly Flexible  _16  Flexible  _17  Fixed _18  Other _19  Partly Flexible  _20  Flexible   TRUNK  _21  _22  _23   WFL ? Thoracic ? Lumbar  Kyphosis Lordosis  _24  _25  _26   WFL Convex Convex  Right Left _27 c-curve _28 s-curve _29 multiple  _30  Neutral _31  Left-anterior _32  Right-anterior     _33  Fixed _34  Flexible _35  Partly Flexible _36  Other  _37  Fixed _38  Flexible _39  Partly Flexible _40  Other  _41  Fixed             _42  Flexible _43  Partly Flexible _44  Other    Position Windswept  ?????  HIPS          _45            _46               _47    Neutral       Abduct        ADduct         _48           _49            _50   Neutral Right           Left      _51  Fixed _52  Subluxed _53  Partly Flexible _54  Dislocated _55  Flexible  _56  Fixed _57  Other _58  Partly Flexible  _59  Flexible                 Foot Positioning Knee Positioning  ?????    _60  WFL  _61 Lt _62 Rt _63  WFL  _64 Lt _65 Rt    KNEES ROM concerns: ROM concerns:    & Dorsi-Flexed _66 Lt _67 Rt Due to spasticity, knees rest in flexion, if extended pt experiences spasms    FEET Plantar Flexed _68 Lt _69 Rt      Inversion                 _70 Lt _71 Rt      Eversion                 _72 Lt _73 Rt     HEAD _74  Functional _75  Good Head Control  ?????  & _76  Flexed         _77  Extended _78  Adequate Head Control    NECK _79  Rotated  Lt  _80  Lat Flexed Lt _81  Rotated  Rt _82  Lat Flexed Rt _83   Limited Head Control     _0  Cervical Hyperextension _1  Absent  Head Control     SHOULDERS ELBOWS WRIST& HAND ?????      Left     Right    Left     Right    Left     Right   U/E _2 Functional           _3 Functional WFL WFL _4 Fisting             _5 Fisting      _6 elev   _7 dep      _8 elev   _9 dep       _10 pro -_11 retract     _12 pro  _13 retract _14 subluxed             _15 subluxed           Goals for Wheelchair Mobility  _16  Independence with mobility in the home with motor related ADLs (MRADLs)  _17  Independence with MRADLs in the community _18  Provide dependent  mobility  _19  Provide recline     _20 Provide tilt   Goals for Seating system _21  Optimize pressure distribution _22  Provide support needed to facilitate function or safety _23  Provide corrective forces to assist with maintaining or improving posture _24  Accommodate client's posture:   current seated postures and positions are not flexible or will not tolerate corrective forces _25  Client to be independent with relieving pressure in the wheelchair _26 Enhance physiological function such as breathing, swallowing, digestion  Simulation ideas/Equipment trials:????? State why other equipment was unsuccessful:?????   MOBILITY BASE RECOMMENDATIONS and JUSTIFICATION: MOBILITY COMPONENT JUSTIFICATION  Manufacturer: Amy SystemsModel: Altrac M3 HD   Size: Width 24Seat Depth 22 _27 provide transport from point A to B      _28 promote Indep mobility  _29 is not a safe, functional ambulator _30 walker or cane inadequate _31 non-standard width/depth necessary to accommodate anatomical measurement _32  ?????  _33 Manual Mobility Base _34 non-functional ambulator    _35 Scooter/POV  _36 can safely operate  _37 can safely transfer   _38 has adequate trunk stability  _39 cannot functionally propel manual w/c  _40 Power Mobility Base  _41 non-ambulatory  _42 cannot functionally propel manual wheelchair  _43  cannot functionally and safely operate scooter/POV _44 can safely operate and willing to  _45 Stroller Base _46 infant/child  _47 unable to propel manual wheelchair _48 allows for growth _49 non-functional ambulator _50 non-functional UE _51 Indep mobility is not a goal at this time  _52 Tilt  _53 Forward _54 Backward _55 Powered tilt  _56 Manual tilt  _57 change position against gravitational force on head and shoulders  _58 change position for pressure relief/cannot weight shift _59 transfers  _60 management of tone _61 rest periods _62 control edema _63 facilitate postural control  _64  ?????  _65 Recline  _66 Power recline on power  base _67 Manual recline on manual base  _68 accommodate femur to back angle  _69 bring to full recline for ADL care  _70 change position for pressure relief/cannot weight shift _71 rest periods _72 repositioning for transfers or clothing/diaper /catheter changes _73 head positioning  _74 Lighter weight required _75 self- propulsion  _76 lifting _77  ?????  _78 Heavy Duty required _79 user weight greater than 250# _80 extreme tone/ over active movement _81 broken frame on previous chair _82  ?????  _83  Back  _84  Angle Adjustable _85  Custom molded Amy Back Contoured _86 postural control _87 control of tone/spasticity _88 accommodation of range of motion _89 UE functional control _90 accommodation for seating system _91  ????? _92 provide lateral trunk support _93 accommodate deformity _94 provide posterior trunk support _95 provide lumbar/sacral support _96 support trunk in midline _97 Pressure relief over spinal processes  _98  Seat Cushion [employer] Skin protection and positioning _99 impaired sensation  _100 decubitus ulcers present _101 history of pressure ulceration _102 prevent pelvic extension _103 low  maintenance  _0 stabilize pelvis  _1 accommodate obliquity _2 accommodate multiple deformity _3 neutralize lower extremity position _4 increase pressure distribution _5  ?????  _6  Pelvic/thigh support  _7  Lateral thigh guide _8  Distal medial pad  _9  Distal lateral pad _10  pelvis in neutral _11 accommodate pelvis _12  position upper legs _13  alignment _14  accommodate ROM _15  decr adduction _16 accommodate tone _17 removable for transfers _18 decr abduction  _19  Lateral trunk Supports _20  Lt     _21  Rt _22 decrease lateral trunk leaning _23 control tone _24 contour for increased contact _25 safety  _26 accommodate asymmetry _27  ?????  _28  Mounting hardware  _29 lateral trunk supports  _30 back   _31 seat _32 headrest      _33  thigh support _34 fixed   _35 swing away _36 attach seat platform/cushion to w/c frame _37 attach back cushion to w/c  frame _38 mount postural supports _39 mount headrest  _40 swing medial thigh support away _41 swing lateral supports away for transfers  _42  ?????    Armrests  _43 fixed _44 adjustable height _45 removable   _46 swing away  _47 flip back   _48 reclining _49 full length pads _50 desk    _51 pads tubular  _52 provide support with elbow at 90   _53 provide support for w/c tray _54 change of height/angles for variable activities _55 remove for transfers _56 allow to come closer to table top _57 remove for access to tables _58  ?????  Hangers/ Leg rests  _59 60 _60 70 _61 90 _62 elevating _63 heavy duty  _64 articulating _65 fixed _66 lift off _67 swing away     _68 power _69 provide LE support  _70 accommodate to hamstring tightness _71 elevate legs during recline   _72 provide change in position for Legs _73 Maintain placement of feet on footplate _74 durability _75 enable transfers _76 decrease edema _77 Accommodate lower leg length _78  ?????  Foot support Footplate    <XTGGYIRSWNIOEVOJ>_5<\/KKXFGHWEXHBZJIRC>_78 Lt  _80  Rt  _81  Center mount _82 flip up     _83 depth/angle adjustable _84 Amputee adapter    _85  Lt     _86  Rt _87 provide foot support _88 accommodate to ankle ROM _89 transfers _90 Provide support for residual extremity _91  allow foot to go under wheelchair base _92  decrease tone  _93  ?????  _94  Ankle strap/heel loops _95 support foot on foot support _96 decrease extraneous movement _97 provide input to heel  _98 protect foot  Tires: _99 pneumatic  _100 flat free inserts  _101 solid  _102 decrease maintenance  _103 prevent frequent flats _104 increase shock absorbency _105 decrease pain from road shock _106 decrease spasms from road shock _107  ?????  _108  Headrest  _109 provide posterior head support _110 provide posterior neck support _111 provide lateral head support _112 provide anterior head support _113 support during tilt and recline _114 improve feeding   _115 improve respiration _116 placement of switches _117 safety  _118 accommodate ROM  _119 accommodate tone _120 improve visual orientation  _121  Anterior chest strap _122   Vest _123  Shoulder retractors  _124 decrease forward movement of shoulder _125 accommodation of TLSO _126 decrease forward movement of trunk _127 decrease shoulder elevation _128 added abdominal support _129 alignment _130 assistance with shoulder control  _131  ?????  Pelvic Positioner _132 Belt _133 SubASIS bar _134 Dual Pull _135 stabilize tone _136 decrease falling out of chair/ **will not Decr potential for sliding due to pelvic tilting _137 prevent excessive rotation _138 pad for protection over boney prominence _139 prominence comfort _140 special pull angle to control rotation _141  ?????  Upper Extremity Support _142 L   _143  R _144 Arm trough    _145 hand support _146  tray       _147 full tray _148 swivel mount _149 decrease edema      _150 decrease subluxation   _151 control tone   _152 placement for AAC/Computer/EADL _153 decrease gravitational pull on shoulders _154 provide midline positioning _155 provide support to increase UE function _156 provide hand support in natural position _157 provide work surface   POWER WHEELCHAIR CONTROLS  _158 Proportional  _159 Non-Proportional Type Joystick _160 Left  _161 Right [  x]provides access for controlling wheelchair   _0 lacks motor control to operate proportional drive control <[id number]>_1<\/GGYIRSWNIOEVOJJK>_0 unable to understand proportional controls  Actuator Control Module  _2 Single  _3 Multiple   _4 Allow the client to operate the power seat function(s) through the joystick control   _5 Safety Reset Switches _6 Used to change modes and stop the wheelchair when driving in latch mode    _7 Upgraded Electronics   _8 programming for accurate control _9 progressive Disease/changing condition _10 non-proportional drive control needed _11 Needed in order to operate power seat functions through joystick control   _12 Display box _13 Allows user to see in which mode and drive the wheelchair is set  _14 necessary for alternate controls    _15 Digital interface electronics _16 Allows w/c to operate when using alternative drive controls  <XFGHWEXHBZJIRCVE>_9<\/FYBOFBPZWCHENIDP>_82 ASL Head Array _18 Allows client to  operate wheelchair  through switches placed in tri-panel headrest  _19 Sip and puff with tubing kit _20 needed to operate sip and puff drive controls  <UMPNTIRWERXVQMGQ>_6<\/PYPPJKDTOIZTIWPY>_09 Upgraded tracking electronics _22 increase safety when driving <XIPJASNKNLZJQBHA>_1<\/PFXTKWIOXBDZHGDJ>_24 correct tracking when on uneven surfaces  _24 [facility] for switches or joystick _25 Attaches switches to w/c  _26 Swing away for access or transfers _27 midline for optimal placement _28 provides for consistent access  _29 Attendant controlled joystick plus mount _30 safety _31 long distance driving <QASTMHDQQIWLNLGX>_2<\/JJHERDEYCXKGYJEH>_63 operation of seat functions _33 compliance with transportation regulations _34  ?????    Rear wheel placement/Axle adjustability _35 None _36 semi adjustable _37 fully adjustable  _38 improved UE access to wheels _39 improved stability _40 changing angle in space for improvement of postural stability _41 1-arm drive access <JSHFWYOVZCHYIFOY>_7<\/XAJOINOMVEHMCNOB>_09 amputee pad placement _43  ?????  Wheel rims/ hand rims  _44 metal  _45 plastic coated _46 oblique projections _47 vertical projections _48 Provide ability to propel manual wheelchair  _49  Increase self-propulsion with hand weakness/decreased grasp  Push handles _50 extended  _51 angle adjustable  _52 standard _53 caregiver access _54 caregiver assist _55 allows "hooking" to enable increased ability to perform ADLs or maintain balance  One armed device  _56 Lt   _57 Rt _58 enable propulsion of manual wheelchair with one arm   _59  ?????   Brake/wheel lock extension _60  Lt   _61  Rt _62 increase indep in applying wheel locks   _63 Side guards _64 prevent clothing getting caught in wheel or becoming soiled _65  prevent skin tears/abrasions  Battery: Group 24 x 2 _66 to power wheelchair ?????  Other: 1. Padded Foot Boxes 2. Seat Elevator 1. reduce the risk of skin breakdown and/or injury due to periods of high tone or continual contact with the hangers and footplates   2. raise and lower the seat height independently, increasing vertical reach to promote independence with MRADLs   The above equipment has a life- long use expectancy. Growth  and changes in medical and/or functional conditions would be the exceptions. This is to certify that the therapist has no financial relationship with durable medical provider or manufacturer. The therapist will not receive remuneration of any kind for the equipment recommended in this evaluation.   Patient has mobility limitation that significantly impairs safe, timely participation in one or more mobility related ADL's.  (bathing, toileting, feeding, dressing, grooming, moving from room to room)                                                             _67  Yes _68  No Will mobility device sufficiently improve ability to participate and/or be aided in participation of MRADL's?         _69  Yes _70   No Can limitation be compensated for with use of a cane or walker?                                                                                _0  Yes _1  No Does patient or caregiver demonstrate ability/potential ability & willingness to safely use the mobility device?   _2  Yes _3  No Does patient's home environment support use of recommended mobility device?                                                    _4  Yes _5  No Does patient have sufficient upper extremity function necessary to functionally propel a manual wheelchair?    _6  Yes _7  No Does patient have sufficient strength and trunk stability to safely operate a POV (scooter)?                                  _8  Yes _9  No Does patient need additional features/benefits provided by a power wheelchair for MRADL's in the home?       _10  Yes _11  No Does the patient demonstrate the ability to safely use a power wheelchair?                                                              _12  Yes _13  No  Therapist Name Printed: Tilda Burrow. Melrose Nakayama, PT, DPT Date: 11/12/19  Therapist's Signature:   Date:   Supplier's Name Printed: [employer] Date: 11/12/2019  Supplier's Signature:   Date:  Patient/Caregiver Signature:   Date:     This is to certify that  I have read this evaluation and do agree with the content within:      Physician's Name Printed: Clovia Cuff, MD  49 Signature:  Date:     This is to certify that I, the above signed therapist have the following affiliations: _14  This DME provider _15  Manufacturer of recommended equipment _16  Patient's long term care facility _17  None of the above       Objective measurements completed on examination: See above findings.               PT Education - 11/12/19 2031    Education Details Process for obtaining power mobility; will request order from physician for outpatient PT treatment    Person(s) Educated Patient    Methods Explanation    Comprehension Verbalized understanding                       Plan - 11/12/19 2033    Clinical Impression Statement Pt is a 59 year old female referred to outpatient neurorehabilitation for power mobility evaluation due to paraplegia following 2 major falls and lumbar decompressive laminectomy and microdiscectomy  T10-T11.  Pt's PMH is significant for: Bronchitis, DJD and OA of bilat knees, depression, GERD, hernia, HLD, HTN, lipoma, obesity, OSA with CPAP, acute hypoxemic respiratory failure, back pain.  The following deficits were noted during pt's exam: T10-T11 paraplegia with absent sensation, muscle paralysis, hypertonicity and spasticity, mid back pain, LE neuropathic pain, impaired posture and postural control, impaired sitting balance and impaired sitting tolerance.  Pt would benefit from a power wheelchair with power seat functions and ELR to allow pt to be independent with MRADL in her home, independent with pressure relief and repositioning, decrease risk for falls and to decrease caregiver burden of care    Personal Factors and Comorbidities Comorbidity 3+;Fitness;Time since onset of injury/illness/exacerbation;Past/Current Experience    Comorbidities Bronchitis, DJD and OA of bilat knees, depression, GERD,  hernia, HLD, HTN, lipoma, obesity, OSA with CPAP, acute hypoxemic respiratory failure, back pain, lumbar laminectomy/decompression microdiscectomy T10-T11    Examination-Activity Limitations Bathing;Bed Mobility;Dressing;Locomotion Level;Sit;Toileting;Transfers    Examination-Participation Restrictions Community Activity;Meal Prep    Stability/Clinical Decision Making Evolving/Moderate complexity    Clinical Decision Making Moderate    Rehab Potential Good    PT Frequency One time visit    PT Duration Other (comment)   one visit for w/c eval only   Consulted and Agree with Plan of Care Patient           Patient will benefit from skilled therapeutic intervention in order to improve the following deficits and impairments:  Decreased balance, Decreased mobility, Decreased range of motion, Decreased strength, Impaired sensation, Impaired tone, Postural dysfunction, Obesity, Pain  Visit Diagnosis: Paraplegia, incomplete (HCC)  Other disturbances of skin sensation  Abnormal posture  Muscle weakness (generalized)  Other symptoms and signs involving the nervous system     Problem List Patient Active Problem List   Diagnosis Date Noted  . Neurogenic bladder 01/27/2019  . Pyelonephritis 01/26/2019  . Paraplegia ([place]) 08/09/2018  . Back pain 06/17/2018  . OSA (obstructive sleep apnea) 06/17/2018  . Hypercalcemia 06/17/2018  . Anxiety 04/06/2017  . Acute respiratory failure with hypoxia ([place])   . Acute hypoxemic respiratory failure ([place]) 04/05/2017  . SIRS (systemic inflammatory response syndrome) ([place]) 04/05/2017  . Elevated troponin 04/05/2017  . Hyponatremia 04/05/2017  . Urgency of micturation 04/14/2015  . Dizziness 04/14/2015  . Neck pain 03/31/2015  . Abnormal uterine bleeding 08/29/2012  . Incarcerated ventral hernia 09/11/2010  . 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 LIPOMA 01/08/2006  . Hyperlipidemia 01/08/2006  . Obesity, morbid, BMI 50 or higher ([place]) 01/08/2006    . DEPRESSION 01/08/2006  . Essential hypertension 01/08/2006    Rico Junker, PT, DPT 11/12/19    8:49 PM    [facility] [address] [place], [place], [address] Phone: [phone]   Fax:  [phone]  Name: VIOLETA LECOUNT MRN: [id number] Date of Birth: 1960-10-23

## 2019-12-02 ENCOUNTER — Ambulatory Visit: Payer: Medicare HMO | Admitting: Rehabilitation

## 2019-12-09 ENCOUNTER — Ambulatory Visit: Payer: Medicare HMO | Admitting: Rehabilitation

## 2019-12-16 ENCOUNTER — Other Ambulatory Visit: Payer: Self-pay

## 2019-12-16 ENCOUNTER — Ambulatory Visit: Payer: Medicare HMO | Attending: Physician Assistant | Admitting: Rehabilitation

## 2019-12-16 ENCOUNTER — Encounter: Payer: Self-pay | Admitting: Rehabilitation

## 2019-12-16 DIAGNOSIS — R293 Abnormal posture: Secondary | ICD-10-CM | POA: Diagnosis present

## 2019-12-16 DIAGNOSIS — R2689 Other abnormalities of gait and mobility: Secondary | ICD-10-CM | POA: Insufficient documentation

## 2019-12-16 DIAGNOSIS — R29818 Other symptoms and signs involving the nervous system: Secondary | ICD-10-CM | POA: Diagnosis present

## 2019-12-16 DIAGNOSIS — M6281 Muscle weakness (generalized): Secondary | ICD-10-CM | POA: Diagnosis present

## 2019-12-16 DIAGNOSIS — G8222 Paraplegia, incomplete: Secondary | ICD-10-CM | POA: Insufficient documentation

## 2019-12-16 DIAGNOSIS — R208 Other disturbances of skin sensation: Secondary | ICD-10-CM | POA: Diagnosis present

## 2019-12-16 NOTE — Therapy (Signed)
[facility] [address] [place], [place], [address] Phone: [phone]   Fax:  [phone]  Physical Therapy Evaluation  Patient Details  Name: Ellen [name] MRN: [id number] Date of Birth: Jan 01, 1961 Referring Provider (PT): Clovia Cuff, MD   Encounter Date: 12/16/2019   PT End of Session - 12/16/19 1635    Visit Number 1    Number of Visits 17    Date for PT Re-Evaluation 02/14/20    Authorization Type [employer] Medicare and Medicaid (10th visit PN needed)    Progress Note Due on Visit 10    PT Start Time 1533    PT Stop Time 1618    PT Time Calculation (min) 45 min    Equipment Utilized During Treatment Other (comment)   hoyer   Activity Tolerance Patient tolerated treatment well    Behavior During Therapy [facility] for tasks assessed/performed           Past Medical History:  Diagnosis Date  . Bronchitis    hx of  . Degenerative joint disease   . Depression   . ENDOMETRIAL POLYP 01/08/2006   Dr. Agnes Lawrence.  2001:  D&C, benign;  07/14/10:  D&C, hysteroscopy, polypectomy and bx of mons pubis (peau d'orange)...... 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  Marland Kitchen GERD (gastroesophageal reflux disease)   . Hernia   . High cholesterol   . Hypertension   . Lipoma   . Obesity   . Sleep apnea    uses cpap    Past Surgical History:  Procedure Laterality Date  . CHOLECYSTECTOMY    . DILATION AND CURETTAGE OF UTERUS    . LUMBAR LAMINECTOMY/DECOMPRESSION MICRODISCECTOMY N/A 06/19/2018   Procedure: THORACIC TEN-THORACIC ELEVEN  LAMINECTOMY;  Surgeon: Consuella Lose, MD;  Location: [facility];  Service: Neurosurgery;  Laterality: N/A;  posterior  . resection of lipoma    . UMBILICAL HERNIA REPAIR      There were no vitals filed for this visit.    Subjective Assessment - 12/16/19 1632    Subjective Pt returns to clinic (was initially seen in Feb of this year) with goals to work on bed  mobility, sitting balance and eventually transfers.  She has paraplegia from 2 major falls with T10-11 decompression laminectomy.    Pertinent History Bronchitis, DJD and OA of bilat knees, depression, GERD, hernia, HLD, HTN, lipoma, obesity, OSA with CPAP, acute hypoxemic respiratory failure, back pain    Limitations Sitting;House hold activities    How long can you sit comfortably? 2-3 hours at a time    Patient Stated Goals To increase independence with mobility    Currently in Pain? Yes    Pain Score 8     Pain Location Leg    Pain Orientation Left;Upper    Pain Descriptors / Indicators Aching;Tightness    Pain Type Chronic pain    Pain Onset More than a month ago    Pain Frequency Constant    Aggravating Factors  prolonged sitting, immobility    Pain Relieving Factors n/a              [facility] PT Assessment - 12/16/19 1551      Assessment   Medical Diagnosis Paraplegia    Referring Provider (PT) Clovia Cuff, MD    Onset Date/Surgical Date 06/19/18    Hand Dominance Left    Prior Therapy SNF, HH      Precautions   Precautions Other (comment)    Precaution Comments  Bronchitis, DJD and OA of bilat knees, depression, GERD, hernia, HLD, HTN, lipoma, obesity, OSA with CPAP, acute hypoxemic respiratory failure, back pain, lumbar laminectomy/decompression microdiscectomy T10-T11      Balance Screen   Has the patient fallen in the past 6 months No    Has the patient had a decrease in activity level because of a fear of falling?  Yes    Is the patient reluctant to leave their home because of a fear of falling?  Yes      [place] Private residence    Living Arrangements Spouse/significant other    Available Help at Discharge Family;Available 24 hours/day    Type of Home House    Home Access Level entry    Home Layout One level    Home Equipment Wheelchair - manual;Hospital bed;Other (comment);Bedside commode    Additional Comments Pt takes sponge  bath      Prior Function   Level of Independence Independent    Leisure anything and everything       Cognition   Overall Cognitive Status Within Functional Limits for tasks assessed      Sensation   Light Touch Impaired Detail    Light Touch Impaired Details Absent RLE;Absent LLE   from T10/11 down    Hot/Cold Appears Intact      ROM / Strength   AROM / PROM / Strength AROM;Strength      AROM   Overall AROM  Deficits    Overall AROM Comments L: hip ABD 30 deg; lacking 10 deg to neutral hip extension, L: lacking 50 deg to full knee extension, 90 deg DF bilaterally      Strength   Overall Strength Deficits    Overall Strength Comments R ankle DF 2/5, R hip flex 1/5, all other motions 0/5      Bed Mobility   Bed Mobility Rolling Right;Rolling Left;Supine to Sit;Sit to Supine    Rolling Right Moderate Assistance - Patient 50-74%   PT acting as bed rail    Rolling Left Maximal Assistance - Patient 25-49%;Total Assistance - Patient < 25%   With PT acting as bed rail    Supine to Sit 2 Helpers    Sit to Supine 2 Helpers      Ambulation/Gait   Ambulation/Gait No                      Objective measurements completed on examination: See above findings.               PT Education - 12/16/19 1634    Education Details Discussion of evaluation findings, goals, POC    Person(s) Educated Patient    Methods Explanation    Comprehension Verbalized understanding            PT Short Term Goals - 12/16/19 1640      PT SHORT TERM GOAL #1   Title Pt will perform HEP with caregiver assist in order to improve balance, strength, and flexibility. TARGET DATE FOR ALL STGS: 01/15/20    Baseline No HEP    Time 4    Period Weeks    Status New    Target Date 01/15/20      PT SHORT TERM GOAL #2   Title Pt will perform rolling to R and L side with max A (without rails) to improve functional mobility.    Baseline +2 total assist    Time 4  Period Weeks     Status New      PT SHORT TERM GOAL #3   Title Perform sitting balance assessment and write goals as indicated.    Baseline Not yet performed.    Time 4    Period Weeks    Status New      PT SHORT TERM GOAL #4   Title Pt will perform prop sitting at min A level x 5 mins to work on sitting tolerance.    Baseline unable to assess    Time 4    Period Weeks    Status New             PT Long Term Goals - 12/16/19 1642      PT LONG TERM GOAL #1   Title Pt will perform rolling from R and L side with mod A to improve functional mobility. TARGET DATE FOR ALL LTGS: 02/14/20    Baseline + 2 assist    Time 8    Period Weeks    Status New    Target Date 02/14/20      PT LONG TERM GOAL #2   Title Pt will perform supine>side>sit with HOB elevated slightly with mod A to improve functional mobility.    Baseline Dependent on Hoyer lift    Time 8    Period Weeks    Status New      PT LONG TERM GOAL #3   Title Write goal for w/c mobility once pt has new w/c    Time 8    Period Weeks    Status New      PT LONG TERM GOAL #4   Title Pt will sit unsupported (feet supported) with single UE only while performing simulated ADL tasks at mod A level in order to improve independence at home.    Baseline unable to assess    Time 8    Period Weeks    Status New                  Plan - 12/16/19 1636    Clinical Impression Statement Pt is a 59 year old female referred to outpatient neurorehabilitation for mobility evaluation due to paraplegia following 2 major falls and lumbar decompressive laminectomy and microdiscectomy T10-T11. Pt's PMH is significant for: Bronchitis, DJD and OA of bilat knees, depression, GERD, hernia, HLD, HTN, lipoma, obesity, OSA with CPAP, acute hypoxemic respiratory failure, back pain. The following deficits were noted during pt's exam: T10-T11 paraplegia with absent sensation, muscle paralysis, hypertonicity and spasticity, mid back pain, LE neuropathic pain,  impaired posture and postural control, impaired sitting balance and impaired sitting tolerance.  Pt will benefit from skilled OP neuro PT in order to address deficits.    Personal Factors and Comorbidities Comorbidity 3+;Fitness;Time since onset of injury/illness/exacerbation;Past/Current Experience    Comorbidities Bronchitis, DJD and OA of bilat knees, depression, GERD, hernia, HLD, HTN, lipoma, obesity, OSA with CPAP, acute hypoxemic respiratory failure, back pain, lumbar laminectomy/decompression microdiscectomy T10-T11    Examination-Activity Limitations Bathing;Bed Mobility;Dressing;Locomotion Level;Sit;Toileting;Transfers;Hygiene/Grooming;Reach Overhead    Examination-Participation Restrictions Community Activity;Meal Prep    Stability/Clinical Decision Making Evolving/Moderate complexity    Clinical Decision Making Moderate    Rehab Potential Good    PT Frequency 2x / week    PT Duration 8 weeks    PT Treatment/Interventions ADLs/Self Care Home Management;Electrical Stimulation;Functional mobility training;Therapeutic activities;Therapeutic exercise;Balance training;Neuromuscular re-education;Patient/family education;Orthotic Fit/Training;Wheelchair mobility training;Passive range of motion;Energy conservation    PT Next Visit Plan Assess  sitting balance and update goal as needed, provide HEP for BLE stretching/ROM (if husband present), bed mobility (breaking down tasks and working on SL to prop on elbow, etc)    Consulted and Agree with Plan of Care Patient           Patient will benefit from skilled therapeutic intervention in order to improve the following deficits and impairments:  Decreased balance, Decreased mobility, Decreased range of motion, Decreased strength, Impaired sensation, Impaired tone, Postural dysfunction, Obesity, Pain, Decreased activity tolerance, Decreased endurance, Hypomobility, Impaired flexibility, Impaired perceived functional ability, Increased muscle  spasms  Visit Diagnosis: Paraplegia, incomplete (HCC)  Other disturbances of skin sensation  Abnormal posture  Muscle weakness (generalized)  Other abnormalities of gait and mobility  Other symptoms and signs involving the nervous system     Problem List Patient Active Problem List   Diagnosis Date Noted  . Neurogenic bladder 01/27/2019  . Pyelonephritis 01/26/2019  . Paraplegia ([place]) 08/09/2018  . Back pain 06/17/2018  . OSA (obstructive sleep apnea) 06/17/2018  . Hypercalcemia 06/17/2018  . Anxiety 04/06/2017  . Acute respiratory failure with hypoxia ([place])   . Acute hypoxemic respiratory failure ([place]) 04/05/2017  . SIRS (systemic inflammatory response syndrome) ([place]) 04/05/2017  . Elevated troponin 04/05/2017  . Hyponatremia 04/05/2017  . Urgency of micturation 04/14/2015  . Dizziness 04/14/2015  . Neck pain 03/31/2015  . Abnormal uterine bleeding 08/29/2012  . Incarcerated ventral hernia 09/11/2010  . 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 LIPOMA 01/08/2006  . Hyperlipidemia 01/08/2006  . Obesity, morbid, BMI 50 or higher ([place]) 01/08/2006  . DEPRESSION 01/08/2006  . Essential hypertension 01/08/2006    Cameron Sprang, PT, MPT [facility] [address] [place], [place], [address] Phone: [phone]   Fax:  [phone] 12/16/19, 4:45 PM  Name: NATHALYA WOLANSKI MRN: [id number] Date of Birth: [birth year]

## 2019-12-30 ENCOUNTER — Ambulatory Visit: Payer: Medicare HMO | Admitting: Rehabilitation

## 2020-01-08 ENCOUNTER — Ambulatory Visit: Payer: Medicare HMO | Attending: Physician Assistant

## 2020-01-12 ENCOUNTER — Ambulatory Visit: Payer: Medicare HMO

## 2020-01-20 ENCOUNTER — Ambulatory Visit: Payer: Medicare HMO | Admitting: Rehabilitation

## 2020-01-27 ENCOUNTER — Telehealth: Payer: Self-pay | Admitting: Rehabilitation

## 2020-01-27 ENCOUNTER — Ambulatory Visit: Payer: Medicare HMO | Attending: Physician Assistant | Admitting: Rehabilitation

## 2020-01-27 ENCOUNTER — Other Ambulatory Visit: Payer: Self-pay

## 2020-01-27 DIAGNOSIS — R208 Other disturbances of skin sensation: Secondary | ICD-10-CM | POA: Diagnosis present

## 2020-01-27 DIAGNOSIS — R2689 Other abnormalities of gait and mobility: Secondary | ICD-10-CM | POA: Diagnosis present

## 2020-01-27 DIAGNOSIS — G8222 Paraplegia, incomplete: Secondary | ICD-10-CM | POA: Insufficient documentation

## 2020-01-27 DIAGNOSIS — R29818 Other symptoms and signs involving the nervous system: Secondary | ICD-10-CM | POA: Insufficient documentation

## 2020-01-27 DIAGNOSIS — R293 Abnormal posture: Secondary | ICD-10-CM | POA: Diagnosis present

## 2020-01-27 DIAGNOSIS — M6281 Muscle weakness (generalized): Secondary | ICD-10-CM | POA: Diagnosis present

## 2020-01-27 NOTE — Patient Instructions (Signed)
Access Code: [email or web address] URL: [email or web address] Date: 01/27/2020 Prepared by: Cameron Sprang  Exercises Supine Ankle Dorsiflexion Stretch with Caregiver - 3 x daily - 7 x weekly - 1 sets - 10 reps Foot Dorsiflexion PROM Caregiver - 3 x daily - 7 x weekly - 1 sets - 3 reps - 30-60 secs hold Supine Hip and Knee Flexion PROM with Caregiver - 3 x daily - 7 x weekly - 1 sets - 10 reps Hip External and Internal Rotation with Compression Garment - 3 x daily - 7 x weekly - 3 sets - 10 reps

## 2020-01-27 NOTE — Telephone Encounter (Signed)
Dr. Daphene Jaeger,   I am seeing Mrs. Ellen [name] here at [facility] neuro for PT.  I have seen her for 2 visits and upon getting her onto our therapy mat, she is greatly limited by LE flexor tone (LLE>RLE) and this is also causing extreme pain.  We have provided an HEP for stretching and ROM, however I feel that she may need more medication management for her tone esp as we begin to work on sitting balance.  I believe she is on Baclofen but I am unsure of the dose.  Please advise to any recommendations.   Thanks Cameron Sprang, PT, MPT [facility] [address] [place], [place], [address] Phone: [phone]   Fax:  [phone] 01/27/20, 6:19 PM

## 2020-01-27 NOTE — Therapy (Signed)
[facility] [address] [place], [place], [address] Phone: [phone]   Fax:  [phone]  Physical Therapy Treatment  Patient Details  Name: Ellen [name] MRN: [id number] Date of Birth: 1960-07-26 Referring Provider (PT): Clovia Cuff, MD   Encounter Date: 01/27/2020   PT End of Session - 01/27/20 1806    Visit Number 2    Number of Visits 17    Date for PT Re-Evaluation 02/14/20    Authorization Type [employer] Medicare and Medicaid (10th visit PN needed)    Progress Note Due on Visit 10    PT Start Time 1640    PT Stop Time 1740    PT Time Calculation (min) 60 min    Equipment Utilized During Treatment Other (comment)   hoyer   Activity Tolerance Patient tolerated treatment well    Behavior During Therapy [facility] for tasks assessed/performed           Past Medical History:  Diagnosis Date  . Bronchitis    hx of  . Degenerative joint disease   . Depression   . ENDOMETRIAL POLYP 01/08/2006   Dr. Agnes Lawrence.  2001:  D&C, benign;  07/14/10:  D&C, hysteroscopy, polypectomy and bx of mons pubis (peau d'orange)...... 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  Marland Kitchen GERD (gastroesophageal reflux disease)   . Hernia   . High cholesterol   . Hypertension   . Lipoma   . Obesity   . Sleep apnea    uses cpap    Past Surgical History:  Procedure Laterality Date  . CHOLECYSTECTOMY    . DILATION AND CURETTAGE OF UTERUS    . LUMBAR LAMINECTOMY/DECOMPRESSION MICRODISCECTOMY N/A 06/19/2018   Procedure: THORACIC TEN-THORACIC ELEVEN  LAMINECTOMY;  Surgeon: Consuella Lose, MD;  Location: [facility];  Service: Neurosurgery;  Laterality: N/A;  posterior  . resection of lipoma    . UMBILICAL HERNIA REPAIR      There were no vitals filed for this visit.                      [facility] Adult PT Treatment/Exercise - 01/27/20 1740      Bed Mobility   Bed Mobility Rolling Right;Rolling  Left;Left Sidelying to Sit;Sit to Sidelying Left    Rolling Right Minimal Assistance - Patient > 75%    Rolling Left Moderate Assistance - Patient 50-74%    Left Sidelying to Sit 2 Helpers    Sit to Sidelying Left 2 Helpers    Sit to Sidelying Left Details (indicate cue type and reason) Worked on rolling R and L x 4 reps L and 2 reps R during session with cues and demo for use of momentum and swinging arm across body with head following.  Note that to the R she is min A for management of LE and to the L with more mod A as she is unable to get as much momentum this direction.  Also worked on SL to sit from the L, needs assist for lowering legs off mat and then mod A to elevate trunk to sitting.         Transfers   Transfers --   Warehouse manager Assessed Yes      Static Sitting Balance   Static Sitting - Balance Support Feet supported;Bilateral upper extremity supported    Static Sitting - Level of Assistance 4: Min assist    Static Sitting -  Comment/# of Minutes Pt able to sit with BUE supported x 10-15 secs without posterior lean.  She is unable to sit without support more than a few secs       Dynamic Sitting Balance   Dynamic Sitting - Balance Support During functional activity;Feet supported;Right upper extremity supported;Left upper extremity supported    Dynamic Sitting - Level of Assistance 4: Min assist;3: Mod assist    Dynamic Sitting Balance - Compensations Had pt reach side to side with single UE support which she was able to do at min/mod A level.        Self-Care   Self-Care Other Self-Care Comments    Other Self-Care Comments  Discussed that sling lower straps should be crossed under leg for most support and safety as well as how to better assist into chair so that they aren't pulling sling so far out of place.  Provided education on propping legs into neutral hip alignment when in bed with use of pillows to decrease ER.        Exercises   Exercises Other  Exercises    Other Exercises  Went through BLE ROM/stretching program.  See pt instruction for details.                     PT Short Term Goals - 01/27/20 1809      PT SHORT TERM GOAL #1   Title Pt will perform HEP with caregiver assist in order to improve balance, strength, and flexibility. TARGET DATE FOR ALL STGS: 02/17/20-updated goal to reflect delay in care.    Baseline No HEP    Time 4    Period Weeks    Status New    Target Date 01/15/20      PT SHORT TERM GOAL #2   Title Pt will perform rolling to R and L side with max A (without rails) to improve functional mobility.    Baseline +2 total assist    Time 4    Period Weeks    Status New      PT SHORT TERM GOAL #3   Title Pt will sit at [facility] with feet supported and single UE support performing reaching tasks at min A level.    Baseline Not yet performed.    Time 4    Period Weeks    Status New      PT SHORT TERM GOAL #4   Title Pt will perform prop sitting at min A level x 5 mins to work on sitting tolerance.    Baseline unable to assess    Time 4    Period Weeks    Status New             PT Long Term Goals - 01/27/20 1810      PT LONG TERM GOAL #1   Title Pt will perform rolling from R and L side with mod A to improve functional mobility. TARGET DATE FOR ALL LTGS: 03/16/20-updated to reflect delay in care.    Baseline + 2 assist    Time 8    Period Weeks    Status New      PT LONG TERM GOAL #2   Title Pt will perform supine>side>sit with HOB elevated slightly with mod A to improve functional mobility.    Baseline Dependent on Hoyer lift    Time 8    Period Weeks    Status New      PT LONG TERM GOAL #3  Title Write goal for w/c mobility once pt has new w/c    Time 8    Period Weeks    Status New      PT LONG TERM GOAL #4   Title Pt will sit unsupported (feet supported) with single UE only while performing simulated ADL tasks at S level in order to improve independence at home.     Baseline unable to assess    Time 8    Period Weeks    Status New                 Plan - 01/27/20 1807    Clinical Impression Statement Pt arrived today with current sling, despite 2 loops having been cut.  We were able to utilize remaining loops for hoyer lift transfer.  Once on mat, worked on assessment of sitting balance which fluctuates between min and mod A when supported with single UE, rolling R and L, and issuing HEP for BLE ROM and flexibility.    Personal Factors and Comorbidities Comorbidity 3+;Fitness;Time since onset of injury/illness/exacerbation;Past/Current Experience    Comorbidities Bronchitis, DJD and OA of bilat knees, depression, GERD, hernia, HLD, HTN, lipoma, obesity, OSA with CPAP, acute hypoxemic respiratory failure, back pain, lumbar laminectomy/decompression microdiscectomy T10-T11    Examination-Activity Limitations Bathing;Bed Mobility;Dressing;Locomotion Level;Sit;Toileting;Transfers;Hygiene/Grooming;Reach Overhead    Examination-Participation Restrictions Community Activity;Meal Prep    Stability/Clinical Decision Making Evolving/Moderate complexity    Rehab Potential Good    PT Frequency 2x / week    PT Duration 8 weeks    PT Treatment/Interventions ADLs/Self Care Home Management;Electrical Stimulation;Functional mobility training;Therapeutic activities;Therapeutic exercise;Balance training;Neuromuscular re-education;Patient/family education;Orthotic Fit/Training;Wheelchair mobility training;Passive range of motion;Energy conservation    PT Next Visit Plan Go over HEP as needed, esp if husband present, bed mobility (breaking down tasks and working on SL to prop on elbow, etc)    Consulted and Agree with Plan of Care Patient           Patient will benefit from skilled therapeutic intervention in order to improve the following deficits and impairments:  Decreased balance, Decreased mobility, Decreased range of motion, Decreased strength, Impaired  sensation, Impaired tone, Postural dysfunction, Obesity, Pain, Decreased activity tolerance, Decreased endurance, Hypomobility, Impaired flexibility, Impaired perceived functional ability, Increased muscle spasms  Visit Diagnosis: Paraplegia, incomplete (HCC)  Other disturbances of skin sensation  Abnormal posture  Muscle weakness (generalized)  Other abnormalities of gait and mobility  Other symptoms and signs involving the nervous system     Problem List Patient Active Problem List   Diagnosis Date Noted  . Neurogenic bladder 01/27/2019  . Pyelonephritis 01/26/2019  . Paraplegia ([place]) 08/09/2018  . Back pain 06/17/2018  . OSA (obstructive sleep apnea) 06/17/2018  . Hypercalcemia 06/17/2018  . Anxiety 04/06/2017  . Acute respiratory failure with hypoxia ([place])   . Acute hypoxemic respiratory failure ([place]) 04/05/2017  . SIRS (systemic inflammatory response syndrome) ([place]) 04/05/2017  . Elevated troponin 04/05/2017  . Hyponatremia 04/05/2017  . Urgency of micturation 04/14/2015  . Dizziness 04/14/2015  . Neck pain 03/31/2015  . Abnormal uterine bleeding 08/29/2012  . Incarcerated ventral hernia 09/11/2010  . 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 LIPOMA 01/08/2006  . Hyperlipidemia 01/08/2006  . Obesity, morbid, BMI 50 or higher ([place]) 01/08/2006  . DEPRESSION 01/08/2006  . Essential hypertension 01/08/2006    Denice Bors 01/27/2020, [facility] [address] [place], [place], [address] Phone: [phone]   Fax:  [phone]  Name: PEGEEN STIGER MRN: [id number] Date of Birth: 1961/01/01

## 2020-02-01 ENCOUNTER — Telehealth: Payer: Self-pay | Admitting: Rehabilitation

## 2020-02-01 NOTE — Telephone Encounter (Signed)
Dr. Daphene Jaeger,   I am seeing Kathlene Cote here at [facility] for PT.  She was performing a hoyer lift transfer at home a few weeks ago and the straps needed to be cut as they got stuck in her w/c.  She will need a new sling to make transfers easier for [place] and caregivers.  Please write order in epic work cue for new Hoyer lift sling and I can provide this for her at next session.    Thanks,  Cameron Sprang, PT, MPT [facility] [address] [place], [place], [address] Phone: [phone]   Fax:  [phone] 02/01/20, 2:43 PM

## 2020-02-03 ENCOUNTER — Ambulatory Visit: Payer: Medicare HMO

## 2020-02-10 ENCOUNTER — Ambulatory Visit: Payer: Medicare HMO

## 2020-02-17 ENCOUNTER — Encounter: Payer: Self-pay | Admitting: Rehabilitation

## 2020-02-17 ENCOUNTER — Ambulatory Visit: Payer: Medicare HMO | Admitting: Rehabilitation

## 2020-02-17 ENCOUNTER — Other Ambulatory Visit: Payer: Self-pay

## 2020-02-17 DIAGNOSIS — M6281 Muscle weakness (generalized): Secondary | ICD-10-CM

## 2020-02-17 DIAGNOSIS — R293 Abnormal posture: Secondary | ICD-10-CM

## 2020-02-17 DIAGNOSIS — R208 Other disturbances of skin sensation: Secondary | ICD-10-CM

## 2020-02-17 DIAGNOSIS — G8222 Paraplegia, incomplete: Secondary | ICD-10-CM | POA: Diagnosis not present

## 2020-02-17 DIAGNOSIS — R2689 Other abnormalities of gait and mobility: Secondary | ICD-10-CM

## 2020-02-17 NOTE — Therapy (Signed)
[facility] [address] [place], [place], [address] Phone: [phone]   Fax:  [phone]  Physical Therapy Treatment  Patient Details  Name: Ellen [name] MRN: [id number] Date of Birth: 1960/07/08 Referring Provider (PT): Clovia Cuff, MD   Encounter Date: 02/17/2020   PT End of Session - 02/17/20 1813    Visit Number 3    Number of Visits 17    Date for PT Re-Evaluation 02/14/20    Authorization Type [employer] Medicare and Medicaid (10th visit PN needed)    Progress Note Due on Visit 10    PT Start Time 1550   only charged for 60 mins   PT Stop Time 1650    PT Time Calculation (min) 60 min    Equipment Utilized During Treatment Other (comment)   hoyer   Activity Tolerance Patient tolerated treatment well    Behavior During Therapy [facility] for tasks assessed/performed           Past Medical History:  Diagnosis Date  . Bronchitis    hx of  . Degenerative joint disease   . Depression   . ENDOMETRIAL POLYP 01/08/2006   Dr. Agnes Lawrence.  2001:  D&C, benign;  07/14/10:  D&C, hysteroscopy, polypectomy and bx of mons pubis (peau d'orange)...... 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  Marland Kitchen GERD (gastroesophageal reflux disease)   . Hernia   . High cholesterol   . Hypertension   . Lipoma   . Obesity   . Sleep apnea    uses cpap    Past Surgical History:  Procedure Laterality Date  . CHOLECYSTECTOMY    . DILATION AND CURETTAGE OF UTERUS    . LUMBAR LAMINECTOMY/DECOMPRESSION MICRODISCECTOMY N/A 06/19/2018   Procedure: THORACIC TEN-THORACIC ELEVEN  LAMINECTOMY;  Surgeon: Consuella Lose, MD;  Location: [facility];  Service: Neurosurgery;  Laterality: N/A;  posterior  . resection of lipoma    . UMBILICAL HERNIA REPAIR      There were no vitals filed for this visit.   Subjective Assessment - 02/17/20 1811    Subjective Pt reports continued pain in BLEs. Is going to MD tomorrow.     Pertinent History Bronchitis, DJD and OA of bilat knees, depression, GERD, hernia, HLD, HTN, lipoma, obesity, OSA with CPAP, acute hypoxemic respiratory failure, back pain    Limitations Sitting;House hold activities    Patient Stated Goals To increase independence with mobility    Currently in Pain? Yes    Pain Score 7     Pain Location Leg    Pain Orientation Right;Left    Pain Descriptors / Indicators Aching;Tightness    Pain Type Chronic pain    Pain Onset More than a month ago    Pain Frequency Constant    Aggravating Factors  prolonged sitting, immobility    Pain Relieving Factors n/a                             [facility] Adult PT Treatment/Exercise - 02/17/20 1804      Bed Mobility   Bed Mobility Rolling Right;Supine to Sit;Sit to Sidelying Right;Sit to Supine    Rolling Right Minimal Assistance - Patient > 75%    Supine to Sit Minimal Assistance - Patient > 75%   from supine to long sit   Sit to Supine Minimal Assistance - Patient > 75%   long sit to supine   Sit to Sidelying Right Minimal  Assistance - Patient > 75%      Transfers   Transfers --   hoyer lift     Dynamic Sitting Balance   Dynamic Sitting - Balance Support No upper extremity supported;During functional activity   in long sit   Dynamic Sitting - Level of Assistance 5: Stand by assistance;4: Min assist    Dynamic Sitting Balance - Compensations Performed single UE flexion x 10 reps on each side>B UE flexion x 10 reps, alternating punches (with both UEs off mat whole time) all with 3lb weight in each UE. Pt needing rest breaks between each exercise, working on going down to R elbow to supine and transitioning back to R elbow to sit (needs min A).      Self-Care   Self-Care Other Self-Care Comments    Other Self-Care Comments  Discussed placing pt on hold until she is able to get her new sling and new power w/c and the need for more consistent attendance as she has only been seen 2 times since  evaluation, making very little progress.  Pt also needs tone management and discussed the need for a night PRAFO as she is having skin breakdown on heels/pain on heels.  PT printed off example of PRAFO and gave to pt to provide to MD tomorrow at visit and also printed out note that PT had written to MD about tone management.  Pt verbalized understanding.      Exercises   Exercises Other Exercises    Other Exercises  In long sitting, had pt perform forward hamstring/hip stretch x 2 reps of 20 secs.  Also had pt reach forward in short sit x 2 reps of 10 secs.                    PT Short Term Goals - 02/17/20 1816      PT SHORT TERM GOAL #1   Title Pt will perform HEP with caregiver assist in order to improve balance, strength, and flexibility. TARGET DATE FOR ALL STGS: 02/17/20-updated goal to reflect delay in care.    Baseline They are doing at home per pt report    Time 4    Period Weeks    Status Achieved    Target Date 01/15/20      PT SHORT TERM GOAL #2   Title Pt will perform rolling to R and L side with max A (without rails) to improve functional mobility.    Baseline min to mod A each direction    Time 4    Period Weeks    Status Achieved      PT SHORT TERM GOAL #3   Title Pt will sit at [facility] with feet supported and single UE support performing reaching tasks at min A level.    Baseline min to mod A    Time 4    Period Weeks    Status Partially Met      PT SHORT TERM GOAL #4   Title Pt will perform prop sitting at min A level x 5 mins to work on sitting tolerance.    Baseline Met from a assistance level, however endurance is poor and can only sit upright for 2-3 mins at a time.    Time 4    Period Weeks    Status Partially Met             PT Long Term Goals - 01/27/20 1810      PT LONG TERM GOAL #1  Title Pt will perform rolling from R and L side with mod A to improve functional mobility. TARGET DATE FOR ALL LTGS: 03/16/20-updated to reflect delay in  care.    Baseline + 2 assist    Time 8    Period Weeks    Status New      PT LONG TERM GOAL #2   Title Pt will perform supine>side>sit with HOB elevated slightly with mod A to improve functional mobility.    Baseline Dependent on Hoyer lift    Time 8    Period Weeks    Status New      PT LONG TERM GOAL #3   Title Write goal for w/c mobility once pt has new w/c    Time 8    Period Weeks    Status New      PT LONG TERM GOAL #4   Title Pt will sit unsupported (feet supported) with single UE only while performing simulated ADL tasks at S level in order to improve independence at home.    Baseline unable to assess    Time 8    Period Weeks    Status New                 Plan - 02/17/20 1814    Clinical Impression Statement Skilled session focused on transitions from supine <> long sit, long sit balance activities and short sit.  Also discussed PRAFO for night positioning and pressure relief as well as providing request from PT regarding tone management. Will plan to place pt on hold until she gets new sling and power w/c and she can be more consistent with attendance to therapy.  She also has small surgery coming up due to prolonged vaginal bleeding.  Will allow all of these to take place before return to PT.    Personal Factors and Comorbidities Comorbidity 3+;Fitness;Time since onset of injury/illness/exacerbation;Past/Current Experience    Comorbidities Bronchitis, DJD and OA of bilat knees, depression, GERD, hernia, HLD, HTN, lipoma, obesity, OSA with CPAP, acute hypoxemic respiratory failure, back pain, lumbar laminectomy/decompression microdiscectomy T10-T11    Examination-Activity Limitations Bathing;Bed Mobility;Dressing;Locomotion Level;Sit;Toileting;Transfers;Hygiene/Grooming;Reach Overhead    Examination-Participation Restrictions Community Activity;Meal Prep    Stability/Clinical Decision Making Evolving/Moderate complexity    Rehab Potential Good    PT Frequency 2x /  week    PT Duration 8 weeks    PT Treatment/Interventions ADLs/Self Care Home Management;Electrical Stimulation;Functional mobility training;Therapeutic activities;Therapeutic exercise;Balance training;Neuromuscular re-education;Patient/family education;Orthotic Fit/Training;Wheelchair mobility training;Passive range of motion;Energy conservation    PT Next Visit Plan Placed on hold until has new sling and power w/c. Go over HEP as needed, esp if husband present, bed mobility (breaking down tasks and working on SL to prop on elbow, etc)    Consulted and Agree with Plan of Care Patient           Patient will benefit from skilled therapeutic intervention in order to improve the following deficits and impairments:  Decreased balance,Decreased mobility,Decreased range of motion,Decreased strength,Impaired sensation,Impaired tone,Postural dysfunction,Obesity,Pain,Decreased activity tolerance,Decreased endurance,Hypomobility,Impaired flexibility,Impaired perceived functional ability,Increased muscle spasms  Visit Diagnosis: Other disturbances of skin sensation  Abnormal posture  Muscle weakness (generalized)  Other abnormalities of gait and mobility     Problem List Patient Active Problem List   Diagnosis Date Noted  . Neurogenic bladder 01/27/2019  . Pyelonephritis 01/26/2019  . Paraplegia ([place]) 08/09/2018  . Back pain 06/17/2018  . OSA (obstructive sleep apnea) 06/17/2018  . Hypercalcemia 06/17/2018  . Anxiety 04/06/2017  .  Acute respiratory failure with hypoxia ([place])   . Acute hypoxemic respiratory failure ([place]) 04/05/2017  . SIRS (systemic inflammatory response syndrome) ([place]) 04/05/2017  . Elevated troponin 04/05/2017  . Hyponatremia 04/05/2017  . Urgency of micturation 04/14/2015  . Dizziness 04/14/2015  . Neck pain 03/31/2015  . Abnormal uterine bleeding 08/29/2012  . Incarcerated ventral hernia 09/11/2010  . 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 LIPOMA 01/08/2006  .  Hyperlipidemia 01/08/2006  . Obesity, morbid, BMI 50 or higher ([place]) 01/08/2006  . DEPRESSION 01/08/2006  . Essential hypertension 01/08/2006    Cameron Sprang, PT, MPT [facility] [address] [place], [place], [address] Phone: [phone]   Fax:  [phone] 02/17/20, 6:19 PM  Name: Ellen [name] MRN: [id number] Date of Birth: April 22, 1960

## 2020-05-06 ENCOUNTER — Encounter: Payer: Self-pay | Admitting: Physical Medicine and Rehabilitation

## 2020-06-17 IMAGING — CT CT ANGIOGRAPHY CHEST
2 of 7 series · 18 of 46 positions shown · IV contrast (APPLIED)
Comparison: Chest two-view 05/30/2018

CLINICAL DATA: Chest pain.

EXAM:
CT ANGIOGRAPHY CHEST WITH CONTRAST
TECHNIQUE: Multidetector CT imaging of the chest was performed using the
standard protocol during bolus administration of intravenous
contrast. Multiplanar CT image reconstructions and MIPs were
obtained to evaluate the vascular anatomy.
CONTRAST:  100mL OMNIPAQUE IOHEXOL 350 MG/ML SOLN

[Series 8: thins · axial · 0.73mm/px · z∈[+1169,+1349]mm · 15 of 291 slices shown]
[im 17/291  lung]
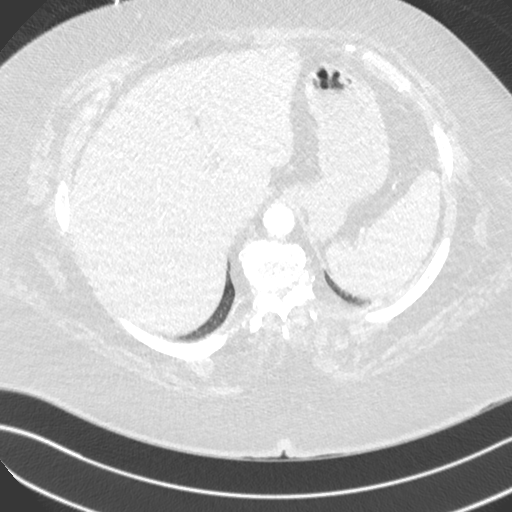
[im 33/291  soft-tissue]
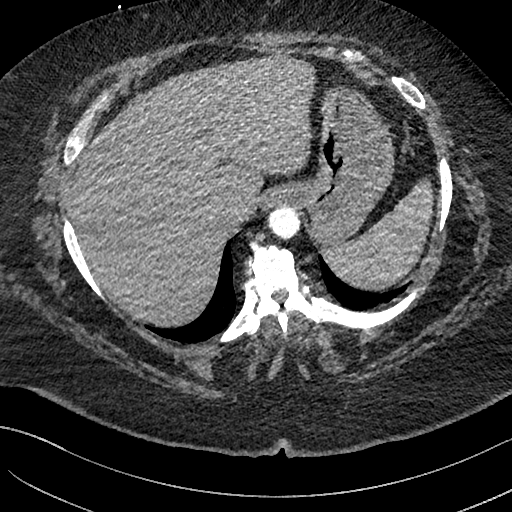
[im 49/291  lung]
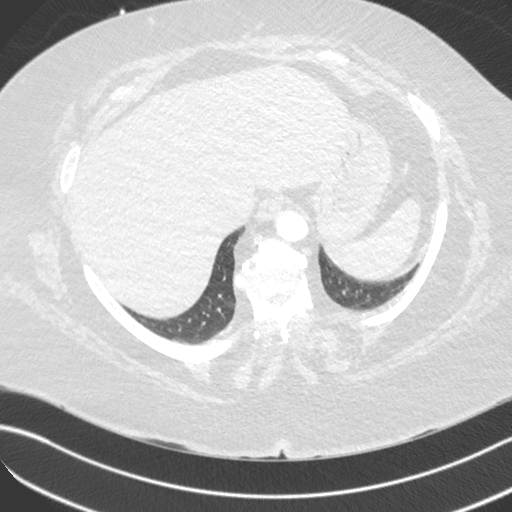
[im 65/291  soft-tissue]
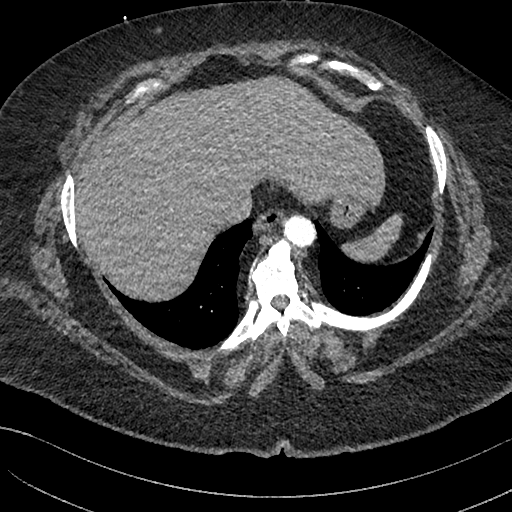
[im 97/291  lung]
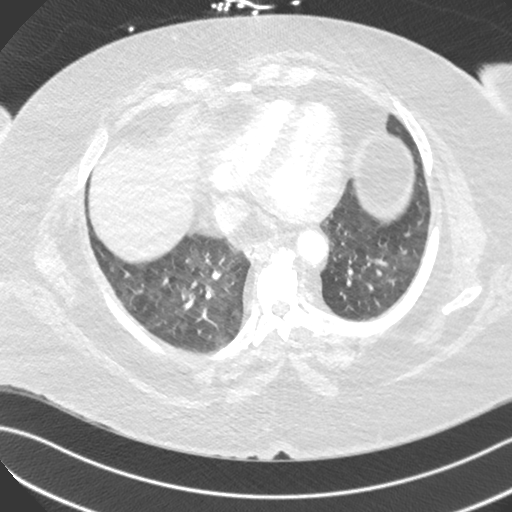
[im 113/291  soft-tissue]
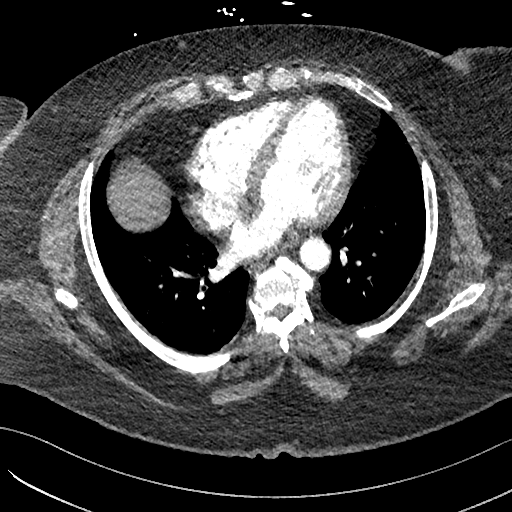
[im 129/291  lung]
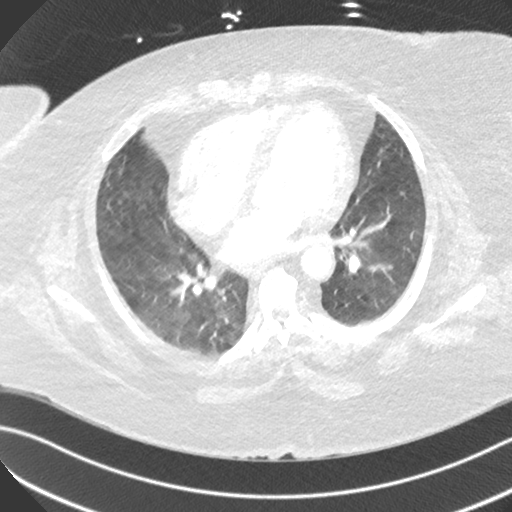
[im 146/291  soft-tissue]
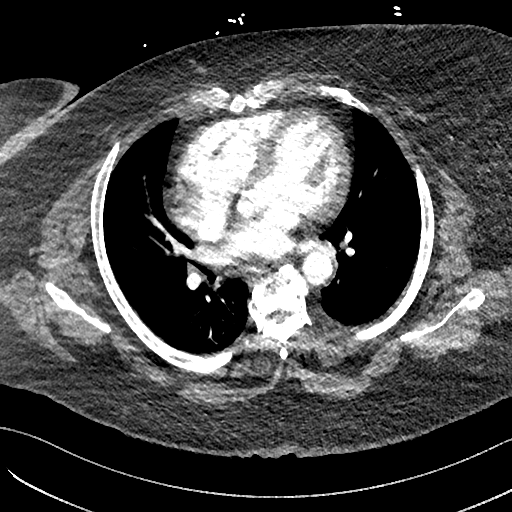
[im 162/291  lung]
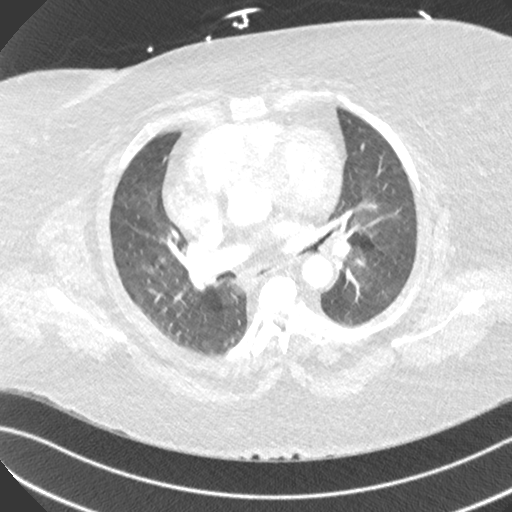
[im 178/291  soft-tissue]
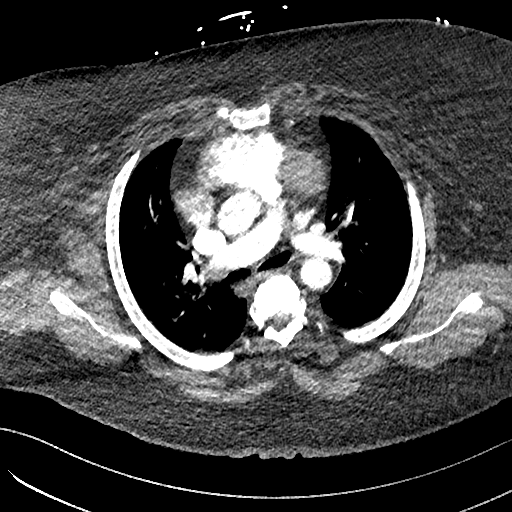
[im 194/291  lung]
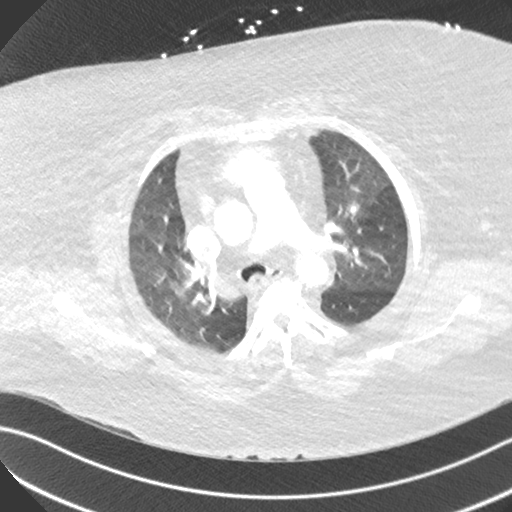
[im 226/291  soft-tissue]
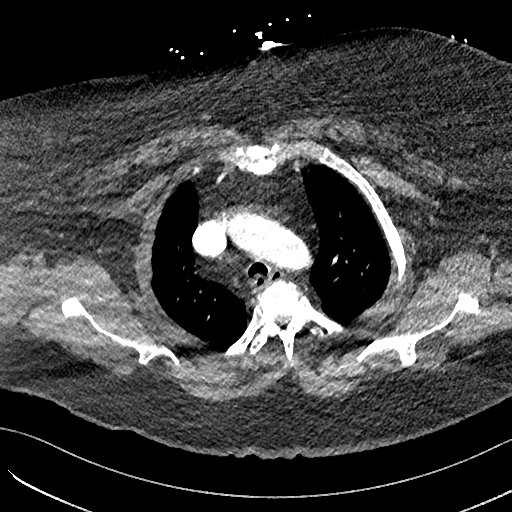
[im 242/291  lung]
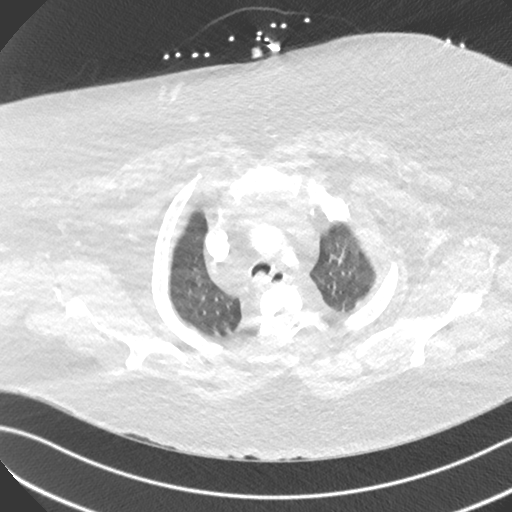
[im 258/291  soft-tissue]
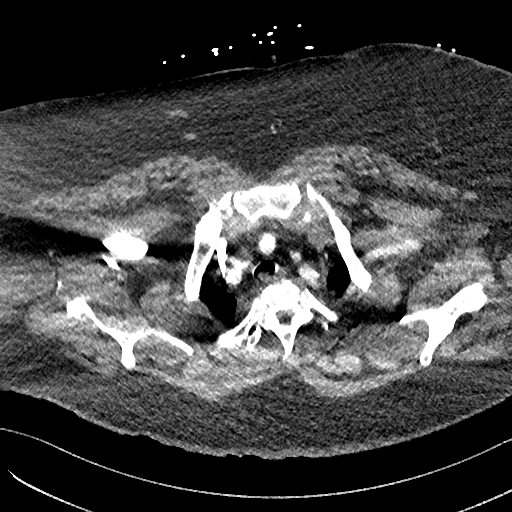
[im 274/291  lung]
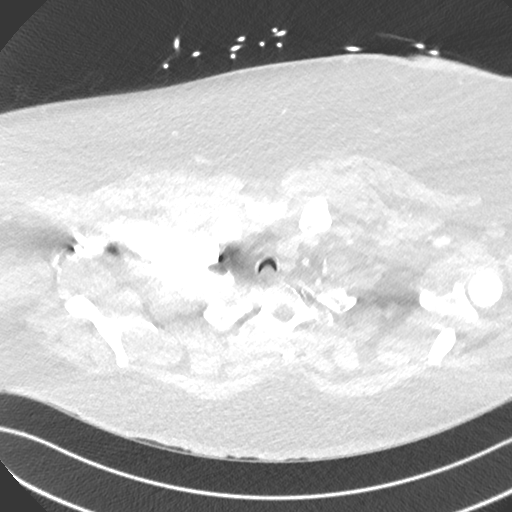

[Series 9: cor · coronal · 0.46mm/px · 3 of 151 slices shown]
[im 38/151  soft-tissue]
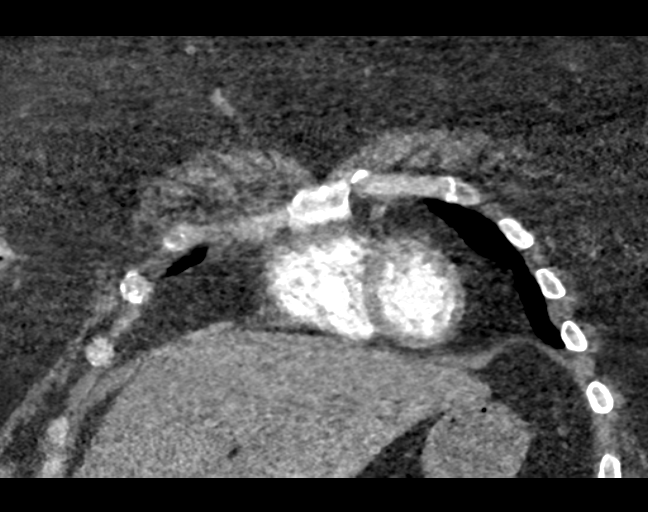
[im 76/151  soft-tissue]
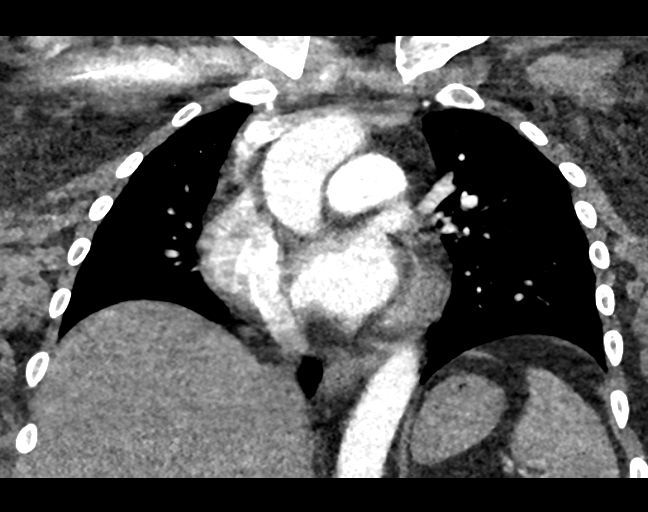
[im 113/151  soft-tissue]
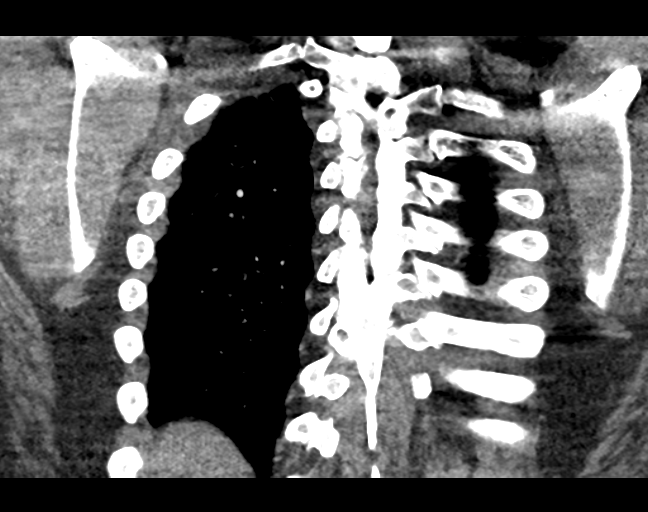

[18 of 46 positions shown; findings below may reference images not displayed]

FINDINGS: Cardiovascular: Mild cardiac enlargement. No coronary calcification.
No pericardial effusion. Negative for pulmonary embolism. Normal
thoracic aorta.

Mediastinum/Nodes: Negative for mass or adenopathy

Lungs/Pleura: Mild diffuse patchy ground-glass density throughout
both lungs, symmetrically. No focal consolidation and no effusion.

Upper Abdomen: Hepatomegaly with probable fatty infiltration. No
acute abnormality

Musculoskeletal: Thoracic disc degeneration and spurring. No acute
skeletal abnormality.

Review of the MIP images confirms the above findings.
IMPRESSION: 1. Negative for pulmonary embolism.  No acute abnormality
2. Mild patchy ground-glass density throughout both lungs may be due
to expiration. No focal consolidation.

## 2020-07-01 ENCOUNTER — Encounter: Payer: Medicare Other | Admitting: Physical Medicine and Rehabilitation

## 2020-08-27 IMAGING — DX PORTABLE CHEST - 1 VIEW
1 series · 1 of 1 positions shown · non-contrast
Comparison: 06/24/2018

CLINICAL DATA: Chest pain.

EXAM:
PORTABLE CHEST 1 VIEW

[chest ap]
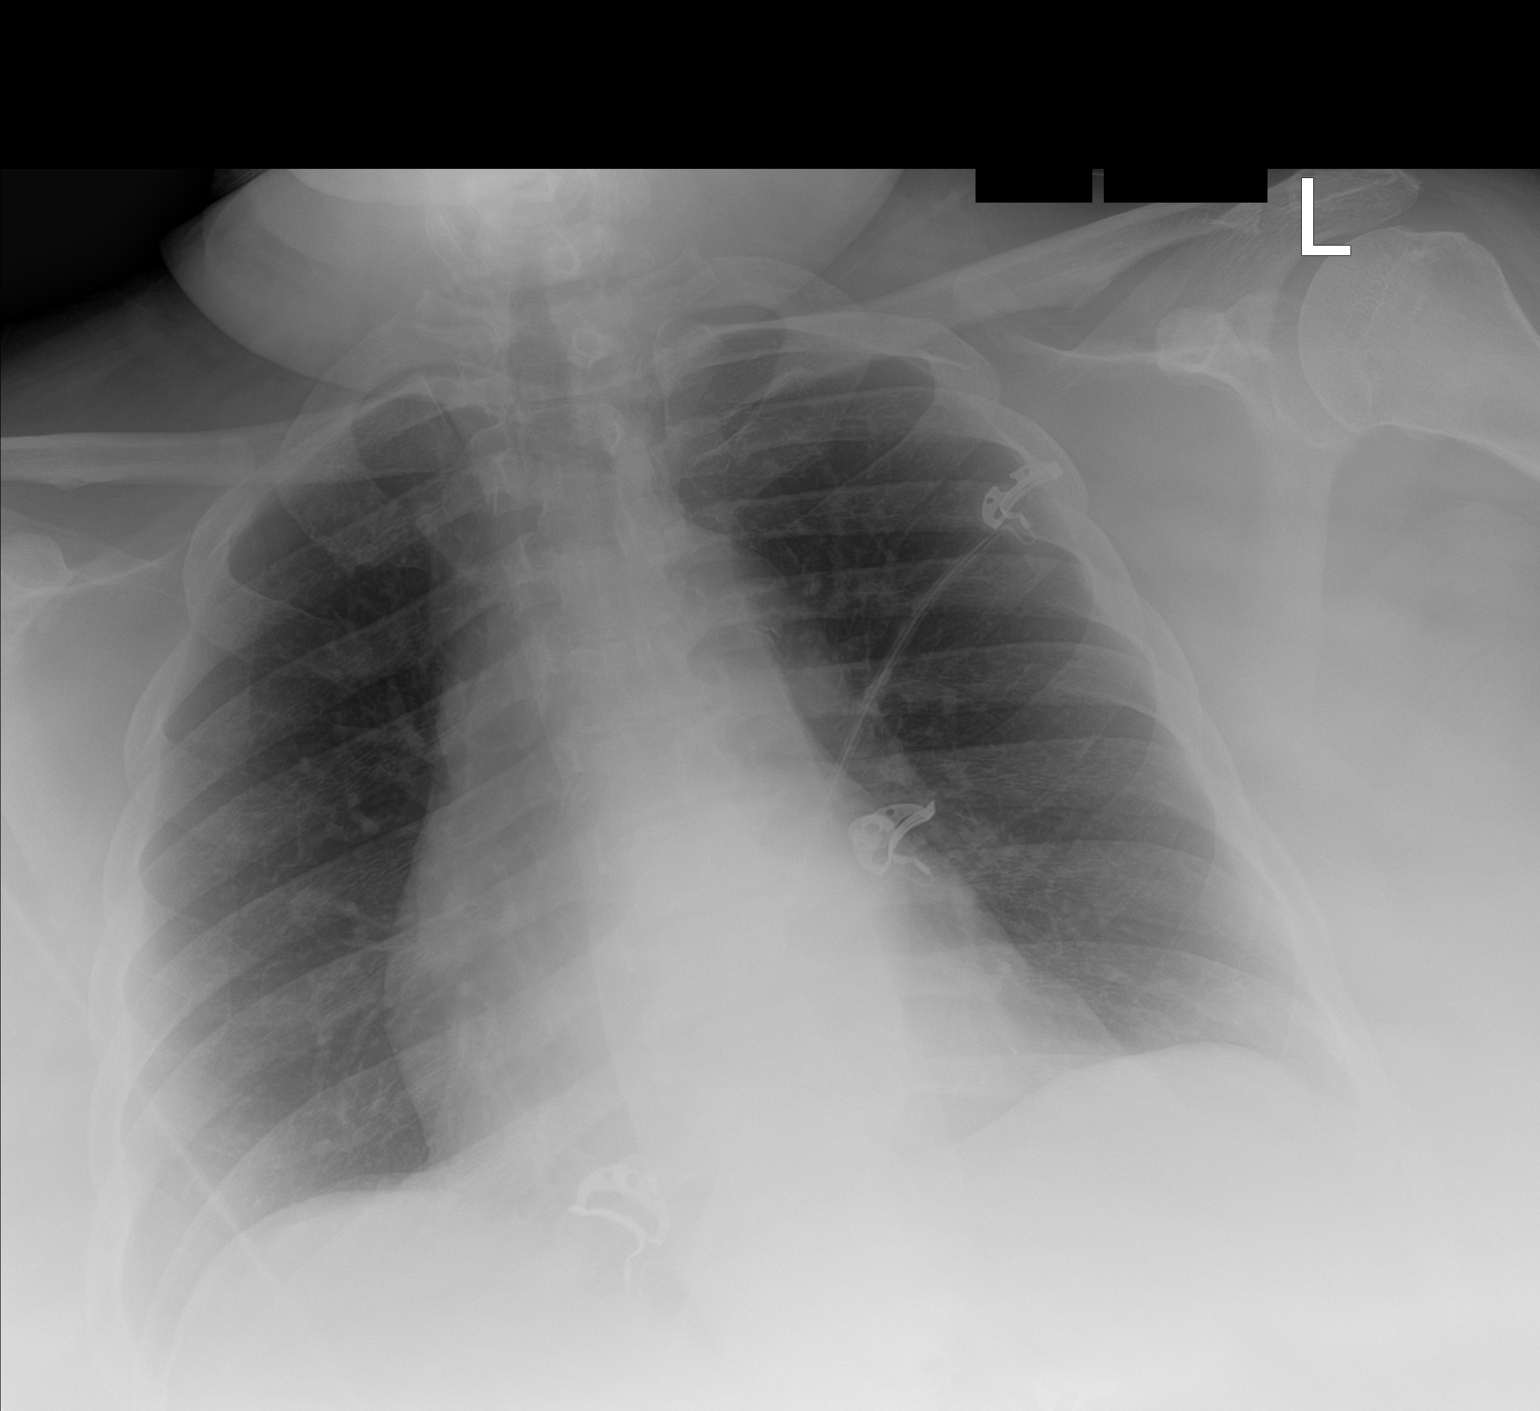

[1 of 1 positions shown; findings below may reference images not displayed]

FINDINGS: The cardiomediastinal contours are unchanged. The lungs are clear.
Pulmonary vasculature is normal. No consolidation, pleural effusion,
or pneumothorax. No acute osseous abnormalities are seen.
IMPRESSION: No acute chest findings.

## 2020-08-29 ENCOUNTER — Other Ambulatory Visit: Payer: Self-pay

## 2020-08-29 ENCOUNTER — Encounter
Payer: Medicare Other | Attending: Physical Medicine and Rehabilitation | Admitting: Physical Medicine and Rehabilitation

## 2020-08-29 ENCOUNTER — Encounter: Payer: Self-pay | Admitting: Physical Medicine and Rehabilitation

## 2020-08-29 VITALS — BP 138/79 | HR 83 | Temp 98.2°F | Ht 65.0 in | Wt 350.0 lb

## 2020-08-29 DIAGNOSIS — G8220 Paraplegia, unspecified: Secondary | ICD-10-CM | POA: Insufficient documentation

## 2020-08-29 DIAGNOSIS — R252 Cramp and spasm: Secondary | ICD-10-CM | POA: Insufficient documentation

## 2020-08-29 DIAGNOSIS — N319 Neuromuscular dysfunction of bladder, unspecified: Secondary | ICD-10-CM | POA: Insufficient documentation

## 2020-08-29 DIAGNOSIS — Z993 Dependence on wheelchair: Secondary | ICD-10-CM | POA: Diagnosis present

## 2020-08-29 MED ORDER — BACLOFEN 10 MG PO TABS: 15.0000 mg | ORAL_TABLET | Freq: Three times a day (TID) | ORAL | 2 refills | Status: DC

## 2020-08-29 MED ORDER — PREGABALIN 200 MG PO CAPS: 200.0000 mg | ORAL_CAPSULE | Freq: Two times a day (BID) | ORAL | 5 refills | Status: AC

## 2020-08-29 NOTE — Patient Instructions (Signed)
Pt is a 60 yr old female with hx of Paraplegia, morbid obesity- with BMI of 58, depression, HTN, spasticity- on Baclofen, Neurogenic bowel and bladder- had MASD under L arm- has healed.   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  Here for evaluation of SCI and chronic nerve pain.    Neurogenic bladder- refer to Urology- Pt is a paraplegic pt that "cannot hold her urine"- even 2 minutes- never had SCI rehab- so don't know if retaining- was also told retains urine in acute hospital during UTIs admission-- and "pees every 2 minutes" per pt- also has BMI of 58, FYI-  Don't increase more than 1 medicine at a time- you decide- what medicine, but don't start making changes on other medicine, until done with first medicine. Will reduce chance of side effects- and will know what medicine causing the problem.  Increase baclofen to 15 mg 3x/day x 3-4 days, then 20 mg 3x/day  x 3-4 days, then increase night time dose to 40 mg- so therefore 20 mg in AM, 20 mg in afternoon and 40 mg at night- and let me know how spasms are going-for SPASTICITY  Will increase Lyrica to 200 mg capsules- 2x/day x 1 week, then 200 mg 3x/day- for nerve pain. Will look at changing her Lexapro to Cymbalta/Duloxetine that also helps with nerve pain in future.  At 6 week mark- CALL me or my chart me to let me know how things are going, or call earlier if there's side effects.  Will wait to go over bowel program til next visit- will discuss then.  Have Fiance' come to next appointment to discuss these other issues.  F/U in 3 months for double appointment. To educate on bowel program and skin issues.  Use cushion as much as possible and do pressure relief- turning to one side or another every 20 minutes to help prevent getting skin breakdown, pressure ulcers. Or tilt in space- for 1 minutes every 20 minutes.

## 2020-08-29 NOTE — Progress Notes (Signed)
Subjective:    Patient ID: Ellen [name], female    DOB: [birth year], 60 y.o.   MRN: [id number]  HPI  Pt is a 60 yr old female with hx of Paraplegia, morbid obesity- with BMI of 58, depression, HTN, spasticity- on Baclofen,  Here for evaluation of SCI and chronic nerve pain.     Had surgery on low back-  since then, hasn't been able to walk (was walking a little prior) 07/2018- went to Rehab at nursing home- never had SCI inpt rehab.  Had back surgery for low back pain and falling- was Neurosurgeon- doesn't remember their name.   Not able to stand, still   Has power w/c- not using today.  Got w/c from [employer] Sitting in manual w/c- with NO CUSHION.  PCP Dr Doreen Beam- wrote for power w/c.   Last outpt PT was 12/21.    Pain- legs and feet- burning, tingling and on fire and legs won't "be still"- jumping a lot on its own.    Tried: Tried Tramadol- didn't help a lot.  Also has had Oxycodone-  when went into Pain mgmt- hasn't gone back to them for a long time- thought couldn't pee for them.   Kidney function is well- is on Lyrica 100 mg TID. Hasn't been titrated up in past-  Never tried any other medicine.   Off Celebrex-  Also on baclofen 10 mg TID On Lexapro for depression.   Can't hold it when needs to pee- Has seen Urology- Dr Chong Sicilian in [facility]-   Spasms- baclofen cools spasms down a little in AM, but not really the rest of day.   Bowel- can feel that she goes, but no control- has never been on a bowel program.    Doesn't use cushion in manual w/c, only in power w/c.  Just got power w/c- it's brand new. Already broken.    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 General activity 0 Relation with others 0 Enjoyment of  life 0 What TIME of day is your pain at its worst? night Sleep (in general) Poor  Pain is worse with: sitting and inactivity Pain improves with: heat/ice, therapy/exercise, and pacing activities Relief from Meds:  na  how many minutes can you walk? 0 ability to climb steps?  no do you drive?  no use a wheelchair needs help with transfers  disabled: date disabled . I need assistance with the following:  dressing, bathing, toileting, meal prep, household duties, and shopping  bladder control problems bowel control problems weakness numbness tremor tingling trouble walking spasms  New pt  New pt    Family History  Problem Relation Age of Onset   Heart disease Mother    Diabetes Mother    Cancer Father    Social History   Socioeconomic History   Marital status: Single    Spouse name: Not on file   Number of children: Not on file   Years of education: Not on file   Highest education level: Not on file  Occupational History   Not on file  Tobacco Use   Smoking status: Never   Smokeless tobacco: Never  Vaping Use   Vaping Use: Never used  Substance and Sexual  Activity   Alcohol use: No    Alcohol/week: 0.0 standard drinks   Drug use: No   Sexual activity: Not on file  Other Topics Concern   Not on file  Social History Narrative   Occupation: unemployed   single   Social Determinants of Health   Financial Resource Strain: Not on file  Food Insecurity: Not on file  Transportation Needs: Not on file  Physical Activity: Not on file  Stress: Not on file  Social Connections: Not on file   Past Surgical History:  Procedure Laterality Date   [place] LAMINECTOMY/DECOMPRESSION MICRODISCECTOMY N/A 06/19/2018   Procedure: THORACIC TEN-THORACIC [place];  Surgeon: Consuella Lose, MD;  Location: [facility];  Service: Neurosurgery;  Laterality: N/A;  posterior   resection of lipoma     UMBILICAL  HERNIA REPAIR     Past Medical History:  Diagnosis Date   Bronchitis    hx of   Degenerative joint disease    Depression    ENDOMETRIAL POLYP 01/08/2006   Dr. Agnes Lawrence.  2001:  D&C, benign;  07/14/10:  D&C, hysteroscopy, polypectomy and bx of mons pubis (peau d'orange)...... 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   GERD (gastroesophageal reflux disease)    Hernia    High cholesterol    Hypertension    Lipoma    Obesity    Sleep apnea    uses cpap   BP 138/79 (BP Location: Right Arm, Patient Position: Sitting, Cuff Size: Large)   Pulse 83   Temp 98.2 F (36.8 C) (Oral)   Ht 5\' 5"  (1.651 m) Comment: pt reported, in wheelchair  Wt (!) 350 lb (158.8 kg) Comment: pt reported, in wheelchair  LMP 07/11/2014   SpO2 95%   BMI 58.24 kg/m   Opioid Risk Score:   Fall Risk Score:  `1  Depression screen PHQ 2/9  Depression screen PHQ 2/9 08/29/2020  Decreased Interest 1  Down, Depressed, Hopeless 0  PHQ - 2 Score 1  Altered sleeping 0  Tired, decreased energy 0  Change in appetite 0  Feeling bad or failure about yourself  0  Trouble concentrating 0  Moving slowly or fidgety/restless 0  Suicidal thoughts 0  PHQ-9 Score 1  Difficult doing work/chores Not difficult at all      Review of Systems  Musculoskeletal:  Positive for gait problem.       Leg pain-bilateral spasms  Neurological:  Positive for tremors, weakness and numbness.  All other systems reviewed and are negative.     Objective:   Physical Exam Awake, alert, appropriate, sweet, interactive, sitting with NO cushion in manual w/c, NAD MS: UE strength 5-/5 B/L in biceps, triceps, WE, grip and FA LE's- HF 2-/5 on R; 1/5 on L; KE 2/5 on R 2-/5 on L; DF 2/5 B/L; PF2/5 B/L  Neuro: Sensation to light touch is normal in all 4 extremities and torso.  Multiple spasms set off by simple ROM/touch- MAS of 1+ to 2 in Les- esp in hips/knees- no clonus B/L      Assessment & Plan:    Pt is a 60 yr old female with hx of Paraplegia, morbid obesity- with BMI of 58, depression, HTN, spasticity- on Baclofen, Neurogenic bowel and bladder- had MASD under L arm- has healed.   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  Here for evaluation of SCI and chronic nerve pain.    Neurogenic bladder- refer to Urology- Pt is a paraplegic pt that "cannot hold her urine"- even 2 minutes- never had SCI rehab- so don't know if retaining- was also told retains urine in acute hospital during UTIs admission-- and "pees every 2 minutes" per pt- also has BMI of 58, FYI-  Don't increase more than 1 medicine at a time- you decide- what medicine, but don't start making changes on other medicine, until done with first medicine. Will reduce chance of side effects- and will know what medicine causing the problem.  Increase baclofen to 15 mg 3x/day x 3-4 days, then 20 mg 3x/day  x 3-4 days, then increase night time dose to 40 mg- so therefore 20 mg in AM, 20 mg in afternoon and 40 mg at night- and let me know how spasms are going-for SPASTICITY  Will increase Lyrica to 200 mg capsules- 2x/day x 1 week, then 200 mg 3x/day- for nerve pain. Will look at changing her Lexapro to Cymbalta/Duloxetine that also helps with nerve pain in future.  At 6 week mark- CALL me or my chart me to let me know how things are going, or call earlier if there's side effects.  Will wait to go over bowel program til next visit- will discuss then.  Have Fiance' come to next appointment to discuss these other issues.  F/U in 3 months for double appointment. To educate on bowel program and skin issues.  Use cushion as much as possible and do pressure relief- turning to one side or another every 20 minutes to help prevent getting skin breakdown, pressure ulcers. Or tilt in space- for 1 minutes every 20 minutes.    I spent a total of 50 minutes on visit- as detailed above- will go over  diagnosis/ASIA exam, as well as bowels at next visit- went over skin, bladder spasticity and nerve pain today.

## 2020-09-19 ENCOUNTER — Other Ambulatory Visit: Payer: Self-pay | Admitting: Physical Medicine and Rehabilitation

## 2020-11-28 ENCOUNTER — Encounter
Payer: Medicare Other | Attending: Physical Medicine and Rehabilitation | Admitting: Physical Medicine and Rehabilitation

## 2020-11-28 DIAGNOSIS — Z993 Dependence on wheelchair: Secondary | ICD-10-CM | POA: Insufficient documentation

## 2020-11-28 DIAGNOSIS — N319 Neuromuscular dysfunction of bladder, unspecified: Secondary | ICD-10-CM | POA: Insufficient documentation

## 2020-11-28 DIAGNOSIS — R252 Cramp and spasm: Secondary | ICD-10-CM | POA: Insufficient documentation

## 2020-11-28 DIAGNOSIS — G8220 Paraplegia, unspecified: Secondary | ICD-10-CM | POA: Insufficient documentation

## 2020-12-04 ENCOUNTER — Other Ambulatory Visit: Payer: Self-pay | Admitting: Physical Medicine and Rehabilitation

## 2021-02-24 ENCOUNTER — Other Ambulatory Visit: Payer: Self-pay | Admitting: Physical Medicine and Rehabilitation

## 2021-02-27 NOTE — Telephone Encounter (Signed)
Last appointment 08/29/2020. No show on 11/28/20.  Today patient has been advised reschedule follow up appointment.   Please refill or advised.

## 2021-04-04 ENCOUNTER — Other Ambulatory Visit: Payer: Self-pay | Admitting: Physical Medicine and Rehabilitation
